# Patient Record
Sex: Male | Born: 1975 | Race: Black or African American | Hispanic: No | Marital: Married | Smoking: Current every day smoker
Health system: Southern US, Community
[De-identification: ages and names within clinical notes are randomized; demographics above are authoritative.]

## PROBLEM LIST (undated history)

## (undated) ENCOUNTER — Emergency Department (HOSPITAL_COMMUNITY): Payer: Medicaid Other

## (undated) ENCOUNTER — Emergency Department (HOSPITAL_COMMUNITY): Admission: EM | Payer: Medicaid Other

## (undated) DIAGNOSIS — F32A Depression, unspecified: Secondary | ICD-10-CM

## (undated) DIAGNOSIS — L97509 Non-pressure chronic ulcer of other part of unspecified foot with unspecified severity: Secondary | ICD-10-CM

## (undated) HISTORY — DX: Non-pressure chronic ulcer of other part of unspecified foot with unspecified severity: L97.509

## (undated) HISTORY — PX: FOOT SURGERY: SHX648

## (undated) HISTORY — DX: Depression, unspecified: F32.A

---

## 2020-07-02 DIAGNOSIS — Z419 Encounter for procedure for purposes other than remedying health state, unspecified: Secondary | ICD-10-CM | POA: Diagnosis not present

## 2020-08-02 DIAGNOSIS — Z419 Encounter for procedure for purposes other than remedying health state, unspecified: Secondary | ICD-10-CM | POA: Diagnosis not present

## 2020-08-28 DIAGNOSIS — Z111 Encounter for screening for respiratory tuberculosis: Secondary | ICD-10-CM | POA: Diagnosis not present

## 2020-08-28 DIAGNOSIS — Z049 Encounter for examination and observation for unspecified reason: Secondary | ICD-10-CM | POA: Diagnosis not present

## 2020-08-28 DIAGNOSIS — Z23 Encounter for immunization: Secondary | ICD-10-CM | POA: Diagnosis not present

## 2020-08-28 DIAGNOSIS — Z114 Encounter for screening for human immunodeficiency virus [HIV]: Secondary | ICD-10-CM | POA: Diagnosis not present

## 2020-09-02 DIAGNOSIS — Z419 Encounter for procedure for purposes other than remedying health state, unspecified: Secondary | ICD-10-CM | POA: Diagnosis not present

## 2020-09-10 DIAGNOSIS — Z130 Encounter for screening for diseases of the blood and blood-forming organs and certain disorders involving the immune mechanism: Secondary | ICD-10-CM | POA: Diagnosis not present

## 2020-09-10 DIAGNOSIS — Z131 Encounter for screening for diabetes mellitus: Secondary | ICD-10-CM | POA: Diagnosis not present

## 2020-09-10 DIAGNOSIS — Z0189 Encounter for other specified special examinations: Secondary | ICD-10-CM | POA: Diagnosis not present

## 2020-09-10 DIAGNOSIS — Z1331 Encounter for screening for depression: Secondary | ICD-10-CM | POA: Diagnosis not present

## 2020-09-10 DIAGNOSIS — L97521 Non-pressure chronic ulcer of other part of left foot limited to breakdown of skin: Secondary | ICD-10-CM | POA: Diagnosis not present

## 2020-09-10 DIAGNOSIS — Z1329 Encounter for screening for other suspected endocrine disorder: Secondary | ICD-10-CM | POA: Diagnosis not present

## 2020-09-10 DIAGNOSIS — Z13220 Encounter for screening for lipoid disorders: Secondary | ICD-10-CM | POA: Diagnosis not present

## 2020-09-10 DIAGNOSIS — Z655 Exposure to disaster, war and other hostilities: Secondary | ICD-10-CM | POA: Diagnosis not present

## 2020-09-10 DIAGNOSIS — Z111 Encounter for screening for respiratory tuberculosis: Secondary | ICD-10-CM | POA: Diagnosis not present

## 2020-09-10 DIAGNOSIS — K2950 Unspecified chronic gastritis without bleeding: Secondary | ICD-10-CM | POA: Diagnosis not present

## 2020-09-30 DIAGNOSIS — Z419 Encounter for procedure for purposes other than remedying health state, unspecified: Secondary | ICD-10-CM | POA: Diagnosis not present

## 2020-10-14 DIAGNOSIS — Z111 Encounter for screening for respiratory tuberculosis: Secondary | ICD-10-CM | POA: Diagnosis not present

## 2020-10-14 DIAGNOSIS — L97521 Non-pressure chronic ulcer of other part of left foot limited to breakdown of skin: Secondary | ICD-10-CM | POA: Diagnosis not present

## 2020-10-14 DIAGNOSIS — R079 Chest pain, unspecified: Secondary | ICD-10-CM | POA: Diagnosis not present

## 2020-10-14 DIAGNOSIS — Z655 Exposure to disaster, war and other hostilities: Secondary | ICD-10-CM | POA: Diagnosis not present

## 2020-10-14 DIAGNOSIS — Z0001 Encounter for general adult medical examination with abnormal findings: Secondary | ICD-10-CM | POA: Diagnosis not present

## 2020-10-31 DIAGNOSIS — Z419 Encounter for procedure for purposes other than remedying health state, unspecified: Secondary | ICD-10-CM | POA: Diagnosis not present

## 2020-11-03 ENCOUNTER — Other Ambulatory Visit: Payer: Self-pay | Admitting: Obstetrics and Gynecology

## 2020-11-03 ENCOUNTER — Ambulatory Visit
Admission: RE | Admit: 2020-11-03 | Discharge: 2020-11-03 | Disposition: A | Discharge status: Home / Self Care | Payer: Self-pay | Source: Ambulatory Visit | Attending: Obstetrics and Gynecology | Admitting: Obstetrics and Gynecology

## 2020-11-03 ENCOUNTER — Other Ambulatory Visit: Payer: Self-pay

## 2020-11-03 DIAGNOSIS — R7612 Nonspecific reaction to cell mediated immunity measurement of gamma interferon antigen response without active tuberculosis: Secondary | ICD-10-CM

## 2020-11-24 ENCOUNTER — Ambulatory Visit
Admission: RE | Admit: 2020-11-24 | Discharge: 2020-11-24 | Disposition: A | Discharge status: Home / Self Care | Payer: No Typology Code available for payment source | Source: Ambulatory Visit | Attending: Obstetrics and Gynecology | Admitting: Obstetrics and Gynecology

## 2020-11-24 ENCOUNTER — Other Ambulatory Visit: Payer: Self-pay | Admitting: Obstetrics and Gynecology

## 2020-11-24 DIAGNOSIS — R7611 Nonspecific reaction to tuberculin skin test without active tuberculosis: Secondary | ICD-10-CM

## 2020-11-30 DIAGNOSIS — Z419 Encounter for procedure for purposes other than remedying health state, unspecified: Secondary | ICD-10-CM | POA: Diagnosis not present

## 2020-12-02 ENCOUNTER — Other Ambulatory Visit: Payer: Self-pay

## 2020-12-02 ENCOUNTER — Encounter (HOSPITAL_COMMUNITY): Payer: Self-pay | Admitting: Emergency Medicine

## 2020-12-02 ENCOUNTER — Ambulatory Visit (HOSPITAL_COMMUNITY)
Admission: EM | Admit: 2020-12-02 | Discharge: 2020-12-02 | Disposition: A | Discharge status: Home / Self Care | Payer: Medicaid Other | Attending: Medical Oncology | Admitting: Medical Oncology

## 2020-12-02 DIAGNOSIS — R1010 Upper abdominal pain, unspecified: Secondary | ICD-10-CM

## 2020-12-02 DIAGNOSIS — R519 Headache, unspecified: Secondary | ICD-10-CM

## 2020-12-02 DIAGNOSIS — R112 Nausea with vomiting, unspecified: Secondary | ICD-10-CM | POA: Diagnosis not present

## 2020-12-02 LAB — POCT URINALYSIS DIPSTICK, ED / UC
Bilirubin Urine: NEGATIVE
Glucose, UA: NEGATIVE mg/dL
Ketones, ur: NEGATIVE mg/dL
Nitrite: NEGATIVE
Protein, ur: 30 mg/dL — AB
Specific Gravity, Urine: 1.015 (ref 1.005–1.030)
Urobilinogen, UA: 0.2 mg/dL (ref 0.0–1.0)
pH: 7 (ref 5.0–8.0)

## 2020-12-02 MED ORDER — ONDANSETRON 4 MG PO TBDP: 4.0000 mg | ORAL_TABLET | Freq: Three times a day (TID) | ORAL | 0 refills | Status: DC | PRN

## 2020-12-02 NOTE — Discharge Instructions (Addendum)
Phillipe : [id number]

## 2020-12-02 NOTE — ED Provider Notes (Signed)
[facility]    CSN: [id number] Arrival date & time: 12/02/20  1214      History   Chief Complaint Chief Complaint  Patient presents with  . Abdominal Pain    HPI [name] is a 45 y.o. male.   HPI Interpretor Phillipe : [id number] assisted today in our visit. Poor historian despite translator use. Oriented x 4  Abdominal Pain: Pt states that for the past 5 days they have had vomiting, headache and epigastric abdominal pain.  He reports that the abdominal pain and vomiting occurs when he tries to eat food.  Any food or liquids that he tries to intake he vomits.  He is unable to give me an exact number of how many times he has vomited since the start of symptoms.  No bloody emesis. He states that he has not had anything to eat so he has not had any bowel movements during this time.  He has had a headache but denies any visual changes or neck stiffness and he has not had any fevers.  He has not tried anything for symptoms other than avoiding juices which make his vomiting worse.   History reviewed. No pertinent past medical history.  There are no problems to display for this patient.   Past Surgical History:  Procedure Laterality Date  . FOOT SURGERY         Home Medications    Prior to Admission medications   Not on File    Family History History reviewed. No pertinent family history.  Social History Social History   Tobacco Use  . Smoking status: Current Every Day Smoker    Types: Cigarettes  . Smokeless tobacco: Never Used  Vaping Use  . Vaping Use: Never used  Substance Use Topics  . Alcohol use: Never  . Drug use: Never     Allergies   Patient has no known allergies.   Review of Systems Review of Systems  As stated above in HPI Physical Exam Triage Vital Signs ED Triage Vitals  Enc Vitals Group     BP 12/02/20 1321 111/67     Pulse Rate 12/02/20 1321 80     Resp 12/02/20 1321 18     Temp 12/02/20 1321 98 F (36.7 C)      Temp Source 12/02/20 1321 Oral     SpO2 12/02/20 1321 100 %     Weight --      Height --      Head Circumference --      Peak Flow --      Pain Score 12/02/20 1326 10     Pain Loc --      Pain Edu? --      Excl. in GC? --    No data found.  Updated Vital Signs BP 111/67 (BP Location: Right Arm)   Pulse 80   Temp 98 F (36.7 C) (Oral)   Resp 18   SpO2 100%   Physical Exam Vitals and nursing note reviewed.  Constitutional:      General: He is not in acute distress.    Appearance: He is well-developed. He is not ill-appearing, toxic-appearing or diaphoretic.  HENT:     Head: Normocephalic and atraumatic.  Eyes:     Extraocular Movements: Extraocular movements intact.     Pupils: Pupils are equal, round, and reactive to light.  Cardiovascular:     Rate and Rhythm: Normal rate and regular rhythm.     Heart sounds: Normal heart  sounds.  Pulmonary:     Effort: Pulmonary effort is normal.     Breath sounds: Normal breath sounds.  Abdominal:     General: Abdomen is flat. Bowel sounds are normal. There is no distension or abdominal bruit.     Palpations: Abdomen is soft. There is no shifting dullness, hepatomegaly, splenomegaly, mass or pulsatile mass.     Tenderness: There is no abdominal tenderness.     Hernia: No hernia is present.  Skin:    Coloration: Skin is not jaundiced.  Neurological:     Mental Status: He is alert and oriented to person, place, and time.     Motor: No weakness.  Psychiatric:        Mood and Affect: Mood normal.        Behavior: Behavior normal.      UC Treatments / Results  Labs (all labs ordered are listed, but only abnormal results are displayed) Labs Reviewed  POCT URINALYSIS DIPSTICK, ED / UC - Abnormal; Notable for the following components:      Result Value   Hgb urine dipstick MODERATE (*)    Protein, ur 30 (*)    Leukocytes,Ua TRACE (*)    All other components within normal limits    EKG   Radiology No results  found.  Procedures Procedures (including critical care time)  Medications Ordered in UC Medications - No data to display  Initial Impression / Assessment and Plan / UC Course  I have reviewed the triage vital signs and the nursing notes.  Pertinent labs & imaging results that were available during my care of the patient were reviewed by me and considered in my medical decision making (see chart for details).      New.  I discussed my concern with his symptoms.  We have elected to trial Zofran and hydration with water to see if this would help with symptoms.  We discussed that should this fail to improve his symptoms fully he will need to go to the emergency room. At the end of our visit he asks about where he can get evaluated for foot pain that has been chronic for years. I have recommended a podiatrist.  Final Clinical Impressions(s) / UC Diagnoses   Final diagnoses:  None   Discharge Instructions   None    ED Prescriptions    None     PDMP not reviewed this encounter.   Rushie Chestnut, [place] 12/02/20 1455

## 2020-12-02 NOTE — ED Triage Notes (Signed)
complains of vomiting , headache and abdominal pain.  Symptoms started friday

## 2020-12-23 DIAGNOSIS — M79672 Pain in left foot: Secondary | ICD-10-CM | POA: Diagnosis not present

## 2020-12-23 DIAGNOSIS — Z789 Other specified health status: Secondary | ICD-10-CM | POA: Diagnosis not present

## 2020-12-23 DIAGNOSIS — L97521 Non-pressure chronic ulcer of other part of left foot limited to breakdown of skin: Secondary | ICD-10-CM | POA: Diagnosis not present

## 2020-12-31 DIAGNOSIS — Z419 Encounter for procedure for purposes other than remedying health state, unspecified: Secondary | ICD-10-CM | POA: Diagnosis not present

## 2021-01-29 ENCOUNTER — Ambulatory Visit (INDEPENDENT_AMBULATORY_CARE_PROVIDER_SITE_OTHER): Payer: Medicaid Other

## 2021-01-29 ENCOUNTER — Ambulatory Visit (HOSPITAL_COMMUNITY)
Admission: EM | Admit: 2021-01-29 | Discharge: 2021-01-29 | Disposition: A | Discharge status: Home / Self Care | Payer: Medicaid Other | Attending: Urgent Care | Admitting: Urgent Care

## 2021-01-29 ENCOUNTER — Other Ambulatory Visit: Payer: Self-pay

## 2021-01-29 ENCOUNTER — Encounter (HOSPITAL_COMMUNITY): Payer: Self-pay

## 2021-01-29 DIAGNOSIS — M25572 Pain in left ankle and joints of left foot: Secondary | ICD-10-CM

## 2021-01-29 DIAGNOSIS — S91002A Unspecified open wound, left ankle, initial encounter: Secondary | ICD-10-CM | POA: Diagnosis not present

## 2021-01-29 DIAGNOSIS — M7989 Other specified soft tissue disorders: Secondary | ICD-10-CM | POA: Diagnosis not present

## 2021-01-29 DIAGNOSIS — M79672 Pain in left foot: Secondary | ICD-10-CM

## 2021-01-29 MED ORDER — NAPROXEN 375 MG PO TABS: 375.0000 mg | ORAL_TABLET | Freq: Two times a day (BID) | ORAL | 0 refills | Status: DC

## 2021-01-29 MED ORDER — BACITRACIN ZINC 500 UNIT/GM EX OINT: 1.0000 "application " | TOPICAL_OINTMENT | Freq: Two times a day (BID) | CUTANEOUS | 0 refills | Status: DC
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
## 2021-01-29 NOTE — ED Triage Notes (Signed)
Pt presents with recurring left foot & ankle swelling, pain, and wound draining X 4 months.

## 2021-01-29 NOTE — Discharge Instructions (Addendum)
Please change your dressing 2-3 times daily. Use non-stick gauze (non-adherent gauze). Alternate between wet (applying the Bacitracin ointment) and dry dressings (no ointments). Each time you change your dressing, make sure you clean gently around the perimeter of the wound and the wound itself with gentle soap and warm water. Dial antibacterial soap is okay to use. Pat your wound dry and let it air out if possible for 1-2 hours before reapplying another dressing. Once your wound scabs over completely, you do not need to keep applying dressings.

## 2021-01-30 DIAGNOSIS — Z419 Encounter for procedure for purposes other than remedying health state, unspecified: Secondary | ICD-10-CM | POA: Diagnosis not present

## 2021-02-05 ENCOUNTER — Ambulatory Visit (HOSPITAL_COMMUNITY)
Admission: RE | Admit: 2021-02-05 | Discharge: 2021-02-05 | Disposition: A | Discharge status: Home / Self Care | Payer: Medicaid Other | Source: Ambulatory Visit | Attending: Internal Medicine | Admitting: Internal Medicine

## 2021-02-05 ENCOUNTER — Encounter (HOSPITAL_BASED_OUTPATIENT_CLINIC_OR_DEPARTMENT_OTHER): Payer: Medicaid Other | Attending: Internal Medicine | Admitting: Internal Medicine

## 2021-02-05 ENCOUNTER — Other Ambulatory Visit (HOSPITAL_COMMUNITY)
Admission: RE | Admit: 2021-02-05 | Discharge: 2021-02-05 | Disposition: A | Discharge status: Home / Self Care | Payer: Medicaid Other | Attending: Internal Medicine | Admitting: Internal Medicine

## 2021-02-05 ENCOUNTER — Other Ambulatory Visit: Payer: Self-pay

## 2021-02-05 ENCOUNTER — Other Ambulatory Visit (HOSPITAL_COMMUNITY): Payer: Self-pay | Admitting: Internal Medicine

## 2021-02-05 DIAGNOSIS — L03116 Cellulitis of left lower limb: Secondary | ICD-10-CM | POA: Insufficient documentation

## 2021-02-05 DIAGNOSIS — L97328 Non-pressure chronic ulcer of left ankle with other specified severity: Secondary | ICD-10-CM | POA: Insufficient documentation

## 2021-02-05 DIAGNOSIS — I87332 Chronic venous hypertension (idiopathic) with ulcer and inflammation of left lower extremity: Secondary | ICD-10-CM | POA: Insufficient documentation

## 2021-02-05 DIAGNOSIS — R600 Localized edema: Secondary | ICD-10-CM | POA: Diagnosis not present

## 2021-02-05 DIAGNOSIS — Z872 Personal history of diseases of the skin and subcutaneous tissue: Secondary | ICD-10-CM | POA: Diagnosis not present

## 2021-02-05 DIAGNOSIS — L97322 Non-pressure chronic ulcer of left ankle with fat layer exposed: Secondary | ICD-10-CM | POA: Diagnosis not present

## 2021-02-05 DIAGNOSIS — M19072 Primary osteoarthritis, left ankle and foot: Secondary | ICD-10-CM | POA: Diagnosis not present

## 2021-02-05 NOTE — Progress Notes (Signed)
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 interpreter. I reviewed his x-ray there is no hardware. 3. I also wondered about a [place] ulcer although this seems to have worsened markedly since he was here. 4. I have cultured the wound surface and put him empirically on antibiotics. I have ordered arterial and venous Dopplers. The patient asked me about cancer although that was not my first thought here. I think this is much more likely to be an infection 5. We had a lot of trouble communicating here even through the interpreter. The patient seems to have his own fixed ideas about what should and should not be done to this wound. We use silver alginate's a tube and ABDs and  put him in 3 layer compression we will have him back for a nurse visit early next week I spent 50 minutes in review of this patient's past medical history, face-to-face evaluation and preparation of this record Electronic Signature(s) Signed: 02/05/2021 8:35:52 PM By: Baltazar Najjar MD Entered By: Baltazar Najjar on 02/05/2021 12:45:21 -------------------------------------------------------------------------------- HxROS Details Patient Name: Date of Service: [name] [name] 02/05/2021 9:00 A M Medical Record Number: [id number] Patient Account Number: [id number] Date of Birth/Sex: Treating RN: 11-01-1975 (45 y.o. Male) [name] Primary Care Provider: PCP, NO Other Clinician: Referring Provider: Treating Provider/Extender: [name] in Treatment: 0 Information Obtained From Patient Eyes Complaints and Symptoms: Negative for: Dry Eyes; Vision Changes; Glasses / Contacts Ear/Nose/Mouth/Throat Complaints and Symptoms: Negative for: Chronic sinus problems or rhinitis Respiratory Complaints and Symptoms: Negative for: Chronic or frequent coughs; Shortness of Breath Cardiovascular Complaints and Symptoms: Negative for: Chest pain Endocrine Complaints and Symptoms: Negative for: Heat/cold intolerance Genitourinary Complaints and Symptoms: Negative for: Frequent urination Integumentary (Skin) Complaints and Symptoms: Positive for: Wounds Musculoskeletal Complaints and Symptoms: Negative for: Muscle Pain; Muscle Weakness Neurologic Complaints and Symptoms: Negative for: Numbness/parasthesias Psychiatric Complaints and Symptoms: Negative for: Claustrophobia; Suicidal Hematologic/Lymphatic Gastrointestinal Medical History: Past Medical History Notes: Chronic Gastritis Immunological Oncologic Immunizations Pneumococcal Vaccine: Received Pneumococcal Vaccination: No Implantable Devices No devices added Family and Social History Unknown  History: Yes; Current every day smoker; Marital Status - Married; Alcohol Use: Rarely; Drug Use: No History; Caffeine Use: Moderate; Financial Concerns: No; Food, Clothing or Shelter Needs: No; Support System Lacking: No; Transportation Concerns: No Electronic Signature(s) Signed: 02/05/2021 5:53:02 PM By: [name] Signed: 02/05/2021 8:35:52 PM By: Baltazar Najjar MD Signed: 02/05/2021 8:35:52 PM By: Baltazar Najjar MD Entered By: [name] on 02/05/2021 10:12:05 -------------------------------------------------------------------------------- SuperBill Details Patient Name: Date of Service: [name] [name] 02/05/2021 Medical Record Number: [id number] Patient Account Number: [id number] Date of Birth/Sex: Treating RN: 1976-01-27 (45 y.o. Male) Shawn Stall Primary Care Provider: PCP, NO Other Clinician: Referring Provider: Treating Provider/Extender: [name] in Treatment: 0 Diagnosis Coding ICD-10 Codes Code Description [phone] Non-pressure chronic ulcer of left ankle with other specified severity L03.116 Cellulitis of left lower limb I87.332 Chronic venous hypertension (idiopathic) with ulcer and inflammation of left lower extremity Facility Procedures CPT4 Code: 32992426 Description: 99213 - WOUND CARE VISIT-LEV 3 EST PT Modifier: Quantity: 1 CPT4 Code: 83419622 Description: 11042 - DEB SUBQ TISSUE 20 SQ CM/< ICD-10 Diagnosis Description L97.328 Non-pressure chronic ulcer of left ankle with  other specified severity Modifier: Quantity: 1 CPT4 Code: [id number] Description: 11045 - DEB SUBQ TISS EA ADDL 20CM ICD-10 Diagnosis Description L97.328 Non-pressure chronic ulcer of left ankle with other specified severity Modifier: Quantity: 3 Physician Procedures : CPT4 Code Description Modifier [id number] 99204 - WC PHYS LEVEL 4 - NEW PT 25 ICD-10 Diagnosis Description L97.328 Non-pressure chronic ulcer of left ankle with other specified severity L03.116  Cellulitis of left lower limb I87.332 Chronic venous  hypertension (idiopathic) with ulcer and inflammation of left lower extremity Quantity: 1 : [id number] 11042 - WC PHYS SUBQ TISS 20 SQ CM ICD-10 Diagnosis Description L97.328 Non-pressure chronic ulcer of left ankle with other specified severity Quantity: 1 : [id number] 11045 - WC PHYS SUBQ TISS EA ADDL 20 CM ICD-10 Diagnosis Description L97.328 Non-pressure chronic ulcer of left ankle with other specified severity Quantity: 3 Electronic Signature(s) Signed: 02/05/2021 6:24:11 PM By: Shawn Stalleaton, Bobbi Signed: 02/05/2021 8:35:52 PM By: Baltazar Najjarobson, Yaw Escoto MD Entered By: Shawn Stalleaton, Bobbi on 02/05/2021 15:46:38

## 2021-02-05 NOTE — Progress Notes (Signed)
[name], [name] ([id number]) Visit Report for 02/05/2021 Abuse/Suicide Risk Screen Details Patient Name: Date of Service: [name] [name] [place] NIE 02/05/2021 9:00 A M Medical Record Number: [id number] Patient Account Number: [id number] Date of Birth/Sex: Treating RN: 1976/07/15 (45 y.o. Male) [name] Primary Care [name]: PCP, NO Other Clinician: Referring [name]: Treating [name]: [name] in Treatment: 0 Abuse/Suicide Risk Screen Items Answer ABUSE RISK SCREEN: Has anyone close to you tried to hurt or harm you recentlyo No Do you feel uncomfortable with anyone in your familyo No Has anyone forced you do things that you didnt want to doo No Electronic Signature(s) Signed: 02/05/2021 5:53:02 PM By: [name] Entered By: [name] on 02/05/2021 10:02:27 -------------------------------------------------------------------------------- Activities of Daily Living Details Patient Name: Date of Service: [name] [name] [name] 02/05/2021 9:00 A M Medical Record Number: [id number] Patient Account Number: [id number] Date of Birth/Sex: Treating RN: [birth year] (45 y.o. Male) [name] Primary Care [name]: PCP, NO Other Clinician: Referring [name]: Treating [name]/Extender: [name] in Treatment: 0 Activities of Daily Living Items Answer Activities of Daily Living (Please select one for each item) Drive Automobile Not Able T Medications ake Completely Able Use T elephone Completely Able Care for Appearance Completely Able Use T oilet Completely Able Bath / Shower Completely Able Dress Self Completely Able Feed Self Completely Able Walk Completely Able Get In / Out Bed Completely Able Housework Completely Able Prepare Meals Completely Able Handle Money Completely Able Shop for Self Completely Able Electronic Signature(s) Signed: 02/05/2021 5:53:02 PM By: [name] Entered By: [name] on  02/05/2021 10:13:30 -------------------------------------------------------------------------------- Education Screening Details Patient Name: Date of Service: [name] [name] [name] 02/05/2021 9:00 A M Medical Record Number: [id number] Patient Account Number: [id number] Date of Birth/Sex: Treating RN: [birth year] (45 y.o. Male) [name] Primary Care [name]: PCP, NO Other Clinician: Referring [name]: Treating [name]/Extender: [name] in Treatment: 0 Primary Learner Assessed: Patient Learning Preferences/Education Level/Primary Language Learning Preference: Explanation Preferred Language: Other: [id number] Cognitive Barrier Language Barrier: Yes Translator Needed: Yes Engineer, maintenance Deficit: No Emotional Barrier: No Cultural/Religious Beliefs Affecting Medical Care: No Physical Barrier Impaired Vision: No Impaired Hearing: No Decreased Hand dexterity: No Knowledge/Comprehension Knowledge Level: Medium Comprehension Level: Medium Ability to understand written instructions: Low Ability to understand verbal instructions: Medium Motivation Anxiety Level: Calm Cooperation: Cooperative Education Importance: Acknowledges Need Interest in Health Problems: Asks Questions Perception: Coherent Willingness to Engage in Self-Management Medium Activities: Readiness to Engage in Self-Management Medium Activities: Electronic Signature(s) Signed: 02/05/2021 5:53:02 PM By: [name] Entered By: [name] on 02/05/2021 10:14:46 -------------------------------------------------------------------------------- Fall Risk Assessment Details Patient Name: Date of Service: [name] [name], [name] 02/05/2021 9:00 A M Medical Record Number: [id number] Patient Account Number: [id number] Date of Birth/Sex: Treating RN: [birth year] (45 y.o. Male) [name] Primary Care [name]: PCP, NO Other Clinician: Referring  [name]: Treating [name]/Extender: [name] in Treatment: 0 Fall Risk Assessment Items Have you had 2 or more falls in the last 12 monthso 0 No Have you had any fall that resulted in injury in the last 12 monthso 0 No FALLS RISK SCREEN History of falling - immediate or within 3 months 0 No Secondary diagnosis (Do you have 2 or more medical diagnoseso) 0 No Ambulatory aid None/bed rest/wheelchair/nurse 0 Yes Crutches/cane/walker 0 No Furniture 0 No Intravenous therapy Access/Saline/Heparin Lock 0 No Gait/Transferring Normal/ bed rest/ wheelchair 0 Yes Weak (short steps with or without shuffle,  stooped but able to lift head while walking, may seek 0 No support from furniture) Impaired (short steps with shuffle, may have difficulty arising from chair, head down, impaired 0 No balance) Mental Status Oriented to own ability 0 Yes Electronic Signature(s) Signed: 02/05/2021 5:53:02 PM By: [name] Entered By: [name] on 02/05/2021 10:16:02 -------------------------------------------------------------------------------- Foot Assessment Details Patient Name: Date of Service: [name] [name] [name] 02/05/2021 9:00 A M Medical Record Number: [id number] Patient Account Number: [id number] Date of Birth/Sex: Treating RN: 07/20/1976 (45 y.o. Male) [name] Primary Care [name]: PCP, NO Other Clinician: Referring [name]: Treating [name]/Extender: [name] in Treatment: 0 Foot Assessment Items Site Locations + = Sensation present, - = Sensation absent, C = Callus, U = Ulcer R = Redness, W = Warmth, M = Maceration, PU = Pre-ulcerative lesion F = Fissure, S = Swelling, D = Dryness Assessment Right: Left: Other Deformity: No No Prior Foot Ulcer: No No Prior Amputation: No No Charcot Joint: No No Ambulatory Status: Ambulatory Without Help Gait: Steady Electronic Signature(s) Signed: 02/05/2021 5:53:02 PM By:  [name] Entered By: [name] on 02/05/2021 10:26:19 -------------------------------------------------------------------------------- Nutrition Risk Screening Details Patient Name: Date of Service: [name] [name] [name] 02/05/2021 9:00 A M Medical Record Number: [id number] Patient Account Number: [id number] Date of Birth/Sex: Treating RN: 1975/09/03 (45 y.o. Male) [name] Primary Care Lexee Brashears: PCP, NO Other Clinician: Referring Vere Diantonio: Treating Nelwyn Hebdon/Extender: [name] in Treatment: 0 Height (in): Weight (lbs): Body Mass Index (BMI): Nutrition Risk Screening Items Score Screening NUTRITION RISK SCREEN: I have an illness or condition that made me change the kind and/or amount of food I eat 0 No I eat fewer than two meals per day 0 No I eat few fruits and vegetables, or milk products 0 No I have three or more drinks of beer, liquor or wine almost every day 0 No I have tooth or mouth problems that make it hard for me to eat 0 No I don't always have enough money to buy the food I need 0 No I eat alone most of the time 0 No I take three or more different prescribed or over-the-counter drugs a day 0 No Without wanting to, I have lost or gained 10 pounds in the last six months 0 No I am not always physically able to shop, cook and/or feed myself 0 No Nutrition Protocols Good Risk Protocol 0 No interventions needed Moderate Risk Protocol High Risk Proctocol Risk Level: Good Risk Score: 0 Electronic Signature(s) Signed: 02/05/2021 5:53:02 PM By: [name] Entered By: [name] on 02/05/2021 10:16:42

## 2021-02-05 NOTE — Progress Notes (Addendum)
MARTIN, BELLING ([id number]) Visit Report for 02/05/2021 Allergy List Details Patient Name: Date of Service: [name] [place] 02/05/2021 9:00 A M Medical Record Number: [id number] Patient Account Number: [id number] Date of Birth/Sex: Treating RN: 1976/02/08 (45 y.o. Male) Lorrin Jackson Primary Care Monik Lins: PCP, NO Other Clinician: Referring Mallarie Voorhies: Treating Rether Rison/Extender: Milinda Pointer in Treatment: 0 Allergies Active Allergies No Known Allergies Allergy Notes Electronic Signature(s) Signed: 02/05/2021 5:53:02 PM By: Lorrin Jackson Entered By: Lorrin Jackson on 02/05/2021 10:06:05 -------------------------------------------------------------------------------- Arrival Information Details Patient Name: Date of Service: [name] NA [place] 02/05/2021 9:00 A M Medical Record Number: [id number] Patient Account Number: [id number] Date of Birth/Sex: Treating RN: July 13, 1976 (45 y.o. Male) Lorrin Jackson Primary Care Karryn Kosinski: PCP, NO Other Clinician: Referring Derelle Cockrell: Treating Felisia Balcom/Extender: Milinda Pointer in Treatment: 0 Visit Information Patient Arrived: Ambulatory Arrival Time: 09:46 Accompanied By: Case manager Transfer Assistance: None Patient Identification Verified: Yes Secondary Verification Process Completed: Yes Patient Requires Transmission-Based Precautions: No Patient Has Alerts: No Electronic Signature(s) Signed: 02/05/2021 5:53:02 PM By: Lorrin Jackson Entered By: Lorrin Jackson on 02/05/2021 09:57:17 -------------------------------------------------------------------------------- Clinic Level of Care Assessment Details Patient Name: Date of Service: [name] NA [place] 02/05/2021 9:00 A M Medical Record Number: [id number] Patient Account Number: [id number] Date of Birth/Sex: Treating RN: [birth year] (45 y.o. Male) Deon Pilling Primary Care Londynn Sonoda: PCP, NO Other Clinician: Referring Alayssa Flinchum: Treating  Anthonella Klausner/Extender: Milinda Pointer in Treatment: 0 Clinic Level of Care Assessment Items TOOL 1 Quantity Score X- 1 0 Use when EandM and Procedure is performed on INITIAL visit ASSESSMENTS - Nursing Assessment / Reassessment X- 1 20 General Physical Exam (combine w/ comprehensive assessment (listed just below) when performed on new pt. evals) X- 1 25 Comprehensive Assessment (HX, ROS, Risk Assessments, Wounds Hx, etc.) ASSESSMENTS - Wound and Skin Assessment / Reassessment X- 1 10 Dermatologic / Skin Assessment (not related to wound area) ASSESSMENTS - Ostomy and/or Continence Assessment and Care []  - 0 Incontinence Assessment and Management []  - 0 Ostomy Care Assessment and Management (repouching, etc.) PROCESS - Coordination of Care []  - 0 Simple Patient / Family Education for ongoing care X- 1 20 Complex (extensive) Patient / Family Education for ongoing care X- 1 10 Staff obtains Programmer, systems, Records, T Results / Process Orders est []  - 0 Staff telephones HHA, Nursing Homes / Clarify orders / etc []  - 0 Routine Transfer to another Facility (non-emergent condition) []  - 0 Routine Hospital Admission (non-emergent condition) X- 1 15 New Admissions / Biomedical engineer / Ordering NPWT Apligraf, etc. , []  - 0 Emergency Hospital Admission (emergent condition) PROCESS - Special Needs []  - 0 Pediatric / Minor Patient Management []  - 0 Isolation Patient Management X- 1 15 Hearing / Language / Visual special needs []  - 0 Assessment of Community assistance (transportation, D/C planning, etc.) []  - 0 Additional assistance / Altered mentation []  - 0 Support Surface(s) Assessment (bed, cushion, seat, etc.) INTERVENTIONS - Miscellaneous []  - 0 External ear exam []  - 0 Patient Transfer (multiple staff / Civil Service fast streamer / Similar devices) []  - 0 Simple Staple / Suture removal (25 or less) []  - 0 Complex Staple / Suture removal (26 or more) []  -  0 Hypo/Hyperglycemic Management (do not check if billed separately) []  - 0 Ankle / Brachial Index (ABI) - do not check if billed separately Has the patient been seen at the hospital within the last three years: Yes Total Score: 115 Level Of Care: New/Established -  Level 3 Electronic Signature(s) Signed: 02/05/2021 6:24:11 PM By: Deon Pilling Entered By: Deon Pilling on 02/05/2021 11:31:31 -------------------------------------------------------------------------------- Compression Therapy Details Patient Name: Date of Service: [name] NA [place] 02/05/2021 9:00 A M Medical Record Number: [id number] Patient Account Number: [id number] Date of Birth/Sex: Treating RN: 03-22-1976 (45 y.o. Male) Deon Pilling Primary Care Kalia Vahey: PCP, NO Other Clinician: Referring Jewel Venditto: Treating Tarren Velardi/Extender: Milinda Pointer in Treatment: 0 Compression Therapy Performed for Wound Assessment: Wound #1 Left Ankle Performed By: Clinician Rhae Hammock, RN Compression Type: Three Layer Post Procedure Diagnosis Same as Pre-procedure Electronic Signature(s) Signed: 02/05/2021 6:24:11 PM By: Deon Pilling Entered By: Deon Pilling on 02/05/2021 11:30:13 -------------------------------------------------------------------------------- Lower Extremity Assessment Details Patient Name: Date of Service: [name] NA [place] 02/05/2021 9:00 A M Medical Record Number: [id number] Patient Account Number: [id number] Date of Birth/Sex: Treating RN: 05-Mar-1976 (45 y.o. Male) Lorrin Jackson Primary Care Leshaun Biebel: PCP, NO Other Clinician: Referring Marquie Aderhold: Treating Lyrick Worland/Extender: Milinda Pointer in Treatment: 0 Edema Assessment Assessed: Shirlyn Goltz: No] [name]: Yes] Edema: [Left: N] [Right: o] Calf Left: Right: Point of Measurement: 28 cm From Medial Instep 32 cm Ankle Left: Right: Point of Measurement: 8 cm From Medial Instep 24 cm Vascular  Assessment Pulses: Dorsalis Pedis Palpable: [Right:No Yes] Notes Unable to tolerate d/t pain Electronic Signature(s) Signed: 02/05/2021 5:53:02 PM By: Lorrin Jackson Entered By: Lorrin Jackson on 02/05/2021 10:31:16 -------------------------------------------------------------------------------- Multi Wound Chart Details Patient Name: Date of Service: [name] [name], [name] [place] 02/05/2021 9:00 A M Medical Record Number: [id number] Patient Account Number: [id number] Date of Birth/Sex: Treating RN: Dec 18, 1975 (46 y.o. Male) Deon Pilling Primary Care Braiden Presutti: PCP, NO Other Clinician: Referring Pailynn Vahey: Treating Krosby Ritchie/Extender: Milinda Pointer in Treatment: 0 Vital Signs Height(in): Pulse(bpm): 3 Weight(lbs): Blood Pressure(mmHg): 118/72 Body Mass Index(BMI): Temperature(F): 98 Respiratory Rate(breaths/min): 16 Photos: [1:No Photos Left Ankle] [N/A:N/A N/A] Wound Location: [1:Trauma] [N/A:N/A] Wounding Event: [1:Trauma, Other] [N/A:N/A] Primary Etiology: [1:10/14/2020] [N/A:N/A] Date Acquired: [1:0] [N/A:N/A] Weeks of Treatment: [1:Open] [N/A:N/A] Wound Status: [1:7x11.2x0.4] [N/A:N/A] Measurements L x W x D (cm) [1:61.575] [N/A:N/A] A (cm) : rea [1:24.63] [N/A:N/A] Volume (cm) : [1:Full Thickness Without Exposed] [N/A:N/A] Classification: [1:Support Structures Medium] [N/A:N/A] Exudate A mount: [1:Serosanguineous] [N/A:N/A] Exudate Type: [1:red, brown] [N/A:N/A] Exudate Color: [1:Well defined, not attached] [N/A:N/A] Wound Margin: [1:Medium (34-66%)] [N/A:N/A] Granulation A mount: [1:Red] [N/A:N/A] Granulation Quality: [1:Medium (34-66%)] [N/A:N/A] Necrotic A mount: [1:Fat Layer (Subcutaneous Tissue): Yes N/A] Exposed Structures: [1:Fascia: No Tendon: No Muscle: No Joint: No Bone: No None] [N/A:N/A] Epithelialization: [1:Debridement - Excisional] [N/A:N/A] Debridement: Pre-procedure Verification/Time Out 11:15 [N/A:N/A] Taken: [1:Lidocaine 4%  Topical Solution] [N/A:N/A] Pain Control: [1:Subcutaneous, Slough] [N/A:N/A] Tissue Debrided: [1:Skin/Subcutaneous Tissue] [N/A:N/A] Level: [1:78.4] [N/A:N/A] Debridement A (sq cm): [1:rea Curette] [N/A:N/A] Instrument: [1:Minimum] [N/A:N/A] Bleeding: [1:Pressure] [N/A:N/A] Hemostasis A chieved: [1:0] [N/A:N/A] Procedural Pain: [1:2] [N/A:N/A] Post Procedural Pain: [1:Procedure was tolerated well] [N/A:N/A] Debridement Treatment Response: [1:7x11.2x0.4] [N/A:N/A] Post Debridement Measurements L x W x D (cm) [1:24.63] [N/A:N/A] Post Debridement Volume: (cm) [1:Compression Therapy] [N/A:N/A] Procedures Performed: [1:Debridement] Treatment Notes Electronic Signature(s) Signed: 02/05/2021 6:24:11 PM By: Deon Pilling Signed: 02/05/2021 8:35:52 PM By: Linton Ham MD Entered By: Linton Ham on 02/05/2021 12:25:02 -------------------------------------------------------------------------------- Multi-Disciplinary Care Plan Details Patient Name: Date of Service: [name] [name] 02/05/2021 9:00 A M Medical Record Number: [id number] Patient Account Number: [id number] Date of Birth/Sex: Treating RN: Jan 24, 1976 (45 y.o. Male) Deon Pilling Primary Care Ming Mcmannis: PCP, NO Other Clinician: Referring Lynk Marti: Treating Rilan Eiland/Extender: Milinda Pointer in  Treatment: 0 Active Inactive Orientation to the Wound Care Program Nursing Diagnoses: Knowledge deficit related to the wound healing center program Goals: Patient/caregiver will verbalize understanding of the [place] Date Initiated: 02/05/2021 Target Resolution Date: 03/06/2021 Goal Status: Active Interventions: Provide education on orientation to the wound center Notes: Pain, Acute or Chronic Nursing Diagnoses: Pain, acute or chronic: actual or potential Potential alteration in comfort, pain Goals: Patient will verbalize adequate pain control and receive pain control interventions during  procedures as needed Date Initiated: 02/05/2021 Target Resolution Date: 03/05/2021 Goal Status: Active Patient/caregiver will verbalize comfort level met Date Initiated: 02/05/2021 Target Resolution Date: 03/04/2021 Goal Status: Active Interventions: Encourage patient to take pain medications as prescribed Provide education on pain management Reposition patient for comfort Treatment Activities: Administer pain control measures as ordered : 02/05/2021 Notes: Wound/Skin Impairment Nursing Diagnoses: Knowledge deficit related to ulceration/compromised skin integrity Goals: Patient/caregiver will verbalize understanding of skin care regimen Date Initiated: 02/05/2021 Target Resolution Date: 03/05/2021 Goal Status: Active Interventions: Assess patient/caregiver ability to obtain necessary supplies Assess patient/caregiver ability to perform ulcer/skin care regimen upon admission and as needed Provide education on ulcer and skin care Treatment Activities: Skin care regimen initiated : 02/05/2021 Topical wound management initiated : 02/05/2021 Notes: Electronic Signature(s) Signed: 02/05/2021 6:24:11 PM By: Deon Pilling Entered By: Deon Pilling on 02/05/2021 11:11:49 -------------------------------------------------------------------------------- Pain Assessment Details Patient Name: Date of Service: [name] NA [place] 02/05/2021 9:00 A M Medical Record Number: [id number] Patient Account Number: [id number] Date of Birth/Sex: Treating RN: 02/25/1976 (45 y.o. Male) Lorrin Jackson Primary Care Javed Cotto: PCP, NO Other Clinician: Referring Teyla Skidgel: Treating Spence Soberano/Extender: Milinda Pointer in Treatment: 0 Active Problems Location of Pain Severity and Description of Pain Patient Has Paino Yes Site Locations Pain Location: Pain in Ulcers With Dressing Change: Yes Duration of the Pain. Constant / Intermittento Intermittent Rate the pain. Current Pain Level: 10 Character  of Pain Describe the Pain: Tender, Throbbing Pain Management and Medication Current Pain Management: Medication: Yes Cold Application: No Rest: Yes Massage: No Activity: No T.E.N.S.: No Heat Application: No Leg drop or elevation: No Is the Current Pain Management Adequate: Inadequate How does your wound impact your activities of daily livingo Sleep: Yes Bathing: No Appetite: No Relationship With Others: No Bladder Continence: No Emotions: No Bowel Continence: No Work: No Toileting: No Drive: No Dressing: No Hobbies: No Electronic Signature(s) Signed: 02/05/2021 5:53:02 PM By: Lorrin Jackson Entered By: Lorrin Jackson on 02/05/2021 10:38:04 -------------------------------------------------------------------------------- Patient/Caregiver Education Details Patient Name: Date of Service: [name] NA [place] 7/7/2022andnbsp9:00 [place] Record Number: [id number] Patient Account Number: [id number] Date of Birth/Gender: Treating RN: [birth year] (45 y.o. Male) Deon Pilling Primary Care Physician: PCP, NO Other Clinician: Referring Physician: Treating Physician/Extender: Milinda Pointer in Treatment: 0 Education Assessment Education Provided To: Patient Education Topics Provided Welcome T [facility]: o Handouts: Welcome T [facility] o Methods: Demonstration, Explain/Verbal Responses: Reinforcements needed Electronic Signature(s) Signed: 02/05/2021 6:24:11 PM By: Deon Pilling Entered By: Deon Pilling on 02/05/2021 11:12:21 -------------------------------------------------------------------------------- Wound Assessment Details Patient Name: Date of Service: [name] NA [place] 02/05/2021 9:00 A M Medical Record Number: [id number] Patient Account Number: [id number] Date of Birth/Sex: Treating RN: 18-Aug-1975 (45 y.o. Male) Lorrin Jackson Primary Care Graviel Payeur: PCP, NO Other Clinician: Referring Nai Dasch: Treating  Delfin Squillace/Extender: Milinda Pointer in Treatment: 0 Wound Status Wound Number: 1 Primary Etiology: Trauma, Other Wound Location: Left Ankle Wound Status: Open Wounding  Event: Trauma Date Acquired: 10/14/2020 Weeks Of Treatment: 0 Clustered Wound: No Photos Wound Measurements Length: (cm) 7 Width: (cm) 11.2 Depth: (cm) 0.4 Area: (cm) 61.575 Volume: (cm) 24.63 % Reduction in Area: 0% % Reduction in Volume: 0% Epithelialization: None Tunneling: No Undermining: No Wound Description Classification: Full Thickness Without Exposed Support Structures Wound Margin: Well defined, not attached Exudate Amount: Medium Exudate Type: Serosanguineous Exudate Color: red, brown Foul Odor After Cleansing: No Slough/Fibrino Yes Wound Bed Granulation Amount: Medium (34-66%) Exposed Structure Granulation Quality: Red Fascia Exposed: No Necrotic Amount: Medium (34-66%) Fat Layer (Subcutaneous Tissue) Exposed: Yes Necrotic Quality: Adherent Slough Tendon Exposed: No Muscle Exposed: No Joint Exposed: No Bone Exposed: No Electronic Signature(s) Signed: 02/06/2021 5:16:50 PM By: Sandre Kitty Signed: 02/06/2021 5:49:30 PM By: Lorrin Jackson Previous Signature: 02/05/2021 5:53:02 PM Version By: Lorrin Jackson Entered By: Sandre Kitty on 02/06/2021 14:56:55 -------------------------------------------------------------------------------- [place] Details Patient Name: Date of Service: [name] [name], [name] 02/05/2021 9:00 A M Medical Record Number: [id number] Patient Account Number: [id number] Date of Birth/Sex: Treating RN: 1976/07/24 (45 y.o. Male) Lorrin Jackson Primary Care Wyolene Weimann: PCP, NO Other Clinician: Referring Keta [place]: Treating Nayda Riesen/Extender: Milinda Pointer in Treatment: 0 Vital Signs Time Taken: 09:57 Temperature (F): 98 Pulse (bpm): 57 Respiratory Rate (breaths/min): 16 Blood Pressure (mmHg): 118/72 Reference Range: 80 - 120  mg / dl Electronic Signature(s) Signed: 02/05/2021 5:53:02 PM By: Lorrin Jackson Entered By: Lorrin Jackson on 02/05/2021 10:05:59

## 2021-02-08 LAB — AEROBIC CULTURE W GRAM STAIN (SUPERFICIAL SPECIMEN)

## 2021-02-11 ENCOUNTER — Other Ambulatory Visit: Payer: Self-pay

## 2021-02-11 ENCOUNTER — Encounter (HOSPITAL_BASED_OUTPATIENT_CLINIC_OR_DEPARTMENT_OTHER): Payer: Medicaid Other | Admitting: Internal Medicine

## 2021-02-11 DIAGNOSIS — I87332 Chronic venous hypertension (idiopathic) with ulcer and inflammation of left lower extremity: Secondary | ICD-10-CM | POA: Diagnosis not present

## 2021-02-11 DIAGNOSIS — L97328 Non-pressure chronic ulcer of left ankle with other specified severity: Secondary | ICD-10-CM | POA: Diagnosis not present

## 2021-02-11 DIAGNOSIS — L97322 Non-pressure chronic ulcer of left ankle with fat layer exposed: Secondary | ICD-10-CM | POA: Diagnosis not present

## 2021-02-11 DIAGNOSIS — L03116 Cellulitis of left lower limb: Secondary | ICD-10-CM | POA: Diagnosis not present
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
## 2021-02-13 ENCOUNTER — Other Ambulatory Visit: Payer: Self-pay

## 2021-02-13 ENCOUNTER — Encounter (HOSPITAL_BASED_OUTPATIENT_CLINIC_OR_DEPARTMENT_OTHER): Payer: Medicaid Other | Admitting: Internal Medicine

## 2021-02-13 DIAGNOSIS — L97328 Non-pressure chronic ulcer of left ankle with other specified severity: Secondary | ICD-10-CM | POA: Diagnosis not present

## 2021-02-13 DIAGNOSIS — L03116 Cellulitis of left lower limb: Secondary | ICD-10-CM | POA: Diagnosis not present

## 2021-02-13 DIAGNOSIS — I87332 Chronic venous hypertension (idiopathic) with ulcer and inflammation of left lower extremity: Secondary | ICD-10-CM | POA: Diagnosis not present

## 2021-02-16 NOTE — Progress Notes (Signed)
IRVIN, BASTIN ([id number]) Visit Report for 02/11/2021 Arrival Information Details Patient Name: Date of Service: [name] [place] 02/11/2021 8:30 A M Medical Record Number: [id number] Patient Account Number: [id number] Date of Birth/Sex: Treating RN: 1975/08/20 (45 y.o. Burnadette Pop, [name] Primary Care Kasiyah Platter: PCP, NO Other Clinician: Referring Naomie Crow: Treating Jameal Razzano/Extender: Milinda Pointer in Treatment: 0 Visit Information History Since Last Visit Added or deleted any medications: No Patient Arrived: Ambulatory Any new allergies or adverse reactions: No Arrival Time: 08:24 Had a fall or experienced change in No Accompanied By: case manager activities of daily living that may affect Transfer Assistance: None risk of falls: Patient Identification Verified: Yes Signs or symptoms of abuse/neglect since last visito No Secondary Verification Process Completed: Yes Hospitalized since last visit: No Patient Requires Transmission-Based Precautions: No Implantable device outside of the clinic excluding No Patient Has Alerts: No cellular tissue based products placed in the center since last visit: Has Dressing in Place as Prescribed: Yes Pain Present Now: No Electronic Signature(s) Signed: 02/11/2021 6:12:45 PM By: Rhae Hammock RN Entered By: Rhae Hammock on 02/11/2021 08:24:41 -------------------------------------------------------------------------------- Compression Therapy Details Patient Name: Date of Service: [name] [name] 02/11/2021 8:30 A M Medical Record Number: [id number] Patient Account Number: [id number] Date of Birth/Sex: Treating RN: Jun 02, 1976 (45 y.o. [name] Primary Care Marielis Samara: PCP, NO Other Clinician: Referring Steffani Dionisio: Treating Ellard Nan/Extender: Milinda Pointer in Treatment: 0 Compression Therapy Performed for Wound Assessment: Wound #1 Left Ankle Performed By: Clinician Levan Hurst, RN Compression Type: Three Layer Post Procedure Diagnosis Same as Pre-procedure Electronic Signature(s) Signed: 02/12/2021 6:01:10 PM By: Levan Hurst RN, BSN Entered By: Levan Hurst on 02/11/2021 08:52:26 -------------------------------------------------------------------------------- Lower Extremity Assessment Details Patient Name: Date of Service: [name] NA [place] 02/11/2021 8:30 A M Medical Record Number: [id number] Patient Account Number: [id number] Date of Birth/Sex: Treating RN: [birth year] (45 y.o. [name] Primary Care Alexiz Cothran: PCP, NO Other Clinician: Referring Lesbia Ottaway: Treating Irisha Grandmaison/Extender: Milinda Pointer in Treatment: 0 Edema Assessment Assessed: Shirlyn Goltz: No] [name]: No] Edema: [Left: N] [Right: o] Calf Left: Right: Point of Measurement: 28 cm From Medial Instep 33.5 cm Ankle Left: Right: Point of Measurement: 8 cm From Medial Instep 23.5 cm Vascular Assessment Pulses: Dorsalis Pedis Palpable: [Right:Yes] Electronic Signature(s) Signed: 02/11/2021 6:12:45 PM By: Rhae Hammock RN Entered By: Rhae Hammock on 02/11/2021 08:30:03 -------------------------------------------------------------------------------- Multi Wound Chart Details Patient Name: Date of Service: [name] NA [place] 02/11/2021 8:30 A M Medical Record Number: [id number] Patient Account Number: [id number] Date of Birth/Sex: Treating RN: 1975/08/03 (45 y.o. [name] Primary Care Reg Bircher: PCP, NO Other Clinician: Referring Shanita Kanan: Treating Keanu Frickey/Extender: Milinda Pointer in Treatment: 0 Vital Signs Height(in): Pulse(bpm): 18 Weight(lbs): Blood Pressure(mmHg): 115/78 Body Mass Index(BMI): Temperature(F): 98.1 Respiratory Rate(breaths/min): 17 Photos: [1:No Photos Left Ankle] [N/A:N/A N/A] Wound Location: [1:Trauma] [N/A:N/A] Wounding Event: [1:Trauma, Other] [N/A:N/A] Primary Etiology:  [1:10/14/2020] [N/A:N/A] Date Acquired: [1:0] [N/A:N/A] Weeks of Treatment: [1:Open] [N/A:N/A] Wound Status: [1:7x13x0.4] [N/A:N/A] Measurements L x W x D (cm) [1:71.471] [N/A:N/A] A (cm) : rea [1:28.588] [N/A:N/A] Volume (cm) : [1:-16.10%] [N/A:N/A] % Reduction in A rea: [1:-16.10%] [N/A:N/A] % Reduction in Volume: [1:Full Thickness Without Exposed] [N/A:N/A] Classification: [1:Support Structures Medium] [N/A:N/A] Exudate Amount: [1:Serosanguineous] [N/A:N/A] Exudate Type: [1:red, brown] [N/A:N/A] Exudate Color: [1:Yes] [N/A:N/A] Foul Odor A Cleansing: [1:fter No] [N/A:N/A] Odor Anticipated Due to Product Use: [1:Well defined, not attached] [N/A:N/A] Wound Margin: [1:Small (1-33%)] [N/A:N/A] Granulation A mount: [1:Red,  Pink] [N/A:N/A] Granulation Quality: [1:Large (67-100%)] [N/A:N/A] Necrotic Amount: [1:Eschar, Adherent Slough] [N/A:N/A] Necrotic Tissue: [1:Fat Layer (Subcutaneous Tissue): Yes N/A] Exposed Structures: [1:Fascia: No Tendon: No Muscle: No Joint: No Bone: No None] [N/A:N/A] Epithelialization: [1:Compression Therapy] [N/A:N/A] Treatment Notes Electronic Signature(s) Signed: 02/11/2021 5:03:28 PM By: Linton Ham MD Signed: 02/12/2021 6:01:10 PM By: Levan Hurst RN, BSN Entered By: Linton Ham on 02/11/2021 09:00:25 -------------------------------------------------------------------------------- Multi-Disciplinary Care Plan Details Patient Name: Date of Service: [name] [name] 02/11/2021 8:30 A M Medical Record Number: [id number] Patient Account Number: [id number] Date of Birth/Sex: Treating RN: 1976/03/19 (45 y.o. [name] Primary Care Addylynn Balin: PCP, NO Other Clinician: Referring Ailany Koren: Treating Kristiana Jacko/Extender: Milinda Pointer in Treatment: 0 Active Inactive Orientation to the Wound Care Program Nursing Diagnoses: Knowledge deficit related to the wound healing center program Goals: Patient/caregiver will  verbalize understanding of the [place] Date Initiated: 02/05/2021 Target Resolution Date: 03/06/2021 Goal Status: Active Interventions: Provide education on orientation to the wound center Notes: Pain, Acute or Chronic Nursing Diagnoses: Pain, acute or chronic: actual or potential Potential alteration in comfort, pain Goals: Patient will verbalize adequate pain control and receive pain control interventions during procedures as needed Date Initiated: 02/05/2021 Target Resolution Date: 03/05/2021 Goal Status: Active Patient/caregiver will verbalize comfort level met Date Initiated: 02/05/2021 Target Resolution Date: 03/04/2021 Goal Status: Active Interventions: Encourage patient to take pain medications as prescribed Provide education on pain management Reposition patient for comfort Treatment Activities: Administer pain control measures as ordered : 02/05/2021 Notes: Wound/Skin Impairment Nursing Diagnoses: Knowledge deficit related to ulceration/compromised skin integrity Goals: Patient/caregiver will verbalize understanding of skin care regimen Date Initiated: 02/05/2021 Target Resolution Date: 03/05/2021 Goal Status: Active Interventions: Assess patient/caregiver ability to obtain necessary supplies Assess patient/caregiver ability to perform ulcer/skin care regimen upon admission and as needed Provide education on ulcer and skin care Treatment Activities: Skin care regimen initiated : 02/05/2021 Topical wound management initiated : 02/05/2021 Notes: Electronic Signature(s) Signed: 02/12/2021 6:01:10 PM By: Levan Hurst RN, BSN Entered By: Levan Hurst on 02/11/2021 08:39:40 -------------------------------------------------------------------------------- Pain Assessment Details Patient Name: Date of Service: [name] NA [place] 02/11/2021 8:30 A M Medical Record Number: [id number] Patient Account Number: [id number] Date of Birth/Sex: Treating RN: March 18, 1976  (45 y.o. [name] Primary Care Leylah Tarnow: PCP, NO Other Clinician: Referring Garnet Overfield: Treating Sukhman Martine/Extender: Milinda Pointer in Treatment: 0 Active Problems Location of Pain Severity and Description of Pain Patient Has Paino No Site Locations Pain Management and Medication Current Pain Management: Electronic Signature(s) Signed: 02/11/2021 6:12:45 PM By: Rhae Hammock RN Entered By: Rhae Hammock on 02/11/2021 08:25:56 -------------------------------------------------------------------------------- Patient/Caregiver Education Details Patient Name: Date of Service: [name] [name] 7/13/2022andnbsp8:30 A M Medical Record Number: [id number] Patient Account Number: [id number] Date of Birth/Gender: Treating RN: 1975-12-01 (45 y.o. [name] Primary Care Physician: PCP, NO Other Clinician: Referring Physician: Treating Physician/Extender: Milinda Pointer in Treatment: 0 Education Assessment Education Provided To: Patient Education Topics Provided Wound/Skin Impairment: Methods: Explain/Verbal Responses: State content correctly Electronic Signature(s) Signed: 02/12/2021 6:01:10 PM By: Levan Hurst RN, BSN Entered By: Levan Hurst on 02/11/2021 08:41:32 -------------------------------------------------------------------------------- Wound Assessment Details Patient Name: Date of Service: [name] NA [place] 02/11/2021 8:30 A M Medical Record Number: [id number] Patient Account Number: [id number] Date of Birth/Sex: Treating RN: Sep 16, 1975 (45 y.o. [name] Primary Care Yarethzi Branan: PCP, NO Other Clinician: Referring Abella Shugart: Treating Jarelle Ates/Extender: Milinda Pointer in Treatment: 0 Wound Status  Wound Number: 1 Primary Etiology: Trauma, Other Wound Location: Left Ankle Wound Status: Open Wounding Event: Trauma Date Acquired: 10/14/2020 Weeks Of Treatment:  0 Clustered Wound: No Photos Wound Measurements Length: (cm) 7 Width: (cm) 13 Depth: (cm) 0.4 Area: (cm) 71.471 Volume: (cm) 28.588 % Reduction in Area: -16.1% % Reduction in Volume: -16.1% Epithelialization: None Tunneling: No Undermining: No Wound Description Classification: Full Thickness Without Exposed Support Structures Wound Margin: Well defined, not attached Exudate Amount: Medium Exudate Type: Serosanguineous Exudate Color: red, brown Foul Odor After Cleansing: Yes Due to Product Use: No Slough/Fibrino Yes Wound Bed Granulation Amount: Small (1-33%) Exposed Structure Granulation Quality: Red, Pink Fascia Exposed: No Necrotic Amount: Large (67-100%) Fat Layer (Subcutaneous Tissue) Exposed: Yes Necrotic Quality: Eschar, Adherent Slough Tendon Exposed: No Muscle Exposed: No Joint Exposed: No Bone Exposed: No Electronic Signature(s) Signed: 02/12/2021 6:01:49 PM By: Rhae Hammock RN Signed: 02/16/2021 3:49:49 PM By: Sandre Kitty Previous Signature: 02/11/2021 6:12:45 PM Version By: Rhae Hammock RN Entered By: Sandre Kitty on 02/12/2021 08:24:04 -------------------------------------------------------------------------------- Vitals Details Patient Name: Date of Service: [name], [name] 02/11/2021 8:30 A M Medical Record Number: [id number] Patient Account Number: [id number] Date of Birth/Sex: Treating RN: [birth year] (46 y.o. [name] Primary Care Ravi Tuccillo: PCP, NO Other Clinician: Referring Arriel Victor: Treating Aveah Castell/Extender: Milinda Pointer in Treatment: 0 Vital Signs Time Taken: 08:24 Temperature (F): 98.1 Pulse (bpm): 76 Respiratory Rate (breaths/min): 17 Blood Pressure (mmHg): 115/78 Reference Range: 80 - 120 mg / dl Electronic Signature(s) Signed: 02/11/2021 6:12:45 PM By: Rhae Hammock RN Entered By: Rhae Hammock on 02/11/2021 08:25:15

## 2021-02-16 NOTE — Progress Notes (Signed)
ALTA, SHOBER ([id number]) Visit Report for 02/13/2021 SuperBill Details Patient Name: Date of Service: [name] 02/13/2021 Medical Record Number: [id number] Patient Account Number: [id number] Date of Birth/Sex: Treating RN: April 17, 1976 (45 y.o. [name] Primary Care Provider: PCP, NO Other Clinician: Referring Provider: Treating Provider/Extender: Chesley Mires in Treatment: 1 Diagnosis Coding ICD-10 Codes Code Description [phone] Non-pressure chronic ulcer of left ankle with other specified severity L03.116 Cellulitis of left lower limb I87.332 Chronic venous hypertension (idiopathic) with ulcer and inflammation of left lower extremity Facility Procedures CPT4 Code Description Modifier Quantity [id number] (Facility Use Only) [phone] - APPLY MULTLAY COMPRS LWR LT LEG 1 Electronic Signature(s) Signed: 02/16/2021 5:04:52 PM By: Zandra Abts RN, BSN Signed: 02/16/2021 5:31:06 PM By: Geralyn Corwin DO Entered By: Zandra Abts on 02/16/2021 13:43:40

## 2021-02-16 NOTE — Progress Notes (Signed)
[name] ([id number]) Visit Report for 02/13/2021 Arrival Information Details Patient Name: Date of Service: [name] [place] NIE 02/13/2021 10:30 [name] M Medical Record Number: [id number] Patient Account Number: [id number] Date of Birth/Sex: Treating RN: [birth year] (45 y.o. [name] Primary Care [name]: PCP, NO Other [name]: Referring [name]: Treating [name]/Extender: [name] in Treatment: 1 Visit Information History Since Last Visit Added or deleted any medications: No Patient Arrived: Ambulatory Any new allergies or adverse reactions: No Arrival Time: 10:50 Had [name] fall or experienced change in No Accompanied By: interpretor activities of daily living that may affect Transfer Assistance: None risk of falls: Patient Identification Verified: Yes Signs or symptoms of abuse/neglect since last visito No Secondary Verification Process Completed: Yes Hospitalized since last visit: No Patient Requires Transmission-Based Precautions: No Implantable device outside of the clinic excluding No Patient Has Alerts: No cellular tissue based products placed in the center since last visit: Has Dressing in Place as Prescribed: Yes Has Compression in Place as Prescribed: Yes Pain Present Now: No Electronic Signature(s) Signed: 02/16/2021 5:04:52 PM By: [name] Entered By: [name] on 02/16/2021 13:41:46 -------------------------------------------------------------------------------- Compression Therapy Details Patient Name: Date of Service: [name] 02/13/2021 10:30 [name] M Medical Record Number: [id number] Patient Account Number: [id number] Date of Birth/Sex: Treating RN: 1976/04/19 (45 y.o. [name] Primary Care [name]: PCP, NO Other [name]: Referring [name]: Treating [name]: [name] in Treatment: 1 Compression Therapy Performed for Wound Assessment: Wound  #1 Left Ankle Performed By: [name] [name] Abts, RN Compression Type: Three Emergency planning/management officer) Signed: 02/16/2021 5:04:52 PM By: [name] Entered By: [name] on 02/16/2021 13:43:01 -------------------------------------------------------------------------------- Encounter Discharge Information Details Patient Name: Date of Service: [name] 02/13/2021 10:30 [name] M Medical Record Number: [id number] Patient Account Number: [id number] Date of Birth/Sex: Treating RN: 06-Nov-1975 (45 y.o. [name] Primary Care [name]: PCP, NO Other [name]: Referring [name]: Treating [name]: [name] in Treatment: 1 Encounter Discharge Information Items Discharge Condition: Stable Ambulatory Status: Ambulatory Discharge Destination: Home Transportation: Private Auto Accompanied By: interpreter Schedule Follow-up Appointment: Yes Clinical Summary of Care: Patient Declined Electronic Signature(s) Signed: 02/16/2021 5:04:52 PM By: [name] Entered By: [name] on 02/16/2021 13:43:34 -------------------------------------------------------------------------------- Wound Assessment Details Patient Name: Date of Service: [name] 02/13/2021 10:30 [name] M Medical Record Number: [id number] Patient Account Number: [id number] Date of Birth/Sex: Treating RN: 06/01/1976 (45 y.o. [name] Primary Care [name]: PCP, NO Other [name]: Referring [name]: Treating [name]: [name] in Treatment: 1 Wound Status Wound Number: 1 Primary Etiology: Trauma, Other Wound Location: Left Ankle Wound Status: Open Wounding Event: Trauma Date Acquired: 10/14/2020 Weeks Of Treatment: 1 Clustered Wound: No Wound Measurements Length: (cm) 7 Width: (cm) 13 Depth: (cm) 0.4 Area: (cm) 71.471 Volume: (cm) 28.588 % Reduction in Area: -16.1% %  Reduction in Volume: -16.1% Epithelialization: None Tunneling: No Undermining: No Wound Description Classification: Full Thickness Without Exposed Support Structures Wound Margin: Well defined, not attached Exudate Amount: Medium Exudate Type: Serosanguineous Exudate Color: red, brown Foul Odor After Cleansing: Yes Due to Product Use: No Slough/Fibrino Yes Wound Bed Granulation Amount: Small (1-33%) Exposed Structure Granulation Quality: Red, Pink Fascia Exposed: No Necrotic Amount: Large (67-100%) Fat Layer (Subcutaneous Tissue) Exposed: Yes Necrotic Quality: Eschar, Adherent Slough Tendon Exposed: No Muscle Exposed: No Joint Exposed: No Bone Exposed: No Treatment Notes Wound #1 (Ankle)  Wound Laterality: Left Cleanser Soap and Water Discharge Instruction: May shower and wash wound with dial antibacterial soap and water prior to dressing change. Wound Cleanser Discharge Instruction: Cleanse the wound with wound cleanser prior to applying [name] clean dressing using gauze sponges, not tissue or cotton balls. Peri-Wound Care Triamcinolone 15 (g) Discharge Instruction: Use triamcinolone 15 (g) as directed Zinc Oxide Ointment 30g tube Discharge Instruction: Apply Zinc Oxide to periwound with each dressing change Sween Lotion (Moisturizing lotion) Discharge Instruction: Apply moisturizing lotion as directed Topical Primary Dressing KerraCel Ag Gelling Fiber Dressing, 4x5 in (silver alginate) Discharge Instruction: Apply silver alginate to wound bed as instructed Secondary Dressing ABD Pad, 8x10 Discharge Instruction: Apply over primary dressing as directed. Zetuvit Plus 4x8 in Discharge Instruction: Apply over primary dressing as directed. CarboFLEX Odor Control Dressing, 4x4 in Discharge Instruction: Apply over primary dressing as directed. Secured With Compression Wrap ThreePress (3 layer compression wrap) Discharge Instruction: Apply three layer compression as  directed. Compression Stockings Add-Ons Electronic Signature(s) Signed: 02/16/2021 5:04:52 PM By: [name] Entered By: [name] on 02/16/2021 13:42:48 -------------------------------------------------------------------------------- Vitals Details Patient Name: Date of Service: [name], [name] [name] 02/13/2021 10:30 [name] M Medical Record Number: [id number] Patient Account Number: [id number] Date of Birth/Sex: Treating RN: 1975/08/31 (45 y.o. [name] Primary Care Jorgina Binning: PCP, NO Other [name]: Referring Cherry Tur[place]: Treating Jourden Delmont/Extender: [name] in Treatment: 1 Vital Signs Time Taken: 10:50 Temperature (F): 98.1 Pulse (bpm): 59 Respiratory Rate (breaths/min): 16 Blood Pressure (mmHg): 114/67 Reference Range: 80 - 120 mg / dl Electronic Signature(s) Signed: 02/16/2021 5:04:52 PM By: [name] Entered By: [name] on 02/16/2021 13:42:10

## 2021-02-17 ENCOUNTER — Other Ambulatory Visit: Payer: Self-pay

## 2021-02-17 ENCOUNTER — Encounter: Payer: Self-pay | Admitting: Internal Medicine

## 2021-02-17 ENCOUNTER — Encounter (HOSPITAL_BASED_OUTPATIENT_CLINIC_OR_DEPARTMENT_OTHER): Payer: Medicaid Other | Admitting: Internal Medicine

## 2021-02-17 ENCOUNTER — Ambulatory Visit (INDEPENDENT_AMBULATORY_CARE_PROVIDER_SITE_OTHER): Payer: Medicaid Other | Admitting: Internal Medicine

## 2021-02-17 DIAGNOSIS — L98492 Non-pressure chronic ulcer of skin of other sites with fat layer exposed: Secondary | ICD-10-CM

## 2021-02-17 DIAGNOSIS — L98499 Non-pressure chronic ulcer of skin of other sites with unspecified severity: Secondary | ICD-10-CM | POA: Insufficient documentation

## 2021-02-17 NOTE — Progress Notes (Signed)
[facility]      Reason for Consult:skin ulceration of left ankle    Referring Physician: Dr. Leanord Hawking    Patient ID: [name], male    DOB: August 10, 1975, 45 y.o.   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   He is a refugee from the [place] and has been in the [place] since October 2021.  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  This has steadily increased and he has been sent to wound care, Dr. Leanord Hawking.  His ulcer has not improved with some necrotics tissue on the surface.  Xray of his foot was negative for any deep infection and he had a wound swab positive for Strep agalactiae. He was treated with doxycycline initially and changed to cephalexin based on the culture. He has not history of an open ulcer like this in the past.  He does have a remote history of trauma to the leg requiring surgery.  It has been noted to be malodorous.  No biopsy has been taken yet.  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   PMH: none  Prior to Admission medications   Medication Sig Start Date End Date Taking? Authorizing Provider  bacitracin ointment Apply 1 application topically 2 (two) times daily. 01/29/21  Yes Wallis Bamberg, PA-C  cephALEXin (KEFLEX) 500 MG capsule Take 500 mg by mouth every 6 (six) hours. 02/11/21  Yes [provider]  naproxen (NAPROSYN) 375 MG tablet Take 1 tablet (375 mg total) by mouth 2 (two) times daily with a meal. Patient not taking: Reported on 02/17/2021 01/29/21   Wallis Bamberg, PA-C  ondansetron (ZOFRAN ODT) 4 MG disintegrating tablet Take 1 tablet (4 mg total) by mouth every 8 (eight) hours as needed for nausea or vomiting. Patient not taking: Reported on 02/17/2021 12/02/20   Rushie Chestnut, PA-C    No Known Allergies  Social History   Tobacco Use   Smoking status: Every Day    Types: Cigarettes    Smokeless tobacco: Never  Vaping Use   Vaping Use: Never used  Substance Use Topics   Alcohol use: Never   Drug use: Never   FMH: patient is unaware of any family history  Review of Systems  Constitutional: negative for fevers, chills, and weight loss Gastrointestinal: negative for nausea and diarrhea Musculoskeletal: negative for myalgias and arthralgias All other systems reviewed and are negative    Constitutional: in no apparent distress  Vitals:   02/17/21 1000  Temp: 97.8 F (36.6 C)   EYES: anicteric Cardiovascular: Cor RRR Respiratory: clear Musculoskeletal: no pedal edema noted Skin: left ankle with large open ulcer, fat layer exposed, no undermining around the ulcer, + foul odor, no purulence.    Labs: No results found for: WBC, HGB, HCT, MCV, PLT No results found for: CREATININE, BUN, NA, K, CL, CO2 No results found for: ALT, AST, GGT, ALKPHOS, BILITOT, INR   Assessment: non-healing ulcer of left leg.  Differential is broad and includes infection such as Mycobacterial or fungal infection related to living in [place] (Mycobacterial disease) or working in a Surveyor, quantity (Histoplasma, Cryptococcus), eumycetoma, actinomycetoma, less likely typical bacterial though seems to have some element of superinfection.  Other possibilities include pyoderma gangrenosum, some variation of pemphigus, chronic venous insufficiency.  There is no undermining so M buruli  not likely, though certainly can be a consideration. I recommend a biopsy.  Plan: 1)  Biopsy per wound care - histology, stain for fungal and AFB.  Separate sample in saline for AFB culture, fungal culture, bacterial culture A sample can be sent to the [facility] molecular microbiology via pathology Jerrell Belfast) - for molecular testing for Mycobacteria, yeast and mold sequencing.  I can contact pathology to arrange once the sample is sent to them.

## 2021-02-18 ENCOUNTER — Encounter (HOSPITAL_BASED_OUTPATIENT_CLINIC_OR_DEPARTMENT_OTHER): Payer: Medicaid Other | Admitting: Internal Medicine

## 2021-02-18 DIAGNOSIS — L97322 Non-pressure chronic ulcer of left ankle with fat layer exposed: Secondary | ICD-10-CM | POA: Diagnosis not present

## 2021-02-18 DIAGNOSIS — L03116 Cellulitis of left lower limb: Secondary | ICD-10-CM | POA: Diagnosis not present

## 2021-02-18 DIAGNOSIS — I87332 Chronic venous hypertension (idiopathic) with ulcer and inflammation of left lower extremity: Secondary | ICD-10-CM | POA: Diagnosis not present

## 2021-02-18 DIAGNOSIS — L97328 Non-pressure chronic ulcer of left ankle with other specified severity: Secondary | ICD-10-CM | POA: Diagnosis not present

## 2021-02-20 ENCOUNTER — Encounter (HOSPITAL_BASED_OUTPATIENT_CLINIC_OR_DEPARTMENT_OTHER): Payer: Medicaid Other | Admitting: Internal Medicine
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
## 2021-02-23 NOTE — Progress Notes (Signed)
[name], [name] ([id number]) Visit Report for 02/18/2021 Arrival Information Details Patient Name: Date of Service: [name] [place] 02/18/2021 9:30 A M Medical Record Number: [id number] Patient Account Number: [id number] Date of Birth/Sex: Treating RN: [birth year] (46 y.o. [name] Primary Care [name]: PCP, NO Other Clinician: Referring [name]: Treating [name]/Extender: [name] in Treatment: 1 Visit Information History Since Last Visit Added or deleted any medications: No Patient Arrived: Ambulatory Any new allergies or adverse reactions: No Arrival Time: 10:11 Had a fall or experienced change in No Accompanied By: case manger activities of daily living that may affect Transfer Assistance: None risk of falls: Patient Identification Verified: Yes Signs or symptoms of abuse/neglect since last visito No Secondary Verification Process Completed: Yes Hospitalized since last visit: No Patient Requires Transmission-Based Precautions: No Implantable device outside of the clinic excluding No Patient Has Alerts: No cellular tissue based products placed in the center since last visit: Has Dressing in Place as Prescribed: Yes Pain Present Now: Yes Electronic Signature(s) Signed: 02/20/2021 10:49:45 AM By: [name] Entered By: [name] on 02/18/2021 10:12:14 -------------------------------------------------------------------------------- Compression Therapy Details Patient Name: Date of Service: [name] NA [place] 02/18/2021 9:30 A M Medical Record Number: [id number] Patient Account Number: [id number] Date of Birth/Sex: Treating RN: 08/24/1975 (45 y.o. [name] Primary Care Hibba Schram: PCP, NO Other Clinician: Referring [name]: Treating [name]/Extender: [name] in Treatment: 1 Compression Therapy Performed for Wound Assessment: Wound #1 Left Ankle Performed By: Clinician [name] Hurst, RN Compression Type: Three Layer Post Procedure Diagnosis Same as Pre-procedure Electronic Signature(s) Signed: 02/23/2021 4:44:07 PM By: [name] Hurst RN, BSN Entered By: [name] on 02/18/2021 10:27:33 -------------------------------------------------------------------------------- Encounter Discharge Information Details Patient Name: Date of Service: [name] NA [place] 02/18/2021 9:30 A M Medical Record Number: [id number] Patient Account Number: [id number] Date of Birth/Sex: Treating RN: 11/15/1975 (45 y.o. [name] Primary Care Peyten Weare: PCP, NO Other Clinician: Referring [name]: Treating [name]/Extender: [name] in Treatment: 1 Encounter Discharge Information Items Post Procedure Vitals Discharge Condition: Stable Temperature (F): 98.7 Ambulatory Status: Ambulatory Pulse (bpm): 74 Discharge Destination: Home Respiratory Rate (breaths/min): 17 Transportation: Private Auto Blood Pressure (mmHg): 133/78 Accompanied By: case manager andandandand interpreter Schedule Follow-up Appointment: Yes Clinical Summary of Care: Patient Declined Electronic Signature(s) Signed: 02/19/2021 7:48:44 PM By: [name] Hammock RN Entered By: [name] on 02/18/2021 12:05:04 -------------------------------------------------------------------------------- Lower Extremity Assessment Details Patient Name: Date of Service: [name] NA [place] 02/18/2021 9:30 A M Medical Record Number: [id number] Patient Account Number: [id number] Date of Birth/Sex: Treating RN: Apr 16, 1976 (45 y.o. [name] Primary Care [name]: PCP, NO Other Clinician: Referring [name]: Treating [name]/Extender: [name] in Treatment: 1 Edema Assessment Assessed: [Left: No] [name]: No] Edema: [Left: N] [Right: o] Calf Left: Right: Point of Measurement: 28 cm From Medial Instep 32 cm Ankle Left: Right: Point of  Measurement: 8 cm From Medial Instep 22 cm Vascular Assessment Pulses: Dorsalis Pedis Palpable: [Right:Yes] Electronic Signature(s) Signed: 02/23/2021 4:44:07 PM By: [name] Hurst RN, BSN Entered By: [name] on 02/18/2021 10:13:38 -------------------------------------------------------------------------------- Multi Wound Chart Details Patient Name: Date of Service: [name] NA [place] 02/18/2021 9:30 A M Medical Record Number: [id number] Patient Account Number: [id number] Date of Birth/Sex: Treating RN: 1975/10/27 (46 y.o. [name] Primary Care [name]: PCP, NO Other Clinician: Referring [name]: Treating [name]/Extender: [name] in Treatment: 1 Vital Signs Height(in): Pulse(bpm): 7 Weight(lbs): Blood Pressure(mmHg):  125/78 Body Mass Index(BMI): Temperature(F): 98.0 Respiratory Rate(breaths/min): 16 Photos: [1:No Photos Left Ankle] [N/A:N/A N/A] Wound Location: [1:Trauma] [N/A:N/A] Wounding Event: [1:Trauma, Other] [N/A:N/A] Primary Etiology: [1:10/14/2020] [N/A:N/A] Date Acquired: [1:1] [N/A:N/A] Weeks of Treatment: [1:Open] [N/A:N/A] Wound Status: [1:5.7x11x0.2] [N/A:N/A] Measurements L x W x D (cm) [1:49.244] [N/A:N/A] A (cm) : rea [1:9.849] [N/A:N/A] Volume (cm) : [1:20.00%] [N/A:N/A] % Reduction in A rea: [1:60.00%] [N/A:N/A] % Reduction in Volume: [1:Full Thickness Without Exposed] [N/A:N/A] Classification: [1:Support Structures Medium] [N/A:N/A] Exudate A mount: [1:Serosanguineous] [N/A:N/A] Exudate Type: [1:red, brown] [N/A:N/A] Exudate Color: [1:Well defined, not attached] [N/A:N/A] Wound Margin: [1:Medium (34-66%)] [N/A:N/A] Granulation A mount: [1:Red, Pink] [N/A:N/A] Granulation Quality: [1:Medium (34-66%)] [N/A:N/A] Necrotic A mount: [1:Fat Layer (Subcutaneous Tissue): Yes N/A] Exposed Structures: [1:Fascia: No Tendon: No Muscle: No Joint: No Bone: No Small (1-33%)] [N/A:N/A] Epithelialization:  [1:Debridement - Excisional] [N/A:N/A] Debridement: Pre-procedure Verification/Time Out 10:25 [N/A:N/A] Taken: [1:Subcutaneous, Slough] [N/A:N/A] Tissue Debrided: [1:Skin/Subcutaneous Tissue] [N/A:N/A] Level: [1:62.7] [N/A:N/A] Debridement A (sq cm): [1:rea Curette] [N/A:N/A] Instrument: [1:Moderate] [N/A:N/A] Bleeding: [1:Pressure] [N/A:N/A] Hemostasis A chieved: [1:5] [N/A:N/A] Procedural Pain: [1:3] [N/A:N/A] Post Procedural Pain: [1:Procedure was tolerated well] [N/A:N/A] Debridement Treatment Response: [1:5.7x11x0.2] [N/A:N/A] Post Debridement Measurements L x W x D (cm) [1:9.849] [N/A:N/A] Post Debridement Volume: (cm) [1:Compression Therapy] [N/A:N/A] Procedures Performed: [1:Debridement] Treatment Notes Electronic Signature(s) Signed: 02/18/2021 4:58:30 PM By: Linton Ham MD Signed: 02/23/2021 4:44:07 PM By: [name] Hurst RN, BSN Entered By: Linton Ham on 02/18/2021 10:49:01 -------------------------------------------------------------------------------- Multi-Disciplinary Care Plan Details Patient Name: Date of Service: [name] 02/18/2021 9:30 A M Medical Record Number: [id number] Patient Account Number: [id number] Date of Birth/Sex: Treating RN: 05-13-1976 (45 y.o. [name] Primary Care Syliva Mee: PCP, NO Other Clinician: Referring Ernie Sagrero: Treating Catalino Plascencia/Extender: [name] in Treatment: 1 Active Inactive Orientation to the Wound Care Program Nursing Diagnoses: Knowledge deficit related to the wound healing center program Goals: Patient/caregiver will verbalize understanding of the [place] Date Initiated: 02/05/2021 Target Resolution Date: 03/06/2021 Goal Status: Active Interventions: Provide education on orientation to the wound center Notes: Pain, Acute or Chronic Nursing Diagnoses: Pain, acute or chronic: actual or potential Potential alteration in comfort, pain Goals: Patient  will verbalize adequate pain control and receive pain control interventions during procedures as needed Date Initiated: 02/05/2021 Target Resolution Date: 03/05/2021 Goal Status: Active Patient/caregiver will verbalize comfort level met Date Initiated: 02/05/2021 Target Resolution Date: 03/04/2021 Goal Status: Active Interventions: Encourage patient to take pain medications as prescribed Provide education on pain management Reposition patient for comfort Treatment Activities: Administer pain control measures as ordered : 02/05/2021 Notes: Wound/Skin Impairment Nursing Diagnoses: Knowledge deficit related to ulceration/compromised skin integrity Goals: Patient/caregiver will verbalize understanding of skin care regimen Date Initiated: 02/05/2021 Target Resolution Date: 03/05/2021 Goal Status: Active Interventions: Assess patient/caregiver ability to obtain necessary supplies Assess patient/caregiver ability to perform ulcer/skin care regimen upon admission and as needed Provide education on ulcer and skin care Treatment Activities: Skin care regimen initiated : 02/05/2021 Topical wound management initiated : 02/05/2021 Notes: Electronic Signature(s) Signed: 02/23/2021 4:44:07 PM By: [name] Hurst RN, BSN Entered By: [name] on 02/18/2021 10:06:59 -------------------------------------------------------------------------------- Pain Assessment Details Patient Name: Date of Service: [name] NA [place] 02/18/2021 9:30 A M Medical Record Number: [id number] Patient Account Number: [id number] Date of Birth/Sex: Treating RN: 1976-01-11 (45 y.o. [name] Primary Care Jasai Sorg: PCP, NO Other Clinician: Referring Pau Banh: Treating Bevan Vu/Extender: [name] in Treatment: 1 Active Problems Location of Pain Severity and Description  of Pain Patient Has Paino Yes Site Locations Rate the pain. Current Pain Level: 5 Pain Management and Medication Current  Pain Management: Electronic Signature(s) Signed: 02/20/2021 10:49:45 AM By: [name] Signed: 02/23/2021 4:44:07 PM By: [name] Hurst RN, BSN Entered By: [name] on 02/18/2021 10:12:35 -------------------------------------------------------------------------------- Patient/Caregiver Education Details Patient Name: Date of Service: [name] 7/20/2022andnbsp9:30 A M Medical Record Number: [id number] Patient Account Number: [id number] Date of Birth/Gender: Treating RN: 02-Sep-1975 (45 y.o. [name] Primary Care Physician: PCP, NO Other Clinician: Referring Physician: Treating Physician/Extender: [name] in Treatment: 1 Education Assessment Education Provided To: Patient Education Topics Provided Wound/Skin Impairment: Methods: Explain/Verbal Responses: State content correctly Electronic Signature(s) Signed: 02/23/2021 4:44:07 PM By: [name] Hurst RN, BSN Entered By: [name] on 02/18/2021 10:07:09 -------------------------------------------------------------------------------- Wound Assessment Details Patient Name: Date of Service: [name] NA [place] 02/18/2021 9:30 A M Medical Record Number: [id number] Patient Account Number: [id number] Date of Birth/Sex: Treating RN: 01-14-1976 (45 y.o. [name] Primary Care Raneem Mendolia: PCP, NO Other Clinician: Referring Lonni Dirden: Treating Arkel Cartwright/Extender: [name] in Treatment: 1 Wound Status Wound Number: 1 Primary Etiology: Trauma, Other Wound Location: Left Ankle Wound Status: Open Wounding Event: Trauma Date Acquired: 10/14/2020 Weeks Of Treatment: 1 Clustered Wound: No Photos Photo Uploaded By: Donavan Burnet on 02/19/2021 13:58:18 Wound Measurements Length: (cm) 5.7 Width: (cm) 11 Depth: (cm) 0.2 Area: (cm) 49.244 Volume: (cm) 9.849 % Reduction in Area: 20% % Reduction in Volume: 60% Epithelialization: Small  (1-33%) Tunneling: No Undermining: No Wound Description Classification: Full Thickness Without Exposed Support Structures Wound Margin: Well defined, not attached Exudate Amount: Medium Exudate Type: Serosanguineous Exudate Color: red, brown Foul Odor After Cleansing: No Slough/Fibrino Yes Wound Bed Granulation Amount: Medium (34-66%) Exposed Structure Granulation Quality: Red, Pink Fascia Exposed: No Necrotic Amount: Medium (34-66%) Fat Layer (Subcutaneous Tissue) Exposed: Yes Necrotic Quality: Adherent Slough Tendon Exposed: No Muscle Exposed: No Joint Exposed: No Bone Exposed: No Treatment Notes Wound #1 (Ankle) Wound Laterality: Left Cleanser Soap and Water Discharge Instruction: May shower and wash wound with dial antibacterial soap and water prior to dressing change. Wound Cleanser Discharge Instruction: Cleanse the wound with wound cleanser prior to applying a clean dressing using gauze sponges, not tissue or cotton balls. Peri-Wound Care Triamcinolone 15 (g) Discharge Instruction: Use triamcinolone 15 (g) as directed Zinc Oxide Ointment 30g tube Discharge Instruction: Apply Zinc Oxide to periwound with each dressing change Sween Lotion (Moisturizing lotion) Discharge Instruction: Apply moisturizing lotion as directed Topical Primary Dressing KerraCel Ag Gelling Fiber Dressing, 4x5 in (silver alginate) Discharge Instruction: Apply silver alginate to wound bed as instructed Secondary Dressing ABD Pad, 8x10 Discharge Instruction: Apply over primary dressing as directed. Zetuvit Plus 4x8 in Discharge Instruction: Apply over primary dressing as directed. CarboFLEX Odor Control Dressing, 4x4 in Discharge Instruction: Apply over primary dressing as directed. Secured With Compression Wrap ThreePress (3 layer compression wrap) Discharge Instruction: Apply three layer compression as directed. Compression Stockings Add-Ons Electronic Signature(s) Signed: 02/23/2021  4:44:07 PM By: [name] Hurst RN, BSN Entered By: [name] on 02/18/2021 10:14:09 -------------------------------------------------------------------------------- [place] Details Patient Name: Date of Service: [name] NA [place] 02/18/2021 9:30 A M Medical Record Number: [id number] Patient Account Number: [id number] Date of Birth/Sex: Treating RN: 1975/08/08 (45 y.o. [name] Primary Care Alizah Sills: PCP, NO Other Clinician: Referring [name]: Treating Maudry Zeidan/Extender: [name] in Treatment: 1 Vital Signs Time Taken: 10:12 Temperature (F): 98.0 Pulse (bpm): 62 Respiratory  Rate (breaths/min): 16 Blood Pressure (mmHg): 125/78 Reference Range: 80 - 120 mg / dl Electronic Signature(s) Signed: 02/20/2021 10:49:45 AM By: [name] Entered By: [name] on 02/18/2021 10:12:28

## 2021-02-25 ENCOUNTER — Other Ambulatory Visit: Payer: Self-pay

## 2021-02-25 ENCOUNTER — Encounter (HOSPITAL_BASED_OUTPATIENT_CLINIC_OR_DEPARTMENT_OTHER): Payer: Medicaid Other | Admitting: Internal Medicine

## 2021-02-25 ENCOUNTER — Other Ambulatory Visit (HOSPITAL_COMMUNITY)
Admission: RE | Admit: 2021-02-25 | Discharge: 2021-02-25 | Disposition: A | Discharge status: Home / Self Care | Payer: Medicaid Other | Attending: Internal Medicine | Admitting: Internal Medicine

## 2021-02-25 ENCOUNTER — Other Ambulatory Visit (HOSPITAL_BASED_OUTPATIENT_CLINIC_OR_DEPARTMENT_OTHER): Payer: Self-pay | Admitting: Internal Medicine

## 2021-02-25 DIAGNOSIS — L97328 Non-pressure chronic ulcer of left ankle with other specified severity: Secondary | ICD-10-CM | POA: Insufficient documentation

## 2021-02-25 DIAGNOSIS — L97322 Non-pressure chronic ulcer of left ankle with fat layer exposed: Secondary | ICD-10-CM | POA: Diagnosis not present

## 2021-02-25 DIAGNOSIS — L03116 Cellulitis of left lower limb: Secondary | ICD-10-CM | POA: Diagnosis not present

## 2021-02-25 DIAGNOSIS — I87332 Chronic venous hypertension (idiopathic) with ulcer and inflammation of left lower extremity: Secondary | ICD-10-CM | POA: Diagnosis not present

## 2021-02-25 NOTE — Progress Notes (Signed)
EMRYS, MCKAMIE ([id number]) Visit Report for 02/25/2021 Arrival Information Details Patient Name: Date of Service: [name] [place] NIE 02/25/2021 8:15 [name] M Medical Record Number: [id number] Patient Account Number: [id number] Date of Birth/Sex: Treating RN: [birth year] (45 y.o. Marcheta Grammes Primary Care Darian Cansler: PCP, NO Other Clinician: Referring Bula Cavalieri: Treating Darneisha Windhorst/Extender: Milinda Pointer in Treatment: 2 Visit Information History Since Last Visit Added or deleted any medications: No Patient Arrived: Ambulatory Any new allergies or adverse reactions: No Arrival Time: 07:58 Had [name] fall or experienced change in No Accompanied By: case manager activities of daily living that may affect Transfer Assistance: None risk of falls: Patient Identification Verified: Yes Signs or symptoms of abuse/neglect since No Secondary Verification Process Completed: Yes last visito Patient Requires Transmission-Based Precautions: No Hospitalized since last visit: No Patient Has Alerts: No Implantable device outside of the clinic No excluding cellular tissue based products placed in the center since last visit: Has Dressing in Place as Prescribed: Yes Has Footwear/Offloading in Place as Yes Prescribed: Left: Surgical Shoe with Pressure Relief Insole Pain Present Now: Yes Electronic Signature(s) Signed: 02/25/2021 5:58:16 PM By: Lorrin Jackson Entered By: Lorrin Jackson on 02/25/2021 08:44:05 -------------------------------------------------------------------------------- Encounter Discharge Information Details Patient Name: Date of Service: [name] 02/25/2021 8:15 [name] M Medical Record Number: [id number] Patient Account Number: [id number] Date of Birth/Sex: Treating RN: 09-Aug-1975 (45 y.o. Marcheta Grammes Primary Care Sutter Ahlgren: PCP, NO Other Clinician: Referring Sianna Garofano: Treating Maurie Olesen/Extender: Milinda Pointer in Treatment:  2 Encounter Discharge Information Items Post Procedure Vitals Discharge Condition: Stable Temperature (F): 97.6 Ambulatory Status: Ambulatory Pulse (bpm): 67 Discharge Destination: Home Respiratory Rate (breaths/min): 16 Transportation: Private Auto Blood Pressure (mmHg): 118/72 Accompanied By: Case manager, Interpreter Schedule Follow-up Appointment: Yes Clinical Summary of Care: Provided on 02/25/2021 Form Type Recipient Paper Patient Patient Electronic Signature(s) Signed: 02/25/2021 5:58:16 PM By: Lorrin Jackson Signed: 02/25/2021 5:58:16 PM By: Lorrin Jackson Entered By: Lorrin Jackson on 02/25/2021 09:18:52 -------------------------------------------------------------------------------- Lower Extremity Assessment Details Patient Name: Date of Service: [name] 02/25/2021 8:15 [name] M Medical Record Number: [id number] Patient Account Number: [id number] Date of Birth/Sex: Treating RN: 1975/08/30 (45 y.o. Marcheta Grammes Primary Care Stanton Kissoon: PCP, NO Other Clinician: Referring Keland Peyton: Treating Yassmine Tamm/Extender: Milinda Pointer in Treatment: 2 Edema Assessment Assessed: Shirlyn Goltz: Yes] [name]: No] Edema: [Left: N] [Right: o] Calf Left: Right: Point of Measurement: 28 cm From Medial Instep 30.5 cm Ankle Left: Right: Point of Measurement: 8 cm From Medial Instep 21.5 cm Vascular Assessment Pulses: Dorsalis Pedis Palpable: [Left:Yes] Electronic Signature(s) Signed: 02/25/2021 5:58:16 PM By: Lorrin Jackson Entered By: Lorrin Jackson on 02/25/2021 08:18:10 -------------------------------------------------------------------------------- Multi Wound Chart Details Patient Name: Date of Service: [name] NA [name] 02/25/2021 8:15 [name] M Medical Record Number: [id number] Patient Account Number: [id number] Date of Birth/Sex: Treating RN: [birth year] (45 y.o. [name] Primary Care Donisha Hoch: PCP, NO Other Clinician: Referring  Hermen Mario: Treating Amity Roes/Extender: Milinda Pointer in Treatment: 2 Vital Signs Height(in): Pulse(bpm): 66 Weight(lbs): Blood Pressure(mmHg): 118/72 Body Mass Index(BMI): Temperature(F): 97.6 Respiratory Rate(breaths/min): 16 Photos: [1:Left Ankle] [[name]:[name] [name]] Wound Location: [1:Trauma] [[name]:[name]] Wounding Event: [1:Trauma, Other] [[name]:[name]] Primary Etiology: [1:10/14/2020] [[name]:[name]] Date Acquired: [1:2] [[name]:[name]] Weeks of Treatment: [1:Open] [[name]:[name]] Wound Status: [1:5.5x12x0.2] [[name]:[name]] Measurements L x W x D (cm) [1:51.836] [[name]:[name]] [name] (cm) : rea [1:10.367] [[name]:[name]] Volume (cm) : [1:15.80%] [[name]:[name]] % Reduction in [name] rea: [1:57.90%] [[name]:[name]] % Reduction in Volume: [1:Full Thickness Without Exposed] [[name]:[name]]  Classification: [1:Support Structures Medium] [[name]:[name]] Exudate [name] [place]: [1:Serosanguineous] [[name]:[name]] Exudate Type: [1:red, brown] [[name]:[name]] Exudate Color: [1:Well defined, not attached] [[name]:[name]] Wound Margin: [1:Medium (34-66%)] [[name]:[name]] Granulation Amount: [1:Red, Pink] [[name]:[name]] Granulation Quality: [1:Medium (34-66%)] [[name]:[name]] Necrotic Amount: [1:Eschar, Adherent Slough] [[name]:[name]] Necrotic Tissue: [1:Fat Layer (Subcutaneous Tissue): Yes [name]] Exposed Structures: [1:Fascia: No Tendon: No Muscle: No Joint: No Bone: No Small (1-33%)] [[name]:[name]] Epithelialization: [1:Biopsy] [[name]:[name]] Treatment Notes Electronic Signature(s) Signed: 02/25/2021 4:50:28 PM By: Linton Ham MD Signed: 02/25/2021 5:49:27 PM By: Levan Hurst RN, BSN Entered By: Linton Ham on 02/25/2021 09:01:20 -------------------------------------------------------------------------------- Multi-Disciplinary Care Plan Details Patient Name: Date of Service: [name] 02/25/2021 8:15 [name] M Medical Record Number: [id number] Patient Account Number: [id number] Date of Birth/Sex: Treating RN: 1976-07-11 (45 y.o. Marcheta Grammes Primary Care Darick Fetters: PCP,  NO Other Clinician: Referring Ricahrd Schwager: Treating Payden Docter/Extender: Milinda Pointer in Treatment: 2 Active Inactive Orientation to the Wound Care Program Nursing Diagnoses: Knowledge deficit related to the wound healing center program Goals: Patient/caregiver will verbalize understanding of the [place] Date Initiated: 02/05/2021 Target Resolution Date: 03/06/2021 Goal Status: Active Interventions: Provide education on orientation to the wound center Notes: Pain, Acute or Chronic Nursing Diagnoses: Pain, acute or chronic: actual or potential Potential alteration in comfort, pain Goals: Patient will verbalize adequate pain control and receive pain control interventions during procedures as needed Date Initiated: 02/05/2021 Target Resolution Date: 03/05/2021 Goal Status: Active Patient/caregiver will verbalize comfort level met Date Initiated: 02/05/2021 Target Resolution Date: 03/04/2021 Goal Status: Active Interventions: Encourage patient to take pain medications as prescribed Provide education on pain management Reposition patient for comfort Treatment Activities: Administer pain control measures as ordered : 02/05/2021 Notes: Wound/Skin Impairment Nursing Diagnoses: Knowledge deficit related to ulceration/compromised skin integrity Goals: Patient/caregiver will verbalize understanding of skin care regimen Date Initiated: 02/05/2021 Target Resolution Date: 03/05/2021 Goal Status: Active Interventions: Assess patient/caregiver ability to obtain necessary supplies Assess patient/caregiver ability to perform ulcer/skin care regimen upon admission and as needed Provide education on ulcer and skin care Treatment Activities: Skin care regimen initiated : 02/05/2021 Topical wound management initiated : 02/05/2021 Notes: Electronic Signature(s) Signed: 02/25/2021 5:58:16 PM By: Lorrin Jackson Entered By: Lorrin Jackson on 02/25/2021  08:20:48 -------------------------------------------------------------------------------- Pain Assessment Details Patient Name: Date of Service: [name] 02/25/2021 8:15 [name] M Medical Record Number: [id number] Patient Account Number: [id number] Date of Birth/Sex: Treating RN: [birth year] (45 y.o. Marcheta Grammes Primary Care Mordecai Tindol: PCP, NO Other Clinician: Referring Johnella Crumm: Treating Meelah Tallo/Extender: Milinda Pointer in Treatment: 2 Active Problems Location of Pain Severity and Description of Pain Patient Has Paino Yes Site Locations Pain Location: Pain Location: Pain in Ulcers With Dressing Change: Yes Duration of the Pain. Constant / Intermittento Intermittent Rate the pain. Current Pain Level: 7 Character of Pain Describe the Pain: Burning, Throbbing Pain Management and Medication Current Pain Management: Medication: Yes Cold Application: No Rest: Yes Massage: No Activity: No T.E.N.S.: No Heat Application: No Leg drop or elevation: No Is the Current Pain Management Adequate: Inadequate How does your wound impact your activities of daily livingo Sleep: No Bathing: No Appetite: No Relationship With Others: No Bladder Continence: No Emotions: No Bowel Continence: No Work: No Toileting: No Drive: No Dressing: No Hobbies: No Electronic Signature(s) Signed: 02/25/2021 5:58:16 PM By: Lorrin Jackson Entered By: Lorrin Jackson on 02/25/2021 08:14:51 -------------------------------------------------------------------------------- Patient/Caregiver Education Details Patient Name: Date of Service: [name] 7/27/2022andnbsp8:15 [name] M Medical Record Number: [id number]  Patient Account Number: [id number] Date of Birth/Gender: Treating RN: 23-Jul-1976 (45 y.o. Marcheta Grammes Primary Care Physician: PCP, NO Other Clinician: Referring Physician: Treating Physician/Extender: Milinda Pointer in Treatment:  2 Education Assessment Education Provided To: Patient Education Topics Provided Venous: Methods: Explain/Verbal, Printed Responses: State content correctly Wound/Skin Impairment: Methods: Explain/Verbal, Printed Responses: State content correctly Electronic Signature(s) Signed: 02/25/2021 5:58:16 PM By: Lorrin Jackson Entered By: Lorrin Jackson on 02/25/2021 08:21:08 -------------------------------------------------------------------------------- Wound Assessment Details Patient Name: Date of Service: [name] NA [place] 02/25/2021 8:15 [name] M Medical Record Number: [id number] Patient Account Number: [id number] Date of Birth/Sex: Treating RN: Nov 14, 1975 (45 y.o. Marcheta Grammes Primary Care Keah Lamba: PCP, NO Other Clinician: Referring Mylisa Brunson: Treating Deicy Rusk/Extender: Milinda Pointer in Treatment: 2 Wound Status Wound Number: 1 Primary Etiology: Trauma, Other Wound Location: Left Ankle Wound Status: Open Wounding Event: Trauma Date Acquired: 10/14/2020 Weeks Of Treatment: 2 Clustered Wound: No Photos Wound Measurements Length: (cm) 5.5 Width: (cm) 12 Depth: (cm) 0.2 Area: (cm) 51.836 Volume: (cm) 10.367 % Reduction in Area: 15.8% % Reduction in Volume: 57.9% Epithelialization: Small (1-33%) Tunneling: No Undermining: No Wound Description Classification: Full Thickness Without Exposed Support Structures Wound Margin: Well defined, not attached Exudate Amount: Medium Exudate Type: Serosanguineous Exudate Color: red, brown Foul Odor After Cleansing: No Slough/Fibrino Yes Wound Bed Granulation Amount: Medium (34-66%) Exposed Structure Granulation Quality: Red, Pink Fascia Exposed: No Necrotic Amount: Medium (34-66%) Fat Layer (Subcutaneous Tissue) Exposed: Yes Necrotic Quality: Eschar, Adherent Slough Tendon Exposed: No Muscle Exposed: No Joint Exposed: No Bone Exposed: No Treatment Notes Wound #1 (Ankle) Wound Laterality:  Left Cleanser Soap and Water Discharge Instruction: May shower and wash wound with dial antibacterial soap and water prior to dressing change. Wound Cleanser Discharge Instruction: Cleanse the wound with wound cleanser prior to applying [name] clean dressing using gauze sponges, not tissue or cotton balls. Peri-Wound Care Triamcinolone 15 (g) Discharge Instruction: Use triamcinolone 15 (g) as directed Zinc Oxide Ointment 30g tube Discharge Instruction: Apply Zinc Oxide to periwound with each dressing change Sween Lotion (Moisturizing lotion) Discharge Instruction: Apply moisturizing lotion as directed Topical Primary Dressing KerraCel Ag Gelling Fiber Dressing, 4x5 in (silver alginate) Discharge Instruction: Apply silver alginate to wound bed as instructed Secondary Dressing ABD Pad, 8x10 Discharge Instruction: Apply over primary dressing as directed. Zetuvit Plus 4x8 in Discharge Instruction: Apply over primary dressing as directed. CarboFLEX Odor Control Dressing, 4x4 in Discharge Instruction: Apply over primary dressing as directed. Secured With Compression Wrap ThreePress (3 layer compression wrap) Discharge Instruction: Apply three layer compression as directed. Compression Stockings Add-Ons Electronic Signature(s) Signed: 02/25/2021 5:58:16 PM By: Lorrin Jackson Entered By: Lorrin Jackson on 02/25/2021 08:16:24 -------------------------------------------------------------------------------- Vitals Details Patient Name: Date of Service: [name], [name] [name] 02/25/2021 8:15 [name] M Medical Record Number: [id number] Patient Account Number: [id number] Date of Birth/Sex: Treating RN: 10/17/1975 (45 y.o. Marcheta Grammes Primary Care [name]: PCP, NO Other Clinician: Referring Sir Mallis: Treating Elohim Brune/Extender: Milinda Pointer in Treatment: 2 Vital Signs Time Taken: 08:04 Temperature (F): 97.6 Pulse (bpm): 67 Respiratory Rate (breaths/min): 16 Blood  Pressure (mmHg): 118/72 Reference Range: 80 - 120 mg / dl Electronic Signature(s) Signed: 02/25/2021 5:58:16 PM By: Lorrin Jackson Entered By: Lorrin Jackson on 02/25/2021 08:04:41
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
## 2021-02-27 ENCOUNTER — Encounter (HOSPITAL_BASED_OUTPATIENT_CLINIC_OR_DEPARTMENT_OTHER): Payer: Medicaid Other | Admitting: Internal Medicine

## 2021-02-27 ENCOUNTER — Encounter: Payer: Self-pay | Admitting: Internal Medicine

## 2021-02-27 ENCOUNTER — Ambulatory Visit (INDEPENDENT_AMBULATORY_CARE_PROVIDER_SITE_OTHER): Payer: Medicaid Other | Admitting: Internal Medicine

## 2021-02-27 ENCOUNTER — Other Ambulatory Visit: Payer: Self-pay

## 2021-02-27 VITALS — BP 122/76 | HR 58 | Temp 98.2°F | Wt 146.0 lb

## 2021-02-27 DIAGNOSIS — L03116 Cellulitis of left lower limb: Secondary | ICD-10-CM | POA: Diagnosis not present

## 2021-02-27 DIAGNOSIS — I87332 Chronic venous hypertension (idiopathic) with ulcer and inflammation of left lower extremity: Secondary | ICD-10-CM | POA: Diagnosis not present

## 2021-02-27 DIAGNOSIS — L98492 Non-pressure chronic ulcer of skin of other sites with fat layer exposed: Secondary | ICD-10-CM | POA: Diagnosis not present

## 2021-02-27 DIAGNOSIS — L97328 Non-pressure chronic ulcer of left ankle with other specified severity: Secondary | ICD-10-CM | POA: Diagnosis not present

## 2021-02-27 MED ORDER — CLARITHROMYCIN 500 MG PO TABS: 500.0000 mg | ORAL_TABLET | Freq: Two times a day (BID) | ORAL | 0 refills | Status: DC

## 2021-02-27 MED ORDER — SULFAMETHOXAZOLE-TRIMETHOPRIM 800-160 MG PO TABS: 1.0000 | ORAL_TABLET | Freq: Two times a day (BID) | ORAL | 0 refills | Status: DC

## 2021-02-27 NOTE — Progress Notes (Signed)
[name], DELO ([id number]) Visit Report for 02/27/2021 Arrival Information Details Patient Name: Date of Service: [name] [place] NIE 02/27/2021 11:00 A M Medical Record Number: [id number] Patient Account Number: [id number] Date of Birth/Sex: Treating RN: [birth year] (45 y.o. Harlon Flor, Millard.[place] Primary Care Sherman Donaldson: PCP, NO Other Clinician: Referring Katalea Ucci: Treating Venera Privott/Extender: Chesley Mires in Treatment: 3 Visit Information History Since Last Visit Added or deleted any medications: No Patient Arrived: Ambulatory Any new allergies or adverse reactions: No Arrival Time: 11:33 Had a fall or experienced change in No Accompanied By: self activities of daily living that may affect Transfer Assistance: None risk of falls: Patient Identification Verified: Yes Signs or symptoms of abuse/neglect since last visito No Secondary Verification Process Completed: Yes Hospitalized since last visit: No Patient Requires Transmission-Based Precautions: No Implantable device outside of the clinic excluding No Patient Has Alerts: No cellular tissue based products placed in the center since last visit: Has Dressing in Place as Prescribed: Yes Has Compression in Place as Prescribed: Yes Pain Present Now: No Electronic Signature(s) Signed: 02/27/2021 1:14:47 PM By: Shawn Stall Entered By: Shawn Stall on 02/27/2021 13:00:25 -------------------------------------------------------------------------------- Compression Therapy Details Patient Name: Date of Service: [name] 02/27/2021 11:00 A M Medical Record Number: [id number] Patient Account Number: [id number] Date of Birth/Sex: Treating RN: [birth year] (45 y.o. [name] Primary Care Rhoda Waldvogel: PCP, NO Other Clinician: Referring Sydnee Lamour: Treating Amory Zbikowski/Extender: Chesley Mires in Treatment: 3 Compression Therapy Performed for Wound Assessment: Wound #1 Left  Ankle Performed By: Clinician Shawn Stall, RN Compression Type: Three Layer Electronic Signature(s) Signed: 02/27/2021 1:14:47 PM By: Shawn Stall Entered By: Shawn Stall on 02/27/2021 13:01:40 -------------------------------------------------------------------------------- Encounter Discharge Information Details Patient Name: Date of Service: [name] 02/27/2021 11:00 A M Medical Record Number: [id number] Patient Account Number: [id number] Date of Birth/Sex: Treating RN: 05-20-1976 (45 y.o. [name] Primary Care Sohrab [name]: PCP, NO Other Clinician: Referring Decarlos Empey: Treating Sadiel Mota/Extender: Chesley Mires in Treatment: 3 Encounter Discharge Information Items Discharge Condition: Stable Ambulatory Status: Ambulatory Discharge Destination: Home Transportation: Private Auto Accompanied By: interpreter Schedule Follow-up Appointment: Yes Clinical Summary of Care: Electronic Signature(s) Signed: 02/27/2021 1:14:47 PM By: Shawn Stall Entered By: Shawn Stall on 02/27/2021 13:02:10 -------------------------------------------------------------------------------- Patient/Caregiver Education Details Patient Name: Date of Service: [name] 7/29/2022andnbsp11:00 A M Medical Record Number: [id number] Patient Account Number: [id number] Date of Birth/Gender: Treating RN: 05/09/1976 (45 y.o. [name] Primary Care Physician: PCP, NO Other Clinician: Referring Physician: Treating Physician/Extender: Chesley Mires in Treatment: 3 Education Assessment Education Provided To: Patient Education Topics Provided Wound/Skin Impairment: Handouts: Skin Care Do's and Dont's Methods: Explain/Verbal Responses: Reinforcements needed Electronic Signature(s) Signed: 02/27/2021 1:14:47 PM By: Shawn Stall Entered By: Shawn Stall on 02/27/2021  13:01:57 -------------------------------------------------------------------------------- Wound Assessment Details Patient Name: Date of Service: [name] 02/27/2021 11:00 A M Medical Record Number: [id number] Patient Account Number: [id number] Date of Birth/Sex: Treating RN: 03-May-1976 (45 y.o. [name] Primary Care Tavonna Worthington: PCP, NO Other Clinician: Referring Brettney Ficken: Treating Aziza Stuckert/Extender: Chesley Mires in Treatment: 3 Wound Status Wound Number: 1 Primary Etiology: Trauma, Other Wound Location: Left Ankle Wound Status: Open Wounding Event: Trauma Date Acquired: 10/14/2020 Weeks Of Treatment: 3 Clustered Wound: No Wound Measurements Length: (cm) 5.5 Width: (cm) 12 Depth: (cm) 0.2 Area: (cm) 51.836 Volume: (cm) 10.367 % Reduction in Area: 15.8% % Reduction in Volume: 57.9% Epithelialization: None Tunneling: No Undermining:  No Wound Description Classification: Full Thickness Without Exposed Support Structures Wound Margin: Well defined, not attached Exudate Amount: Large Exudate Type: Serosanguineous Exudate Color: red, brown Foul Odor After Cleansing: Yes Due to Product Use: No Slough/Fibrino Yes Wound Bed Granulation Amount: Medium (34-66%) Exposed Structure Granulation Quality: Red, Pink Fascia Exposed: No Necrotic Amount: Medium (34-66%) Fat Layer (Subcutaneous Tissue) Exposed: Yes Necrotic Quality: Adherent Slough Tendon Exposed: No Muscle Exposed: No Joint Exposed: No Bone Exposed: No Treatment Notes Wound #1 (Ankle) Wound Laterality: Left Cleanser Soap and Water Discharge Instruction: May shower and wash wound with dial antibacterial soap and water prior to dressing change. Wound Cleanser Discharge Instruction: Cleanse the wound with wound cleanser prior to applying a clean dressing using gauze sponges, not tissue or cotton balls. Peri-Wound Care Triamcinolone 15 (g) Discharge Instruction: Use  triamcinolone 15 (g) as directed Zinc Oxide Ointment 30g tube Discharge Instruction: Apply Zinc Oxide to periwound with each dressing change Sween Lotion (Moisturizing lotion) Discharge Instruction: Apply moisturizing lotion as directed Topical Primary Dressing KerraCel Ag Gelling Fiber Dressing, 4x5 in (silver alginate) Discharge Instruction: Apply silver alginate to wound bed as instructed Secondary Dressing ABD Pad, 8x10 Discharge Instruction: Apply over primary dressing as directed. Zetuvit Plus 4x8 in Discharge Instruction: Apply over primary dressing as directed. CarboFLEX Odor Control Dressing, 4x4 in Discharge Instruction: Apply over primary dressing as directed. Secured With Compression Wrap ThreePress (3 layer compression wrap) Discharge Instruction: Apply three layer compression as directed. Compression Stockings Add-Ons Electronic Signature(s) Signed: 02/27/2021 1:14:47 PM By: Shawn Stall Entered By: Shawn Stall on 02/27/2021 13:01:22 -------------------------------------------------------------------------------- Vitals Details Patient Name: Date of Service: [name], [name] 02/27/2021 11:00 A M Medical Record Number: [id number] Patient Account Number: [id number] Date of Birth/Sex: Treating RN: [birth year] (45 y.o. [name] Primary Care Michiel Sivley: PCP, NO Other Clinician: Referring Tinzley Dalia: Treating Otila Starn/Extender: Chesley Mires in Treatment: 3 Vital Signs Time Taken: 11:33 Temperature (F): 97.5 Pulse (bpm): 59 Respiratory Rate (breaths/min): 20 Blood Pressure (mmHg): 108/63 Reference Range: 80 - 120 mg / dl Electronic Signature(s) Signed: 02/27/2021 1:14:47 PM By: Shawn Stall Entered By: Shawn Stall on 02/27/2021 13:00:55

## 2021-02-27 NOTE — Progress Notes (Signed)
   Subjective:    Patient ID: [name], male    DOB: 11-07-1975, 45 y.o.   MRN: [id number]  HPI Here for a follow up visit for a non-healing ulcer.   He has a history of remote trauma of his left ankle and in March developed an ulcer of the area that has become necrotic with no singificant healing.  He has been followed by Dr. Leanord Hawking for wound care and debridement and plan for vascular studies.  I saw him last week and recommended path and cultures and he had one biopsy 7/27 but significant bleeding and no other samples sent.  Still with malodor and drainage.   Time spent = 30 minutes with face to face counseling, discussion of the plan, review of cultures, wound care notes and literature review/reference review of differential.      Review of Systems  Constitutional:  Negative for chills, fatigue and fever.  Gastrointestinal:  Negative for diarrhea and nausea.  Skin:  Negative for rash.      Objective:   Physical Exam Eyes:     General: No scleral icterus. Pulmonary:     Effort: Pulmonary effort is normal.  Skin:    Findings: No rash.  Neurological:     Mental Status: He is alert.  Psychiatric:        Mood and Affect: Mood normal.   SH: + tobacco       Assessment & Plan:

## 2021-02-27 NOTE — Assessment & Plan Note (Addendum)
Still unclear etiology and no culture results.  I agree that a vascular etiology is certainly possible.  However, with ongoing discharge, smell,  I will have him start empiric therapy for possible Mycobacterial infection with Bactrim and clarithromycin.  Antifungal also a consideration with fluconazole 400 mg daily if still no improvement and no etiology identified.   Consider biopsies as previously discussed.  Follow up in 2 weeks

## 2021-02-27 NOTE — Progress Notes (Signed)
YANDIEL, BERGUM ([id number]) Visit Report for 02/27/2021 SuperBill Details Patient Name: Date of Service: [name] 02/27/2021 Medical Record Number: [id number] Patient Account Number: [id number] Date of Birth/Sex: Treating RN: 10/27/1975 (45 y.o. [name] Primary Care Provider: PCP, NO Other Clinician: Referring Provider: Treating Provider/Extender: Chesley Mires in Treatment: 3 Diagnosis Coding ICD-10 Codes Code Description [phone] Non-pressure chronic ulcer of left ankle with other specified severity L03.116 Cellulitis of left lower limb I87.332 Chronic venous hypertension (idiopathic) with ulcer and inflammation of left lower extremity Facility Procedures CPT4 Code Description Modifier Quantity [id number] (Facility Use Only) [phone] - APPLY MULTLAY COMPRS LWR LT LEG 1 Electronic Signature(s) Signed: 02/27/2021 1:14:47 PM By: Shawn Stall Signed: 02/27/2021 1:42:00 PM By: Geralyn Corwin DO Entered By: Shawn Stall on 02/27/2021 13:02:27

## 2021-02-28 LAB — AEROBIC CULTURE W GRAM STAIN (SUPERFICIAL SPECIMEN): Gram Stain: NONE SEEN

## 2021-03-02 ENCOUNTER — Other Ambulatory Visit (HOSPITAL_COMMUNITY): Payer: Self-pay | Admitting: Internal Medicine

## 2021-03-02 DIAGNOSIS — I87333 Chronic venous hypertension (idiopathic) with ulcer and inflammation of bilateral lower extremity: Secondary | ICD-10-CM

## 2021-03-02 DIAGNOSIS — M79605 Pain in left leg: Secondary | ICD-10-CM

## 2021-03-02 DIAGNOSIS — Z419 Encounter for procedure for purposes other than remedying health state, unspecified: Secondary | ICD-10-CM | POA: Diagnosis not present

## 2021-03-02 DIAGNOSIS — M79604 Pain in right leg: Secondary | ICD-10-CM

## 2021-03-05 ENCOUNTER — Encounter (HOSPITAL_BASED_OUTPATIENT_CLINIC_OR_DEPARTMENT_OTHER): Payer: Medicaid Other | Admitting: Internal Medicine

## 2021-03-05 ENCOUNTER — Ambulatory Visit (HOSPITAL_COMMUNITY)
Admission: RE | Admit: 2021-03-05 | Discharge: 2021-03-05 | Disposition: A | Discharge status: Home / Self Care | Payer: Medicaid Other | Source: Ambulatory Visit | Attending: Infectious Diseases | Admitting: Infectious Diseases

## 2021-03-05 ENCOUNTER — Other Ambulatory Visit: Payer: Self-pay

## 2021-03-05 ENCOUNTER — Ambulatory Visit (INDEPENDENT_AMBULATORY_CARE_PROVIDER_SITE_OTHER)
Admission: RE | Admit: 2021-03-05 | Discharge: 2021-03-05 | Disposition: A | Discharge status: Home / Self Care | Payer: Medicaid Other | Source: Ambulatory Visit | Attending: Infectious Diseases | Admitting: Infectious Diseases

## 2021-03-05 DIAGNOSIS — M79604 Pain in right leg: Secondary | ICD-10-CM | POA: Insufficient documentation

## 2021-03-05 DIAGNOSIS — M79605 Pain in left leg: Secondary | ICD-10-CM

## 2021-03-05 DIAGNOSIS — I87333 Chronic venous hypertension (idiopathic) with ulcer and inflammation of bilateral lower extremity: Secondary | ICD-10-CM

## 2021-03-06 ENCOUNTER — Encounter (HOSPITAL_BASED_OUTPATIENT_CLINIC_OR_DEPARTMENT_OTHER): Payer: Medicaid Other | Attending: Internal Medicine | Admitting: Internal Medicine

## 2021-03-06 DIAGNOSIS — M19072 Primary osteoarthritis, left ankle and foot: Secondary | ICD-10-CM | POA: Insufficient documentation

## 2021-03-06 DIAGNOSIS — F17200 Nicotine dependence, unspecified, uncomplicated: Secondary | ICD-10-CM | POA: Insufficient documentation

## 2021-03-06 DIAGNOSIS — M8580 Other specified disorders of bone density and structure, unspecified site: Secondary | ICD-10-CM | POA: Diagnosis not present

## 2021-03-06 DIAGNOSIS — I87332 Chronic venous hypertension (idiopathic) with ulcer and inflammation of left lower extremity: Secondary | ICD-10-CM | POA: Insufficient documentation

## 2021-03-06 DIAGNOSIS — L97529 Non-pressure chronic ulcer of other part of left foot with unspecified severity: Secondary | ICD-10-CM | POA: Diagnosis present

## 2021-03-06 DIAGNOSIS — L03116 Cellulitis of left lower limb: Secondary | ICD-10-CM | POA: Diagnosis not present

## 2021-03-06 DIAGNOSIS — L97328 Non-pressure chronic ulcer of left ankle with other specified severity: Secondary | ICD-10-CM | POA: Insufficient documentation
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
## 2021-03-10 ENCOUNTER — Other Ambulatory Visit: Payer: Self-pay

## 2021-03-10 ENCOUNTER — Encounter (HOSPITAL_BASED_OUTPATIENT_CLINIC_OR_DEPARTMENT_OTHER): Payer: Medicaid Other | Admitting: Physician Assistant

## 2021-03-10 DIAGNOSIS — L03116 Cellulitis of left lower limb: Secondary | ICD-10-CM | POA: Diagnosis not present

## 2021-03-10 DIAGNOSIS — L97328 Non-pressure chronic ulcer of left ankle with other specified severity: Secondary | ICD-10-CM | POA: Diagnosis not present

## 2021-03-10 DIAGNOSIS — M8580 Other specified disorders of bone density and structure, unspecified site: Secondary | ICD-10-CM | POA: Diagnosis not present

## 2021-03-10 DIAGNOSIS — L97322 Non-pressure chronic ulcer of left ankle with fat layer exposed: Secondary | ICD-10-CM | POA: Diagnosis not present

## 2021-03-10 DIAGNOSIS — I87332 Chronic venous hypertension (idiopathic) with ulcer and inflammation of left lower extremity: Secondary | ICD-10-CM | POA: Diagnosis not present

## 2021-03-10 DIAGNOSIS — F17200 Nicotine dependence, unspecified, uncomplicated: Secondary | ICD-10-CM | POA: Diagnosis not present

## 2021-03-10 DIAGNOSIS — M19072 Primary osteoarthritis, left ankle and foot: Secondary | ICD-10-CM | POA: Diagnosis not present

## 2021-03-10 NOTE — Progress Notes (Addendum)
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 03/11/2021 08:12:06 -------------------------------------------------------------------------------- SuperBill Details Patient Name: Date of Service: [name] 03/10/2021 Medical Record Number: [id number] Patient Account Number: [id number] Date of Birth/Sex: Treating RN: 1975/08/28 (45 y.o. [name] Primary Care Provider: PCP, NO Other Clinician: Referring Provider: Treating Provider/Extender: [name] in Treatment: 4 Diagnosis Coding ICD-10 Codes Code Description [phone] Non-pressure chronic ulcer of left ankle with other specified severity L03.116 Cellulitis of left lower limb I87.332 Chronic venous hypertension (idiopathic) with ulcer and inflammation of left lower extremity Facility Procedures CPT4 Code: [id number] Description: 11042 - DEB SUBQ TISSUE 20 SQ CM/< ICD-10 Diagnosis Description L97.328 Non-pressure chronic ulcer of left ankle with  other specified severity Modifier: Quantity: 1 CPT4 Code: [id number] Description: 11045 - DEB SUBQ TISS EA ADDL 20CM ICD-10 Diagnosis Description L97.328 Non-pressure chronic ulcer of left ankle with other specified severity Modifier: Quantity: 2 Physician Procedures : CPT4 Code Description Modifier [id number] 11042 - WC PHYS SUBQ TISS 20 SQ CM ICD-10 Diagnosis Description L97.328 Non-pressure chronic ulcer of left ankle with other specified severity Quantity: 1 : [id number] 11045 - WC PHYS SUBQ TISS EA ADDL 20 CM ICD-10 Diagnosis Description L97.328 Non-pressure chronic ulcer of left ankle with other specified severity Quantity: 2 Electronic Signature(s) Signed: 03/11/2021 8:11:24 AM By: [name] Kelp PA-C Previous Signature: 03/10/2021 5:35:30 PM Version By: [name] Mu RN Entered By: [name] on 03/11/2021 08:11:24

## 2021-03-17 ENCOUNTER — Ambulatory Visit (INDEPENDENT_AMBULATORY_CARE_PROVIDER_SITE_OTHER): Payer: Medicaid Other | Admitting: Internal Medicine

## 2021-03-17 ENCOUNTER — Telehealth: Payer: Self-pay

## 2021-03-17 ENCOUNTER — Other Ambulatory Visit: Payer: Self-pay

## 2021-03-17 ENCOUNTER — Encounter: Payer: Self-pay | Admitting: Internal Medicine

## 2021-03-17 ENCOUNTER — Encounter (HOSPITAL_BASED_OUTPATIENT_CLINIC_OR_DEPARTMENT_OTHER): Payer: Medicaid Other | Admitting: Internal Medicine

## 2021-03-17 VITALS — BP 126/74 | HR 76 | Temp 98.0°F | Wt 149.0 lb

## 2021-03-17 DIAGNOSIS — Z5181 Encounter for therapeutic drug level monitoring: Secondary | ICD-10-CM

## 2021-03-17 DIAGNOSIS — L98492 Non-pressure chronic ulcer of skin of other sites with fat layer exposed: Secondary | ICD-10-CM

## 2021-03-17 MED ORDER — CLARITHROMYCIN 500 MG PO TABS: 500.0000 mg | ORAL_TABLET | Freq: Two times a day (BID) | ORAL | 1 refills | Status: DC

## 2021-03-17 MED ORDER — SULFAMETHOXAZOLE-TRIMETHOPRIM 800-160 MG PO TABS: 1.0000 | ORAL_TABLET | Freq: Two times a day (BID) | ORAL | 1 refills | Status: DC

## 2021-03-17 NOTE — Progress Notes (Signed)
   Subjective:    Patient ID: [name], male    DOB: 14-Mar-1976, 45 y.o.   MRN: [id number]  HPI Here for follow up of his lower extremity ulcer He has had some mild improvement of his ulcer in size per the wound care notes.  Pathology noted from 7/27 that has been done and c/w granulomatous changes and nothing specific.  He missed his most recent wound care appointment.  He has been taking the clarithromycin and Bactrim and now out.  No associated rash or diarrhea.  Here with a Nurse, learning disability.  No fever.    Review of Systems  Constitutional:  Negative for chills, fever and unexpected weight change.  Gastrointestinal:  Negative for diarrhea and nausea.  Skin:  Negative for rash.      Objective:   Physical Exam Eyes:     General: No scleral icterus. Musculoskeletal:     Comments: Left leg ulcer looks about the same, less purulence, still malodorous  Neurological:     General: No focal deficit present.     Mental Status: He is alert.    SH: + tobacco      Assessment & Plan:

## 2021-03-17 NOTE — Telephone Encounter (Signed)
Patient requested CMA reach out to wound care to reschedule missed appointment for today. Per wound care patient was late for visit today and not able to be seen. Case Manager apparently wrote down incorrect time. Resulting in missed appt. Wound care provided number for case manager to ensure she had correct appt info. According to RN this has happened 3-4 times already. Patient has an appt already scheduled for 8/23 at 9:15.  CMA reached out to case manager to ensure appointment time was written down correctly so patient does not miss another visit. Spoke with case manger to relay appointment dates/time. Confirmed she had them correct.  Case manager number: [phone]. Juanita Laster, RMA

## 2021-03-17 NOTE — Progress Notes (Signed)
MARSHEL, GOLUBSKI ([id number]) Visit Report for 03/06/2021 Arrival Information Details Patient Name: Date of Service: [name] [place] NIE 03/06/2021 8:00 [name] M Medical Record Number: [id number] Patient Account Number: [id number] Date of Birth/Sex: Treating RN: [birth year] (45 y.o. [name] Primary Care [name]: PCP, NO Other Clinician: Referring Kashon Kraynak: Treating Juanna Pudlo/Extender: Chesley Mires in Treatment: 4 Visit Information History Since Last Visit Added or deleted any medications: No Patient Arrived: Ambulatory Any new allergies or adverse reactions: No Arrival Time: 07:54 Had [name] fall or experienced change in No Accompanied By: case worker activities of daily living that may affect Transfer Assistance: None risk of falls: Patient Identification Verified: Yes Signs or symptoms of abuse/neglect since last visito No Secondary Verification Process Completed: Yes Hospitalized since last visit: No Patient Requires Transmission-Based Precautions: No Implantable device outside of the clinic excluding No Patient Has Alerts: No cellular tissue based products placed in the center since last visit: Has Dressing in Place as Prescribed: Yes Has Compression in Place as Prescribed: Yes Pain Present Now: Yes Electronic Signature(s) Signed: 03/06/2021 12:35:09 PM By: Zandra Abts RN, BSN Entered By: Zandra Abts on 03/06/2021 07:56:24 -------------------------------------------------------------------------------- Compression Therapy Details Patient Name: Date of Service: [name] 03/06/2021 8:00 [name] M Medical Record Number: [id number] Patient Account Number: [id number] Date of Birth/Sex: Treating RN: [birth year] (45 y.o. [name] Primary Care [name]: PCP, NO Other Clinician: Referring Ott Zimmerle: Treating Jahniah Pallas/Extender: Chesley Mires in Treatment: 4 Compression Therapy Performed for Wound Assessment: Wound #1  Left Ankle Performed By: Clinician Zandra Abts, RN Compression Type: Three Layer Post Procedure Diagnosis Same as Pre-procedure Electronic Signature(s) Signed: 03/06/2021 12:35:09 PM By: Zandra Abts RN, BSN Entered By: Zandra Abts on 03/06/2021 08:45:02 -------------------------------------------------------------------------------- Encounter Discharge Information Details Patient Name: Date of Service: [name], [name] [name] 03/06/2021 8:00 [name] M Medical Record Number: [id number] Patient Account Number: [id number] Date of Birth/Sex: Treating RN: 09/27/1975 (46 y.o. [name] Primary Care [name]: PCP, NO Other Clinician: Referring Braidan Ricciardi: Treating [name]/Extender: Chesley Mires in Treatment: 4 Encounter Discharge Information Items Discharge Condition: Stable Ambulatory Status: Ambulatory Discharge Destination: Home Transportation: Private Auto Accompanied By: case worker Schedule Follow-up Appointment: Yes Clinical Summary of Care: Patient Declined Electronic Signature(s) Signed: 03/06/2021 12:35:09 PM By: Zandra Abts RN, BSN Entered By: Zandra Abts on 03/06/2021 12:16:48 -------------------------------------------------------------------------------- Lower Extremity Assessment Details Patient Name: Date of Service: [name] 03/06/2021 8:00 [name] M Medical Record Number: [id number] Patient Account Number: [id number] Date of Birth/Sex: Treating RN: 1976-07-07 (45 y.o. [name] Primary Care [name]: PCP, NO Other Clinician: Referring Hayle Parisi: Treating [name]/Extender: Chesley Mires in Treatment: 4 Edema Assessment Assessed: [Left: No] [Right: No] Edema: [Left: N] [Right: o] Calf Left: Right: Point of Measurement: 28 cm From Medial Instep 31 cm Ankle Left: Right: Point of Measurement: 8 cm From Medial Instep 22.5 cm Vascular Assessment Pulses: Dorsalis Pedis Palpable:  [Left:Yes] Electronic Signature(s) Signed: 03/06/2021 12:35:09 PM By: Zandra Abts RN, BSN Entered By: Zandra Abts on 03/06/2021 07:59:13 -------------------------------------------------------------------------------- Multi Wound Chart Details Patient Name: Date of Service: [name], [name] [name] 03/06/2021 8:00 [name] M Medical Record Number: [id number] Patient Account Number: [id number] Date of Birth/Sex: Treating RN: [birth year] (45 y.o. [name] Primary Care [name]: PCP, NO Other Clinician: Referring [name]: Treating [name]/Extender: Chesley Mires in Treatment: 4 Vital Signs Height(in): Pulse(bpm): 61 Weight(lbs): Blood Pressure(mmHg): 101/67 Body Mass Index(BMI): Temperature(F): 98.1 Respiratory Rate(breaths/min): 16  Photos: [[name]:[name]] Left Ankle [name] [name] Wound Location: Trauma [name] [name] Wounding Event: Trauma, Other [name] [name] Primary Etiology: 10/14/2020 [name] [name] Date Acquired: 4 [name] [name] Weeks of Treatment: Open [name] [name] Wound Status: 5.3x11x0.7 [name] [name] Measurements L x W x D (cm) 45.789 [name] [name] [name] (cm) : rea 32.052 [name] [name] Volume (cm) : 25.60% [name] [name] % Reduction in [name] rea: -30.10% [name] [name] % Reduction in Volume: Full Thickness Without Exposed [name] [name] Classification: Support Structures Large [name] [name] Exudate Amount: Purulent [name] [name] Exudate Type: yellow, brown, green [name] [name] Exudate Color: Yes [name] [name] Foul Odor [name] Cleansing: fter No [name] [name] Odor Anticipated Due to Product Use: Well defined, not attached [name] [name] Wound Margin: Medium (34-66%) [name] [name] Granulation [name] mount: Red, Pink [name] [name] Granulation Quality: Medium (34-66%) [name] [name] Necrotic Amount: Fat Layer (Subcutaneous Tissue): Yes [name] [name] Exposed Structures: Fascia: No Tendon: No Muscle: No Joint: No Bone: No None [name] [name] Epithelialization: Compression Therapy [name] [name] Procedures Performed: Treatment Notes Electronic Signature(s) Signed: 03/06/2021  9:35:38 AM By: Geralyn Corwin DO Signed: 03/06/2021 1:36:18 PM By: [name] Entered By: Geralyn Corwin on 03/06/2021 09:29:00 -------------------------------------------------------------------------------- Multi-Disciplinary Care Plan Details Patient Name: Date of Service: [name], [name] [name] 03/06/2021 8:00 [name] M Medical Record Number: [id number] Patient Account Number: [id number] Date of Birth/Sex: Treating RN: 1975-11-19 (45 y.o. [name] Primary Care Jonise Weightman: PCP, NO Other Clinician: Referring Jhoan Schmieder: Treating Lacrystal Barbe/Extender: Chesley Mires in Treatment: 4 Active Inactive Wound/Skin Impairment Nursing Diagnoses: Knowledge deficit related to ulceration/compromised skin integrity Goals: Patient/caregiver will verbalize understanding of skin care regimen Date Initiated: 02/05/2021 Target Resolution Date: 04/03/2021 Goal Status: Active Interventions: Assess patient/caregiver ability to obtain necessary supplies Assess patient/caregiver ability to perform ulcer/skin care regimen upon admission and as needed Provide education on ulcer and skin care Treatment Activities: Skin care regimen initiated : 02/05/2021 Topical wound management initiated : 02/05/2021 Notes: Electronic Signature(s) Signed: 03/06/2021 12:35:09 PM By: Zandra Abts RN, BSN Entered By: Zandra Abts on 03/06/2021 08:05:11 -------------------------------------------------------------------------------- Pain Assessment Details Patient Name: Date of Service: [name] 03/06/2021 8:00 [name] M Medical Record Number: [id number] Patient Account Number: [id number] Date of Birth/Sex: Treating RN: 03-Mar-1976 (45 y.o. [name] Primary Care Laelynn Blizzard: PCP, NO Other Clinician: Referring Roverto Bodmer: Treating Jaimi Belle/Extender: Chesley Mires in Treatment: 4 Active Problems Location of Pain Severity and Description of Pain Patient Has Paino  Yes Site Locations Pain Location: Pain Location: Pain in Ulcers Rate the pain. Current Pain Level: 6 Character of Pain Describe the Pain: Difficult to Pinpoint Pain Management and Medication Current Pain Management: Medication: Yes Cold Application: No Rest: No Massage: No Activity: No T.E.N.S.: No Heat Application: No Leg drop or elevation: No Is the Current Pain Management Adequate: Adequate How does your wound impact your activities of daily livingo Sleep: No Bathing: No Appetite: No Relationship With Others: No Bladder Continence: No Emotions: No Bowel Continence: No Work: No Toileting: No Drive: No Dressing: No Hobbies: No Electronic Signature(s) Signed: 03/06/2021 12:35:09 PM By: Zandra Abts RN, BSN Entered By: Zandra Abts on 03/06/2021 07:57:25 -------------------------------------------------------------------------------- Patient/Caregiver Education Details Patient Name: Date of Service: [name] 8/5/2022andnbsp8:00 [name] M Medical Record Number: [id number] Patient Account Number: [id number] Date of Birth/Gender: Treating RN: 1976-06-23 (45 y.o. [name] Primary Care Physician: PCP, NO Other Clinician: Referring Physician: Treating Physician/Extender: Chesley Mires in Treatment: 4 Education Assessment Education Provided To: Patient Education Topics Provided  Wound/Skin Impairment: Methods: Explain/Verbal Responses: State content correctly Electronic Signature(s) Signed: 03/06/2021 12:35:09 PM By: Zandra Abts RN, BSN Entered By: Zandra Abts on 03/06/2021 08:05:24 -------------------------------------------------------------------------------- Wound Assessment Details Patient Name: Date of Service: [name], [name] [name] 03/06/2021 8:00 [name] M Medical Record Number: [id number] Patient Account Number: [id number] Date of Birth/Sex: Treating RN: [birth year] (45 y.o. [name] Primary Care Genita Nilsson:  PCP, NO Other Clinician: Referring Yassmin Binegar: Treating Ki Corbo/Extender: Chesley Mires in Treatment: 4 Wound Status Wound Number: 1 Primary Etiology: Trauma, Other Wound Location: Left Ankle Wound Status: Open Wounding Event: Trauma Date Acquired: 10/14/2020 Weeks Of Treatment: 4 Clustered Wound: No Photos Wound Measurements Length: (cm) 5.3 Width: (cm) 11 Depth: (cm) 0.7 Area: (cm) 45.789 Volume: (cm) 32.052 % Reduction in Area: 25.6% % Reduction in Volume: -30.1% Epithelialization: None Tunneling: No Undermining: No Wound Description Classification: Full Thickness Without Exposed Support Structures Wound Margin: Well defined, not attached Exudate Amount: Large Exudate Type: Purulent Exudate Color: yellow, brown, green Foul Odor After Cleansing: Yes Due to Product Use: No Slough/Fibrino Yes Wound Bed Granulation Amount: Medium (34-66%) Exposed Structure Granulation Quality: Red, Pink Fascia Exposed: No Necrotic Amount: Medium (34-66%) Fat Layer (Subcutaneous Tissue) Exposed: Yes Necrotic Quality: Adherent Slough Tendon Exposed: No Muscle Exposed: No Joint Exposed: No Bone Exposed: No Electronic Signature(s) Signed: 03/06/2021 12:35:09 PM By: Zandra Abts RN, BSN Signed: 03/17/2021 11:33:27 AM By: Karl Ito Entered By: Karl Ito on 03/06/2021 08:06:25 -------------------------------------------------------------------------------- Vitals Details Patient Name: Date of Service: [name], [name] [name] 03/06/2021 8:00 [name] M Medical Record Number: [id number] Patient Account Number: [id number] Date of Birth/Sex: Treating RN: 31-Jul-1976 (45 y.o. [name] Primary Care Dalante Minus: PCP, NO Other Clinician: Referring Theo Krumholz: Treating Demorris Choyce/Extender: Chesley Mires in Treatment: 4 Vital Signs Time Taken: 07:56 Temperature (F): 98.1 Pulse (bpm): 61 Respiratory Rate (breaths/min): 16 Blood Pressure  (mmHg): 101/67 Reference Range: 80 - 120 mg / dl Electronic Signature(s) Signed: 03/06/2021 12:35:09 PM By: Zandra Abts RN, BSN Entered By: Zandra Abts on 03/06/2021 07:57:07

## 2021-03-17 NOTE — Progress Notes (Signed)
[name], [name] ([id number]) Visit Report for 03/10/2021 Arrival Information Details Patient Name: Date of Service: [name] [place] NIE 03/10/2021 9:00 A M Medical Record Number: [id number] Patient Account Number: [id number] Date of Birth/Sex: Treating RN: 1975/09/11 (45 y.o. [name] Primary Care Sukhman Kocher: PCP, NO Other Clinician: Referring Jeanell Mangan: Treating Carles Florea/Extender: Eulah Pont in Treatment: 4 Visit Information History Since Last Visit Added or deleted any medications: No Patient Arrived: Ambulatory Any new allergies or adverse reactions: No Arrival Time: 08:06 Had a fall or experienced change in No Transfer Assistance: None activities of daily living that may affect Patient Identification Verified: Yes risk of falls: Secondary Verification Process Completed: Yes Signs or symptoms of abuse/neglect since last visito No Patient Requires Transmission-Based Precautions: No Hospitalized since last visit: No Patient Has Alerts: No Implantable device outside of the clinic excluding No cellular tissue based products placed in the center since last visit: Has Dressing in Place as Prescribed: Yes Has Compression in Place as Prescribed: Yes Pain Present Now: Yes Electronic Signature(s) Signed: 03/10/2021 5:39:13 PM By: [name] Entered By: [name] on 03/10/2021 08:06:30 -------------------------------------------------------------------------------- Compression Therapy Details Patient Name: Date of Service: [name] 03/10/2021 9:00 A M Medical Record Number: [id number] Patient Account Number: [id number] Date of Birth/Sex: Treating RN: 12/21/1975 (45 y.o. [name] Primary Care Degan Hanser: PCP, NO Other Clinician: Referring Briya Lookabaugh: Treating Donnis Pecha/Extender: Eulah Pont in Treatment: 4 Compression Therapy Performed for Wound Assessment: Wound #1 Left Ankle Performed By: Clinician  Fonnie Mu, RN Compression Type: Three Layer Post Procedure Diagnosis Same as Pre-procedure Electronic Signature(s) Signed: 03/10/2021 5:35:30 PM By: Fonnie Mu RN Entered By: Fonnie Mu on 03/10/2021 09:38:23 -------------------------------------------------------------------------------- Encounter Discharge Information Details Patient Name: Date of Service: [name] 03/10/2021 9:00 A M Medical Record Number: [id number] Patient Account Number: [id number] Date of Birth/Sex: Treating RN: [birth year] (45 y.o. [name] Primary Care Jazmaine Fuelling: PCP, NO Other Clinician: Referring Tashay Bozich: Treating Peri Kreft/Extender: Eulah Pont in Treatment: 4 Encounter Discharge Information Items Post Procedure Vitals Discharge Condition: Stable Temperature (F): 98.8 Ambulatory Status: Ambulatory Pulse (bpm): 76 Discharge Destination: Home Respiratory Rate (breaths/min): 16 Transportation: Private Auto Blood Pressure (mmHg): 112/70 Schedule Follow-up Appointment: Yes Clinical Summary of Care: Provided on 03/10/2021 Form Type Recipient Paper Patient Patient Electronic Signature(s) Signed: 03/10/2021 9:40:46 AM By: [name] Entered By: [name] on 03/10/2021 09:40:46 -------------------------------------------------------------------------------- Lower Extremity Assessment Details Patient Name: Date of Service: [name] 03/10/2021 9:00 A M Medical Record Number: [id number] Patient Account Number: [id number] Date of Birth/Sex: Treating RN: 16-Nov-1975 (45 y.o. [name] Primary Care Kalla Watson: PCP, NO Other Clinician: Referring Delbert Vu: Treating Taleyah Hillman/Extender: Eulah Pont in Treatment: 4 Edema Assessment Assessed: [Left: Yes] [Right: No] Edema: [Left: N] [Right: o] Calf Left: Right: Point of Measurement: 28 cm From Medial Instep 30.5 cm Ankle Left: Right: Point of  Measurement: 8 cm From Medial Instep 21.8 cm Vascular Assessment Pulses: Dorsalis Pedis Palpable: [Left:Yes] Electronic Signature(s) Signed: 03/10/2021 5:39:13 PM By: [name] Entered By: [name] on 03/10/2021 08:08:56 -------------------------------------------------------------------------------- Multi-Disciplinary Care Plan Details Patient Name: Date of Service: [name] 03/10/2021 9:00 A M Medical Record Number: [id number] Patient Account Number: [id number] Date of Birth/Sex: Treating RN: [birth year] (45 y.o. [name] Primary Care Yasira Engelson: PCP, NO Other Clinician: Referring Athanasios Heldman: Treating Kel Senn/Extender: Eulah Pont in Treatment: 4 Active Inactive Wound/Skin  Impairment Nursing Diagnoses: Knowledge deficit related to ulceration/compromised skin integrity Goals: Patient/caregiver will verbalize understanding of skin care regimen Date Initiated: 02/05/2021 Target Resolution Date: 04/03/2021 Goal Status: Active Interventions: Assess patient/caregiver ability to obtain necessary supplies Assess patient/caregiver ability to perform ulcer/skin care regimen upon admission and as needed Provide education on ulcer and skin care Treatment Activities: Skin care regimen initiated : 02/05/2021 Topical wound management initiated : 02/05/2021 Notes: Electronic Signature(s) Signed: 03/10/2021 5:35:30 PM By: Fonnie Mu RN Entered By: Fonnie Mu on 03/10/2021 09:33:18 -------------------------------------------------------------------------------- Pain Assessment Details Patient Name: Date of Service: [name] 03/10/2021 9:00 A M Medical Record Number: [id number] Patient Account Number: [id number] Date of Birth/Sex: Treating RN: [birth year] (45 y.o. [name] Primary Care Farhan Jean: PCP, NO Other Clinician: Referring Deonne Rooks: Treating Syenna Nazir/Extender: Eulah Pont in  Treatment: 4 Active Problems Location of Pain Severity and Description of Pain Patient Has Paino Yes Site Locations Pain Location: Pain Location: Pain in Ulcers With Dressing Change: Yes Duration of the Pain. Constant / Intermittento Intermittent Rate the pain. Current Pain Level: 8 Character of Pain Describe the Pain: Tender, Throbbing Pain Management and Medication Current Pain Management: Medication: No Cold Application: No Rest: Yes Massage: No Activity: No T.E.N.S.: No Heat Application: No Leg drop or elevation: No Is the Current Pain Management Adequate: Inadequate How does your wound impact your activities of daily livingo Sleep: No Bathing: No Appetite: No Relationship With Others: No Bladder Continence: No Emotions: No Bowel Continence: No Work: No Toileting: No Drive: No Dressing: No Hobbies: No Electronic Signature(s) Signed: 03/10/2021 5:39:13 PM By: [name] Entered By: [name] on 03/10/2021 08:07:25 -------------------------------------------------------------------------------- Patient/Caregiver Education Details Patient Name: Date of Service: [name] 8/9/2022andnbsp9:00 A M Medical Record Number: [id number] Patient Account Number: [id number] Date of Birth/Gender: Treating RN: 11-Jan-1976 (45 y.o. [name] Primary Care Physician: PCP, NO Other Clinician: Referring Physician: Treating Physician/Extender: Eulah Pont in Treatment: 4 Education Assessment Education Provided To: Patient Education Topics Provided Wound/Skin Impairment: Methods: Explain/Verbal Responses: State content correctly Electronic Signature(s) Signed: 03/10/2021 5:35:30 PM By: Fonnie Mu RN Entered By: Fonnie Mu on 03/10/2021 09:33:33 -------------------------------------------------------------------------------- Wound Assessment Details Patient Name: Date of Service: [name]  03/10/2021 9:00 A M Medical Record Number: [id number] Patient Account Number: [id number] Date of Birth/Sex: Treating RN: 19-Nov-1975 (45 y.o. [name] Primary Care Dailynn Nancarrow: PCP, NO Other Clinician: Referring Fajr Fife: Treating Karma Hiney/Extender: Eulah Pont in Treatment: 4 Wound Status Wound Number: 1 Primary Etiology: Trauma, Other Wound Location: Left Ankle Wound Status: Open Wounding Event: Trauma Date Acquired: 10/14/2020 Weeks Of Treatment: 4 Clustered Wound: No Photos Wound Measurements Length: (cm) 5 Width: (cm) 10.5 Depth: (cm) 0.5 Area: (cm) 41.233 Volume: (cm) 20.617 % Reduction in Area: 33% % Reduction in Volume: 16.3% Epithelialization: Small (1-33%) Tunneling: No Undermining: No Wound Description Classification: Full Thickness Without Exposed Support Structures Wound Margin: Distinct, outline attached Exudate Amount: Medium Exudate Type: Serosanguineous Exudate Color: red, brown Foul Odor After Cleansing: No Slough/Fibrino Yes Wound Bed Granulation Amount: Large (67-100%) Exposed Structure Granulation Quality: Red, Pink Fascia Exposed: No Necrotic Amount: Small (1-33%) Fat Layer (Subcutaneous Tissue) Exposed: Yes Necrotic Quality: Adherent Slough Tendon Exposed: No Muscle Exposed: No Joint Exposed: No Bone Exposed: No Treatment Notes Wound #1 (Ankle) Wound Laterality: Left Cleanser Soap and Water Discharge Instruction: May shower and wash wound with dial antibacterial soap and water prior to dressing change. Wound  Cleanser Discharge Instruction: Cleanse the wound with wound cleanser prior to applying a clean dressing using gauze sponges, not tissue or cotton balls. Peri-Wound Care Triamcinolone 15 (g) Discharge Instruction: Use triamcinolone 15 (g) as directed Zinc Oxide Ointment 30g tube Discharge Instruction: Apply Zinc Oxide to periwound with each dressing change Sween Lotion (Moisturizing lotion) Discharge  Instruction: Apply moisturizing lotion as directed Topical Primary Dressing KerraCel Ag Gelling Fiber Dressing, 4x5 in (silver alginate) Discharge Instruction: Apply silver alginate to wound bed as instructed Secondary Dressing ABD Pad, 8x10 Discharge Instruction: Apply over primary dressing as directed. Zetuvit Plus 4x8 in Discharge Instruction: Apply over primary dressing as directed. CarboFLEX Odor Control Dressing, 4x4 in Discharge Instruction: Apply over primary dressing as directed. Secured With Compression Wrap ThreePress (3 layer compression wrap) Discharge Instruction: Apply three layer compression as directed. Compression Stockings Add-Ons Electronic Signature(s) Signed: 03/10/2021 5:39:13 PM By: [name] Signed: 03/17/2021 11:33:27 AM By: Karl Ito Entered By: Karl Ito on 03/10/2021 08:10:33 -------------------------------------------------------------------------------- Vitals Details Patient Name: Date of Service: [name], [name] 03/10/2021 9:00 A M Medical Record Number: [id number] Patient Account Number: [id number] Date of Birth/Sex: Treating RN: Jul 24, 1976 (45 y.o. [name] Primary Care Shiro Ellerman: PCP, NO Other Clinician: Referring Tiphany Fayson: Treating Arcangel Minion/Extender: Eulah Pont in Treatment: 4 Vital Signs Time Taken: 08:06 Temperature (F): 98.8 Pulse (bpm): 76 Respiratory Rate (breaths/min): 16 Blood Pressure (mmHg): 112/70 Reference Range: 80 - 120 mg / dl Electronic Signature(s) Signed: 03/10/2021 5:39:13 PM By: [name] Entered By: [name] on 03/10/2021 08:06:51

## 2021-03-18 ENCOUNTER — Encounter: Payer: Self-pay | Admitting: Internal Medicine

## 2021-03-18 DIAGNOSIS — Z5181 Encounter for therapeutic drug level monitoring: Secondary | ICD-10-CM | POA: Insufficient documentation

## 2021-03-18 LAB — CBC WITH DIFFERENTIAL/PLATELET
Absolute Monocytes: 334 cells/uL (ref 200–950)
Basophils Absolute: 21 cells/uL (ref 0–200)
Basophils Relative: 0.4 %
Eosinophils Absolute: 191 cells/uL (ref 15–500)
Eosinophils Relative: 3.6 %
HCT: 39.7 % (ref 38.5–50.0)
Hemoglobin: 13.5 g/dL (ref 13.2–17.1)
Lymphs Abs: 1791 cells/uL (ref 850–3900)
MCH: 33.3 pg — ABNORMAL HIGH (ref 27.0–33.0)
MCHC: 34 g/dL (ref 32.0–36.0)
MCV: 97.8 fL (ref 80.0–100.0)
MPV: 9.7 fL (ref 7.5–12.5)
Monocytes Relative: 6.3 %
Neutro Abs: 2963 cells/uL (ref 1500–7800)
Neutrophils Relative %: 55.9 %
Platelets: 225 10*3/uL (ref 140–400)
RBC: 4.06 10*6/uL — ABNORMAL LOW (ref 4.20–5.80)
RDW: 12.3 % (ref 11.0–15.0)
Total Lymphocyte: 33.8 %
WBC: 5.3 10*3/uL (ref 3.8–10.8)

## 2021-03-18 LAB — COMPLETE METABOLIC PANEL WITH GFR
AG Ratio: 1.5 (calc) (ref 1.0–2.5)
ALT: 10 U/L (ref 9–46)
AST: 12 U/L (ref 10–40)
Albumin: 3.8 g/dL (ref 3.6–5.1)
Alkaline phosphatase (APISO): 76 U/L (ref 36–130)
BUN: 14 mg/dL (ref 7–25)
CO2: 20 mmol/L (ref 20–32)
Calcium: 9.1 mg/dL (ref 8.6–10.3)
Chloride: 113 mmol/L — ABNORMAL HIGH (ref 98–110)
Creat: 1.02 mg/dL (ref 0.60–1.29)
Globulin: 2.5 g/dL (calc) (ref 1.9–3.7)
Glucose, Bld: 99 mg/dL (ref 65–99)
Potassium: 4 mmol/L (ref 3.5–5.3)
Sodium: 140 mmol/L (ref 135–146)
Total Bilirubin: 0.3 mg/dL (ref 0.2–1.2)
Total Protein: 6.3 g/dL (ref 6.1–8.1)
eGFR: 91 mL/min/{1.73_m2} (ref >=60)

## 2021-03-18 NOTE — Assessment & Plan Note (Signed)
In review of the wound care notes, there was a slight improvement in size of the wound last week and it seems drier on exam today.  He may be making some progress so I will have him continue with Bactrim and clarithromycin for now and continue to monitor.  The pathology reviewed and non-specific  Follow up in 3-4 weeks

## 2021-03-18 NOTE — Assessment & Plan Note (Signed)
Will check creatinine, K

## 2021-03-24 ENCOUNTER — Other Ambulatory Visit: Payer: Self-pay

## 2021-03-24 ENCOUNTER — Encounter (HOSPITAL_BASED_OUTPATIENT_CLINIC_OR_DEPARTMENT_OTHER): Payer: Medicaid Other | Admitting: Internal Medicine

## 2021-03-24 DIAGNOSIS — I87332 Chronic venous hypertension (idiopathic) with ulcer and inflammation of left lower extremity: Secondary | ICD-10-CM | POA: Diagnosis not present

## 2021-03-24 DIAGNOSIS — M19072 Primary osteoarthritis, left ankle and foot: Secondary | ICD-10-CM | POA: Diagnosis not present

## 2021-03-24 DIAGNOSIS — L03116 Cellulitis of left lower limb: Secondary | ICD-10-CM | POA: Diagnosis not present

## 2021-03-24 DIAGNOSIS — M8580 Other specified disorders of bone density and structure, unspecified site: Secondary | ICD-10-CM | POA: Diagnosis not present

## 2021-03-24 DIAGNOSIS — F17200 Nicotine dependence, unspecified, uncomplicated: Secondary | ICD-10-CM | POA: Diagnosis not present

## 2021-03-24 DIAGNOSIS — L97322 Non-pressure chronic ulcer of left ankle with fat layer exposed: Secondary | ICD-10-CM | POA: Diagnosis not present

## 2021-03-24 DIAGNOSIS — L97328 Non-pressure chronic ulcer of left ankle with other specified severity: Secondary | ICD-10-CM | POA: Diagnosis not present

## 2021-03-25 NOTE — Progress Notes (Signed)
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 on 03/24/2021 12:07:27 -------------------------------------------------------------------------------- SuperBill Details Patient Name: Date of Service: [name] 03/24/2021 Medical Record Number: [id number] Patient Account Number: [id number] Date of Birth/Sex: Treating RN: 1976-06-07 (45 y.o. Lucious [name] Primary Care Provider: PCP, NO Other Clinician: Referring Provider: Treating Provider/Extender: Simon Rhein in Treatment: 6 Diagnosis Coding ICD-10 Codes Code Description [phone] Non-pressure chronic ulcer of left ankle with other specified severity L03.116 Cellulitis of left lower limb I87.332 Chronic venous hypertension (idiopathic) with ulcer and inflammation of left lower extremity Facility Procedures CPT4 Code: [id number] Description: 11042 - DEB SUBQ TISSUE 20 SQ CM/<  ICD-10 Diagnosis Description L97.328 Non-pressure chronic ulcer of left ankle with other specified severity Modifier: Quantity: 1 CPT4 Code: 84696295 Description: 11045 - DEB SUBQ TISS EA ADDL 20CM ICD-10 Diagnosis Description L97.328 Non-pressure chronic ulcer of left ankle with other specified severity Modifier: Quantity: 2 Physician Procedures : CPT4 Code Description Modifier [id number] 11042 - WC PHYS SUBQ TISS 20 SQ CM ICD-10 Diagnosis Description L97.328 Non-pressure chronic ulcer of left ankle with other specified severity Quantity: 1 : [id number] 11045 - WC PHYS SUBQ TISS EA ADDL 20 CM ICD-10 Diagnosis Description L97.328 Non-pressure chronic ulcer of left ankle with other specified severity Quantity: 2 Electronic Signature(s) Signed: 03/24/2021 5:31:05 PM By: Fonnie Mu RN Signed: 03/25/2021 8:02:04 AM By: Baltazar Najjar MD Entered By: Fonnie Mu on 03/24/2021 10:39:42

## 2021-03-25 NOTE — Progress Notes (Signed)
STCLAIR, SZYMBORSKI ([id number]) Visit Report for 03/24/2021 Arrival Information Details Patient Name: Date of Service: [name] [place] NIE 03/24/2021 9:15 A M Medical Record Number: [id number] Patient Account Number: [id number] Date of Birth/Sex: Treating RN: [birth year] (45 y.o. [name] Primary Care [name]: PCP, NO Other Clinician: Referring [name]: Treating [name]/Extender: [name] in Treatment: 6 Visit Information History Since Last Visit Added or deleted any medications: No Patient Arrived: Crutches Any new allergies or adverse reactions: No Arrival Time: 09:54 Had a fall or experienced change in No Accompanied By: interpreter activities of daily living that may affect Transfer Assistance: None risk of falls: Patient Identification Verified: Yes Signs or symptoms of abuse/neglect since No Secondary Verification Process Completed: Yes last visito Patient Requires Transmission-Based Precautions: No Hospitalized since last visit: No Patient Has Alerts: No Implantable device outside of the clinic No excluding cellular tissue based products placed in the center since last visit: Has Dressing in Place as Prescribed: Yes Has Footwear/Offloading in Place as Yes Prescribed: Left: Surgical Shoe with Pressure Relief Insole Pain Present Now: No Electronic Signature(s) Signed: 03/24/2021 5:51:35 PM By: [name] Entered By: [name] on 03/24/2021 09:55:17 -------------------------------------------------------------------------------- Compression Therapy Details Patient Name: Date of Service: [name] 03/24/2021 9:15 A M Medical Record Number: [id number] Patient Account Number: [id number] Date of Birth/Sex: Treating RN: 1975/09/12 (45 y.o. [name] Primary Care [name]: PCP, NO Other Clinician: Referring Carola Viramontes: Treating Marven Veley/Extender: [name] in Treatment:  6 Compression Therapy Performed for Wound Assessment: Wound #1 Left Ankle Performed By: Clinician Fonnie Mu, RN Compression Type: Three Layer Post Procedure Diagnosis Same as Pre-procedure Electronic Signature(s) Signed: 03/24/2021 5:31:05 PM By: Fonnie Mu RN Entered By: Fonnie Mu on 03/24/2021 10:37:20 -------------------------------------------------------------------------------- Encounter Discharge Information Details Patient Name: Date of Service: [name] 03/24/2021 9:15 A M Medical Record Number: [id number] Patient Account Number: [id number] Date of Birth/Sex: Treating RN: August 07, 1975 (45 y.o. [name] Primary Care [name]: PCP, NO Other Clinician: Referring Deshonna Trnka: Treating Lavelle Berland/Extender: [name] in Treatment: 6 Encounter Discharge Information Items Post Procedure Vitals Discharge Condition: Stable Temperature (F): 97.8 Ambulatory Status: Crutches Pulse (bpm): 56 Discharge Destination: Home Respiratory Rate (breaths/min): 16 Transportation: Private Auto Blood Pressure (mmHg): 96/62 Accompanied By: Interpreter Schedule Follow-up Appointment: Yes Clinical Summary of Care: Provided on 03/24/2021 Form Type Recipient Paper Patient Patient Electronic Signature(s) Signed: 03/24/2021 11:12:57 AM By: [name] Entered By: [name] on 03/24/2021 11:12:56 -------------------------------------------------------------------------------- Lower Extremity Assessment Details Patient Name: Date of Service: [name] 03/24/2021 9:15 A M Medical Record Number: [id number] Patient Account Number: [id number] Date of Birth/Sex: Treating RN: 18-Aug-1975 (45 y.o. [name] Primary Care [name]: PCP, NO Other Clinician: Referring [name]: Treating [name]/Extender: [name] in Treatment: 6 Edema Assessment Assessed: [name]: Yes] [name]: No] Edema: [Left:  N] [Right: o] Calf Left: Right: Point of Measurement: 28 cm From Medial Instep 30 cm Ankle Left: Right: Point of Measurement: 8 cm From Medial Instep 22 cm Vascular Assessment Pulses: Dorsalis Pedis Palpable: [Left:Yes] Electronic Signature(s) Signed: 03/24/2021 5:51:35 PM By: [name] Entered By: [name] on 03/24/2021 10:06:40 -------------------------------------------------------------------------------- Multi Wound Chart Details Patient Name: Date of Service: [name] 03/24/2021 9:15 A M Medical Record Number: [id number] Patient Account Number: [id number] Date of Birth/Sex: Treating RN: 06/05/1976 (45 y.o. [name] Primary Care Najma Bozarth: PCP, NO Other Clinician: Referring [name]: Treating [name]/Extender: [name],  [name] in Treatment: 6 Vital Signs Height(in): Pulse(bpm): 56 Weight(lbs): Blood Pressure(mmHg): 96/62 Body Mass Index(BMI): Temperature(F): 97.8 Respiratory Rate(breaths/min): 16 Photos: [N/A:N/A] Left Ankle N/A N/A Wound Location: Trauma N/A N/A Wounding Event: Trauma, Other N/A N/A Primary Etiology: 10/14/2020 N/A N/A Date Acquired: 6 N/A N/A Weeks of Treatment: Open N/A N/A Wound Status: 5.3x11.2x0.4 N/A N/A Measurements L x W x D (cm) 46.621 N/A N/A A (cm) : rea 18.648 N/A N/A Volume (cm) : 24.30% N/A N/A % Reduction in A rea: 24.30% N/A N/A % Reduction in Volume: Full Thickness Without Exposed N/A N/A Classification: Support Structures Medium N/A N/A Exudate A mount: Serosanguineous N/A N/A Exudate Type: red, brown N/A N/A Exudate Color: Distinct, outline attached N/A N/A Wound Margin: Large (67-100%) N/A N/A Granulation A mount: Red, Pink N/A N/A Granulation Quality: Small (1-33%) N/A N/A Necrotic A mount: Fat Layer (Subcutaneous Tissue): Yes N/A N/A Exposed Structures: Fascia: No Tendon: No Muscle: No Joint: No Bone: No Small (1-33%) N/A  N/A Epithelialization: Debridement - Excisional N/A N/A Debridement: Pre-procedure Verification/Time Out 10:30 N/A N/A Taken: Lidocaine N/A N/A Pain Control: Subcutaneous, Slough N/A N/A Tissue Debrided: Skin/Subcutaneous Tissue N/A N/A Level: 59.36 N/A N/A Debridement A (sq cm): rea Curette N/A N/A Instrument: Minimum N/A N/A Bleeding: Pressure N/A N/A Hemostasis A chieved: 0 N/A N/A Procedural Pain: 0 N/A N/A Post Procedural Pain: Procedure was tolerated well N/A N/A Debridement Treatment Response: 5.3x11.2x0.4 N/A N/A Post Debridement Measurements L x W x D (cm) 18.648 N/A N/A Post Debridement Volume: (cm) Maceration Noted N/A N/A Assessment Notes: Compression Therapy N/A N/A Procedures Performed: Debridement Treatment Notes Wound #1 (Ankle) Wound Laterality: Left Cleanser Soap and Water Discharge Instruction: May shower and wash wound with dial antibacterial soap and water prior to dressing change. Wound Cleanser Discharge Instruction: Cleanse the wound with wound cleanser prior to applying a clean dressing using gauze sponges, not tissue or cotton balls. Peri-Wound Care Triamcinolone 15 (g) Discharge Instruction: Use triamcinolone 15 (g) as directed Sween Lotion (Moisturizing lotion) Discharge Instruction: Apply moisturizing lotion as directed Topical Primary Dressing Hydrofera Blue Classic Foam, 2x2 in Discharge Instruction: Moisten with saline prior to applying to wound bed Secondary Dressing Woven Gauze Sponge, Non-Sterile 4x4 in Discharge Instruction: Apply over primary dressing as directed. Zetuvit Plus 4x8 in Discharge Instruction: Apply over primary dressing as directed. CarboFLEX Odor Control Dressing, 4x4 in Discharge Instruction: Apply over primary dressing as directed. Secured With Compression Wrap ThreePress (3 layer compression wrap) Discharge Instruction: Apply three layer compression as directed. Compression  Stockings Add-Ons Electronic Signature(s) Signed: 03/24/2021 5:31:05 PM By: Fonnie Mu RN Signed: 03/25/2021 8:02:04 AM By: Baltazar Najjar MD Entered By: Baltazar Najjar on 03/24/2021 11:59:06 -------------------------------------------------------------------------------- Multi-Disciplinary Care Plan Details Patient Name: Date of Service: [name] 03/24/2021 9:15 A M Medical Record Number: [id number] Patient Account Number: [id number] Date of Birth/Sex: Treating RN: 1975-12-24 (45 y.o. [name] Primary Care Everlina Gotts: PCP, NO Other Clinician: Referring Elveria Lauderbaugh: Treating Addley Ballinger/Extender: [name] in Treatment: 6 Active Inactive Wound/Skin Impairment Nursing Diagnoses: Knowledge deficit related to ulceration/compromised skin integrity Goals: Patient/caregiver will verbalize understanding of skin care regimen Date Initiated: 02/05/2021 Target Resolution Date: 03/31/2021 Goal Status: Active Interventions: Assess patient/caregiver ability to obtain necessary supplies Assess patient/caregiver ability to perform ulcer/skin care regimen upon admission and as needed Provide education on ulcer and skin care Treatment Activities: Skin care regimen initiated : 02/05/2021 Topical wound management initiated : 02/05/2021 Notes: Electronic Signature(s) Signed: 03/24/2021 5:31:05 PM By:  Fonnie Mu RN Entered By: Fonnie Mu on 03/24/2021 10:39:13 -------------------------------------------------------------------------------- Pain Assessment Details Patient Name: Date of Service: [name] [place] NIE 03/24/2021 9:15 A M Medical Record Number: [id number] Patient Account Number: [id number] Date of Birth/Sex: Treating RN: [birth year] (45 y.o. [name] Primary Care Kenda Kloehn: PCP, NO Other Clinician: Referring Mendi Constable: Treating Taryn Shellhammer/Extender: [name] in Treatment: 6 Active  Problems Location of Pain Severity and Description of Pain Patient Has Paino No Site Locations Pain Management and Medication Current Pain Management: Electronic Signature(s) Signed: 03/24/2021 5:51:35 PM By: [name] Entered By: [name] on 03/24/2021 09:57:07 -------------------------------------------------------------------------------- Patient/Caregiver Education Details Patient Name: Date of Service: [name] 8/23/2022andnbsp9:15 A M Medical Record Number: [id number] Patient Account Number: [id number] Date of Birth/Gender: Treating RN: 1976-07-30 (45 y.o. [name] Primary Care Physician: PCP, NO Other Clinician: Referring Physician: Treating Physician/Extender: [name] in Treatment: 6 Education Assessment Education Provided To: Patient Education Topics Provided Wound/Skin Impairment: Methods: Explain/Verbal Responses: State content correctly Electronic Signature(s) Signed: 03/24/2021 5:31:05 PM By: Fonnie Mu RN Entered By: Fonnie Mu on 03/24/2021 10:39:25 -------------------------------------------------------------------------------- Wound Assessment Details Patient Name: Date of Service: [name] 03/24/2021 9:15 A M Medical Record Number: [id number] Patient Account Number: [id number] Date of Birth/Sex: Treating RN: Aug 05, 1975 (45 y.o. [name] Primary Care Namir Neto: PCP, NO Other Clinician: Referring Helio Lack: Treating Krystopher Kuenzel/Extender: [name] in Treatment: 6 Wound Status Wound Number: 1 Primary Etiology: Trauma, Other Wound Location: Left Ankle Wound Status: Open Wounding Event: Trauma Date Acquired: 10/14/2020 Weeks Of Treatment: 6 Clustered Wound: No Photos Wound Measurements Length: (cm) 5.3 Width: (cm) 11.2 Depth: (cm) 0.4 Area: (cm) 46.621 Volume: (cm) 18.648 % Reduction in Area: 24.3% % Reduction in Volume:  24.3% Epithelialization: Small (1-33%) Tunneling: No Undermining: No Wound Description Classification: Full Thickness Without Exposed Support Structures Wound Margin: Distinct, outline attached Exudate Amount: Medium Exudate Type: Serosanguineous Exudate Color: red, brown Foul Odor After Cleansing: No Slough/Fibrino Yes Wound Bed Granulation Amount: Large (67-100%) Exposed Structure Granulation Quality: Red, Pink Fascia Exposed: No Necrotic Amount: Small (1-33%) Fat Layer (Subcutaneous Tissue) Exposed: Yes Necrotic Quality: Adherent Slough Tendon Exposed: No Muscle Exposed: No Joint Exposed: No Bone Exposed: No Assessment Notes Maceration Noted Treatment Notes Wound #1 (Ankle) Wound Laterality: Left Cleanser Soap and Water Discharge Instruction: May shower and wash wound with dial antibacterial soap and water prior to dressing change. Wound Cleanser Discharge Instruction: Cleanse the wound with wound cleanser prior to applying a clean dressing using gauze sponges, not tissue or cotton balls. Peri-Wound Care Triamcinolone 15 (g) Discharge Instruction: Use triamcinolone 15 (g) as directed Sween Lotion (Moisturizing lotion) Discharge Instruction: Apply moisturizing lotion as directed Topical Primary Dressing Hydrofera Blue Classic Foam, 2x2 in Discharge Instruction: Moisten with saline prior to applying to wound bed Secondary Dressing Woven Gauze Sponge, Non-Sterile 4x4 in Discharge Instruction: Apply over primary dressing as directed. Zetuvit Plus 4x8 in Discharge Instruction: Apply over primary dressing as directed. CarboFLEX Odor Control Dressing, 4x4 in Discharge Instruction: Apply over primary dressing as directed. Secured With Compression Wrap ThreePress (3 layer compression wrap) Discharge Instruction: Apply three layer compression as directed. Compression Stockings Add-Ons Electronic Signature(s) Signed: 03/24/2021 5:51:35 PM By: [name] Entered By:  [name] on 03/24/2021 10:05:27 -------------------------------------------------------------------------------- Vitals Details Patient Name: Date of Service: [name] 03/24/2021 9:15 A M Medical Record Number: [id number] Patient Account Number: [id number] Date of Birth/Sex: Treating RN: Nov 06, 1975 (45  y.o. Judie PetitM) Antonieta IbaBarnhart, Jodi Primary Care Verniece Encarnacion: Other Clinician: PCP, NO Referring Orson Rho: Treating Canaan Prue/Extender: [name] Rheinobson, Michael Mani, Mario Weeks in Treatment: 6 Vital Signs Time Taken: 09:55 Temperature (F): 97.8 Pulse (bpm): 56 Respiratory Rate (breaths/min): 16 Blood Pressure (mmHg): 96/62 Reference Range: 80 - 120 mg / dl Electronic Signature(s) Signed: 03/24/2021 5:51:35 PM By: Antonieta IbaBarnhart, Jodi Entered By: Antonieta IbaBarnhart, Jodi on 03/24/2021 09:57:01

## 2021-03-31 ENCOUNTER — Encounter (HOSPITAL_BASED_OUTPATIENT_CLINIC_OR_DEPARTMENT_OTHER): Payer: Medicaid Other | Admitting: Internal Medicine

## 2021-03-31 ENCOUNTER — Other Ambulatory Visit: Payer: Self-pay

## 2021-03-31 DIAGNOSIS — L03116 Cellulitis of left lower limb: Secondary | ICD-10-CM | POA: Diagnosis not present

## 2021-03-31 DIAGNOSIS — L97328 Non-pressure chronic ulcer of left ankle with other specified severity: Secondary | ICD-10-CM | POA: Diagnosis not present

## 2021-03-31 DIAGNOSIS — M19072 Primary osteoarthritis, left ankle and foot: Secondary | ICD-10-CM | POA: Diagnosis not present

## 2021-03-31 DIAGNOSIS — L97322 Non-pressure chronic ulcer of left ankle with fat layer exposed: Secondary | ICD-10-CM | POA: Diagnosis not present

## 2021-03-31 DIAGNOSIS — I87332 Chronic venous hypertension (idiopathic) with ulcer and inflammation of left lower extremity: Secondary | ICD-10-CM | POA: Diagnosis not present

## 2021-03-31 DIAGNOSIS — M8580 Other specified disorders of bone density and structure, unspecified site: Secondary | ICD-10-CM | POA: Diagnosis not present

## 2021-03-31 DIAGNOSIS — F17200 Nicotine dependence, unspecified, uncomplicated: Secondary | ICD-10-CM | POA: Diagnosis not present
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
## 2021-04-01 NOTE — Progress Notes (Signed)
JSOEPH, PODESTA ([id number]) Visit Report for 03/31/2021 Arrival Information Details Patient Name: Date of Service: [name] [name] [place] NIE 03/31/2021 10:30 A M Medical Record Number: [id number] Patient Account Number: [id number] Date of Birth/Sex: Treating RN: Jul 07, 1976 (45 y.o. [name] Primary Care [name]: PCP, NO Other Clinician: Referring [name]: Treating [name]/Extender: [name] in Treatment: 7 Visit Information History Since Last Visit Added or deleted any medications: No Patient Arrived: Ambulatory Any new allergies or adverse reactions: No Arrival Time: 10:34 Had a fall or experienced change in No Accompanied By: translator activities of daily living that may affect Transfer Assistance: None risk of falls: Patient Identification Verified: Yes Signs or symptoms of abuse/neglect since last visito No Secondary Verification Process Completed: Yes Hospitalized since last visit: No Patient Requires Transmission-Based Precautions: No Implantable device outside of the clinic excluding No Patient Has Alerts: No cellular tissue based products placed in the center since last visit: Has Dressing in Place as Prescribed: Yes Pain Present Now: Yes Electronic Signature(s) Signed: 03/31/2021 3:39:02 PM By: [name] Entered By: [name] on 03/31/2021 10:35:08 -------------------------------------------------------------------------------- Compression Therapy Details Patient Name: Date of Service: [name] [name] [name] 03/31/2021 10:30 A M Medical Record Number: [id number] Patient Account Number: [id number] Date of Birth/Sex: Treating RN: Dec 04, 1975 (45 y.o. [name] Primary Care [name]: PCP, NO Other Clinician: Referring [name]: Treating [name]/Extender: [name] in Treatment: 7 Compression Therapy Performed for Wound Assessment: Wound #1 Left Ankle Performed By: Clinician [name] Iba, RN Compression Type: Three Layer Post Procedure Diagnosis Same as Pre-procedure Electronic Signature(s) Signed: 03/31/2021 5:23:58 PM By: [name] Entered By: [name] on 03/31/2021 11:10:28 -------------------------------------------------------------------------------- Encounter Discharge Information Details Patient Name: Date of Service: [name] [name] [name] 03/31/2021 10:30 A M Medical Record Number: [id number] Patient Account Number: [id number] Date of Birth/Sex: Treating RN: [birth year] (45 y.o. [name] Primary Care [name]: PCP, NO Other Clinician: Referring [name]: Treating [name]/Extender: [name] in Treatment: 7 Encounter Discharge Information Items Discharge Condition: Stable Ambulatory Status: Crutches Discharge Destination: Home Transportation: Private Auto Accompanied By: Interpreter Schedule Follow-up Appointment: Yes Clinical Summary of Care: Provided on 03/31/2021 Form Type Recipient Paper Patient Patient Electronic Signature(s) Signed: 03/31/2021 5:23:58 PM By: [name] Entered By: [name] on 03/31/2021 11:23:51 -------------------------------------------------------------------------------- Lower Extremity Assessment Details Patient Name: Date of Service: [name] [name] [name] 03/31/2021 10:30 A M Medical Record Number: [id number] Patient Account Number: [id number] Date of Birth/Sex: Treating RN: April 01, 1976 (45 y.o. [name] Primary Care [name]: PCP, NO Other Clinician: Referring [name]: Treating [name]/Extender: [name] in Treatment: 7 Edema Assessment Assessed: [name]: Yes] [name]: No] Edema: [Left: N] [Right: o] Calf Left: Right: Point of Measurement: 28 cm From Medial Instep 30.6 cm Ankle Left: Right: Point of Measurement: 8 cm From Medial Instep 21 cm Vascular Assessment Pulses: Dorsalis Pedis Palpable: [Left:Yes] Electronic  Signature(s) Signed: 03/31/2021 5:23:58 PM By: [name] Entered By: [name] on 03/31/2021 10:43:48 -------------------------------------------------------------------------------- Multi Wound Chart Details Patient Name: Date of Service: [name] [name] [name] 03/31/2021 10:30 A M Medical Record Number: [id number] Patient Account Number: [id number] Date of Birth/Sex: Treating RN: 08/27/1975 (45 y.o. [name] Primary Care [name]: PCP, NO Other Clinician: Referring [name]: Treating [name]/Extender: [name] in Treatment: 7 Vital Signs Height(in): Pulse(bpm): 60 Weight(lbs): Blood Pressure(mmHg): 108/67 Body Mass Index(BMI): Temperature(F): 97.4 Respiratory Rate(breaths/min): 16 Photos: [N/A:N/A] Left Ankle N/A N/A Wound Location: Trauma N/A  N/A Wounding Event: Trauma, Other N/A N/A Primary Etiology: 10/14/2020 N/A N/A Date Acquired: 7 N/A N/A Weeks of Treatment: Open N/A N/A Wound Status: 3.6x9.5x0.2 N/A N/A Measurements L x W x D (cm) 26.861 N/A N/A A (cm) : rea 5.372 N/A N/A Volume (cm) : 56.40% N/A N/A % Reduction in A rea: 78.20% N/A N/A % Reduction in Volume: Full Thickness Without Exposed N/A N/A Classification: Support Structures Medium N/A N/A Exudate Amount: Serosanguineous N/A N/A Exudate Type: red, brown N/A N/A Exudate Color: Distinct, outline attached N/A N/A Wound Margin: Large (67-100%) N/A N/A Granulation Amount: Red, Pink N/A N/A Granulation Quality: Small (1-33%) N/A N/A Necrotic Amount: Fat Layer (Subcutaneous Tissue): Yes N/A N/A Exposed Structures: Fascia: No Tendon: No Muscle: No Joint: No Bone: No Medium (34-66%) N/A N/A Epithelialization: Compression Therapy N/A N/A Procedures Performed: Treatment Notes Electronic Signature(s) Signed: 03/31/2021 5:24:19 PM By: Fonnie Mu RN Signed: 04/01/2021 7:53:49 AM By: Baltazar Najjar MD Entered By: Baltazar Najjar on  03/31/2021 11:18:56 -------------------------------------------------------------------------------- Multi-Disciplinary Care Plan Details Patient Name: Date of Service: [name] [name] [name] 03/31/2021 10:30 A M Medical Record Number: [id number] Patient Account Number: [id number] Date of Birth/Sex: Treating RN: 12/24/1975 (45 y.o. [name] Primary Care Dreya Buhrman: PCP, NO Other Clinician: Referring Nataleigh Griffin: Treating Itzamar Traynor/Extender: [name] in Treatment: 7 Active Inactive Wound/Skin Impairment Nursing Diagnoses: Knowledge deficit related to ulceration/compromised skin integrity Goals: Patient/caregiver will verbalize understanding of skin care regimen Date Initiated: 02/05/2021 Target Resolution Date: 04/28/2021 Goal Status: Active Interventions: Assess patient/caregiver ability to obtain necessary supplies Assess patient/caregiver ability to perform ulcer/skin care regimen upon admission and as needed Provide education on ulcer and skin care Treatment Activities: Skin care regimen initiated : 02/05/2021 Topical wound management initiated : 02/05/2021 Notes: 03/31/21: Wound care regimen ongoing, target date extended. Electronic Signature(s) Signed: 03/31/2021 5:23:58 PM By: [name] Entered By: [name] on 03/31/2021 10:45:21 -------------------------------------------------------------------------------- Pain Assessment Details Patient Name: Date of Service: [name] [name] [name] 03/31/2021 10:30 A M Medical Record Number: [id number] Patient Account Number: [id number] Date of Birth/Sex: Treating RN: 1976/06/30 (45 y.o. [name] Primary Care Jezelle Gullick: PCP, NO Other Clinician: Referring Raquon Milledge: Treating Cleston Lautner/Extender: [name] in Treatment: 7 Active Problems Location of Pain Severity and Description of Pain Patient Has Paino Yes Site Locations Rate the pain. Current Pain Level: 4 Pain  Management and Medication Current Pain Management: Electronic Signature(s) Signed: 03/31/2021 3:39:02 PM By: [name] Signed: 03/31/2021 5:24:19 PM By: Fonnie Mu RN Entered By: [name] on 03/31/2021 10:35:17 -------------------------------------------------------------------------------- Patient/Caregiver Education Details Patient Name: Date of Service: [name] [name] [name] 8/30/2022andnbsp10:30 A M Medical Record Number: [id number] Patient Account Number: [id number] Date of Birth/Gender: Treating RN: 1975-09-04 (45 y.o. [name] Primary Care Physician: PCP, NO Other Clinician: Referring Physician: Treating Physician/Extender: [name] in Treatment: 7 Education Assessment Education Provided To: Patient Education Topics Provided Venous: Methods: Explain/Verbal, Printed Responses: State content correctly Wound/Skin Impairment: Methods: Explain/Verbal, Printed Responses: State content correctly Electronic Signature(s) Signed: 03/31/2021 5:23:58 PM By: [name] Entered By: [name] on 03/31/2021 10:45:41 -------------------------------------------------------------------------------- Wound Assessment Details Patient Name: Date of Service: [name] [name] [name] 03/31/2021 10:30 A M Medical Record Number: [id number] Patient Account Number: [id number] Date of Birth/Sex: Treating RN: Jan 08, 1976 (45 y.o. [name] Primary Care Glennys Schorsch: PCP, NO Other Clinician: Referring Mariaeduarda Defranco: Treating Miliana Gangwer/Extender: [name] in Treatment: 7 Wound Status Wound Number: 1 Primary Etiology: Trauma, Other  Wound Location: Left Ankle Wound Status: Open Wounding Event: Trauma Date Acquired: 10/14/2020 Weeks Of Treatment: 7 Clustered Wound: No Photos Wound Measurements Length: (cm) 3.6 Width: (cm) 9.5 Depth: (cm) 0.2 Area: (cm) 26.861 Volume: (cm) 5.372 % Reduction in Area:  56.4% % Reduction in Volume: 78.2% Epithelialization: Medium (34-66%) Tunneling: No Undermining: No Wound Description Classification: Full Thickness Without Exposed Support Structures Wound Margin: Distinct, outline attached Exudate Amount: Medium Exudate Type: Serosanguineous Exudate Color: red, brown Foul Odor After Cleansing: No Slough/Fibrino Yes Wound Bed Granulation Amount: Large (67-100%) Exposed Structure Granulation Quality: Red, Pink Fascia Exposed: No Necrotic Amount: Small (1-33%) Fat Layer (Subcutaneous Tissue) Exposed: Yes Tendon Exposed: No Muscle Exposed: No Joint Exposed: No Bone Exposed: No Treatment Notes Wound #1 (Ankle) Wound Laterality: Left Cleanser Soap and Water Discharge Instruction: May shower and wash wound with dial antibacterial soap and water prior to dressing change. Wound Cleanser Discharge Instruction: Cleanse the wound with wound cleanser prior to applying a clean dressing using gauze sponges, not tissue or cotton balls. Peri-Wound Care Triamcinolone 15 (g) Discharge Instruction: Use triamcinolone 15 (g) as directed Sween Lotion (Moisturizing lotion) Discharge Instruction: Apply moisturizing lotion as directed Topical Primary Dressing Hydrofera Blue Classic Foam, 2x2 in Discharge Instruction: Moisten with saline prior to applying to wound bed Secondary Dressing Woven Gauze Sponge, Non-Sterile 4x4 in Discharge Instruction: Apply over primary dressing as directed. Zetuvit Plus 4x8 in Discharge Instruction: Apply over primary dressing as directed. CarboFLEX Odor Control Dressing, 4x4 in Discharge Instruction: Apply over primary dressing as directed. Secured With Compression Wrap ThreePress (3 layer compression wrap) Discharge Instruction: Apply three layer compression as directed. Compression Stockings Add-Ons Electronic Signature(s) Signed: 03/31/2021 5:23:58 PM By: [name] Entered By: [name] on 03/31/2021  11:08:34 -------------------------------------------------------------------------------- Vitals Details Patient Name: Date of Service: [name] [name] [name] 03/31/2021 10:30 A M Medical Record Number: [id number] Patient Account Number: [id number] Date of Birth/Sex: Treating RN: 14-Oct-1975 (45 y.o. [name] Primary Care Braylyn Eye: PCP, NO Other Clinician: Referring Aritzel Krusemark: Treating Judyann Casasola/Extender: [name] in Treatment: 7 Vital Signs Time Taken: 10:36 Temperature (F): 97.4 Pulse (bpm): 60 Respiratory Rate (breaths/min): 16 Blood Pressure (mmHg): 108/67 Reference Range: 80 - 120 mg / dl Electronic Signature(s) Signed: 03/31/2021 3:39:02 PM By: [name] Entered By: [name] on 03/31/2021 10:37:05

## 2021-04-02 DIAGNOSIS — Z419 Encounter for procedure for purposes other than remedying health state, unspecified: Secondary | ICD-10-CM | POA: Diagnosis not present

## 2021-04-07 ENCOUNTER — Ambulatory Visit (INDEPENDENT_AMBULATORY_CARE_PROVIDER_SITE_OTHER): Payer: Medicaid Other | Admitting: Family Medicine

## 2021-04-07 ENCOUNTER — Other Ambulatory Visit: Payer: Self-pay

## 2021-04-07 ENCOUNTER — Encounter (HOSPITAL_BASED_OUTPATIENT_CLINIC_OR_DEPARTMENT_OTHER): Payer: Medicaid Other | Attending: Internal Medicine | Admitting: Internal Medicine

## 2021-04-07 VITALS — BP 97/60 | HR 56 | Ht 69.0 in | Wt 152.8 lb

## 2021-04-07 DIAGNOSIS — R319 Hematuria, unspecified: Secondary | ICD-10-CM | POA: Diagnosis not present

## 2021-04-07 DIAGNOSIS — L97328 Non-pressure chronic ulcer of left ankle with other specified severity: Secondary | ICD-10-CM | POA: Diagnosis not present

## 2021-04-07 DIAGNOSIS — Z720 Tobacco use: Secondary | ICD-10-CM | POA: Insufficient documentation

## 2021-04-07 DIAGNOSIS — Z550 Illiteracy and low-level literacy: Secondary | ICD-10-CM

## 2021-04-07 DIAGNOSIS — I87332 Chronic venous hypertension (idiopathic) with ulcer and inflammation of left lower extremity: Secondary | ICD-10-CM | POA: Insufficient documentation

## 2021-04-07 DIAGNOSIS — Z0289 Encounter for other administrative examinations: Secondary | ICD-10-CM | POA: Diagnosis not present

## 2021-04-07 LAB — POCT URINALYSIS DIP (MANUAL ENTRY)
Bilirubin, UA: NEGATIVE
Glucose, UA: NEGATIVE mg/dL
Ketones, POC UA: NEGATIVE mg/dL
Nitrite, UA: NEGATIVE
Protein Ur, POC: NEGATIVE mg/dL
Spec Grav, UA: 1.025 (ref 1.010–1.025)
Urobilinogen, UA: 0.2 E.U./dL
pH, UA: 7.5 (ref 5.0–8.0)

## 2021-04-07 LAB — POCT UA - MICROSCOPIC ONLY: RBC, Urine, Miroscopic: >20 (ref 0–2)
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
## 2021-04-07 NOTE — Assessment & Plan Note (Signed)
This is now resolved.  Congratulated on quitting.  Encouraged him to continue complete cessation for his overall health and to continue wound healing.
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
## 2021-04-07 NOTE — Assessment & Plan Note (Signed)
Noted on microscopy today.  Will send for culture.  Will obtain Schistosoma IgG if culture negative.

## 2021-04-07 NOTE — Assessment & Plan Note (Signed)
Routine labs today. Will call with results. Reviewed CXR from health department.

## 2021-04-07 NOTE — Patient Instructions (Addendum)
It was wonderful to see you today.  Please bring ALL of your medications with you to every visit.   Today we talked about: --Going to a dentist--please have your caseworker call   [phone]  Getting blood work which we will review at your visit on September 26      Thank you for choosing [facility].   Please call [phone] with any questions about today's appointment.  Please be sure to schedule follow up at the front  desk before you leave today.   Terisa Starr, MD  Family Medicine

## 2021-04-09 ENCOUNTER — Encounter: Payer: Self-pay | Admitting: Student

## 2021-04-09 DIAGNOSIS — Z227 Latent tuberculosis: Secondary | ICD-10-CM

## 2021-04-09 DIAGNOSIS — K2950 Unspecified chronic gastritis without bleeding: Secondary | ICD-10-CM | POA: Insufficient documentation

## 2021-04-09 HISTORY — DX: Latent tuberculosis: Z22.7

## 2021-04-09 LAB — COMPREHENSIVE METABOLIC PANEL
ALT: 14 IU/L (ref 0–44)
AST: 17 IU/L (ref 0–40)
Albumin/Globulin Ratio: 1.9 (ref 1.2–2.2)
Albumin: 4.3 g/dL (ref 4.0–5.0)
Alkaline Phosphatase: 90 IU/L (ref 44–121)
BUN/Creatinine Ratio: 7 — ABNORMAL LOW (ref 9–20)
BUN: 8 mg/dL (ref 6–24)
Bilirubin Total: 0.3 mg/dL (ref 0.0–1.2)
CO2: 23 mmol/L (ref 20–29)
Calcium: 9.3 mg/dL (ref 8.7–10.2)
Chloride: 105 mmol/L (ref 96–106)
Creatinine, Ser: 1.1 mg/dL (ref 0.76–1.27)
Globulin, Total: 2.3 g/dL (ref 1.5–4.5)
Glucose: 78 mg/dL (ref 65–99)
Potassium: 4.3 mmol/L (ref 3.5–5.2)
Sodium: 141 mmol/L (ref 134–144)
Total Protein: 6.6 g/dL (ref 6.0–8.5)
eGFR: 83 mL/min/{1.73_m2} (ref >59)

## 2021-04-09 LAB — LIPID PANEL
Chol/HDL Ratio: 3.4 ratio (ref 0.0–5.0)
Cholesterol, Total: 118 mg/dL (ref 100–199)
HDL: 35 mg/dL — ABNORMAL LOW (ref >39)
LDL Chol Calc (NIH): 59 mg/dL (ref 0–99)
Triglycerides: 133 mg/dL (ref 0–149)
VLDL Cholesterol Cal: 24 mg/dL (ref 5–40)

## 2021-04-09 LAB — HEPATITIS B SURFACE ANTIBODY, QUANTITATIVE: Hepatitis B Surf Ab Quant: >1000 m[IU]/mL (ref >9.9)

## 2021-04-09 LAB — URINE CULTURE

## 2021-04-09 LAB — HEPATITIS B CORE ANTIBODY, TOTAL: Hep B Core Total Ab: POSITIVE — AB

## 2021-04-09 LAB — CBC WITH DIFFERENTIAL/PLATELET
Basophils Absolute: 0 10*3/uL (ref 0.0–0.2)
Basos: 1 %
EOS (ABSOLUTE): 0.2 10*3/uL (ref 0.0–0.4)
Eos: 4 %
Hematocrit: 39.8 % (ref 37.5–51.0)
Hemoglobin: 13.4 g/dL (ref 13.0–17.7)
Immature Grans (Abs): 0 10*3/uL (ref 0.0–0.1)
Immature Granulocytes: 0 %
Lymphocytes Absolute: 2 10*3/uL (ref 0.7–3.1)
Lymphs: 49 %
MCH: 33.3 pg — ABNORMAL HIGH (ref 26.6–33.0)
MCHC: 33.7 g/dL (ref 31.5–35.7)
MCV: 99 fL — ABNORMAL HIGH (ref 79–97)
Monocytes Absolute: 0.2 10*3/uL (ref 0.1–0.9)
Monocytes: 6 %
Neutrophils Absolute: 1.6 10*3/uL (ref 1.4–7.0)
Neutrophils: 40 %
Platelets: 205 10*3/uL (ref 150–450)
RBC: 4.03 x10E6/uL — ABNORMAL LOW (ref 4.14–5.80)
RDW: 12.4 % (ref 11.6–15.4)
WBC: 4 10*3/uL (ref 3.4–10.8)

## 2021-04-09 LAB — HCV AB W REFLEX TO QUANT PCR: HCV Ab: <0.1 s/co ratio (ref 0.0–0.9)

## 2021-04-09 LAB — HIV ANTIBODY (ROUTINE TESTING W REFLEX): HIV Screen 4th Generation wRfx: NONREACTIVE

## 2021-04-09 LAB — HCV INTERPRETATION

## 2021-04-09 LAB — VARICELLA ZOSTER ANTIBODY, IGG: Varicella zoster IgG: 883 index (ref >165)

## 2021-04-09 LAB — HEPATITIS B SURFACE ANTIGEN: Hepatitis B Surface Ag: NEGATIVE

## 2021-04-09 LAB — RPR: RPR Ser Ql: NONREACTIVE

## 2021-04-09 LAB — STRONGYLOIDES, AB, IGG: Strongyloides, Ab, IgG: NEGATIVE

## 2021-04-09 LAB — TSH: TSH: 0.793 u[IU]/mL (ref 0.450–4.500)

## 2021-04-09 NOTE — Progress Notes (Signed)
LABRANDON, KNOCH ([id number]) Visit Report for 04/07/2021 Arrival Information Details Patient Name: Date of Service: [name] [place] NIE 04/07/2021 10:45 A M Medical Record Number: [id number] Patient Account Number: [id number] Date of Birth/Sex: Treating RN: 24-Jul-1976 (45 y.o. [name] Primary Care Savhanna Sliva: PCP, NO Other Clinician: Referring Caylan Chenard: Treating Breckan Cafiero/Extender: Simon Rhein in Treatment: 8 Visit Information History Since Last Visit Added or deleted any medications: No Patient Arrived: Ambulatory Any new allergies or adverse reactions: No Arrival Time: 11:02 Had a fall or experienced change in No Accompanied By: interpretor activities of daily living that may affect Transfer Assistance: None risk of falls: Patient Identification Verified: Yes Signs or symptoms of abuse/neglect since last visito No Secondary Verification Process Completed: Yes Hospitalized since last visit: No Patient Requires Transmission-Based Precautions: No Implantable device outside of the clinic excluding No Patient Has Alerts: No cellular tissue based products placed in the center since last visit: Has Dressing in Place as Prescribed: Yes Has Compression in Place as Prescribed: Yes Pain Present Now: No Electronic Signature(s) Signed: 04/07/2021 5:42:35 PM By: Zandra Abts RN, BSN Entered By: Zandra Abts on 04/07/2021 11:15:11 -------------------------------------------------------------------------------- Compression Therapy Details Patient Name: Date of Service: [name] 04/07/2021 10:45 A M Medical Record Number: [id number] Patient Account Number: [id number] Date of Birth/Sex: Treating RN: [birth year] (45 y.o. [name] Primary Care Hopelynn Gartland: PCP, NO Other Clinician: Referring Joanna Hall: Treating Moroni Nester/Extender: Simon Rhein in Treatment: 8 Compression Therapy Performed for Wound Assessment: Wound #1 Left  Ankle Performed By: Clinician Zandra Abts, RN Compression Type: Three Layer Post Procedure Diagnosis Same as Pre-procedure Electronic Signature(s) Signed: 04/07/2021 5:42:35 PM By: Zandra Abts RN, BSN Entered By: Zandra Abts on 04/07/2021 11:19:52 -------------------------------------------------------------------------------- Encounter Discharge Information Details Patient Name: Date of Service: [name] 04/07/2021 10:45 A M Medical Record Number: [id number] Patient Account Number: [id number] Date of Birth/Sex: Treating RN: 12/10/1975 (45 y.o. [name] Primary Care Rynell Ciotti: PCP, NO Other Clinician: Referring Selmer Adduci: Treating Lanasia Porras/Extender: Simon Rhein in Treatment: 8 Encounter Discharge Information Items Discharge Condition: Stable Ambulatory Status: Crutches Discharge Destination: Home Transportation: Private Auto Accompanied By: interpretor Schedule Follow-up Appointment: Yes Clinical Summary of Care: Patient Declined Electronic Signature(s) Signed: 04/07/2021 5:42:35 PM By: Zandra Abts RN, BSN Entered By: Zandra Abts on 04/07/2021 11:57:49 -------------------------------------------------------------------------------- Lower Extremity Assessment Details Patient Name: Date of Service: [name] 04/07/2021 10:45 A M Medical Record Number: [id number] Patient Account Number: [id number] Date of Birth/Sex: Treating RN: March 16, 1976 (45 y.o. [name] Primary Care Berlyn Malina: PCP, NO Other Clinician: Referring Juanette Urizar: Treating Jahan Friedlander/Extender: Simon Rhein in Treatment: 8 Edema Assessment Assessed: [name]: No] [name]: No] Edema: [Left: N] [Right: o] Calf Left: Right: Point of Measurement: 28 cm From Medial Instep 31 cm Ankle Left: Right: Point of Measurement: 8 cm From Medial Instep 22 cm Vascular Assessment Pulses: Dorsalis Pedis Palpable: [Left:Yes] Electronic  Signature(s) Signed: 04/07/2021 5:42:35 PM By: Zandra Abts RN, BSN Entered By: Zandra Abts on 04/07/2021 11:15:38 -------------------------------------------------------------------------------- Multi Wound Chart Details Patient Name: Date of Service: [name] 04/07/2021 10:45 A M Medical Record Number: [id number] Patient Account Number: [id number] Date of Birth/Sex: Treating RN: 01-13-1976 (45 y.o. [name] Primary Care Devanie Galanti: PCP, NO Other Clinician: Referring Alethia Melendrez: Treating Kimia Finan/Extender: Simon Rhein in Treatment: 8 Vital Signs Height(in): Pulse(bpm): 66 Weight(lbs): Blood Pressure(mmHg): 95/63 Body Mass Index(BMI): Temperature(F): 98.2 Respiratory Rate(breaths/min): 18 Photos: [N/A:N/A]  Left Ankle N/A N/A Wound Location: Trauma N/A N/A Wounding Event: Trauma, Other N/A N/A Primary Etiology: 10/14/2020 N/A N/A Date Acquired: 8 N/A N/A Weeks of Treatment: Open N/A N/A Wound Status: 3.5x8.5x0.2 N/A N/A Measurements L x W x D (cm) 23.366 N/A N/A A (cm) : rea 4.673 N/A N/A Volume (cm) : 62.10% N/A N/A % Reduction in A rea: 81.00% N/A N/A % Reduction in Volume: Full Thickness Without Exposed N/A N/A Classification: Support Structures Medium N/A N/A Exudate Amount: Serosanguineous N/A N/A Exudate Type: red, brown N/A N/A Exudate Color: Distinct, outline attached N/A N/A Wound Margin: Medium (34-66%) N/A N/A Granulation Amount: Red, Pink N/A N/A Granulation Quality: Medium (34-66%) N/A N/A Necrotic Amount: Fat Layer (Subcutaneous Tissue): Yes N/A N/A Exposed Structures: Fascia: No Tendon: No Muscle: No Joint: No Bone: No Medium (34-66%) N/A N/A Epithelialization: Compression Therapy N/A N/A Procedures Performed: Treatment Notes Electronic Signature(s) Signed: 04/07/2021 5:13:37 PM By: Baltazar Najjar MD Signed: 04/07/2021 5:22:11 PM By: Fonnie Mu RN Entered By: Baltazar Najjar on 04/07/2021 11:28:49 -------------------------------------------------------------------------------- Multi-Disciplinary Care Plan Details Patient Name: Date of Service: [name] 04/07/2021 10:45 A M Medical Record Number: [id number] Patient Account Number: [id number] Date of Birth/Sex: Treating RN: Dec 04, 1975 (45 y.o. [name] Primary Care Shawan Tosh: PCP, NO Other Clinician: Referring Spruha Weight: Treating Ziaire Hagos/Extender: Simon Rhein in Treatment: 8 Active Inactive Wound/Skin Impairment Nursing Diagnoses: Knowledge deficit related to ulceration/compromised skin integrity Goals: Patient/caregiver will verbalize understanding of skin care regimen Date Initiated: 02/05/2021 Target Resolution Date: 04/28/2021 Goal Status: Active Interventions: Assess patient/caregiver ability to obtain necessary supplies Assess patient/caregiver ability to perform ulcer/skin care regimen upon admission and as needed Provide education on ulcer and skin care Treatment Activities: Skin care regimen initiated : 02/05/2021 Topical wound management initiated : 02/05/2021 Notes: 03/31/21: Wound care regimen ongoing, target date extended. Electronic Signature(s) Signed: 04/07/2021 5:42:35 PM By: Zandra Abts RN, BSN Entered By: Zandra Abts on 04/07/2021 11:20:55 -------------------------------------------------------------------------------- Pain Assessment Details Patient Name: Date of Service: [name] 04/07/2021 10:45 A M Medical Record Number: [id number] Patient Account Number: [id number] Date of Birth/Sex: Treating RN: 08/14/1975 (45 y.o. [name] Primary Care Khai Torbert: PCP, NO Other Clinician: Referring Bauer Ausborn: Treating Vani Gunner/Extender: Simon Rhein in Treatment: 8 Active Problems Location of Pain Severity and Description of Pain Patient Has Paino No Site Locations Pain Management and  Medication Current Pain Management: Electronic Signature(s) Signed: 04/07/2021 5:42:35 PM By: Zandra Abts RN, BSN Entered By: Zandra Abts on 04/07/2021 11:15:29 -------------------------------------------------------------------------------- Patient/Caregiver Education Details Patient Name: Date of Service: [name] 9/6/2022andnbsp10:45 A M Medical Record Number: [id number] Patient Account Number: [id number] Date of Birth/Gender: Treating RN: [birth year] (45 y.o. [name] Primary Care Physician: PCP, NO Other Clinician: Referring Physician: Treating Physician/Extender: Simon Rhein in Treatment: 8 Education Assessment Education Provided To: Patient Education Topics Provided Wound/Skin Impairment: Methods: Explain/Verbal Responses: State content correctly Electronic Signature(s) Signed: 04/07/2021 5:42:35 PM By: Zandra Abts RN, BSN Entered By: Zandra Abts on 04/07/2021 11:21:29 -------------------------------------------------------------------------------- Wound Assessment Details Patient Name: Date of Service: [name] 04/07/2021 10:45 A M Medical Record Number: [id number] Patient Account Number: [id number] Date of Birth/Sex: Treating RN: [birth year] (45 y.o. [name] Primary Care Wilkie Zenon: PCP, NO Other Clinician: Referring Lacorey Brusca: Treating Lucette Kratz/Extender: Simon Rhein in Treatment: 8 Wound Status Wound Number: 1 Primary Etiology: Trauma, Other Wound Location: Left Ankle Wound Status: Open Wounding Event: Trauma  Date Acquired: 10/14/2020 Weeks Of Treatment: 8 Clustered Wound: No Photos Wound Measurements Length: (cm) 3.5 Width: (cm) 8.5 Depth: (cm) 0.2 Area: (cm) 23.366 Volume: (cm) 4.673 % Reduction in Area: 62.1% % Reduction in Volume: 81% Epithelialization: Medium (34-66%) Tunneling: No Undermining: No Wound Description Classification: Full Thickness  Without Exposed Support Structures Wound Margin: Distinct, outline attached Exudate Amount: Medium Exudate Type: Serosanguineous Exudate Color: red, brown Foul Odor After Cleansing: No Slough/Fibrino Yes Wound Bed Granulation Amount: Medium (34-66%) Exposed Structure Granulation Quality: Red, Pink Fascia Exposed: No Necrotic Amount: Medium (34-66%) Fat Layer (Subcutaneous Tissue) Exposed: Yes Necrotic Quality: Adherent Slough Tendon Exposed: No Muscle Exposed: No Joint Exposed: No Bone Exposed: No Treatment Notes Wound #1 (Ankle) Wound Laterality: Left Cleanser Soap and Water Discharge Instruction: May shower and wash wound with dial antibacterial soap and water prior to dressing change. Wound Cleanser Discharge Instruction: Cleanse the wound with wound cleanser prior to applying a clean dressing using gauze sponges, not tissue or cotton balls. Peri-Wound Care Triamcinolone 15 (g) Discharge Instruction: Use triamcinolone 15 (g) as directed Sween Lotion (Moisturizing lotion) Discharge Instruction: Apply moisturizing lotion as directed Topical Primary Dressing Hydrofera Blue Classic Foam, 2x2 in Discharge Instruction: Moisten with saline prior to applying to wound bed Secondary Dressing Zetuvit Plus 4x8 in Discharge Instruction: Apply over primary dressing as directed. CarboFLEX Odor Control Dressing, 4x4 in Discharge Instruction: Apply over primary dressing as directed. Secured With Compression Wrap ThreePress (3 layer compression wrap) Discharge Instruction: Apply three layer compression as directed. Compression Stockings Add-Ons Electronic Signature(s) Signed: 04/07/2021 5:22:11 PM By: Fonnie Mu RN Signed: 04/09/2021 11:10:38 AM By: Karl Ito Entered By: Karl Ito on 04/07/2021 11:14:18 -------------------------------------------------------------------------------- Vitals Details Patient Name: Date of Service: [name], [name] 04/07/2021  10:45 A M Medical Record Number: [id number] Patient Account Number: [id number] Date of Birth/Sex: Treating RN: 1976/05/07 (45 y.o. [name] Primary Care Jshawn Hurta: PCP, NO Other Clinician: Referring Nilton Lave: Treating Bevin Das/Extender: Simon Rhein in Treatment: 8 Vital Signs Time Taken: 11:02 Temperature (F): 98.2 Pulse (bpm): 66 Respiratory Rate (breaths/min): 18 Blood Pressure (mmHg): 95/63 Reference Range: 80 - 120 mg / dl Electronic Signature(s) Signed: 04/07/2021 5:42:35 PM By: Zandra Abts RN, BSN Entered By: Zandra Abts on 04/07/2021 11:15:24

## 2021-04-14 ENCOUNTER — Other Ambulatory Visit: Payer: Self-pay

## 2021-04-14 ENCOUNTER — Telehealth: Payer: Self-pay | Admitting: Family Medicine

## 2021-04-14 ENCOUNTER — Encounter (HOSPITAL_BASED_OUTPATIENT_CLINIC_OR_DEPARTMENT_OTHER): Payer: Medicaid Other | Admitting: Internal Medicine

## 2021-04-14 DIAGNOSIS — I87332 Chronic venous hypertension (idiopathic) with ulcer and inflammation of left lower extremity: Secondary | ICD-10-CM | POA: Diagnosis not present

## 2021-04-14 DIAGNOSIS — L97328 Non-pressure chronic ulcer of left ankle with other specified severity: Secondary | ICD-10-CM | POA: Diagnosis not present

## 2021-04-14 DIAGNOSIS — R319 Hematuria, unspecified: Secondary | ICD-10-CM

## 2021-04-14 DIAGNOSIS — L97322 Non-pressure chronic ulcer of left ankle with fat layer exposed: Secondary | ICD-10-CM | POA: Diagnosis not present

## 2021-04-14 NOTE — Telephone Encounter (Signed)
Called patient with [place] interpreter with his results. All questions answered. Reviewed time and date of upcoming visit.   Hematuria- will need obtain urine GC/CT at follow up. Consider If this is negative, obtain Schistosoma IgG. Given persistent hematuria, if GC/CT negative, refer to Urology.   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 Let me know if questions.  Terisa Starr, MD  Family Medicine Teaching Service

## 2021-04-14 NOTE — Assessment & Plan Note (Signed)
Needs GC/CT Consider schistosoma  Refer to Urology

## 2021-04-15 NOTE — Progress Notes (Signed)
BARLOW, HARRISON ([id number]) Visit Report for 04/14/2021 Arrival Information Details Patient Name: Date of Service: [name] 04/14/2021 1:30 PM Medical Record Number: [id number] Patient Account Number: [id number] Date of Birth/Sex: Treating RN: Jul 18, 1976 (45 y.o. [name], [name] Primary Care Devra Stare: Lovenia Shuck, A NTO N Other Clinician: Referring Darin Redmann: Treating Amilyah Nack/Extender: Simon Rhein in Treatment: 9 Visit Information History Since Last Visit Added or deleted any medications: No Patient Arrived: Crutches Any new allergies or adverse reactions: No Arrival Time: 13:30 Had a fall or experienced change in No Accompanied By: interpreter activities of daily living that may affect Transfer Assistance: None risk of falls: Patient Identification Verified: Yes Signs or symptoms of abuse/neglect since last visito No Secondary Verification Process Completed: Yes Hospitalized since last visit: No Patient Requires Transmission-Based Precautions: No Implantable device outside of the clinic excluding No Patient Has Alerts: No cellular tissue based products placed in the center since last visit: Has Dressing in Place as Prescribed: Yes Has Compression in Place as Prescribed: Yes Pain Present Now: No Electronic Signature(s) Signed: 04/14/2021 5:15:03 PM By: Fonnie Mu RN Entered By: Fonnie Mu on 04/14/2021 13:30:53 -------------------------------------------------------------------------------- Compression Therapy Details Patient Name: Date of Service: [name] 04/14/2021 1:30 PM Medical Record Number: [id number] Patient Account Number: [id number] Date of Birth/Sex: Treating RN: April 21, 1976 (45 y.o. [name] Primary Care Melford Tullier: Lovenia Shuck, Glenis Smoker Other Clinician: Referring Elija Mccamish: Treating Kaiyan Luczak/Extender: Simon Rhein in Treatment: 9 Compression Therapy Performed for Wound  Assessment: Wound #1 Left Ankle Performed By: Clinician Fonnie Mu, RN Compression Type: Three Layer Post Procedure Diagnosis Same as Pre-procedure Electronic Signature(s) Signed: 04/14/2021 5:15:03 PM By: Fonnie Mu RN Entered By: Fonnie Mu on 04/14/2021 14:27:31 -------------------------------------------------------------------------------- Encounter Discharge Information Details Patient Name: Date of Service: [name], [name] 04/14/2021 1:30 PM Medical Record Number: [id number] Patient Account Number: [id number] Date of Birth/Sex: Treating RN: 1976-05-10 (45 y.o. [name], [name] Primary Care Maki Hege: Lovenia Shuck, A NTO N Other Clinician: Referring Tanicia Wolaver: Treating Cadel Stairs/Extender: Simon Rhein in Treatment: 9 Encounter Discharge Information Items Post Procedure Vitals Discharge Condition: Stable Temperature (F): 97.4 Ambulatory Status: Ambulatory Pulse (bpm): 74 Discharge Destination: Home Respiratory Rate (breaths/min): 17 Transportation: Private Auto Blood Pressure (mmHg): 109/74 Accompanied By: interpreter Schedule Follow-up Appointment: Yes Clinical Summary of Care: Patient Declined Electronic Signature(s) Signed: 04/14/2021 5:15:03 PM By: Fonnie Mu RN Entered By: Fonnie Mu on 04/14/2021 14:28:17 -------------------------------------------------------------------------------- Lower Extremity Assessment Details Patient Name: Date of Service: [name] 04/14/2021 1:30 PM Medical Record Number: [id number] Patient Account Number: [id number] Date of Birth/Sex: Treating RN: Jun 22, 1976 (45 y.o. [name], [name] Primary Care Henritta Mutz: Lovenia Shuck, Glenis Smoker Other Clinician: Referring Gildo Crisco: Treating Yarima Penman/Extender: Simon Rhein in Treatment: 9 Edema Assessment Assessed: [name]: Yes] [Right: No] Edema: [Left: N] [Right: o] Calf Left: Right: Point of Measurement:  28 cm From Medial Instep 31 cm Ankle Left: Right: Point of Measurement: 8 cm From Medial Instep 22 cm Vascular Assessment Pulses: Dorsalis Pedis Palpable: [Left:Yes] Posterior Tibial Palpable: [Left:Yes] Electronic Signature(s) Signed: 04/14/2021 5:15:03 PM By: Fonnie Mu RN Entered By: Fonnie Mu on 04/14/2021 13:35:08 -------------------------------------------------------------------------------- Multi Wound Chart Details Patient Name: Date of Service: [name], [name] 04/14/2021 1:30 PM Medical Record Number: [id number] Patient Account Number: [id number] Date of Birth/Sex: Treating RN: 1976-01-11 (45 y.o. [name], [name] Primary Care Shellsea Borunda: Lovenia Shuck, A NTO N Other Clinician: Referring Tiffany Talarico: Treating Darragh Nay/Extender:  Simon Rhein in Treatment: 9 Vital Signs Height(in): Pulse(bpm): 74 Weight(lbs): Blood Pressure(mmHg): 109/64 Body Mass Index(BMI): Temperature(F): 97.8 Respiratory Rate(breaths/min): 17 Photos: [N/A:N/A] Left Ankle N/A N/A Wound Location: Trauma N/A N/A Wounding Event: Trauma, Other N/A N/A Primary Etiology: 10/14/2020 N/A N/A Date Acquired: 9 N/A N/A Weeks of Treatment: Open N/A N/A Wound Status: 3.8x8.3x0.2 N/A N/A Measurements L x W x D (cm) 24.771 N/A N/A A (cm) : rea 4.954 N/A N/A Volume (cm) : 59.80% N/A N/A % Reduction in A rea: 79.90% N/A N/A % Reduction in Volume: Full Thickness Without Exposed N/A N/A Classification: Support Structures Medium N/A N/A Exudate A mount: Serosanguineous N/A N/A Exudate Type: red, brown N/A N/A Exudate Color: Distinct, outline attached N/A N/A Wound Margin: Medium (34-66%) N/A N/A Granulation A mount: Red, Pink N/A N/A Granulation Quality: Medium (34-66%) N/A N/A Necrotic A mount: Fat Layer (Subcutaneous Tissue): Yes N/A N/A Exposed Structures: Fascia: No Tendon: No Muscle: No Joint: No Bone: No Medium (34-66%) N/A  N/A Epithelialization: Debridement - Excisional N/A N/A Debridement: Pre-procedure Verification/Time Out 14:02 N/A N/A Taken: Lidocaine N/A N/A Pain Control: Subcutaneous, Slough N/A N/A Tissue Debrided: Skin/Subcutaneous Tissue N/A N/A Level: 31.54 N/A N/A Debridement A (sq cm): rea Curette N/A N/A Instrument: Minimum N/A N/A Bleeding: Pressure N/A N/A Hemostasis A chieved: 0 N/A N/A Procedural Pain: 0 N/A N/A Post Procedural Pain: Procedure was tolerated well N/A N/A Debridement Treatment Response: 3.8x8.3x0.2 N/A N/A Post Debridement Measurements L x W x D (cm) 4.954 N/A N/A Post Debridement Volume: (cm) Debridement N/A N/A Procedures Performed: Treatment Notes Electronic Signature(s) Signed: 04/14/2021 5:15:03 PM By: Fonnie Mu RN Signed: 04/15/2021 3:40:47 PM By: Baltazar Najjar MD Entered By: Baltazar Najjar on 04/14/2021 14:14:10 -------------------------------------------------------------------------------- Multi-Disciplinary Care Plan Details Patient Name: Date of Service: [name], [name] 04/14/2021 1:30 PM Medical Record Number: [id number] Patient Account Number: [id number] Date of Birth/Sex: Treating RN: 14-Jun-1976 (45 y.o. [name], [name] Primary Care Wanita Derenzo: Lovenia Shuck, A NTO N Other Clinician: Referring Katiya Fike: Treating Giana Castner/Extender: Simon Rhein in Treatment: 9 Active Inactive Wound/Skin Impairment Nursing Diagnoses: Knowledge deficit related to ulceration/compromised skin integrity Goals: Patient/caregiver will verbalize understanding of skin care regimen Date Initiated: 02/05/2021 Target Resolution Date: 04/23/2021 Goal Status: Active Interventions: Assess patient/caregiver ability to obtain necessary supplies Assess patient/caregiver ability to perform ulcer/skin care regimen upon admission and as needed Provide education on ulcer and skin care Treatment Activities: Skin care regimen  initiated : 02/05/2021 Topical wound management initiated : 02/05/2021 Notes: 03/31/21: Wound care regimen ongoing, target date extended. Electronic Signature(s) Signed: 04/14/2021 5:15:03 PM By: Fonnie Mu RN Entered By: Fonnie Mu on 04/14/2021 14:14:37 -------------------------------------------------------------------------------- Pain Assessment Details Patient Name: Date of Service: [name] 04/14/2021 1:30 PM Medical Record Number: [id number] Patient Account Number: [id number] Date of Birth/Sex: Treating RN: 08-22-1975 (45 y.o. [name], [name] Primary Care Samatha Anspach: Lovenia Shuck, Glenis Smoker Other Clinician: Referring Annalicia Renfrew: Treating Marra Fraga/Extender: Simon Rhein in Treatment: 9 Active Problems Location of Pain Severity and Description of Pain Patient Has Paino No Site Locations Pain Management and Medication Current Pain Management: Electronic Signature(s) Signed: 04/14/2021 5:15:03 PM By: Fonnie Mu RN Entered By: Fonnie Mu on 04/14/2021 13:34:59 -------------------------------------------------------------------------------- Patient/Caregiver Education Details Patient Name: Date of Service: [name] 9/13/2022andnbsp1:30 PM Medical Record Number: [id number] Patient Account Number: [id number] Date of Birth/Gender: Treating RN: [birth year] (45 y.o. [name] Primary Care Physician: Lovenia Shuck, A NTO N Other  Clinician: Referring Physician: Treating Physician/Extender: Simon Rhein in Treatment: 9 Education Assessment Education Provided To: Patient Education Topics Provided Basic Hygiene: Methods: Explain/Verbal Responses: State content correctly Welcome T [facility]: o Methods: Explain/Verbal Responses: State content correctly Wound/Skin Impairment: Methods: Explain/Verbal Responses: State content correctly Electronic Signature(s) Signed:  04/14/2021 5:15:03 PM By: Fonnie Mu RN Entered By: Fonnie Mu on 04/14/2021 14:27:02 -------------------------------------------------------------------------------- Wound Assessment Details Patient Name: Date of Service: [name] 04/14/2021 1:30 PM Medical Record Number: [id number] Patient Account Number: [id number] Date of Birth/Sex: Treating RN: 1976-02-28 (45 y.o. [name], [name] Primary Care Clarrissa Shimkus: Lovenia Shuck, A NTO N Other Clinician: Referring Quinlynn Cuthbert: Treating Lennix Kneisel/Extender: Simon Rhein in Treatment: 9 Wound Status Wound Number: 1 Primary Etiology: Trauma, Other Wound Location: Left Ankle Wound Status: Open Wounding Event: Trauma Date Acquired: 10/14/2020 Weeks Of Treatment: 9 Clustered Wound: No Photos Wound Measurements Length: (cm) 3.8 Width: (cm) 8.3 Depth: (cm) 0.2 Area: (cm) 24.771 Volume: (cm) 4.954 % Reduction in Area: 59.8% % Reduction in Volume: 79.9% Epithelialization: Medium (34-66%) Tunneling: No Undermining: No Wound Description Classification: Full Thickness Without Exposed Support Structu Wound Margin: Distinct, outline attached Exudate Amount: Medium Exudate Type: Serosanguineous Exudate Color: red, brown res Foul Odor After Cleansing: No Slough/Fibrino Yes Wound Bed Granulation Amount: Medium (34-66%) Exposed Structure Granulation Quality: Red, Pink Fascia Exposed: No Necrotic Amount: Medium (34-66%) Fat Layer (Subcutaneous Tissue) Exposed: Yes Necrotic Quality: Adherent Slough Tendon Exposed: No Muscle Exposed: No Joint Exposed: No Bone Exposed: No Treatment Notes Wound #1 (Ankle) Wound Laterality: Left Cleanser Soap and Water Discharge Instruction: May shower and wash wound with dial antibacterial soap and water prior to dressing change. Wound Cleanser Discharge Instruction: Cleanse the wound with wound cleanser prior to applying a clean dressing using gauze sponges, not  tissue or cotton balls. Peri-Wound Care Triamcinolone 15 (g) Discharge Instruction: Use triamcinolone 15 (g) as directed Sween Lotion (Moisturizing lotion) Discharge Instruction: Apply moisturizing lotion as directed Topical Primary Dressing IODOFLEX 0.9% Cadexomer Iodine Pad 4x6 cm Discharge Instruction: Apply to wound bed as instructed Secondary Dressing Zetuvit Plus 4x8 in Discharge Instruction: Apply over primary dressing as directed. CarboFLEX Odor Control Dressing, 4x4 in Discharge Instruction: Apply over primary dressing as directed. Secured With Compression Wrap ThreePress (3 layer compression wrap) Discharge Instruction: Apply three layer compression as directed. Compression Stockings Add-Ons Electronic Signature(s) Signed: 04/14/2021 4:24:32 PM By: Shearon Stalls Signed: 04/14/2021 5:15:03 PM By: Fonnie Mu RN Previous Signature: 04/14/2021 4:23:39 PM Version By: Shearon Stalls Entered By: Shearon Stalls on 04/14/2021 16:24:31 -------------------------------------------------------------------------------- Vitals Details Patient Name: Date of Service: [name], [name] 04/14/2021 1:30 PM Medical Record Number: [id number] Patient Account Number: [id number] Date of Birth/Sex: Treating RN: [birth year] (45 y.o. [name] Primary Care Thoren Hosang: Lovenia Shuck, A NTO N Other Clinician: Referring Diksha Tagliaferro: Treating Hiran Leard/Extender: Simon Rhein in Treatment: 9 Vital Signs Time Taken: 13:34 Temperature (F): 97.8 Pulse (bpm): 74 Respiratory Rate (breaths/min): 17 Blood Pressure (mmHg): 109/64 Reference Range: 80 - 120 mg / dl Electronic Signature(s) Signed: 04/14/2021 5:15:03 PM By: Fonnie Mu RN Entered By: Fonnie Mu on 04/14/2021 13:34:50

## 2021-04-15 NOTE — Progress Notes (Signed)
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 MolecuLight DX: 1st Scanned Wound The following wound was scanned with MolecuLight DX): left ankle Wound previously healing as expected but has recently stalled (flattening of Fluorescence bacterial imaging was medically necessary today due to the area/volume), Initial Evaluation of the wound with MolecuLightDX to (Indication): determine baseline bacterial bioburden level MolecuLight Results Red Colors The indicated colors were noted in the following area(s). In the periphery of the wound As a result of todays scan, the following treatment plans were put in place. debridement in documented area MolecuLight Procedure The MolecularLight DX device was cleaned with a disinfectant wipe prior to use., The correct patient profile was confirmed and correct wound was verified., Range finder sensor used to ensure appropriate distance selected The following was completed: between imaging unit and wound bed, Room lights were turned off and the ambient light sensor was checked., Blue circle appeared around the lightbulb., The  fluorescence icon was selected. Screen was tapped to enhance focus and the image was captured. Potential ICD-10 Codes ICD-10 49.9 Bacterial infection unspecified (Red Color) Additional Scanned Wounds Did you scan any additional Woundso No Electronic Signature(s) Signed: 04/15/2021 3:40:47 PM By: Baltazar Najjar MD Entered By: Baltazar Najjar on 04/14/2021 14:23:03 -------------------------------------------------------------------------------- SuperBill Details Patient Name: Date of Service: [name] 04/14/2021 Medical Record Number: [id number] Patient Account Number: [id number] Date of Birth/Sex: Treating RN: 07-29-1976 (45 y.o. [name], [name] Primary Care Provider: Lovenia Shuck, A NTO N Other Clinician: Referring Provider: Treating Provider/Extender: Simon Rhein in Treatment: 9 Diagnosis Coding ICD-10 Codes Code Description [phone] Non-pressure chronic ulcer of left ankle with other specified severity I87.332 Chronic venous hypertension (idiopathic) with ulcer and inflammation of left lower extremity Facility Procedures CPT4 Code: [id number] Description: 11042 - DEB SUBQ TISSUE 20 SQ CM/< ICD-10 Diagnosis Description L97.328 Non-pressure chronic ulcer of left ankle with other specified severity Modifier: Quantity: 1 CPT4 Code: [id number] Description: 11045 - DEB SUBQ TISS EA ADDL 20CM ICD-10 Diagnosis Description L97.328 Non-pressure chronic ulcer of left ankle with other specified severity Modifier: Quantity: 1 Physician Procedures : CPT4 Code Description Modifier [id number] 11042 - WC PHYS SUBQ TISS 20 SQ CM ICD-10 Diagnosis Description L97.328 Non-pressure chronic ulcer of left ankle with other specified severity Quantity: 1 : [id number] 11045 - WC PHYS SUBQ TISS EA ADDL 20 CM ICD-10 Diagnosis Description L97.328 Non-pressure chronic ulcer of left ankle with other specified severity Quantity: 1 Electronic Signature(s) Signed: 04/14/2021 5:15:03  PM By: Fonnie Mu RN Signed:  04/15/2021 3:40:47 PM By: Baltazar Najjar MD Entered By: Fonnie Mu on 04/14/2021 14:27:17

## 2021-04-20 ENCOUNTER — Other Ambulatory Visit: Payer: Self-pay

## 2021-04-20 ENCOUNTER — Ambulatory Visit (INDEPENDENT_AMBULATORY_CARE_PROVIDER_SITE_OTHER): Payer: Medicaid Other | Admitting: Internal Medicine

## 2021-04-20 ENCOUNTER — Encounter: Payer: Self-pay | Admitting: Internal Medicine

## 2021-04-20 VITALS — BP 109/71 | HR 69 | Temp 98.2°F | Wt 151.0 lb

## 2021-04-20 DIAGNOSIS — L98492 Non-pressure chronic ulcer of skin of other sites with fat layer exposed: Secondary | ICD-10-CM | POA: Diagnosis not present

## 2021-04-20 DIAGNOSIS — Z5181 Encounter for therapeutic drug level monitoring: Secondary | ICD-10-CM

## 2021-04-20 NOTE — Progress Notes (Signed)
   Subjective:    Patient ID: [name], male    DOB: 04-Aug-1975, 45 y.o.   MRN: [id number]  HPI Here for follow up of a possible lower extremity infection of unknown etiology He continues to follow with Dr. Leanord Hawking of wound care and notes reviewed, now with improvement in the size of the area.  He reports continued drainage but not c/w pus.  He has continued on clarithromycin and Bactrim.  No issues and recent creat wnl.     Review of Systems  Constitutional:  Negative for chills and fever.  Gastrointestinal:  Negative for diarrhea.  Skin:  Positive for wound. Negative for rash.      Objective:   Physical Exam Eyes:     General: No scleral icterus. Musculoskeletal:     Comments: Wrapped leg  Neurological:     Mental Status: He is alert.  Psychiatric:        Mood and Affect: Mood normal.          Assessment & Plan:

## 2021-04-21 ENCOUNTER — Encounter (HOSPITAL_BASED_OUTPATIENT_CLINIC_OR_DEPARTMENT_OTHER): Payer: Medicaid Other | Admitting: Internal Medicine

## 2021-04-21 ENCOUNTER — Other Ambulatory Visit: Payer: Self-pay

## 2021-04-21 DIAGNOSIS — I87332 Chronic venous hypertension (idiopathic) with ulcer and inflammation of left lower extremity: Secondary | ICD-10-CM | POA: Diagnosis not present

## 2021-04-21 DIAGNOSIS — L97328 Non-pressure chronic ulcer of left ankle with other specified severity: Secondary | ICD-10-CM | POA: Diagnosis not present

## 2021-04-21 DIAGNOSIS — L97322 Non-pressure chronic ulcer of left ankle with fat layer exposed: Secondary | ICD-10-CM | POA: Diagnosis not present

## 2021-04-21 DIAGNOSIS — A310 Pulmonary mycobacterial infection: Secondary | ICD-10-CM

## 2021-04-21 LAB — BASIC METABOLIC PANEL
BUN: 12 mg/dL (ref 7–25)
CO2: 20 mmol/L (ref 20–32)
Calcium: 8.8 mg/dL (ref 8.6–10.3)
Chloride: 114 mmol/L — ABNORMAL HIGH (ref 98–110)
Creat: 1.21 mg/dL (ref 0.60–1.29)
Glucose, Bld: 86 mg/dL (ref 65–99)
Potassium: 4.1 mmol/L (ref 3.5–5.3)
Sodium: 140 mmol/L (ref 135–146)

## 2021-04-21 MED ORDER — CLARITHROMYCIN 500 MG PO TABS: 500.0000 mg | ORAL_TABLET | Freq: Two times a day (BID) | ORAL | 1 refills | Status: DC

## 2021-04-21 MED ORDER — SULFAMETHOXAZOLE-TRIMETHOPRIM 800-160 MG PO TABS: 1.0000 | ORAL_TABLET | Freq: Two times a day (BID) | ORAL | 1 refills | Status: DC

## 2021-04-21 NOTE — Progress Notes (Signed)
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 Signature(s) Signed: 04/21/2021 4:30:53 PM By: Baltazar Najjar MD Entered By: Baltazar Najjar on 04/21/2021 09:30:18

## 2021-04-21 NOTE — Progress Notes (Signed)
[name], [name] ([id number]) Visit Report for 04/21/2021 Arrival Information Details Patient Name: Date of Service: [name] [place] NIE 04/21/2021 8:15 A M Medical Record Number: [id number] Patient Account Number: [id number] Date of Birth/Sex: Treating RN: [birth year] (45 y.o. [name], [name] Primary Care [name]: [name], A NTO N Other Clinician: Referring [name]: Treating [name]/Extender: [name], A NTO N Weeks in Treatment: 10 Visit Information History Since Last Visit Added or deleted any medications: No Patient Arrived: Crutches Any new allergies or adverse reactions: No Arrival Time: 08:22 Had a fall or experienced change in No Accompanied By: case manager activities of daily living that may affect Transfer Assistance: None risk of falls: Patient Identification Verified: Yes Signs or symptoms of abuse/neglect since last visito No Secondary Verification Process Completed: Yes Hospitalized since last visit: No Patient Requires Transmission-Based Precautions: No Implantable device outside of the clinic excluding No Patient Has Alerts: No cellular tissue based products placed in the center since last visit: Has Dressing in Place as Prescribed: Yes Pain Present Now: No Electronic Signature(s) Signed: 04/21/2021 4:02:45 PM By: [name] Entered By: [name] on 04/21/2021 08:23:00 -------------------------------------------------------------------------------- Compression Therapy Details Patient Name: Date of Service: [name] 04/21/2021 8:15 A M Medical Record Number: [id number] Patient Account Number: [id number] Date of Birth/Sex: Treating RN: 11-24-1975 (45 y.o. [name] Primary Care [name]: [name], A NTO N Other Clinician: Referring [name]: Treating [name]/Extender: [name], A NTO N Weeks in Treatment: 10 Compression Therapy Performed for Wound Assessment: Wound #1 Left  Ankle Performed By: Clinician [name] Iba, RN Compression Type: Three Layer Post Procedure Diagnosis Same as Pre-procedure Electronic Signature(s) Signed: 04/21/2021 5:09:48 PM By: [name] Entered By: [name] on 04/21/2021 08:54:00 -------------------------------------------------------------------------------- Encounter Discharge Information Details Patient Name: Date of Service: [name] 04/21/2021 8:15 A M Medical Record Number: [id number] Patient Account Number: [id number] Date of Birth/Sex: Treating RN: 1975-10-30 (45 y.o. [name] Primary Care Abygayle Deltoro: [name], A NTO N Other Clinician: Referring [name]: Treating [name]/Extender: [name], A NTO N Weeks in Treatment: 10 Encounter Discharge Information Items Discharge Condition: Stable Ambulatory Status: Crutches Discharge Destination: Home Transportation: Private Auto Schedule Follow-up Appointment: Yes Clinical Summary of Care: Provided on 04/21/2021 Form Type Recipient Paper Patient Patient Electronic Signature(s) Signed: 04/21/2021 5:09:48 PM By: [name] Entered By: [name] on 04/21/2021 09:18:58 -------------------------------------------------------------------------------- Lower Extremity Assessment Details Patient Name: Date of Service: [name] 04/21/2021 8:15 A M Medical Record Number: [id number] Patient Account Number: [id number] Date of Birth/Sex: Treating RN: 1975-10-06 (45 y.o. [name] Primary Care Kima Malenfant: [name], A NTO N Other Clinician: Referring [name]: Treating [name]/Extender: [name], A NTO N Weeks in Treatment: 10 Edema Assessment Assessed: [Left: Yes] [Right: No] Edema: [Left: N] [Right: o] Calf Left: Right: Point of Measurement: 28 cm From Medial Instep 30.5 cm Ankle Left: Right: Point of Measurement: 8 cm From Medial Instep 22.5 cm Vascular  Assessment Pulses: Dorsalis Pedis Palpable: [Left:Yes] Electronic Signature(s) Signed: 04/21/2021 5:09:48 PM By: [name] Entered By: [name] on 04/21/2021 08:39:02 -------------------------------------------------------------------------------- Multi Wound Chart Details Patient Name: Date of Service: [name] 04/21/2021 8:15 A M Medical Record Number: [id number] Patient Account Number: [id number] Date of Birth/Sex: Treating RN: 29-Nov-1975 (45 y.o. [name], [name] Primary Care Rhiley Tarver: [name], [name] Other Clinician: Referring [name]: Treating [name]/Extender: [name], A NTO N Weeks in Treatment:  10 Vital Signs Height(in): Pulse(bpm): 64 Weight(lbs): Blood Pressure(mmHg): 114/65 Body Mass Index(BMI): Temperature(F): 97.5 Respiratory Rate(breaths/min): 17 Photos: [N/A:N/A] Left Ankle N/A N/A Wound Location: Trauma N/A N/A Wounding Event: Trauma, Other N/A N/A Primary Etiology: 10/14/2020 N/A N/A Date Acquired: 10 N/A N/A Weeks of Treatment: Open N/A N/A Wound Status: 4.3x9x0.1 N/A N/A Measurements L x W x D (cm) 30.395 N/A N/A A (cm) : rea 3.039 N/A N/A Volume (cm) : 50.60% N/A N/A % Reduction in A rea: 87.70% N/A N/A % Reduction in Volume: Full Thickness Without Exposed N/A N/A Classification: Support Structures Medium N/A N/A Exudate Amount: Serosanguineous N/A N/A Exudate Type: red, brown N/A N/A Exudate Color: Distinct, outline attached N/A N/A Wound Margin: Large (67-100%) N/A N/A Granulation Amount: Red, Pink N/A N/A Granulation Quality: Small (1-33%) N/A N/A Necrotic Amount: Fat Layer (Subcutaneous Tissue): Yes N/A N/A Exposed Structures: Fascia: No Tendon: No Muscle: No Joint: No Bone: No Medium (34-66%) N/A N/A Epithelialization: Compression Therapy N/A N/A Procedures Performed: Treatment Notes Wound #1 (Ankle) Wound Laterality: Left Cleanser Soap and Water Discharge  Instruction: May shower and wash wound with dial antibacterial soap and water prior to dressing change. Wound Cleanser Discharge Instruction: Cleanse the wound with wound cleanser prior to applying a clean dressing using gauze sponges, not tissue or cotton balls. Peri-Wound Care Triamcinolone 15 (g) Discharge Instruction: T wound and leg o Zinc Oxide Ointment 30g tube Discharge Instruction: Apply Zinc Oxide to periwound with each dressing change Sween Lotion (Moisturizing lotion) Discharge Instruction: Apply moisturizing lotion as directed Topical Primary Dressing KerraCel Ag Gelling Fiber Dressing, 4x5 in (silver alginate) Discharge Instruction: Apply silver alginate to wound bed as instructed Secondary Dressing Zetuvit Plus 4x8 in Discharge Instruction: Apply over primary dressing as directed. CarboFLEX Odor Control Dressing, 4x4 in Discharge Instruction: Apply over primary dressing as directed. Secured With Compression Wrap ThreePress (3 layer compression wrap) Discharge Instruction: Apply three layer compression as directed. Compression Stockings Add-Ons Electronic Signature(s) Signed: 04/21/2021 4:30:53 PM By: Baltazar Najjar MD Signed: 04/21/2021 6:09:57 PM By: Fonnie Mu RN Entered By: Baltazar Najjar on 04/21/2021 09:24:48 -------------------------------------------------------------------------------- Multi-Disciplinary Care Plan Details Patient Name: Date of Service: [name] 04/21/2021 8:15 A M Medical Record Number: [id number] Patient Account Number: [id number] Date of Birth/Sex: Treating RN: March 16, 1976 (45 y.o. [name] Primary Care Janaia Kozel: [name], A NTO N Other Clinician: Referring Cherie Lasalle: Treating Ronel Rodeheaver/Extender: [name], A NTO N Weeks in Treatment: 10 Active Inactive Wound/Skin Impairment Nursing Diagnoses: Knowledge deficit related to ulceration/compromised skin integrity Goals: Patient/caregiver will  verbalize understanding of skin care regimen Date Initiated: 02/05/2021 Target Resolution Date: 05/19/2021 Goal Status: Active Interventions: Assess patient/caregiver ability to obtain necessary supplies Assess patient/caregiver ability to perform ulcer/skin care regimen upon admission and as needed Provide education on ulcer and skin care Treatment Activities: Skin care regimen initiated : 02/05/2021 Topical wound management initiated : 02/05/2021 Notes: 03/31/21: Wound care regimen ongoing, target date extended. 04/21/21: Wound care ongoing, through interpreter patient states he is doing fine with his dressing changes. Electronic Signature(s) Signed: 04/21/2021 5:09:48 PM By: [name] Entered By: [name] on 04/21/2021 08:36:39 -------------------------------------------------------------------------------- Pain Assessment Details Patient Name: Date of Service: [name] 04/21/2021 8:15 A M Medical Record Number: [id number] Patient Account Number: [id number] Date of Birth/Sex: Treating RN: 06/20/1976 (45 y.o. [name], [name] Primary Care Aslyn Cottman: [name], A NTO N Other Clinician: Referring Larsen Dungan: Treating Alyssamae Klinck/Extender: [name], A NTO N Weeks in Treatment: 10  Active Problems Location of Pain Severity and Description of Pain Patient Has Paino No Site Locations Pain Management and Medication Current Pain Management: Electronic Signature(s) Signed: 04/21/2021 4:02:45 PM By: [name] Signed: 04/21/2021 6:09:57 PM By: Fonnie Mu RN Entered By: [name] on 04/21/2021 08:23:08 -------------------------------------------------------------------------------- Patient/Caregiver Education Details Patient Name: Date of Service: [name] 9/20/2022andnbsp8:15 A M Medical Record Number: [id number] Patient Account Number: [id number] Date of Birth/Gender: Treating RN: 1976-07-28 (45 y.o. [name] Primary Care Physician: [name], A NTO N Other Clinician: Referring Physician: Treating Physician/Extender: [name], A NTO Otilio Saber in Treatment: 10 Education Assessment Education Provided To: Patient Education Topics Provided Offloading: Methods: Explain/Verbal, Printed Responses: State content correctly Venous: Methods: Demonstration, Explain/Verbal, Printed Responses: State content correctly Wound/Skin Impairment: Methods: Demonstration, Explain/Verbal, Printed Responses: State content correctly Electronic Signature(s) Signed: 04/21/2021 5:09:48 PM By: [name] Entered By: [name] on 04/21/2021 08:37:22 -------------------------------------------------------------------------------- Wound Assessment Details Patient Name: Date of Service: [name] 04/21/2021 8:15 A M Medical Record Number: [id number] Patient Account Number: [id number] Date of Birth/Sex: Treating RN: 12-03-1975 (45 y.o. [name], [name] Primary Care Jafet Wissing: [name], A NTO N Other Clinician: Referring Kellan Raffield: Treating Sourish Allender/Extender: [name], A NTO N Weeks in Treatment: 10 Wound Status Wound Number: 1 Primary Etiology: Trauma, Other Wound Location: Left Ankle Wound Status: Open Wounding Event: Trauma Date Acquired: 10/14/2020 Weeks Of Treatment: 10 Clustered Wound: No Photos Wound Measurements Length: (cm) 4.3 Width: (cm) 9 Depth: (cm) 0.1 Area: (cm) 30.395 Volume: (cm) 3.039 % Reduction in Area: 50.6% % Reduction in Volume: 87.7% Epithelialization: Medium (34-66%) Tunneling: No Undermining: No Wound Description Classification: Full Thickness Without Exposed Support Structures Wound Margin: Distinct, outline attached Exudate Amount: Medium Exudate Type: Serosanguineous Exudate Color: red, brown Foul Odor After Cleansing: No Slough/Fibrino Yes Wound Bed Granulation Amount: Large (67-100%) Exposed  Structure Granulation Quality: Red, Pink Fascia Exposed: No Necrotic Amount: Small (1-33%) Fat Layer (Subcutaneous Tissue) Exposed: Yes Necrotic Quality: Adherent Slough Tendon Exposed: No Muscle Exposed: No Joint Exposed: No Bone Exposed: No Treatment Notes Wound #1 (Ankle) Wound Laterality: Left Cleanser Soap and Water Discharge Instruction: May shower and wash wound with dial antibacterial soap and water prior to dressing change. Wound Cleanser Discharge Instruction: Cleanse the wound with wound cleanser prior to applying a clean dressing using gauze sponges, not tissue or cotton balls. Peri-Wound Care Triamcinolone 15 (g) Discharge Instruction: T wound and leg o Zinc Oxide Ointment 30g tube Discharge Instruction: Apply Zinc Oxide to periwound with each dressing change Sween Lotion (Moisturizing lotion) Discharge Instruction: Apply moisturizing lotion as directed Topical Primary Dressing KerraCel Ag Gelling Fiber Dressing, 4x5 in (silver alginate) Discharge Instruction: Apply silver alginate to wound bed as instructed Secondary Dressing Zetuvit Plus 4x8 in Discharge Instruction: Apply over primary dressing as directed. CarboFLEX Odor Control Dressing, 4x4 in Discharge Instruction: Apply over primary dressing as directed. Secured With Compression Wrap ThreePress (3 layer compression wrap) Discharge Instruction: Apply three layer compression as directed. Compression Stockings Add-Ons Electronic Signature(s) Signed: 04/21/2021 5:09:48 PM By: [name] Signed: 04/21/2021 6:09:57 PM By: Fonnie Mu RN Entered By: [name] on 04/21/2021 08:37:37 -------------------------------------------------------------------------------- Vitals Details Patient Name: Date of Service: [name], [name] 04/21/2021 8:15 A M Medical Record Number: [id number] Patient Account Number: [id number] Date of Birth/Sex: Treating RN: [birth year] (45 y.o. [name],  [name] Primary Care Elkin Belfield: [name], [name] Other Clinician: Referring Taylon Coole: Treating Ameliana Brashear/Extender: Verlan Friends  HBURA, A NTO N Weeks in Treatment: 10 Vital Signs Time Taken: 08:24 Temperature (F): 97.5 Pulse (bpm): 64 Respiratory Rate (breaths/min): 17 Blood Pressure (mmHg): 114/65 Reference Range: 80 - 120 mg / dl Electronic Signature(s) Signed: 04/21/2021 4:02:45 PM By: [name] Entered By: [name] on 04/21/2021 08:25:34

## 2021-04-21 NOTE — Assessment & Plan Note (Signed)
Will recheck creat, otherwise tolerating antibiotics well.

## 2021-04-21 NOTE — Assessment & Plan Note (Signed)
This seems to be improving by report and to see wound care 9/20 so leg not unwrapped today.  I am unclear though if it is improving with the antibiotics or infection not involved.  Regardless, I will have him continue with the 2 drug regimen for possible Mycobacterial infection for another month and reassess.  Will continue to follow wound care notes.

## 2021-04-24 ENCOUNTER — Encounter (HOSPITAL_BASED_OUTPATIENT_CLINIC_OR_DEPARTMENT_OTHER): Payer: Medicaid Other | Admitting: Internal Medicine

## 2021-04-24 DIAGNOSIS — L97328 Non-pressure chronic ulcer of left ankle with other specified severity: Secondary | ICD-10-CM | POA: Diagnosis not present

## 2021-04-24 DIAGNOSIS — I87332 Chronic venous hypertension (idiopathic) with ulcer and inflammation of left lower extremity: Secondary | ICD-10-CM | POA: Diagnosis not present

## 2021-04-26 NOTE — Assessment & Plan Note (Deleted)
Seems to be stable by report and followed by wound care. Currently using bactrim and clarithromycin along with wrapping for wound healing.

## 2021-04-26 NOTE — Progress Notes (Deleted)
  SUBJECTIVE:   CHIEF COMPLAINT / HPI:   Microscopic hematuria: previously noted to have >20 RBCs on urine microscopy.  Wound care:    PERTINENT  PMH / PSH: ***  OBJECTIVE:   There were no vitals taken for this visit. ***  General: NAD, pleasant, able to participate in exam Cardiac: RRR, no murmurs. Respiratory: CTAB, normal effort, No wheezes, rales or rhonchi Abdomen: Bowel sounds present, nontender, nondistended, no hepatosplenomegaly. Extremities: no edema or cyanosis. Skin: warm and dry, no rashes noted Neuro: alert, no obvious focal deficits Psych: Normal affect and mood  ASSESSMENT/PLAN:   No problem-specific Assessment & Plan notes found for this encounter.     [name], DO [facility]    {    This will disappear when note is signed, click to select method of visit    :1}

## 2021-04-26 NOTE — Assessment & Plan Note (Deleted)
Urine GC/CT today. Will consider Schistosoma if negative and referral to urology. Past smoker, may consider bladder cancer.

## 2021-04-27 ENCOUNTER — Ambulatory Visit: Payer: Medicaid Other | Admitting: Student

## 2021-04-27 DIAGNOSIS — L98499 Non-pressure chronic ulcer of skin of other sites with unspecified severity: Secondary | ICD-10-CM

## 2021-04-27 DIAGNOSIS — R3121 Asymptomatic microscopic hematuria: Secondary | ICD-10-CM

## 2021-04-28 ENCOUNTER — Encounter (HOSPITAL_BASED_OUTPATIENT_CLINIC_OR_DEPARTMENT_OTHER): Payer: Medicaid Other | Admitting: Internal Medicine

## 2021-04-28 ENCOUNTER — Other Ambulatory Visit: Payer: Self-pay

## 2021-04-28 DIAGNOSIS — I87332 Chronic venous hypertension (idiopathic) with ulcer and inflammation of left lower extremity: Secondary | ICD-10-CM | POA: Diagnosis not present

## 2021-04-28 DIAGNOSIS — L97328 Non-pressure chronic ulcer of left ankle with other specified severity: Secondary | ICD-10-CM | POA: Diagnosis not present

## 2021-04-28 DIAGNOSIS — L97322 Non-pressure chronic ulcer of left ankle with fat layer exposed: Secondary | ICD-10-CM | POA: Diagnosis not present

## 2021-04-30 NOTE — Progress Notes (Signed)
ALVESTER, EADS ([id number]) Visit Report for 04/24/2021 SuperBill Details Patient Name: Date of Service: [name] 04/24/2021 Medical Record Number: [id number] Patient Account Number: [id number] Date of Birth/Sex: Treating RN: 02-Sep-1975 (45 y.o. 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

## 2021-04-30 NOTE — Progress Notes (Signed)
[name], [name] ([id number]) Visit Report for 04/24/2021 Arrival Information Details Patient Name: Date of Service: [name] [place] NIE 04/24/2021 8:15 A M Medical Record Number: [id number] Patient Account Number: [id number] Date of Birth/Sex: Treating RN: 1976-05-02 (45 y.o. [name] Primary Care Christia Domke: Lovenia Shuck, A NTO N Other Clinician: Referring Saloma Cadena: Treating Nazar Kuan/Extender: Geralyn Corwin DA HBURA, A NTO N Weeks in Treatment: 11 Visit Information History Since Last Visit Added or deleted any medications: No Patient Arrived: Ambulatory Any new allergies or adverse reactions: No Arrival Time: 08:00 Had a fall or experienced change in No Accompanied By: alone activities of daily living that may affect Transfer Assistance: None risk of falls: Patient Identification Verified: Yes Signs or symptoms of abuse/neglect since last visito No Secondary Verification Process Completed: Yes Hospitalized since last visit: No Patient Requires Transmission-Based Precautions: No Implantable device outside of the clinic excluding No Patient Has Alerts: No cellular tissue based products placed in the center since last visit: Has Dressing in Place as Prescribed: Yes Has Compression in Place as Prescribed: Yes Pain Present Now: No Electronic Signature(s) Signed: 04/30/2021 4:49:13 PM By: Zandra Abts RN, BSN Entered By: Zandra Abts on 04/28/2021 15:06:13 -------------------------------------------------------------------------------- Compression Therapy Details Patient Name: Date of Service: [name] 04/24/2021 8:15 A M Medical Record Number: [id number] Patient Account Number: [id number] Date of Birth/Sex: Treating RN: 1975-08-22 (45 y.o. [name] Primary Care Dorin Stooksbury: Lovenia Shuck, A NTO N Other Clinician: Referring Amaiah Cristiano: Treating Chance Munter/Extender: Geralyn Corwin DA HBURA, A NTO N Weeks in Treatment: 11 Compression Therapy Performed for  Wound Assessment: Wound #1 Left Ankle Performed By: Clinician Zandra Abts, RN Compression Type: Three Emergency planning/management officer) Signed: 04/30/2021 4:49:13 PM By: Zandra Abts RN, BSN Entered By: Zandra Abts on 04/28/2021 15:07:28 -------------------------------------------------------------------------------- Encounter Discharge Information Details Patient Name: Date of Service: [name] 04/24/2021 8:15 A M Medical Record Number: [id number] Patient Account Number: [id number] Date of Birth/Sex: Treating RN: 1975/11/06 (45 y.o. [name] Primary Care Kenan Moodie: Lovenia Shuck, A NTO N Other Clinician: Referring Cedrik Heindl: Treating Korbyn Chopin/Extender: Geralyn Corwin DA HBURA, A NTO N Weeks in Treatment: 11 Encounter Discharge Information Items Discharge Condition: Stable Ambulatory Status: Ambulatory Discharge Destination: Home Transportation: Private Auto Accompanied By: alone Schedule Follow-up Appointment: Yes Clinical Summary of Care: Patient Declined Electronic Signature(s) Signed: 04/30/2021 4:49:13 PM By: Zandra Abts RN, BSN Entered By: Zandra Abts on 04/28/2021 15:07:57 -------------------------------------------------------------------------------- Wound Assessment Details Patient Name: Date of Service: [name] 04/24/2021 8:15 A M Medical Record Number: [id number] Patient Account Number: [id number] Date of Birth/Sex: Treating RN: [birth year] (45 y.o. [name] Primary Care Colbey Wirtanen: Lovenia Shuck, A NTO N Other Clinician: Referring Mazin Emma: Treating Aslan Montagna/Extender: Geralyn Corwin DA HBURA, A NTO N Weeks in Treatment: 11 Wound Status Wound Number: 1 Primary Etiology: Trauma, Other Wound Location: Left Ankle Wound Status: Open Wounding Event: Trauma Date Acquired: 10/14/2020 Weeks Of Treatment: 11 Clustered Wound: No Wound Measurements Length: (cm) 4.3 Width: (cm) 9 Depth: (cm) 0.1 Area: (cm) 30.395 Volume:  (cm) 3.039 % Reduction in Area: 50.6% % Reduction in Volume: 87.7% Epithelialization: Medium (34-66%) Wound Description Classification: Full Thickness Without Exposed Support Structu Wound Margin: Distinct, outline attached Exudate Amount: Medium Exudate Type: Serosanguineous Exudate Color: red, brown res Foul Odor After Cleansing: No Slough/Fibrino Yes Wound Bed Granulation Amount: Large (67-100%) Exposed Structure Granulation Quality: Red, Pink Fascia Exposed: No Necrotic Amount: Small (1-33%) Fat Layer (Subcutaneous Tissue) Exposed: Yes Necrotic Quality: Adherent Slough Tendon Exposed:  No Muscle Exposed: No Joint Exposed: No Bone Exposed: No Treatment Notes Wound #1 (Ankle) Wound Laterality: Left Cleanser Soap and Water Discharge Instruction: May shower and wash wound with dial antibacterial soap and water prior to dressing change. Wound Cleanser Discharge Instruction: Cleanse the wound with wound cleanser prior to applying a clean dressing using gauze sponges, not tissue or cotton balls. Peri-Wound Care Triamcinolone 15 (g) Discharge Instruction: T wound and leg o Zinc Oxide Ointment 30g tube Discharge Instruction: Apply Zinc Oxide to periwound with each dressing change Sween Lotion (Moisturizing lotion) Discharge Instruction: Apply moisturizing lotion as directed Topical Primary Dressing KerraCel Ag Gelling Fiber Dressing, 4x5 in (silver alginate) Discharge Instruction: Apply silver alginate to wound bed as instructed Secondary Dressing Zetuvit Plus 4x8 in Discharge Instruction: Apply over primary dressing as directed. CarboFLEX Odor Control Dressing, 4x4 in Discharge Instruction: Apply over primary dressing as directed. Secured With Compression Wrap ThreePress (3 layer compression wrap) Discharge Instruction: Apply three layer compression as directed. Compression Stockings Add-Ons Electronic Signature(s) Signed: 04/30/2021 4:49:13 PM By: Zandra Abts RN,  BSN Entered By: Zandra Abts on 04/28/2021 15:07:11

## 2021-04-30 NOTE — Progress Notes (Signed)
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 Coding ICD-10 Codes Code Description L97.328 Non-pressure chronic ulcer of left ankle with other specified severity I87.332 Chronic venous hypertension (idiopathic) with ulcer and inflammation of left lower extremity Facility Procedures Physician Procedures : CPT4 Code Description Modifier [id number] - WC PHYS LEVEL 3 - EST PT ICD-10 Diagnosis Description L97.328 Non-pressure chronic ulcer of left ankle with other specified severity I87.332 Chronic venous hypertension (idiopathic) with ulcer and  inflammation of left lower extremity Quantity: 1 Electronic Signature(s) Signed: 04/28/2021 4:21:38 PM By: Baltazar Najjar MD Entered By: Baltazar Najjar on 04/28/2021 11:57:17

## 2021-04-30 NOTE — Progress Notes (Signed)
MANSON, LUCKADOO ([id number]) Visit Report for 04/28/2021 Arrival Information Details Patient Name: Date of Service: [name] 04/28/2021 10:45 A M Medical Record Number: [id number] Patient Account Number: [id number] Date of Birth/Sex: Treating RN: September 29, 1975 (45 y.o. [name] Primary Care Oluwaseyi Tull: Lovenia Shuck, A NTO N Other Clinician: Referring Jodel Mayhall: Treating Aranza Geddes/Extender: Harvie Bridge, A NTO N Weeks in Treatment: 11 Visit Information History Since Last Visit Added or deleted any medications: No Patient Arrived: Crutches Any new allergies or adverse reactions: No Arrival Time: 10:52 Had a fall or experienced change in No Accompanied By: neighbor activities of daily living that may affect Transfer Assistance: None risk of falls: Patient Identification Verified: Yes Signs or symptoms of abuse/neglect since last visito No Secondary Verification Process Completed: Yes Hospitalized since last visit: No Patient Requires Transmission-Based Precautions: No Implantable device outside of the clinic excluding No Patient Has Alerts: No cellular tissue based products placed in the center since last visit: Has Dressing in Place as Prescribed: Yes Has Compression in Place as Prescribed: Yes Pain Present Now: No Electronic Signature(s) Signed: 04/30/2021 4:49:13 PM By: Zandra Abts RN, BSN Entered By: Zandra Abts on 04/28/2021 11:01:49 -------------------------------------------------------------------------------- Compression Therapy Details Patient Name: Date of Service: [name] 04/28/2021 10:45 A M Medical Record Number: [id number] Patient Account Number: [id number] Date of Birth/Sex: Treating RN: 1976/01/01 (45 y.o. [name] Primary Care Loreda Silverio: Lovenia Shuck, Glenis Smoker Other Clinician: Referring Jaynee Winters: Treating Nguyet Mercer/Extender: Harvie Bridge, A NTO N Weeks in Treatment: 11 Compression Therapy Performed  for Wound Assessment: Wound #1 Left Ankle Performed By: Clinician Zandra Abts, RN Compression Type: Three Layer Post Procedure Diagnosis Same as Pre-procedure Electronic Signature(s) Signed: 04/30/2021 4:49:13 PM By: Zandra Abts RN, BSN Entered By: Zandra Abts on 04/28/2021 11:09:11 -------------------------------------------------------------------------------- Encounter Discharge Information Details Patient Name: Date of Service: [name] 04/28/2021 10:45 A M Medical Record Number: [id number] Patient Account Number: [id number] Date of Birth/Sex: Treating RN: 05/09/1976 (45 y.o. [name] Primary Care Zavannah Deblois: Lovenia Shuck, Glenis Smoker Other Clinician: Referring Michiel Sivley: Treating Jailynn Lavalais/Extender: Harvie Bridge, A NTO N Weeks in Treatment: 11 Encounter Discharge Information Items Discharge Condition: Stable Ambulatory Status: Crutches Discharge Destination: Home Transportation: Private Auto Accompanied By: Enos Fling Schedule Follow-up Appointment: Yes Clinical Summary of Care: Patient Declined Electronic Signature(s) Signed: 04/30/2021 4:49:13 PM By: Zandra Abts RN, BSN Entered By: Zandra Abts on 04/28/2021 11:36:35 -------------------------------------------------------------------------------- Lower Extremity Assessment Details Patient Name: Date of Service: [name] 04/28/2021 10:45 A M Medical Record Number: [id number] Patient Account Number: [id number] Date of Birth/Sex: Treating RN: 05-22-1976 (45 y.o. [name] Primary Care Mehr Depaoli: Lovenia Shuck, Glenis Smoker Other Clinician: Referring Nimah Uphoff: Treating Marielle Mantione/Extender: Harvie Bridge, A NTO N Weeks in Treatment: 11 Edema Assessment Assessed: [Left: No] [Right: No] Edema: [Left: N] [Right: o] Calf Left: Right: Point of Measurement: 28 cm From Medial Instep 30.5 cm Ankle Left: Right: Point of Measurement: 8 cm From Medial Instep 22 cm Vascular  Assessment Pulses: Dorsalis Pedis Palpable: [Left:Yes] Electronic Signature(s) Signed: 04/30/2021 4:49:13 PM By: Zandra Abts RN, BSN Entered By: Zandra Abts on 04/28/2021 11:02:36 -------------------------------------------------------------------------------- Multi Wound Chart Details Patient Name: Date of Service: [name] 04/28/2021 10:45 A M Medical Record Number: [id number] Patient Account Number: [id number] Date of Birth/Sex: Treating RN: 18-Apr-1976 (45 y.o. [name], [name] Primary Care Cheyla Duchemin: Lovenia Shuck, A NTO N Other Clinician: Referring Randel Hargens: Treating Solly Derasmo/Extender:  Baltazar Najjar DA HBURA, A NTO N Weeks in Treatment: 11 Vital Signs Height(in): Pulse(bpm): 74 Weight(lbs): Blood Pressure(mmHg): 104/67 Body Mass Index(BMI): Temperature(F): 97.7 Respiratory Rate(breaths/min): 16 Photos: [N/A:N/A] Left Ankle N/A N/A Wound Location: Trauma N/A N/A Wounding Event: Trauma, Other N/A N/A Primary Etiology: 10/14/2020 N/A N/A Date Acquired: 11 N/A N/A Weeks of Treatment: Open N/A N/A Wound Status: 3.5x8x0.1 N/A N/A Measurements L x W x D (cm) 21.991 N/A N/A A (cm) : rea 2.199 N/A N/A Volume (cm) : 64.30% N/A N/A % Reduction in A rea: 91.10% N/A N/A % Reduction in Volume: Full Thickness Without Exposed N/A N/A Classification: Support Structures Medium N/A N/A Exudate Amount: Serosanguineous N/A N/A Exudate Type: red, brown N/A N/A Exudate Color: Distinct, outline attached N/A N/A Wound Margin: Medium (34-66%) N/A N/A Granulation Amount: Red, Pink N/A N/A Granulation Quality: Medium (34-66%) N/A N/A Necrotic Amount: Fat Layer (Subcutaneous Tissue): Yes N/A N/A Exposed Structures: Fascia: No Tendon: No Muscle: No Joint: No Bone: No Medium (34-66%) N/A N/A Epithelialization: Compression Therapy N/A N/A Procedures Performed: Treatment Notes Wound #1 (Ankle) Wound Laterality: Left Cleanser Soap and  Water Discharge Instruction: May shower and wash wound with dial antibacterial soap and water prior to dressing change. Wound Cleanser Discharge Instruction: Cleanse the wound with wound cleanser prior to applying a clean dressing using gauze sponges, not tissue or cotton balls. Peri-Wound Care Triamcinolone 15 (g) Discharge Instruction: T wound and leg o Zinc Oxide Ointment 30g tube Discharge Instruction: Apply Zinc Oxide to periwound with each dressing change Sween Lotion (Moisturizing lotion) Discharge Instruction: Apply moisturizing lotion as directed Topical Primary Dressing KerraCel Ag Gelling Fiber Dressing, 4x5 in (silver alginate) Discharge Instruction: Apply silver alginate to wound bed as instructed Secondary Dressing Zetuvit Plus 4x8 in Discharge Instruction: Apply over primary dressing as directed. CarboFLEX Odor Control Dressing, 4x4 in Discharge Instruction: Apply over primary dressing as directed. Secured With Compression Wrap ThreePress (3 layer compression wrap) Discharge Instruction: Apply three layer compression as directed. Compression Stockings Add-Ons Electronic Signature(s) Signed: 04/28/2021 4:21:38 PM By: Baltazar Najjar MD Signed: 04/29/2021 5:21:18 PM By: Fonnie Mu RN Entered By: Baltazar Najjar on 04/28/2021 11:52:51 -------------------------------------------------------------------------------- Multi-Disciplinary Care Plan Details Patient Name: Date of Service: [name] 04/28/2021 10:45 A M Medical Record Number: [id number] Patient Account Number: [id number] Date of Birth/Sex: Treating RN: 21-Jul-1976 (45 y.o. [name] Primary Care Chales Pelissier: Lovenia Shuck, Glenis Smoker Other Clinician: Referring Jaliah Foody: Treating Yu Peggs/Extender: Harvie Bridge, A NTO N Weeks in Treatment: 11 Active Inactive Wound/Skin Impairment Nursing Diagnoses: Knowledge deficit related to ulceration/compromised skin  integrity Goals: Patient/caregiver will verbalize understanding of skin care regimen Date Initiated: 02/05/2021 Target Resolution Date: 05/19/2021 Goal Status: Active Interventions: Assess patient/caregiver ability to obtain necessary supplies Assess patient/caregiver ability to perform ulcer/skin care regimen upon admission and as needed Provide education on ulcer and skin care Treatment Activities: Skin care regimen initiated : 02/05/2021 Topical wound management initiated : 02/05/2021 Notes: 03/31/21: Wound care regimen ongoing, target date extended. 04/21/21: Wound care ongoing, through interpreter patient states he is doing fine with his dressing changes. Electronic Signature(s) Signed: 04/30/2021 4:49:13 PM By: Zandra Abts RN, BSN Entered By: Zandra Abts on 04/28/2021 11:08:22 -------------------------------------------------------------------------------- Pain Assessment Details Patient Name: Date of Service: [name] 04/28/2021 10:45 A M Medical Record Number: [id number] Patient Account Number: [id number] Date of Birth/Sex: Treating RN: 02-27-1976 (45 y.o. [name] Primary Care Davontay Watlington: Lovenia Shuck, Glenis Smoker Other Clinician: Referring Sanders Manninen: Treating  Tameisha Covell/Extender: Harvie Bridge, A NTO N Weeks in Treatment: 11 Active Problems Location of Pain Severity and Description of Pain Patient Has Paino No Site Locations Pain Management and Medication Current Pain Management: Electronic Signature(s) Signed: 04/30/2021 4:49:13 PM By: Zandra Abts RN, BSN Entered By: Zandra Abts on 04/28/2021 11:02:20 -------------------------------------------------------------------------------- Patient/Caregiver Education Details Patient Name: Date of Service: [name] 9/27/2022andnbsp10:45 A M Medical Record Number: [id number] Patient Account Number: [id number] Date of Birth/Gender: Treating RN: 09-05-1975 (45 y.o. [name] Primary Care Physician: Lovenia Shuck, A NTO N Other Clinician: Referring Physician: Treating Physician/Extender: Harvie Bridge, A NTO N Weeks in Treatment: 11 Education Assessment Education Provided To: Patient Education Topics Provided Wound/Skin Impairment: Methods: Explain/Verbal Responses: State content correctly [employer]) Signed: 04/30/2021 4:49:13 PM By: Zandra Abts RN, BSN Entered By: Zandra Abts on 04/28/2021 11:08:55 -------------------------------------------------------------------------------- Wound Assessment Details Patient Name: Date of Service: [name] 04/28/2021 10:45 A M Medical Record Number: [id number] Patient Account Number: [id number] Date of Birth/Sex: Treating RN: 1976-02-17 (45 y.o. [name] Primary Care Rheda Kassab: Lovenia Shuck, A NTO N Other Clinician: Referring Oshea Percival: Treating Phillippe Orlick/Extender: Harvie Bridge, A NTO N Weeks in Treatment: 11 Wound Status Wound Number: 1 Primary Etiology: Trauma, Other Wound Location: Left Ankle Wound Status: Open Wounding Event: Trauma Date Acquired: 10/14/2020 Weeks Of Treatment: 11 Clustered Wound: No Photos Wound Measurements Length: (cm) 3.5 Width: (cm) 8 Depth: (cm) 0.1 Area: (cm) 21.991 Volume: (cm) 2.199 % Reduction in Area: 64.3% % Reduction in Volume: 91.1% Epithelialization: Medium (34-66%) Tunneling: No Undermining: No Wound Description Classification: Full Thickness Without Exposed Support Structures Wound Margin: Distinct, outline attached Exudate Amount: Medium Exudate Type: Serosanguineous Exudate Color: red, brown Foul Odor After Cleansing: No Slough/Fibrino Yes Wound Bed Granulation Amount: Medium (34-66%) Exposed Structure Granulation Quality: Red, Pink Fascia Exposed: No Necrotic Amount: Medium (34-66%) Fat Layer (Subcutaneous Tissue) Exposed: Yes Necrotic Quality: Adherent Slough Tendon Exposed: No Muscle  Exposed: No Joint Exposed: No Bone Exposed: No Electronic Signature(s) Signed: 04/28/2021 4:47:08 PM By: [name] Entered By: [name] on 04/28/2021 11:00:42 -------------------------------------------------------------------------------- Vitals Details Patient Name: Date of Service: [name] 04/28/2021 10:45 A M Medical Record Number: [id number] Patient Account Number: [id number] Date of Birth/Sex: Treating RN: 1976/03/14 (45 y.o. [name] Primary Care Thomas Rhude: Lovenia Shuck, A NTO N Other Clinician: Referring Annastasia Haskins: Treating Maudine Kluesner/Extender: Harvie Bridge, A NTO N Weeks in Treatment: 11 Vital Signs Time Taken: 10:52 Temperature (F): 97.7 Pulse (bpm): 74 Respiratory Rate (breaths/min): 16 Blood Pressure (mmHg): 104/67 Reference Range: 80 - 120 mg / dl Electronic Signature(s) Signed: 04/30/2021 4:49:13 PM By: Zandra Abts RN, BSN Entered By: Zandra Abts on 04/28/2021 11:02:07

## 2021-05-01 ENCOUNTER — Encounter (HOSPITAL_BASED_OUTPATIENT_CLINIC_OR_DEPARTMENT_OTHER): Payer: Medicaid Other | Admitting: Internal Medicine

## 2021-05-01 ENCOUNTER — Other Ambulatory Visit: Payer: Self-pay

## 2021-05-01 DIAGNOSIS — I87332 Chronic venous hypertension (idiopathic) with ulcer and inflammation of left lower extremity: Secondary | ICD-10-CM | POA: Diagnosis not present

## 2021-05-01 DIAGNOSIS — L97328 Non-pressure chronic ulcer of left ankle with other specified severity: Secondary | ICD-10-CM | POA: Diagnosis not present

## 2021-05-01 NOTE — Progress Notes (Signed)
KAELEN, CAUGHLIN ([id number]) Visit Report for 05/01/2021 SuperBill Details Patient Name: Date of Service: [name] 05/01/2021 Medical Record Number: [id number] Patient Account Number: [id number] Date of Birth/Sex: Treating RN: 01/26/1976 (45 y.o. [name] Primary Care Provider: Lovenia Shuck, Glenis Smoker Other Clinician: Referring Provider: Treating Provider/Extender: Harvie Bridge, A NTO N Weeks in Treatment: 12 Diagnosis Coding ICD-10 Codes Code Description [phone] Non-pressure chronic ulcer of left ankle with other specified severity I87.332 Chronic venous hypertension (idiopathic) with ulcer and inflammation of left lower extremity Facility Procedures CPT4 Code Description Modifier Quantity [id number] (Facility Use Only) [id number] - APPLY MULTLAY COMPRS LWR LT LEG 1 Electronic Signature(s) Signed: 05/01/2021 3:32:33 PM By: Shawn Stall RN, BSN Signed: 05/01/2021 3:46:44 PM By: Baltazar Najjar MD Entered By: Shawn Stall on 05/01/2021 11:39:12

## 2021-05-01 NOTE — Progress Notes (Signed)
IZIC, STFORT ([id number]) Visit Report for 05/01/2021 Arrival Information Details Patient Name: Date of Service: [name] [place] NIE 05/01/2021 11:15 A M Medical Record Number: [id number] Patient Account Number: [id number] Date of Birth/Sex: Treating RN: March 29, 1976 (46 y.o. Harlon Flor, Millard.Loa Primary Care Sweetie Giebler: Lovenia Shuck, A NTO N Other Clinician: Referring Lamarr Feenstra: Treating Josely Moffat/Extender: Harvie Bridge, A NTO N Weeks in Treatment: 12 Visit Information History Since Last Visit Added or deleted any medications: No Patient Arrived: Knee Scooter Any new allergies or adverse reactions: No Arrival Time: 11:30 Had a fall or experienced change in No Accompanied By: self activities of daily living that may affect Transfer Assistance: None risk of falls: Patient Identification Verified: Yes Signs or symptoms of abuse/neglect since last visito No Secondary Verification Process Completed: Yes Hospitalized since last visit: No Patient Requires Transmission-Based Precautions: No Implantable device outside of the clinic excluding No Patient Has Alerts: No cellular tissue based products placed in the center since last visit: Has Dressing in Place as Prescribed: Yes Has Compression in Place as Prescribed: Yes Pain Present Now: No Electronic Signature(s) Signed: 05/01/2021 3:32:33 PM By: Shawn Stall RN, BSN Entered By: Shawn Stall on 05/01/2021 11:37:49 -------------------------------------------------------------------------------- Compression Therapy Details Patient Name: Date of Service: [name] 05/01/2021 11:15 A M Medical Record Number: [id number] Patient Account Number: [id number] Date of Birth/Sex: Treating RN: Sep 20, 1975 (45 y.o. [name] Primary Care Magan Winnett: Lovenia Shuck, Glenis Smoker Other Clinician: Referring Amorie Rentz: Treating Enslee Bibbins/Extender: Harvie Bridge, A NTO N Weeks in Treatment: 12 Compression Therapy Performed for  Wound Assessment: Wound #1 Left Ankle Performed By: Clinician Shawn Stall, RN Compression Type: Three Emergency planning/management officer) Signed: 05/01/2021 3:32:33 PM By: Shawn Stall RN, BSN Entered By: Shawn Stall on 05/01/2021 11:38:34 -------------------------------------------------------------------------------- Encounter Discharge Information Details Patient Name: Date of Service: [name] 05/01/2021 11:15 A M Medical Record Number: [id number] Patient Account Number: [id number] Date of Birth/Sex: Treating RN: [birth year] (45 y.o. [name] Primary Care Michole Lecuyer: Lovenia Shuck, Glenis Smoker Other Clinician: Referring Suheyb Raucci: Treating Jannae Fagerstrom/Extender: Harvie Bridge, A NTO N Weeks in Treatment: 12 Encounter Discharge Information Items Discharge Condition: Stable Ambulatory Status: Crutches Discharge Destination: Home Transportation: Private Auto Accompanied By: self Schedule Follow-up Appointment: Yes Clinical Summary of Care: Electronic Signature(s) Signed: 05/01/2021 3:32:33 PM By: Shawn Stall RN, BSN Entered By: Shawn Stall on 05/01/2021 11:39:05 -------------------------------------------------------------------------------- Patient/Caregiver Education Details Patient Name: Date of Service: [name] 9/30/2022andnbsp11:15 A M Medical Record Number: [id number] Patient Account Number: [id number] Date of Birth/Gender: Treating RN: [birth year] (45 y.o. [name] Primary Care Physician: Lovenia Shuck, A NTO N Other Clinician: Referring Physician: Treating Physician/Extender: Harvie Bridge, A NTO N Weeks in Treatment: 12 Education Assessment Education Provided To: Patient Education Topics Provided Wound/Skin Impairment: Handouts: Skin Care Do's and Dont's Methods: Explain/Verbal Responses: Reinforcements needed Electronic Signature(s) Signed: 05/01/2021 3:32:33 PM By: Shawn Stall RN, BSN Entered By: Shawn Stall  on 05/01/2021 11:38:56 -------------------------------------------------------------------------------- Wound Assessment Details Patient Name: Date of Service: [name] 05/01/2021 11:15 A M Medical Record Number: [id number] Patient Account Number: [id number] Date of Birth/Sex: Treating RN: [birth year] (45 y.o. [name] Primary Care Jahniyah Revere: Lovenia Shuck, Glenis Smoker Other Clinician: Referring Rossi Burdo: Treating Noal Abshier/Extender: Harvie Bridge, A NTO N Weeks in Treatment: 12 Wound Status Wound Number: 1 Primary Etiology: Trauma, Other Wound Location: Left Ankle Wound Status: Open Wounding Event: Trauma Date Acquired: 10/14/2020  Weeks Of Treatment: 12 Clustered Wound: No Wound Measurements Length: (cm) 3.5 Width: (cm) 8 Depth: (cm) 0.1 Area: (cm) 21.991 Volume: (cm) 2.199 % Reduction in Area: 64.3% % Reduction in Volume: 91.1% Epithelialization: Medium (34-66%) Tunneling: No Undermining: No Wound Description Classification: Full Thickness Without Exposed Support Structures Wound Margin: Distinct, outline attached Exudate Amount: Medium Exudate Type: Serosanguineous Exudate Color: red, brown Foul Odor After Cleansing: No Slough/Fibrino Yes Wound Bed Granulation Amount: Large (67-100%) Exposed Structure Granulation Quality: Red, Pink Fascia Exposed: No Necrotic Amount: Small (1-33%) Fat Layer (Subcutaneous Tissue) Exposed: Yes Necrotic Quality: Adherent Slough Tendon Exposed: No Muscle Exposed: No Joint Exposed: No Bone Exposed: No Treatment Notes Wound #1 (Ankle) Wound Laterality: Left Cleanser Soap and Water Discharge Instruction: May shower and wash wound with dial antibacterial soap and water prior to dressing change. Wound Cleanser Discharge Instruction: Cleanse the wound with wound cleanser prior to applying a clean dressing using gauze sponges, not tissue or cotton balls. Peri-Wound Care Triamcinolone 15 (g) Discharge  Instruction: T wound and leg o Zinc Oxide Ointment 30g tube Discharge Instruction: Apply Zinc Oxide to periwound with each dressing change Sween Lotion (Moisturizing lotion) Discharge Instruction: Apply moisturizing lotion as directed Topical Primary Dressing KerraCel Ag Gelling Fiber Dressing, 4x5 in (silver alginate) Discharge Instruction: Apply silver alginate to wound bed as instructed Secondary Dressing Zetuvit Plus 4x8 in Discharge Instruction: Apply over primary dressing as directed. CarboFLEX Odor Control Dressing, 4x4 in Discharge Instruction: Apply over primary dressing as directed. Secured With Compression Wrap ThreePress (3 layer compression wrap) Discharge Instruction: Apply three layer compression as directed. Compression Stockings Add-Ons Electronic Signature(s) Signed: 05/01/2021 3:32:33 PM By: Shawn Stall RN, BSN Entered By: Shawn Stall on 05/01/2021 11:38:22 -------------------------------------------------------------------------------- Vitals Details Patient Name: Date of Service: [name] 05/01/2021 11:15 A M Medical Record Number: [id number] Patient Account Number: [id number] Date of Birth/Sex: Treating RN: 04/11/1976 (45 y.o. [name] Primary Care Makalah Asberry: Lovenia Shuck, A NTO N Other Clinician: Referring Oney Tatlock: Treating Alta Goding/Extender: Harvie Bridge, A NTO N Weeks in Treatment: 12 Vital Signs Time Taken: 11:30 Temperature (F): 98.1 Pulse (bpm): 58 Respiratory Rate (breaths/min): 16 Blood Pressure (mmHg): 103/53 Reference Range: 80 - 120 mg / dl Electronic Signature(s) Signed: 05/01/2021 3:32:33 PM By: Shawn Stall RN, BSN Entered By: Shawn Stall on 05/01/2021 11:38:05

## 2021-05-02 DIAGNOSIS — Z419 Encounter for procedure for purposes other than remedying health state, unspecified: Secondary | ICD-10-CM | POA: Diagnosis not present

## 2021-05-05 ENCOUNTER — Other Ambulatory Visit: Payer: Self-pay

## 2021-05-05 ENCOUNTER — Encounter (HOSPITAL_BASED_OUTPATIENT_CLINIC_OR_DEPARTMENT_OTHER): Payer: Medicaid Other | Attending: Internal Medicine | Admitting: Internal Medicine

## 2021-05-05 DIAGNOSIS — F17200 Nicotine dependence, unspecified, uncomplicated: Secondary | ICD-10-CM | POA: Insufficient documentation

## 2021-05-05 DIAGNOSIS — L97322 Non-pressure chronic ulcer of left ankle with fat layer exposed: Secondary | ICD-10-CM | POA: Diagnosis not present

## 2021-05-05 DIAGNOSIS — I87332 Chronic venous hypertension (idiopathic) with ulcer and inflammation of left lower extremity: Secondary | ICD-10-CM | POA: Insufficient documentation

## 2021-05-06 NOTE — Progress Notes (Signed)
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 Care: Zinc Oxide Ointment 30g tube 2 x Per Week/ Discharge Instructions: Apply Zinc Oxide to periwound with each dressing change Peri-Wound Care: Sween Lotion (Moisturizing lotion) 2 x Per Week/ Discharge Instructions: Apply moisturizing lotion as directed Prim Dressing: KerraCel Ag Gelling Fiber Dressing, 4x5 in (silver alginate) 2 x Per Week/ ary Discharge Instructions: Apply silver alginate to wound bed as instructed Secondary Dressing: Zetuvit Plus 4x8 in 2 x Per Week/ Discharge Instructions: Apply over primary dressing as directed. Secondary Dressing: CarboFLEX Odor Control Dressing, 4x4 in 2 x Per Week/ Discharge Instructions: Apply over primary dressing as directed. Com pression Wrap: ThreePress (3 layer compression wrap) 2 x Per Week/ Discharge Instructions: Apply three layer compression as directed. 1. Still using silver alginate on the wound under 3 layer compression backed with Zetuvit 2. He has been coming in for a nurse visit for a dressing change. We will see if we can put that out for a week. This would be important in determining whether we can use TheraSkin and simply bring him out for a nurse  visit on alternating weeks. 3. Vein and vascular consult I am doubtful from my look at the reflux studies and anything can be done however I want to make sure in this man who will be working for long hours on his [employer]) Signed: 05/06/2021 10:14:24 AM By: Baltazar Najjar MD Entered By: Baltazar Najjar on 05/05/2021 09:34:29 -------------------------------------------------------------------------------- SuperBill Details Patient Name: Date of Service: [name] 05/05/2021 Medical Record Number: [id number] Patient Account Number: [id number] Date of Birth/Sex: Treating RN: January 28, 1976 (45 y.o. [name] Primary Care  Provider: Lovenia Shuck, Glenis Smoker Other Clinician: Referring Provider: Treating Provider/Extender: Harvie Bridge, A NTO N Weeks in Treatment: 12 Diagnosis Coding ICD-10 Codes Code Description L97.328 Non-pressure chronic ulcer of left ankle with other specified severity I87.332 Chronic venous hypertension (idiopathic) with ulcer and inflammation of left lower extremity Facility Procedures CPT4 Code: 16109604 Description: (Facility Use Only) [phone] - APPLY MULTLAY COMPRS LWR LT LEG Modifier: Quantity: 1 Physician Procedures : CPT4 Code Description Modifier [id number] - WC PHYS LEVEL 3 - EST PT ICD-10 Diagnosis Description L97.328 Non-pressure chronic ulcer of left ankle with other specified severity I87.332 Chronic venous hypertension (idiopathic) with ulcer and  inflammation of left lower extremity Quantity: 1 Electronic Signature(s) Signed: 05/06/2021 10:14:24 AM By: Baltazar Najjar MD Entered By: Baltazar Najjar on 05/05/2021 09:34:46

## 2021-05-06 NOTE — Progress Notes (Signed)
DERAK, SCHURMAN ([id number]) Visit Report for 05/05/2021 Arrival Information Details Patient Name: Date of Service: [name] [place] NIE 05/05/2021 8:45 A M Medical Record Number: [id number] Patient Account Number: [id number] Date of Birth/Sex: Treating RN: 05-Nov-1975 (45 y.o. [name] Primary Care Wallice Granville: Lovenia Shuck, A NTO N Other Clinician: Referring Jocilynn Grade: Treating Qamar Rosman/Extender: Harvie Bridge, A NTO N Weeks in Treatment: 12 Visit Information History Since Last Visit Added or deleted any medications: No Patient Arrived: Crutches Any new allergies or adverse reactions: No Arrival Time: 09:01 Had a fall or experienced change in No Accompanied By: interpreter activities of daily living that may affect Transfer Assistance: None risk of falls: Patient Identification Verified: Yes Signs or symptoms of abuse/neglect since last visito No Secondary Verification Process Completed: Yes Hospitalized since last visit: No Patient Requires Transmission-Based Precautions: No Implantable device outside of the clinic excluding No Patient Has Alerts: No cellular tissue based products placed in the center since last visit: Has Dressing in Place as Prescribed: Yes Has Compression in Place as Prescribed: Yes Pain Present Now: No Electronic Signature(s) Signed: 05/05/2021 5:33:33 PM By: Zandra Abts RN, BSN Entered By: Zandra Abts on 05/05/2021 09:11:41 -------------------------------------------------------------------------------- Compression Therapy Details Patient Name: Date of Service: [name] 05/05/2021 8:45 A M Medical Record Number: [id number] Patient Account Number: [id number] Date of Birth/Sex: Treating RN: May 30, 1976 (46 y.o. [name] Primary Care Cal Gindlesperger: Lovenia Shuck, Glenis Smoker Other Clinician: Referring Daysen Gundrum: Treating Merve Hotard/Extender: Harvie Bridge, A NTO N Weeks in Treatment: 12 Compression Therapy Performed  for Wound Assessment: Wound #1 Left Ankle Performed By: Clinician Zandra Abts, RN Compression Type: Three Layer Post Procedure Diagnosis Same as Pre-procedure Electronic Signature(s) Signed: 05/05/2021 5:33:33 PM By: Zandra Abts RN, BSN Entered By: Zandra Abts on 05/05/2021 09:24:39 -------------------------------------------------------------------------------- Encounter Discharge Information Details Patient Name: Date of Service: [name] 05/05/2021 8:45 A M Medical Record Number: [id number] Patient Account Number: [id number] Date of Birth/Sex: Treating RN: 1975/08/16 (45 y.o. [name] Primary Care Kinlie Janice: Lovenia Shuck, Glenis Smoker Other Clinician: Referring Carols Clemence: Treating Sheppard Luckenbach/Extender: Harvie Bridge, A NTO N Weeks in Treatment: 12 Encounter Discharge Information Items Discharge Condition: Stable Ambulatory Status: Crutches Discharge Destination: Home Transportation: Private Auto Accompanied By: interpreter Schedule Follow-up Appointment: Yes Clinical Summary of Care: Patient Declined Electronic Signature(s) Signed: 05/05/2021 5:33:33 PM By: Zandra Abts RN, BSN Entered By: Zandra Abts on 05/05/2021 09:34:28 -------------------------------------------------------------------------------- Lower Extremity Assessment Details Patient Name: Date of Service: [name] 05/05/2021 8:45 A M Medical Record Number: [id number] Patient Account Number: [id number] Date of Birth/Sex: Treating RN: 1976/08/02 (45 y.o. [name] Primary Care Daleah Coulson: Lovenia Shuck, Glenis Smoker Other Clinician: Referring Conchetta Lamia: Treating Bryar Dahms/Extender: Harvie Bridge, A NTO N Weeks in Treatment: 12 Edema Assessment Assessed: [Left: No] [Right: No] Edema: [Left: N] [Right: o] Calf Left: Right: Point of Measurement: 28 cm From Medial Instep 30.5 cm Ankle Left: Right: Point of Measurement: 8 cm From Medial Instep 21 cm Vascular  Assessment Pulses: Dorsalis Pedis Palpable: [Left:Yes] Electronic Signature(s) Signed: 05/05/2021 5:33:33 PM By: Zandra Abts RN, BSN Entered By: Zandra Abts on 05/05/2021 09:12:05 -------------------------------------------------------------------------------- Multi Wound Chart Details Patient Name: Date of Service: [name] 05/05/2021 8:45 A M Medical Record Number: [id number] Patient Account Number: [id number] Date of Birth/Sex: Treating RN: 1975/08/08 (45 y.o. [name] Primary Care Decarlos Empey: Lovenia Shuck, Glenis Smoker Other Clinician: Referring Kelsey Durflinger: Treating Malekai Markwood/Extender:  Baltazar Najjar DA HBURA, A NTO N Weeks in Treatment: 12 Vital Signs Height(in): Pulse(bpm): 60 Weight(lbs): Blood Pressure(mmHg): 106/68 Body Mass Index(BMI): Temperature(F): 97.8 Respiratory Rate(breaths/min): 18 Photos: [N/A:N/A] Left Ankle N/A N/A Wound Location: Trauma N/A N/A Wounding Event: Trauma, Other N/A N/A Primary Etiology: 10/14/2020 N/A N/A Date Acquired: 12 N/A N/A Weeks of Treatment: Open N/A N/A Wound Status: 3x7.9x0.2 N/A N/A Measurements L x W x D (cm) 18.614 N/A N/A A (cm) : rea 3.723 N/A N/A Volume (cm) : 69.80% N/A N/A % Reduction in A rea: 84.90% N/A N/A % Reduction in Volume: Full Thickness Without Exposed N/A N/A Classification: Support Structures Medium N/A N/A Exudate Amount: Serosanguineous N/A N/A Exudate Type: red, brown N/A N/A Exudate Color: Distinct, outline attached N/A N/A Wound Margin: Large (67-100%) N/A N/A Granulation Amount: Red, Pink N/A N/A Granulation Quality: Small (1-33%) N/A N/A Necrotic Amount: Fat Layer (Subcutaneous Tissue): Yes N/A N/A Exposed Structures: Fascia: No Tendon: No Muscle: No Joint: No Bone: No Medium (34-66%) N/A N/A Epithelialization: Compression Therapy N/A N/A Procedures Performed: Treatment Notes Electronic Signature(s) Signed: 05/05/2021 5:32:19 PM By: Shawn Stall RN,  BSN Signed: 05/06/2021 10:14:24 AM By: Baltazar Najjar MD Entered By: Baltazar Najjar on 05/05/2021 09:25:13 -------------------------------------------------------------------------------- Multi-Disciplinary Care Plan Details Patient Name: Date of Service: [name] 05/05/2021 8:45 A M Medical Record Number: [id number] Patient Account Number: [id number] Date of Birth/Sex: Treating RN: 1975-11-02 (45 y.o. [name] Primary Care Naomi Fitton: Lovenia Shuck, Glenis Smoker Other Clinician: Referring Casy Brunetto: Treating Malaijah Houchen/Extender: Harvie Bridge, A NTO N Weeks in Treatment: 12 Active Inactive Wound/Skin Impairment Nursing Diagnoses: Knowledge deficit related to ulceration/compromised skin integrity Goals: Patient/caregiver will verbalize understanding of skin care regimen Date Initiated: 02/05/2021 Target Resolution Date: 05/19/2021 Goal Status: Active Interventions: Assess patient/caregiver ability to obtain necessary supplies Assess patient/caregiver ability to perform ulcer/skin care regimen upon admission and as needed Provide education on ulcer and skin care Treatment Activities: Skin care regimen initiated : 02/05/2021 Topical wound management initiated : 02/05/2021 Notes: 03/31/21: Wound care regimen ongoing, target date extended. 04/21/21: Wound care ongoing, through interpreter patient states he is doing fine with his dressing changes. Electronic Signature(s) Signed: 05/05/2021 5:33:33 PM By: Zandra Abts RN, BSN Entered By: Zandra Abts on 05/05/2021 09:33:19 -------------------------------------------------------------------------------- Pain Assessment Details Patient Name: Date of Service: [name] 05/05/2021 8:45 A M Medical Record Number: [id number] Patient Account Number: [id number] Date of Birth/Sex: Treating RN: 04/24/1976 (45 y.o. [name] Primary Care Dulcey Riederer: Lovenia Shuck, Glenis Smoker Other Clinician: Referring  Siniyah Evangelist: Treating Aylla Huffine/Extender: Harvie Bridge, A NTO N Weeks in Treatment: 12 Active Problems Location of Pain Severity and Description of Pain Patient Has Paino No Site Locations Pain Management and Medication Current Pain Management: Electronic Signature(s) Signed: 05/05/2021 5:33:33 PM By: Zandra Abts RN, BSN Entered By: Zandra Abts on 05/05/2021 09:11:59 -------------------------------------------------------------------------------- Patient/Caregiver Education Details Patient Name: Date of Service: [name] 10/4/2022andnbsp8:45 A M Medical Record Number: [id number] Patient Account Number: [id number] Date of Birth/Gender: Treating RN: Dec 19, 1975 (45 y.o. [name] Primary Care Physician: Lovenia Shuck, A NTO N Other Clinician: Referring Physician: Treating Physician/Extender: Harvie Bridge, A NTO N Weeks in Treatment: 12 Education Assessment Education Provided To: Patient Education Topics Provided Wound/Skin Impairment: Methods: Explain/Verbal Responses: State content correctly Electronic Signature(s) Signed: 05/05/2021 5:33:33 PM By: Zandra Abts RN, BSN Entered By: Zandra Abts on 05/05/2021 09:33:30 -------------------------------------------------------------------------------- Wound Assessment Details Patient Name: Date of Service: [name]  [name] [name] 05/05/2021 8:45 A M Medical Record Number: [id number] Patient Account Number: [id number] Date of Birth/Sex: Treating RN: [birth year] (45 y.o. [name] Primary Care Brielyn Bosak: Lovenia Shuck, A NTO N Other Clinician: Referring Rockney Grenz: Treating Olis Viverette/Extender: Harvie Bridge, A NTO N Weeks in Treatment: 12 Wound Status Wound Number: 1 Primary Etiology: Trauma, Other Wound Location: Left Ankle Wound Status: Open Wounding Event: Trauma Date Acquired: 10/14/2020 Weeks Of Treatment: 12 Clustered Wound: No Photos Wound Measurements Length:  (cm) 3 Width: (cm) 7.9 Depth: (cm) 0.2 Area: (cm) 18.614 Volume: (cm) 3.723 % Reduction in Area: 69.8% % Reduction in Volume: 84.9% Epithelialization: Medium (34-66%) Tunneling: No Undermining: No Wound Description Classification: Full Thickness Without Exposed Support Structures Wound Margin: Distinct, outline attached Exudate Amount: Medium Exudate Type: Serosanguineous Exudate Color: red, brown Foul Odor After Cleansing: No Slough/Fibrino Yes Wound Bed Granulation Amount: Large (67-100%) Exposed Structure Granulation Quality: Red, Pink Fascia Exposed: No Necrotic Amount: Small (1-33%) Fat Layer (Subcutaneous Tissue) Exposed: Yes Necrotic Quality: Adherent Slough Tendon Exposed: No Muscle Exposed: No Joint Exposed: No Bone Exposed: No Treatment Notes Wound #1 (Ankle) Wound Laterality: Left Cleanser Soap and Water Discharge Instruction: May shower and wash wound with dial antibacterial soap and water prior to dressing change. Wound Cleanser Discharge Instruction: Cleanse the wound with wound cleanser prior to applying a clean dressing using gauze sponges, not tissue or cotton balls. Peri-Wound Care Triamcinolone 15 (g) Discharge Instruction: T wound and leg o Zinc Oxide Ointment 30g tube Discharge Instruction: Apply Zinc Oxide to periwound with each dressing change Sween Lotion (Moisturizing lotion) Discharge Instruction: Apply moisturizing lotion as directed Topical Primary Dressing KerraCel Ag Gelling Fiber Dressing, 4x5 in (silver alginate) Discharge Instruction: Apply silver alginate to wound bed as instructed Secondary Dressing Zetuvit Plus 4x8 in Discharge Instruction: Apply over primary dressing as directed. CarboFLEX Odor Control Dressing, 4x4 in Discharge Instruction: Apply over primary dressing as directed. Secured With Compression Wrap ThreePress (3 layer compression wrap) Discharge Instruction: Apply three layer compression as  directed. Compression Stockings Add-Ons Electronic Signature(s) Signed: 05/05/2021 5:33:33 PM By: Zandra Abts RN, BSN Entered By: Zandra Abts on 05/05/2021 09:10:09 -------------------------------------------------------------------------------- Vitals Details Patient Name: Date of Service: [name] 05/05/2021 8:45 A M Medical Record Number: [id number] Patient Account Number: [id number] Date of Birth/Sex: Treating RN: February 26, 1976 (45 y.o. [name] Primary Care Brandonn Capelli: Lovenia Shuck, A NTO N Other Clinician: Referring Zalea Pete: Treating Lynnwood Beckford/Extender: Harvie Bridge, A NTO N Weeks in Treatment: 12 Vital Signs Time Taken: 09:01 Temperature (F): 97.8 Pulse (bpm): 60 Respiratory Rate (breaths/min): 18 Blood Pressure (mmHg): 106/68 Reference Range: 80 - 120 mg / dl Electronic Signature(s) Signed: 05/05/2021 5:33:33 PM By: Zandra Abts RN, BSN Entered By: Zandra Abts on 05/05/2021 09:11:54

## 2021-05-12 ENCOUNTER — Encounter (HOSPITAL_BASED_OUTPATIENT_CLINIC_OR_DEPARTMENT_OTHER): Payer: Medicaid Other | Admitting: Internal Medicine

## 2021-05-15 ENCOUNTER — Other Ambulatory Visit: Payer: Self-pay

## 2021-05-15 ENCOUNTER — Encounter (HOSPITAL_BASED_OUTPATIENT_CLINIC_OR_DEPARTMENT_OTHER): Payer: Medicaid Other | Admitting: Internal Medicine

## 2021-05-15 DIAGNOSIS — I87332 Chronic venous hypertension (idiopathic) with ulcer and inflammation of left lower extremity: Secondary | ICD-10-CM | POA: Diagnosis not present

## 2021-05-15 DIAGNOSIS — F17200 Nicotine dependence, unspecified, uncomplicated: Secondary | ICD-10-CM | POA: Diagnosis not present

## 2021-05-15 DIAGNOSIS — L97322 Non-pressure chronic ulcer of left ankle with fat layer exposed: Secondary | ICD-10-CM | POA: Diagnosis not present

## 2021-05-15 NOTE — Progress Notes (Signed)
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 Zinc Oxide Ointment 30g tube 2 x Per Week/ Discharge Instructions: Apply Zinc Oxide to periwound with each dressing change Peri-Wound Care: Sween Lotion (Moisturizing lotion) 2 x Per Week/ Discharge Instructions: Apply moisturizing lotion as directed Prim Dressing: KerraCel Ag Gelling Fiber Dressing, 4x5 in (silver alginate) 2 x Per Week/ ary Discharge Instructions: Apply silver alginate to wound bed as instructed Secondary Dressing: Zetuvit Plus 4x8 in 2 x Per Week/ Discharge Instructions: Apply over primary dressing as directed. Secondary Dressing: CarboFLEX Odor Control Dressing, 4x4 in 2 x Per Week/ Discharge Instructions: Apply over primary dressing as directed. Com pression Wrap: ThreePress (3 layer compression wrap) 2 x Per Week/ Discharge Instructions: Apply three layer compression as directed. #1 I am continuing with silver alginate as the primary dressing. 2. Wound does not look bad in spite of the odor and collecting drainage 3. If this is the same next week that he is and I will have to consider a deep tissue culture once again perhaps [facility]. 4. Given the drainage there is no option but silver alginate at this  point Electronic Signature(s) Signed: 05/15/2021 12:39:46 PM By: Baltazar Najjar MD Entered By: Baltazar Najjar on 05/15/2021 09:42:12 -------------------------------------------------------------------------------- SuperBill Details Patient Name: Date of Service: [name] [name] [name] 05/15/2021 Medical Record Number: [id number] Patient Account Number: [id number] Date of Birth/Sex: Treating RN: July 05, 1976 (45 y.o. [name] Primary Care Provider: [name] Other Clinician: Referring Provider: Treating Provider/Extender: Darleen Crocker in Treatment: 14 Diagnosis Coding ICD-10 Codes Code Description [phone] Non-pressure chronic ulcer of left ankle with other specified severity I87.332 Chronic venous hypertension (idiopathic) with ulcer and inflammation of left lower extremity Facility Procedures CPT4 Code: [id number] ( Description: Facility Use Only) [phone] - APPLY MULTLAY COMPRS LWR LT LEG ICD-10 Diagnosis Description L97.328 Non-pressure chronic ulcer of left ankle with other  specified severity Modifier: Quantity: 1 Physician Procedures : CPT4 Code Description Modifier [id number] - WC PHYS LEVEL 3 - EST PT ICD-10 Diagnosis Description L97.328 Non-pressure chronic ulcer of left ankle with other specified severity I87.332 Chronic venous hypertension (idiopathic) with ulcer and  inflammation of left lower extremity Quantity: 1 Electronic Signature(s) Signed: 05/15/2021 12:39:46 PM By: Baltazar Najjar MD Entered By: Baltazar Najjar on 05/15/2021 09:42:53

## 2021-05-18 ENCOUNTER — Other Ambulatory Visit: Payer: Self-pay

## 2021-05-18 ENCOUNTER — Encounter: Payer: Self-pay | Admitting: Internal Medicine

## 2021-05-18 ENCOUNTER — Ambulatory Visit (INDEPENDENT_AMBULATORY_CARE_PROVIDER_SITE_OTHER): Payer: Medicaid Other | Admitting: Internal Medicine

## 2021-05-18 VITALS — BP 102/69 | HR 66 | Temp 97.6°F | Resp 16 | Wt 153.0 lb

## 2021-05-18 DIAGNOSIS — L98499 Non-pressure chronic ulcer of skin of other sites with unspecified severity: Secondary | ICD-10-CM

## 2021-05-18 DIAGNOSIS — Z5181 Encounter for therapeutic drug level monitoring: Secondary | ICD-10-CM

## 2021-05-18 DIAGNOSIS — Z720 Tobacco use: Secondary | ICD-10-CM | POA: Diagnosis not present

## 2021-05-18 NOTE — Progress Notes (Signed)
   Subjective:    Patient ID: [name], male    DOB: [birth year], 45 y.o.   MRN: [id number]  HPI Here for his left lower extremity ulcer and concern for infection.   This has been a chronic issue and concern for an infectious etiology among other possibilities and has been on treatment with Bactrim and clarithromycin for possible Mycobacterial or other indolent infection.  Culture/biopsy was negative for any organism.  His ulcer is slowly improving and no current signs of infection noted.  No rash or diarrhea.     Review of Systems  Constitutional:  Negative for chills, fatigue and fever.  Gastrointestinal:  Negative for diarrhea.  Skin:  Negative for rash.      Objective:   Physical Exam Eyes:     General: No scleral icterus. Musculoskeletal:     Comments: Leg wrapped  Neurological:     Mental Status: He is alert.  Psychiatric:        Mood and Affect: Mood normal.          Assessment & Plan:

## 2021-05-18 NOTE — Progress Notes (Signed)
RITHIK, ODEA ([id number]) Visit Report for 05/15/2021 Arrival Information Details Patient Name: Date of Service: [name] 05/15/2021 8:30 A M Medical Record Number: [id number] Patient Account Number: [id number] Date of Birth/Sex: Treating RN: 09-15-1975 (45 y.o. [name] Primary Care Amelia Macken: [name] Other Clinician: Referring Kaoir Loree: Treating Toriana Sponsel/Extender: Darleen Crocker in Treatment: 14 Visit Information History Since Last Visit Added or deleted any medications: No Patient Arrived: Crutches Any new allergies or adverse reactions: No Arrival Time: 08:47 Had a fall or experienced change in No Transfer Assistance: None activities of daily living that may affect Patient Identification Verified: Yes risk of falls: Secondary Verification Process Completed: Yes Signs or symptoms of abuse/neglect since No Patient Requires Transmission-Based Precautions: No last visito Patient Has Alerts: No Hospitalized since last visit: No Implantable device outside of the clinic No excluding cellular tissue based products placed in the center since last visit: Has Dressing in Place as Prescribed: Yes Has Compression in Place as Prescribed: Yes Has Footwear/Offloading in Place as Yes Prescribed: Left: Surgical Shoe with Pressure Relief Insole Pain Present Now: Yes Electronic Signature(s) Signed: 05/15/2021 9:07:11 AM By: [name] Entered By: [name] on 05/15/2021 09:07:10 -------------------------------------------------------------------------------- Compression Therapy Details Patient Name: Date of Service: [name] 05/15/2021 8:30 A M Medical Record Number: [id number] Patient Account Number: [id number] Date of Birth/Sex: Treating RN: [birth year] (45 y.o. [name] Primary Care Jared Cahn: [name] Other Clinician: Referring Gottlieb Zuercher: Treating Monda Chastain/Extender: Darleen Crocker in Treatment: 14 Compression Therapy Performed for Wound Assessment: Wound #1 Left Ankle Performed By: Clinician [name], RN Compression Type: Three Layer Post Procedure Diagnosis Same as Pre-procedure Electronic Signature(s) Signed: 05/15/2021 12:50:57 PM By: [name] Entered By: [name] on 05/15/2021 09:24:03 -------------------------------------------------------------------------------- Encounter Discharge Information Details Patient Name: Date of Service: [name] 05/15/2021 8:30 A M Medical Record Number: [id number] Patient Account Number: [id number] Date of Birth/Sex: Treating RN: January 24, 1976 (45 y.o. [name] Primary Care Graylyn Bunney: [name] Other Clinician: Referring Atley Neubert: Treating Taaj Hurlbut/Extender: Darleen Crocker in Treatment: 14 Encounter Discharge Information Items Discharge Condition: Stable Ambulatory Status: Crutches Discharge Destination: Home Transportation: Private Auto Schedule Follow-up Appointment: Yes Clinical Summary of Care: Provided on 05/15/2021 Form Type Recipient Paper Patient Patient Electronic Signature(s) Signed: 05/15/2021 12:50:57 PM By: [name] Entered By: [name] on 05/15/2021 09:41:55 -------------------------------------------------------------------------------- Lower Extremity Assessment Details Patient Name: Date of Service: [name] 05/15/2021 8:30 A M Medical Record Number: [id number] Patient Account Number: [id number] Date of Birth/Sex: Treating RN: [birth year] (45 y.o. [name] Primary Care Chael Urenda: [name] Other Clinician: Referring Hyun Reali: Treating Vernis Eid/Extender: Darleen Crocker in Treatment: 14 Edema Assessment Assessed: [name]: Yes] [name]: No] Edema: [Left: N] [Right: o] Calf Left: Right: Point of Measurement: 28 cm From Medial Instep 30.8 cm Ankle Left:  Right: Point of Measurement: 8 cm From Medial Instep 20.7 cm Vascular Assessment Pulses: Dorsalis Pedis Palpable: [Left:Yes] Electronic Signature(s) Signed: 05/15/2021 12:50:57 PM By: [name] Entered By: [name] on 05/15/2021 09:00:48 -------------------------------------------------------------------------------- Multi Wound Chart Details Patient Name: Date of Service: [name] 05/15/2021 8:30 A M Medical Record Number: [id number] Patient Account Number: [id number] Date of Birth/Sex: Treating RN: [birth year] (45 y.o. [name] Primary Care Braeley Buskey: [name] Other Clinician: Referring Lochlan Grygiel: Treating Lailana Shira/Extender: Darleen Crocker in Treatment: 14 Vital Signs Height(in): Pulse(bpm): 63 Weight(lbs): Blood Pressure(mmHg): 113/66 Body Mass Index(BMI):  Temperature(F): 97.5 Respiratory Rate(breaths/min): 18 Photos: [N/A:N/A] Left Ankle N/A N/A Wound Location: Trauma N/A N/A Wounding Event: Trauma, Other N/A N/A Primary Etiology: 10/14/2020 N/A N/A Date Acquired: 14 N/A N/A Weeks of Treatment: Open N/A N/A Wound Status: 3.2x8x0.2 N/A N/A Measurements L x W x D (cm) 20.106 N/A N/A A (cm) : rea 4.021 N/A N/A Volume (cm) : 67.30% N/A N/A % Reduction in A rea: 83.70% N/A N/A % Reduction in Volume: Full Thickness Without Exposed N/A N/A Classification: Support Structures Medium N/A N/A Exudate Amount: Serosanguineous N/A N/A Exudate Type: red, brown N/A N/A Exudate Color: Yes N/A N/A Foul Odor A Cleansing: fter No N/A N/A Odor Anticipated Due to Product Use: Distinct, outline attached N/A N/A Wound Margin: Large (67-100%) N/A N/A Granulation A mount: Red, Pink N/A N/A Granulation Quality: Small (1-33%) N/A N/A Necrotic Amount: Fat Layer (Subcutaneous Tissue): Yes N/A N/A Exposed Structures: Fascia: No Tendon: No Muscle: No Joint: No Bone: No Medium (34-66%) N/A  N/A Epithelialization: maceration noted N/A N/A Assessment Notes: Compression Therapy N/A N/A Procedures Performed: Treatment Notes Electronic Signature(s) Signed: 05/15/2021 12:39:46 PM By: Baltazar Najjar MD Signed: 05/18/2021 5:48:05 PM By: [name], BSN Entered By: Baltazar Najjar on 05/15/2021 09:37:53 -------------------------------------------------------------------------------- Multi-Disciplinary Care Plan Details Patient Name: Date of Service: [name] 05/15/2021 8:30 A M Medical Record Number: [id number] Patient Account Number: [id number] Date of Birth/Sex: Treating RN: 24-Jan-1976 (45 y.o. [name] Primary Care Isma Tietje: [name] Other Clinician: Referring Edla Para: Treating Akaylah Lalley/Extender: Darleen Crocker in Treatment: 14 Active Inactive Wound/Skin Impairment Nursing Diagnoses: Knowledge deficit related to ulceration/compromised skin integrity Goals: Patient/caregiver will verbalize understanding of skin care regimen Date Initiated: 02/05/2021 Target Resolution Date: 05/19/2021 Goal Status: Active Interventions: Assess patient/caregiver ability to obtain necessary supplies Assess patient/caregiver ability to perform ulcer/skin care regimen upon admission and as needed Provide education on ulcer and skin care Treatment Activities: Skin care regimen initiated : 02/05/2021 Topical wound management initiated : 02/05/2021 Notes: 03/31/21: Wound care regimen ongoing, target date extended. 04/21/21: Wound care ongoing, through interpreter patient states he is doing fine with his dressing changes. Electronic Signature(s) Signed: 05/15/2021 12:50:57 PM By: [name] Entered By: [name] on 05/15/2021 09:03:45 -------------------------------------------------------------------------------- Pain Assessment Details Patient Name: Date of Service: [name] 05/15/2021 8:30 A M Medical Record  Number: [id number] Patient Account Number: [id number] Date of Birth/Sex: Treating RN: 18-Dec-1975 (45 y.o. [name] Primary Care Joshawn Crissman: [name] Other Clinician: Referring Brittanee Ghazarian: Treating Caridad Silveira/Extender: Darleen Crocker in Treatment: 14 Active Problems Location of Pain Severity and Description of Pain Patient Has Paino Patient Unable to Respond Site Locations With Dressing Change: Yes Duration of the Pain. Constant / Intermittento Intermittent Rate the pain. Current Pain Level: 4 Character of Pain Describe the Pain: Aching, Burning Pain Management and Medication Current Pain Management: Medication: No Cold Application: No Rest: Yes Massage: No Activity: No T.E.N.S.: No Heat Application: No Leg drop or elevation: No Is the Current Pain Management Adequate: Adequate How does your wound impact your activities of daily livingo Sleep: No Bathing: No Appetite: No Relationship With Others: No Bladder Continence: No Emotions: No Bowel Continence: No Work: No Toileting: No Drive: No Dressing: No Hobbies: No Electronic Signature(s) Signed: 05/15/2021 12:50:57 PM By: [name] Entered By: [name] on 05/15/2021 08:53:36 -------------------------------------------------------------------------------- Patient/Caregiver Education Details Patient Name: Date of Service: [name] 10/14/2022andnbsp8:30 A M Medical Record Number: [id number] Patient Account Number: [id number]  Date of Birth/Gender: Treating RN: June 25, 1976 (45 y.o. [name] Primary Care Physician: [name] Other Clinician: Referring Physician: Treating Physician/Extender: Darleen Crocker in Treatment: 14 Education Assessment Education Provided To: Patient Education Topics Provided Venous: Methods: Explain/Verbal, Printed Responses: State content correctly Wound/Skin Impairment: Methods: Explain/Verbal,  Printed Responses: State content correctly Electronic Signature(s) Signed: 05/15/2021 12:50:57 PM By: [name] Entered By: [name] on 05/15/2021 09:04:13 -------------------------------------------------------------------------------- Wound Assessment Details Patient Name: Date of Service: [name] 05/15/2021 8:30 A M Medical Record Number: [id number] Patient Account Number: [id number] Date of Birth/Sex: Treating RN: [birth year] (45 y.o. [name] Primary Care Sheldon Amara: [name] Other Clinician: Referring Estalee Mccandlish: Treating Cristi Gwynn/Extender: Darleen Crocker in Treatment: 14 Wound Status Wound Number: 1 Primary Etiology: Trauma, Other Wound Location: Left Ankle Wound Status: Open Wounding Event: Trauma Date Acquired: 10/14/2020 Weeks Of Treatment: 14 Clustered Wound: No Photos Wound Measurements Length: (cm) 3.2 Width: (cm) 8 Depth: (cm) 0.2 Area: (cm) 20.106 Volume: (cm) 4.021 % Reduction in Area: 67.3% % Reduction in Volume: 83.7% Epithelialization: Medium (34-66%) Tunneling: No Undermining: No Wound Description Classification: Full Thickness Without Exposed Support Structures Wound Margin: Distinct, outline attached Exudate Amount: Medium Exudate Type: Serosanguineous Exudate Color: red, brown Foul Odor After Cleansing: Yes Due to Product Use: No Slough/Fibrino Yes Wound Bed Granulation Amount: Large (67-100%) Exposed Structure Granulation Quality: Red, Pink Fascia Exposed: No Necrotic Amount: Small (1-33%) Fat Layer (Subcutaneous Tissue) Exposed: Yes Necrotic Quality: Adherent Slough Tendon Exposed: No Muscle Exposed: No Joint Exposed: No Bone Exposed: No Assessment Notes maceration noted Treatment Notes Wound #1 (Ankle) Wound Laterality: Left Cleanser Soap and Water Discharge Instruction: May shower and wash wound with dial antibacterial soap and water prior to dressing change. Wound  Cleanser Discharge Instruction: Cleanse the wound with wound cleanser prior to applying a clean dressing using gauze sponges, not tissue or cotton balls. Peri-Wound Care Triamcinolone 15 (g) Discharge Instruction: T wound and leg o Zinc Oxide Ointment 30g tube Discharge Instruction: Apply Zinc Oxide to periwound with each dressing change Sween Lotion (Moisturizing lotion) Discharge Instruction: Apply moisturizing lotion as directed Topical Primary Dressing KerraCel Ag Gelling Fiber Dressing, 4x5 in (silver alginate) Discharge Instruction: Apply silver alginate to wound bed as instructed Secondary Dressing Zetuvit Plus 4x8 in Discharge Instruction: Apply over primary dressing as directed. CarboFLEX Odor Control Dressing, 4x4 in Discharge Instruction: Apply over primary dressing as directed. Secured With Compression Wrap ThreePress (3 layer compression wrap) Discharge Instruction: Apply three layer compression as directed. Compression Stockings Add-Ons Electronic Signature(s) Signed: 05/15/2021 12:50:57 PM By: [name] Entered By: [name] on 05/15/2021 09:22:19 -------------------------------------------------------------------------------- Vitals Details Patient Name: Date of Service: [name], [name] 05/15/2021 8:30 A M Medical Record Number: [id number] Patient Account Number: [id number] Date of Birth/Sex: Treating RN: 08-16-1975 (45 y.o. [name] Primary Care Tanessa Tidd: [name] Other Clinician: Referring Aundria Bitterman: Treating Padraig Nhan/Extender: Darleen Crocker in Treatment: 14 Vital Signs Time Taken: 08:52 Temperature (F): 97.5 Pulse (bpm): 63 Respiratory Rate (breaths/min): 18 Blood Pressure (mmHg): 113/66 Reference Range: 80 - 120 mg / dl Electronic Signature(s) Signed: 05/15/2021 12:50:57 PM By: [name] Entered By: [name] on 05/15/2021 08:53:11

## 2021-05-18 NOTE — Assessment & Plan Note (Signed)
Unclear if infectious etiology but has been on treatment for 3 months for a possible infection and overall has improved.  At this time, will stop the antibiotics and he can continue with wound care treatments and follow up as needed.

## 2021-05-18 NOTE — Assessment & Plan Note (Signed)
Will check the BMP/creat today on Bactrim to assure no issues.

## 2021-05-18 NOTE — Assessment & Plan Note (Signed)
Discussed cessation for healing

## 2021-05-19 LAB — BASIC METABOLIC PANEL
BUN: 14 mg/dL (ref 7–25)
CO2: 20 mmol/L (ref 20–32)
Calcium: 9.2 mg/dL (ref 8.6–10.3)
Chloride: 110 mmol/L (ref 98–110)
Creat: 1.24 mg/dL (ref 0.60–1.29)
Glucose, Bld: 87 mg/dL (ref 65–99)
Potassium: 4 mmol/L (ref 3.5–5.3)
Sodium: 138 mmol/L (ref 135–146)

## 2021-05-21 ENCOUNTER — Other Ambulatory Visit: Payer: Self-pay

## 2021-05-21 ENCOUNTER — Encounter (HOSPITAL_BASED_OUTPATIENT_CLINIC_OR_DEPARTMENT_OTHER): Payer: Medicaid Other | Admitting: Internal Medicine

## 2021-05-21 DIAGNOSIS — L97322 Non-pressure chronic ulcer of left ankle with fat layer exposed: Secondary | ICD-10-CM | POA: Diagnosis not present

## 2021-05-21 DIAGNOSIS — F17200 Nicotine dependence, unspecified, uncomplicated: Secondary | ICD-10-CM | POA: Diagnosis not present

## 2021-05-21 DIAGNOSIS — I87332 Chronic venous hypertension (idiopathic) with ulcer and inflammation of left lower extremity: Secondary | ICD-10-CM | POA: Diagnosis not present

## 2021-05-21 NOTE — Progress Notes (Signed)
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 curette to remove fibrinous debris from the surface subcutaneous tissue hemostasis with direct pressure. He tolerates this marginally.No evidence of surrounding infection Integumentary (Hair, Skin) Wound #1 status is Open. Original cause of wound was Trauma. The date acquired was: 10/14/2020. The wound has been in treatment 15 weeks. The wound is located on the Left Ankle. The wound measures 3.2cm length x 7.8cm width x 0.2cm depth; 19.604cm^2 area and 3.921cm^3 volume. There is Fat Layer (Subcutaneous Tissue) exposed. There is no tunneling or undermining noted. There is a medium amount of purulent drainage noted. The wound margin is distinct with the outline attached to the wound base. There is medium (34-66%) red, pink granulation within the wound bed. There is a medium (34-66%) amount of necrotic tissue within the wound bed including Adherent Slough. 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 There was a Excisional Skin/Subcutaneous Tissue Debridement with a total area of 24.96 sq cm performed by [name]., MD. With the following instrument(s): Curette to remove Viable and Non-Viable tissue/material. Material removed includes Subcutaneous Tissue, Slough, Skin: Dermis, Skin: Epidermis, and Fibrin/Exudate after achieving pain control using Other (Benzocaine 20%). A time out was conducted at 10:10, prior to the start of the procedure. A Moderate amount of bleeding was controlled with Pressure. The procedure was tolerated well with a pain level of 0 throughout and a pain level of 3 following the procedure. Post Debridement Measurements: 3.2cm length x 7.8cm width x 0.2cm depth; 3.921cm^3 volume. Character of Wound/Ulcer Post Debridement is improved. Post procedure Diagnosis Wound #1: Same as Pre-Procedure Pre-procedure diagnosis of Wound #1 is a Trauma, Other located on the Left Ankle . There was a Three Layer Compression Therapy Procedure by Elesa Hacker,  Yvonne Kendall, Charity fundraiser. Post procedure Diagnosis Wound #1: Same as Pre-Procedure Plan Follow-up Appointments: Return Appointment in 1 week. - with Dr. Leanord Hawking - Interpreter Required Bathing/ Shower/ Hygiene: May shower with protection but do not get wound dressing(s) wet. Edema Control - Lymphedema / SCD / Other: Elevate legs to the level of the heart or above for 30 minutes daily and/or when sitting, a frequency of: - 3-4 times a day throughout the day. Avoid standing for long periods of time. Exercise regularly Compression stocking or Garment 20-30 mm/Hg pressure to: - Pt. to order from [employer]. We will give leg measurements!! Off-Loading: Open toe surgical shoe to: - left foot WOUND #1: - Ankle Wound Laterality: Left Cleanser: Soap and Water 1 x Per  Week/ Discharge Instructions: May shower and wash wound with dial antibacterial soap and water prior to dressing change. Cleanser: Wound Cleanser 1 x Per Week/ Discharge Instructions: Cleanse the wound with wound cleanser prior to applying a clean dressing using gauze sponges, not tissue or cotton balls. Peri-Wound Care: Triamcinolone 15 (g) 1 x Per Week/ Discharge Instructions: T wound and leg o Peri-Wound Care: Zinc Oxide Ointment 30g tube 1 x Per Week/ Discharge Instructions: Apply Zinc Oxide to periwound with each dressing change Peri-Wound Care: Sween Lotion (Moisturizing lotion) 1 x Per Week/ Discharge Instructions: Apply moisturizing lotion as directed Prim Dressing: KerraCel Ag Gelling Fiber Dressing, 4x5 in (silver alginate) 1 x Per Week/ ary Discharge Instructions: Apply silver alginate to wound bed as instructed Secondary Dressing: Zetuvit Plus 4x8 in 1 x Per Week/ Discharge Instructions: Apply over primary dressing as directed. Secondary Dressing: CarboFLEX Odor Control Dressing, 4x4 in 1 x Per Week/ Discharge Instructions: Apply over primary dressing as directed. Com pression Wrap: ThreePress (3 layer compression wrap) 1 x Per Week/ Discharge Instructions: Apply three layer compression as directed. 1. #1 we are continuing with Aquacel Ag [name] to bed and CarboFlex zinc oxide to the area on the lateral foot which is probably paretic because of drainage 2. No overt infection Electronic Signature(s) Signed: 05/21/2021 5:12:42 PM By: [name] Najjar MD Entered By: [name] on 05/21/2021 10:54:53 -------------------------------------------------------------------------------- SuperBill Details Patient Name: Date of Service: [name] 05/21/2021 Medical Record Number: [id number] Patient Account Number: [id number] Date of Birth/Sex: Treating RN: 06-23-1976 (45 y.o. [name] Primary Care Provider: Shelby [name] Other Clinician: Referring  Provider: Treating Provider/Extender: [name] in Treatment: 15 Diagnosis Coding ICD-10 Codes Code Description [phone] Non-pressure chronic ulcer of left ankle with other specified severity I87.332 Chronic venous hypertension (idiopathic) with ulcer and inflammation of left lower extremity Facility Procedures CPT4 Code: [id number] Description: 11042 - DEB SUBQ TISSUE 20 SQ CM/< ICD-10 Diagnosis Description L97.328 Non-pressure chronic ulcer of left ankle with other specified severity Modifier: Quantity: 1 CPT4 Code: 84696295 Description: 11045 - DEB SUBQ TISS EA ADDL 20CM ICD-10 Diagnosis Description L97.328 Non-pressure chronic ulcer of left ankle with other specified severity Modifier: Quantity: 1 Physician Procedures : CPT4 Code Description Modifier [id number] 11042 - WC PHYS SUBQ TISS 20 SQ CM ICD-10 Diagnosis Description L97.328 Non-pressure chronic ulcer of left ankle with other specified severity Quantity: 1 : [id number] 11045 - WC PHYS SUBQ TISS EA ADDL 20 CM ICD-10 Diagnosis Description L97.328 Non-pressure chronic ulcer of left ankle with other specified severity Quantity: 1 Electronic Signature(s) Signed: 05/21/2021 5:12:42 PM By: [name] Najjar MD Entered By: [name] on 05/21/2021 10:55:04

## 2021-05-21 NOTE — Progress Notes (Signed)
[name], [name] ([id number]) Visit Report for 05/21/2021 Arrival Information Details Patient Name: Date of Service: [name] [place] NIE 05/21/2021 9:15 A M Medical Record Number: [id number] Patient Account Number: [id number] Date of Birth/Sex: Treating RN: July 13, 1976 (45 y.o. [name] Primary Care [name]: [name] Other Clinician: Referring Mikaeel Petrow: Treating Dorissa Stinnette/Extender: Darleen Crocker in Treatment: 15 Visit Information History Since Last Visit Added or deleted any medications: No Patient Arrived: Crutches Any new allergies or adverse reactions: No Arrival Time: 09:44 Had a fall or experienced change in No Accompanied By: translater activities of daily living that may affect Transfer Assistance: None risk of falls: Patient Identification Verified: Yes Signs or symptoms of abuse/neglect since No Secondary Verification Process Completed: Yes last visito Patient Requires Transmission-Based Precautions: No Hospitalized since last visit: No Patient Has Alerts: No Implantable device outside of the clinic No excluding cellular tissue based products placed in the center since last visit: Has Dressing in Place as Prescribed: Yes Has Compression in Place as Prescribed: Yes Has Footwear/Offloading in Place as Yes Prescribed: Left: Surgical Shoe with Pressure Relief Insole Pain Present Now: No Electronic Signature(s) Signed: 05/21/2021 5:40:08 PM By: Shawn Stall RN, BSN Entered By: Shawn Stall on 05/21/2021 09:48:14 -------------------------------------------------------------------------------- Compression Therapy Details Patient Name: Date of Service: [name] 05/21/2021 9:15 A M Medical Record Number: [id number] Patient Account Number: [id number] Date of Birth/Sex: Treating RN: October 20, 1975 (45 y.o. [name] Primary Care [name]: [name] Other Clinician: Referring [name]: Treating  [name]/Extender: Darleen Crocker in Treatment: 15 Compression Therapy Performed for Wound Assessment: Wound #1 Left Ankle Performed By: Clinician Shawn Stall, RN Compression Type: Three Layer Post Procedure Diagnosis Same as Pre-procedure Electronic Signature(s) Signed: 05/21/2021 5:40:08 PM By: Shawn Stall RN, BSN Entered By: Shawn Stall on 05/21/2021 10:21:27 -------------------------------------------------------------------------------- Encounter Discharge Information Details Patient Name: Date of Service: [name] 05/21/2021 9:15 A M Medical Record Number: [id number] Patient Account Number: [id number] Date of Birth/Sex: Treating RN: Jan 14, 1976 (45 y.o. [name] Primary Care Avion Patella: [name] Other Clinician: Referring [name]: Treating [name]/Extender: Darleen Crocker in Treatment: 15 Encounter Discharge Information Items Post Procedure Vitals Discharge Condition: Stable Temperature (F): 97.4 Ambulatory Status: Crutches Pulse (bpm): 54 Discharge Destination: Home Respiratory Rate (breaths/min): 16 Transportation: Private Auto Blood Pressure (mmHg): 111/59 Accompanied By: self Schedule Follow-up Appointment: Yes Clinical Summary of Care: Electronic Signature(s) Signed: 05/21/2021 5:40:08 PM By: Shawn Stall RN, BSN Entered By: Shawn Stall on 05/21/2021 10:23:16 -------------------------------------------------------------------------------- Lower Extremity Assessment Details Patient Name: Date of Service: [name] 05/21/2021 9:15 A M Medical Record Number: [id number] Patient Account Number: [id number] Date of Birth/Sex: Treating RN: 1975/10/26 (45 y.o. [name] Primary Care [name]: [name] Other Clinician: Referring [name]: Treating [name]/Extender: Darleen Crocker in Treatment: 15 Edema Assessment Assessed: [name]: Yes]  [name]: No] Edema: [Left: N] [Right: o] Calf Left: Right: Point of Measurement: 28 cm From Medial Instep 30 cm Ankle Left: Right: Point of Measurement: 8 cm From Medial Instep 22.5 cm Vascular Assessment Pulses: Dorsalis Pedis Palpable: [Left:Yes] Electronic Signature(s) Signed: 05/21/2021 5:40:08 PM By: Shawn Stall RN, BSN Entered By: Shawn Stall on 05/21/2021 09:59:42 -------------------------------------------------------------------------------- Multi Wound Chart Details Patient Name: Date of Service: [name] 05/21/2021 9:15 A M Medical Record Number: [id number] Patient Account Number: [id number] Date of Birth/Sex: Treating RN: [birth year] (45 y.o. [name] Primary Care [name]: [name] Other Clinician: Referring [name]:  Treating [name]/Extender: Darleen Crocker in Treatment: 15 Vital Signs Height(in): Pulse(bpm): 54 Weight(lbs): Blood Pressure(mmHg): 111/59 Body Mass Index(BMI): Temperature(F): 97.4 Respiratory Rate(breaths/min): 16 Photos: [N/A:N/A] Left Ankle N/A N/A Wound Location: Trauma N/A N/A Wounding Event: Trauma, Other N/A N/A Primary Etiology: 10/14/2020 N/A N/A Date Acquired: 15 N/A N/A Weeks of Treatment: Open N/A N/A Wound Status: 3.2x7.8x0.2 N/A N/A Measurements L x W x D (cm) 19.604 N/A N/A A (cm) : rea 3.921 N/A N/A Volume (cm) : 68.20% N/A N/A % Reduction in A rea: 84.10% N/A N/A % Reduction in Volume: Full Thickness Without Exposed N/A N/A Classification: Support Structures Medium N/A N/A Exudate A mount: Purulent N/A N/A Exudate Type: yellow, brown, green N/A N/A Exudate Color: Distinct, outline attached N/A N/A Wound Margin: Medium (34-66%) N/A N/A Granulation A mount: Red, Pink N/A N/A Granulation Quality: Medium (34-66%) N/A N/A Necrotic A mount: Fat Layer (Subcutaneous Tissue): Yes N/A N/A Exposed Structures: Fascia: No Tendon: No Muscle:  No Joint: No Bone: No Medium (34-66%) N/A N/A Epithelialization: Debridement - Excisional N/A N/A Debridement: Pre-procedure Verification/Time Out 10:10 N/A N/A Taken: Other N/A N/A Pain Control: Subcutaneous, Slough N/A N/A Tissue Debrided: Skin/Subcutaneous Tissue N/A N/A Level: 24.96 N/A N/A Debridement A (sq cm): rea Curette N/A N/A Instrument: Moderate N/A N/A Bleeding: Pressure N/A N/A Hemostasis A chieved: 0 N/A N/A Procedural Pain: 3 N/A N/A Post Procedural Pain: Procedure was tolerated well N/A N/A Debridement Treatment Response: 3.2x7.8x0.2 N/A N/A Post Debridement Measurements L x W x D (cm) 3.921 N/A N/A Post Debridement Volume: (cm) Compression Therapy N/A N/A Procedures Performed: Debridement Treatment Notes Wound #1 (Ankle) Wound Laterality: Left Cleanser Soap and Water Discharge Instruction: May shower and wash wound with dial antibacterial soap and water prior to dressing change. Wound Cleanser Discharge Instruction: Cleanse the wound with wound cleanser prior to applying a clean dressing using gauze sponges, not tissue or cotton balls. Peri-Wound Care Triamcinolone 15 (g) Discharge Instruction: T wound and leg o Zinc Oxide Ointment 30g tube Discharge Instruction: Apply Zinc Oxide to periwound with each dressing change Sween Lotion (Moisturizing lotion) Discharge Instruction: Apply moisturizing lotion as directed Topical Primary Dressing KerraCel Ag Gelling Fiber Dressing, 4x5 in (silver alginate) Discharge Instruction: Apply silver alginate to wound bed as instructed Secondary Dressing Zetuvit Plus 4x8 in Discharge Instruction: Apply over primary dressing as directed. CarboFLEX Odor Control Dressing, 4x4 in Discharge Instruction: Apply over primary dressing as directed. Secured With Compression Wrap ThreePress (3 layer compression wrap) Discharge Instruction: Apply three layer compression as directed. Compression  Stockings Add-Ons Electronic Signature(s) Signed: 05/21/2021 5:12:42 PM By: Baltazar Najjar MD Signed: 05/21/2021 5:40:08 PM By: Shawn Stall RN, BSN Entered By: Baltazar Najjar on 05/21/2021 10:51:18 -------------------------------------------------------------------------------- Multi-Disciplinary Care Plan Details Patient Name: Date of Service: [name] 05/21/2021 9:15 A M Medical Record Number: [id number] Patient Account Number: [id number] Date of Birth/Sex: Treating RN: 23-Aug-1975 (45 y.o. [name] Primary Care Sonji Starkes: [name] Other Clinician: Referring Shannon Balthazar: Treating Kerilyn Cortner/Extender: Darleen Crocker in Treatment: 15 Active Inactive Wound/Skin Impairment Nursing Diagnoses: Knowledge deficit related to ulceration/compromised skin integrity Goals: Patient/caregiver will verbalize understanding of skin care regimen Date Initiated: 02/05/2021 Target Resolution Date: 06/25/2021 Goal Status: Active Interventions: Assess patient/caregiver ability to obtain necessary supplies Assess patient/caregiver ability to perform ulcer/skin care regimen upon admission and as needed Provide education on ulcer and skin care Treatment Activities: Skin care regimen initiated : 02/05/2021 Topical wound management initiated : 02/05/2021 Notes: 03/31/21: Wound care  regimen ongoing, target date extended. 04/21/21: Wound care ongoing, through interpreter patient states he is doing fine with his dressing changes. Electronic Signature(s) Signed: 05/21/2021 5:40:08 PM By: Shawn Stall RN, BSN Entered By: Shawn Stall on 05/21/2021 10:19:48 -------------------------------------------------------------------------------- Pain Assessment Details Patient Name: Date of Service: [name] 05/21/2021 9:15 A M Medical Record Number: [id number] Patient Account Number: [id number] Date of Birth/Sex: Treating RN: [birth year] (45 y.o. [name] Primary Care Chanell Nadeau: [name] Other Clinician: Referring Raffaele Derise: Treating Sakina Briones/Extender: Darleen Crocker in Treatment: 15 Active Problems Location of Pain Severity and Description of Pain Patient Has Paino No Site Locations Rate the pain. Current Pain Level: 0 Pain Management and Medication Current Pain Management: Medication: No Cold Application: No Rest: No Massage: No Activity: No T.E.N.S.: No Heat Application: No Leg drop or elevation: No Is the Current Pain Management Adequate: Adequate How does your wound impact your activities of daily livingo Sleep: No Bathing: No Appetite: No Relationship With Others: No Bladder Continence: No Emotions: No Bowel Continence: No Work: No Toileting: No Drive: No Dressing: No Hobbies: No Psychologist, prison and probation services) Signed: 05/21/2021 5:40:08 PM By: Shawn Stall RN, BSN Entered By: Shawn Stall on 05/21/2021 09:48:38 -------------------------------------------------------------------------------- Patient/Caregiver Education Details Patient Name: Date of Service: [name] 10/20/2022andnbsp9:15 A M Medical Record Number: [id number] Patient Account Number: [id number] Date of Birth/Gender: Treating RN: Dec 12, 1975 (45 y.o. [name] Primary Care Physician: [name] Other Clinician: Referring Physician: Treating Physician/Extender: Darleen Crocker in Treatment: 15 Education Assessment Education Provided To: Patient Education Topics Provided Wound/Skin Impairment: Handouts: Skin Care Do's and Dont's Methods: Explain/Verbal Responses: Reinforcements needed Electronic Signature(s) Signed: 05/21/2021 5:40:08 PM By: Shawn Stall RN, BSN Entered By: Shawn Stall on 05/21/2021 10:20:04 -------------------------------------------------------------------------------- Wound Assessment Details Patient Name: Date of Service: [name] NA  NIE 05/21/2021 9:15 A M Medical Record Number: [id number] Patient Account Number: [id number] Date of Birth/Sex: Treating RN: Jun 16, 1976 (45 y.o. Harlon Flor, [place] Primary Care Edel Rivero: [name] Other Clinician: Referring Artice Bergerson: Treating Leldon Steege/Extender: Darleen Crocker in Treatment: 15 Wound Status Wound Number: 1 Primary Etiology: Trauma, Other Wound Location: Left Ankle Wound Status: Open Wounding Event: Trauma Date Acquired: 10/14/2020 Weeks Of Treatment: 15 Clustered Wound: No Photos Wound Measurements Length: (cm) 3.2 Width: (cm) 7.8 Depth: (cm) 0.2 Area: (cm) 19.604 Volume: (cm) 3.921 % Reduction in Area: 68.2% % Reduction in Volume: 84.1% Epithelialization: Medium (34-66%) Tunneling: No Undermining: No Wound Description Classification: Full Thickness Without Exposed Support Structures Wound Margin: Distinct, outline attached Exudate Amount: Medium Exudate Type: Purulent Exudate Color: yellow, brown, green Foul Odor After Cleansing: No Slough/Fibrino Yes Wound Bed Granulation Amount: Medium (34-66%) Exposed Structure Granulation Quality: Red, Pink Fascia Exposed: No Necrotic Amount: Medium (34-66%) Fat Layer (Subcutaneous Tissue) Exposed: Yes Necrotic Quality: Adherent Slough Tendon Exposed: No Muscle Exposed: No Joint Exposed: No Bone Exposed: No Treatment Notes Wound #1 (Ankle) Wound Laterality: Left Cleanser Soap and Water Discharge Instruction: May shower and wash wound with dial antibacterial soap and water prior to dressing change. Wound Cleanser Discharge Instruction: Cleanse the wound with wound cleanser prior to applying a clean dressing using gauze sponges, not tissue or cotton balls. Peri-Wound Care Triamcinolone 15 (g) Discharge Instruction: T wound and leg o Zinc Oxide Ointment 30g tube Discharge Instruction: Apply Zinc Oxide to periwound with each dressing change Sween Lotion (Moisturizing  lotion) Discharge Instruction: Apply moisturizing lotion as directed Topical Primary Dressing KerraCel Ag Gelling Fiber Dressing,  4x5 in (silver alginate) Discharge Instruction: Apply silver alginate to wound bed as instructed Secondary Dressing Zetuvit Plus 4x8 in Discharge Instruction: Apply over primary dressing as directed. CarboFLEX Odor Control Dressing, 4x4 in Discharge Instruction: Apply over primary dressing as directed. Secured With Compression Wrap ThreePress (3 layer compression wrap) Discharge Instruction: Apply three layer compression as directed. Compression Stockings Add-Ons Electronic Signature(s) Signed: 05/21/2021 5:40:08 PM By: Shawn Stall RN, BSN Entered By: Shawn Stall on 05/21/2021 09:58:08 -------------------------------------------------------------------------------- Vitals Details Patient Name: Date of Service: [name] 05/21/2021 9:15 A M Medical Record Number: [id number] Patient Account Number: [id number] Date of Birth/Sex: Treating RN: 07-12-1976 (45 y.o. [name] Primary Care Braidyn Scorsone: [name] Other Clinician: Referring Carriann Hesse: Treating Iaan Oregel/Extender: Darleen Crocker in Treatment: 15 Vital Signs Time Taken: 09:45 Temperature (F): 97.4 Pulse (bpm): 54 Respiratory Rate (breaths/min): 16 Blood Pressure (mmHg): 111/59 Reference Range: 80 - 120 mg / dl Electronic Signature(s) Signed: 05/21/2021 5:40:08 PM By: Shawn Stall RN, BSN Entered By: Shawn Stall on 05/21/2021 09:49:23

## 2021-05-27 ENCOUNTER — Encounter (HOSPITAL_BASED_OUTPATIENT_CLINIC_OR_DEPARTMENT_OTHER): Payer: Medicaid Other | Admitting: Internal Medicine

## 2021-05-28 ENCOUNTER — Encounter (HOSPITAL_BASED_OUTPATIENT_CLINIC_OR_DEPARTMENT_OTHER): Payer: Medicaid Other | Admitting: Internal Medicine

## 2021-05-28 ENCOUNTER — Other Ambulatory Visit: Payer: Self-pay

## 2021-05-28 DIAGNOSIS — I87332 Chronic venous hypertension (idiopathic) with ulcer and inflammation of left lower extremity: Secondary | ICD-10-CM | POA: Diagnosis not present

## 2021-05-28 DIAGNOSIS — F17200 Nicotine dependence, unspecified, uncomplicated: Secondary | ICD-10-CM | POA: Diagnosis not present

## 2021-05-28 DIAGNOSIS — L97822 Non-pressure chronic ulcer of other part of left lower leg with fat layer exposed: Secondary | ICD-10-CM | POA: Diagnosis not present

## 2021-05-28 DIAGNOSIS — L97322 Non-pressure chronic ulcer of left ankle with fat layer exposed: Secondary | ICD-10-CM | POA: Diagnosis not present

## 2021-05-28 NOTE — Progress Notes (Signed)
[name], [name] ([id number]) Visit Report for 05/28/2021 Arrival Information Details Patient Name: Date of Service: [name] [place] NIE 05/28/2021 12:45 PM Medical Record Number: [id number] Patient Account Number: [id number] Date of Birth/Sex: Treating RN: Apr 05, 1976 (45 y.o. [name], Millard.Loa Primary Care [name]: [name] Other Clinician: Referring [name]: Treating [name]/Extender: [name] in Treatment: 16 Visit Information History Since Last Visit Added or deleted any medications: No Patient Arrived: Crutches Any new allergies or adverse reactions: No Arrival Time: 12:58 Had a fall or experienced change in No Accompanied By: self activities of daily living that may affect Transfer Assistance: None risk of falls: Patient Identification Verified: Yes Signs or symptoms of abuse/neglect since last visito No Secondary Verification Process Completed: Yes Hospitalized since last visit: No Patient Requires Transmission-Based Precautions: No Implantable device outside of the clinic excluding No Patient Has Alerts: No cellular tissue based products placed in the center since last visit: Has Dressing in Place as Prescribed: Yes Has Compression in Place as Prescribed: Yes Pain Present Now: No Electronic Signature(s) Signed: 05/28/2021 5:43:20 PM By: [name] Stall RN, BSN Entered By: [name] on 05/28/2021 13:00:42 -------------------------------------------------------------------------------- Compression Therapy Details Patient Name: Date of Service: [name] 05/28/2021 12:45 PM Medical Record Number: [id number] Patient Account Number: [id number] Date of Birth/Sex: Treating RN: [birth year] (45 y.o. [name] Primary Care Walker Paddack: [name] Other Clinician: Referring [name]: Treating  [name]/Extender: [name] in Treatment: 16 Compression Therapy Performed for Wound  Assessment: Wound #1 Left Ankle Performed By: Clinician [name] Stall, RN Compression Type: Three Layer Post Procedure Diagnosis Same as Pre-procedure Electronic Signature(s) Signed: 05/28/2021 5:43:20 PM By: [name] Stall RN, BSN Entered By: [name] on 05/28/2021 16:46:03 -------------------------------------------------------------------------------- Encounter Discharge Information Details Patient Name: Date of Service: [name] 05/28/2021 12:45 PM Medical Record Number: [id number] Patient Account Number: [id number] Date of Birth/Sex: Treating RN: 16-Jun-1976 (45 y.o. [name] Primary Care Herberth Deharo: [name] Other Clinician: Referring Renton Berkley: Treating Eian Vandervelden/Extender: [name] in Treatment: 16 Encounter Discharge Information Items Discharge Condition: Stable Ambulatory Status: Crutches Discharge Destination: Home Transportation: Private Auto Accompanied By: self Schedule Follow-up Appointment: Yes Clinical Summary of Care: Electronic Signature(s) Signed: 05/28/2021 5:43:20 PM By: [name] Stall RN, BSN Entered By: [name] on 05/28/2021 16:53:56 -------------------------------------------------------------------------------- Lower Extremity Assessment Details Patient Name: Date of Service: [name] 05/28/2021 12:45 PM Medical Record Number: [id number] Patient Account Number: [id number] Date of Birth/Sex: Treating RN: 23-Jun-1976 (45 y.o. [name] Primary Care Vesper Trant: [name] Other Clinician: Referring Aryaan Persichetti: Treating Lailyn Appelbaum/Extender: [name] in Treatment: 16 Edema Assessment Assessed: [name]: Yes] [name]: No] Edema: [Left: N] [Right: o] Calf Left: Right: Point of Measurement: 28 cm From Medial Instep 31 cm Ankle Left: Right: Point of Measurement: 8 cm From Medial Instep 22 cm Vascular Assessment Pulses: Dorsalis Pedis Palpable:  [Left:Yes] Electronic Signature(s) Signed: 05/28/2021 5:43:20 PM By: [name] Stall RN, BSN Entered By: [name] on 05/28/2021 13:15:55 -------------------------------------------------------------------------------- Multi-Disciplinary Care Plan Details Patient Name: Date of Service: [name] 05/28/2021 12:45 PM Medical Record Number: [id number] Patient Account Number: [id number] Date of Birth/Sex: Treating RN: 1975-11-27 (45 y.o. [name] Primary Care Demitrius Crass: [name] Other Clinician: Referring Shellia Hartl: Treating Carrisa Keller/Extender: [name] in Treatment: 16 Active Inactive Wound/Skin Impairment Nursing Diagnoses: Knowledge deficit related to ulceration/compromised skin integrity Goals: Patient/caregiver will verbalize understanding of skin care regimen Date Initiated: 02/05/2021 Target  Resolution Date: 06/25/2021 Goal Status: Active Interventions: Assess patient/caregiver ability to obtain necessary supplies Assess patient/caregiver ability to perform ulcer/skin care regimen upon admission and as needed Provide education on ulcer and skin care Treatment Activities: Skin care regimen initiated : 02/05/2021 Topical wound management initiated : 02/05/2021 Notes: 03/31/21: Wound care regimen ongoing, target date extended. 04/21/21: Wound care ongoing, through interpreter patient states he is doing fine with his dressing changes. Electronic Signature(s) Signed: 05/28/2021 5:43:20 PM By: [name] Stall RN, BSN Entered By: [name] on 05/28/2021 13:16:26 -------------------------------------------------------------------------------- Pain Assessment Details Patient Name: Date of Service: [name] 05/28/2021 12:45 PM Medical Record Number: [id number] Patient Account Number: [id number] Date of Birth/Sex: Treating RN: Aug 14, 1975 (45 y.o. [name] Primary Care Donnajean Chesnut: [name] Other  Clinician: Referring Aemilia Dedrick: Treating Courteny Egler/Extender: [name] in Treatment: 16 Active Problems Location of Pain Severity and Description of Pain Patient Has Paino No Site Locations Rate the pain. Rate the pain. Current Pain Level: 0 Pain Management and Medication Current Pain Management: Medication: No Cold Application: No Rest: No Massage: No Activity: No T.E.N.S.: No Heat Application: No Leg drop or elevation: No Is the Current Pain Management Adequate: Adequate How does your wound impact your activities of daily livingo Sleep: No Bathing: No Appetite: No Relationship With Others: No Bladder Continence: No Emotions: No Bowel Continence: No Work: No Toileting: No Drive: No Dressing: No Hobbies: No Notes c/o itching to left lateral foot. Electronic Signature(s) Signed: 05/28/2021 5:43:20 PM By: [name] Stall RN, BSN Entered By: [name] on 05/28/2021 13:15:44 -------------------------------------------------------------------------------- Patient/Caregiver Education Details Patient Name: Date of Service: [name] 10/27/2022andnbsp12:45 PM Medical Record Number: [id number] Patient Account Number: [id number] Date of Birth/Gender: Treating RN: [birth year] (45 y.o. [name] Primary Care Physician: [name] Other Clinician: Referring Physician: Treating Physician/Extender: [name] in Treatment: 16 Education Assessment Education Provided To: Patient Education Topics Provided Wound/Skin Impairment: Handouts: Skin Care Do's and Dont's Methods: Explain/Verbal Responses: Reinforcements needed Electronic Signature(s) Signed: 05/28/2021 5:43:20 PM By: [name] Stall RN, BSN Entered By: [name] on 05/28/2021 13:16:36 -------------------------------------------------------------------------------- Wound Assessment Details Patient Name: Date of Service: [name]  [name] 05/28/2021 12:45 PM Medical Record Number: [id number] Patient Account Number: [id number] Date of Birth/Sex: Treating RN: 06-22-1976 (45 y.o. [name], Millard.Loa Primary Care Pranay Hilbun: [name] Other Clinician: Referring Raymonde Hamblin: Treating Kennedy Brines/Extender: [name] in Treatment: 16 Wound Status Wound Number: 1 Primary Etiology: Trauma, Other Wound Location: Left Ankle Wound Status: Open Wounding Event: Trauma Date Acquired: 10/14/2020 Weeks Of Treatment: 16 Clustered Wound: No Photos Wound Measurements Length: (cm) 3.8 Width: (cm) 8.2 Depth: (cm) 0.4 Area: (cm) 24.473 Volume: (cm) 9.789 % Reduction in Area: 60.3% % Reduction in Volume: 60.3% Epithelialization: Medium (34-66%) Tunneling: No Undermining: No Wound Description Classification: Full Thickness Without Exposed Support Structures Wound Margin: Distinct, outline attached Exudate Amount: Large Exudate Type: Purulent Exudate Color: yellow, brown, green Foul Odor After Cleansing: Yes Due to Product Use: No Slough/Fibrino Yes Wound Bed Granulation Amount: Medium (34-66%) Exposed Structure Granulation Quality: Red, Pink Fascia Exposed: No Necrotic Amount: Medium (34-66%) Fat Layer (Subcutaneous Tissue) Exposed: Yes Necrotic Quality: Adherent Slough Tendon Exposed: No Muscle Exposed: No Joint Exposed: No Bone Exposed: No Treatment Notes Wound #1 (Ankle) Wound Laterality: Left Cleanser Soap and Water Discharge Instruction: May shower and wash wound with dial antibacterial soap and water prior to dressing change. Wound Cleanser Discharge Instruction: Cleanse the wound with  wound cleanser prior to applying a clean dressing using gauze sponges, not tissue or cotton balls. Peri-Wound Care Ketoconazole Cream 2% Discharge Instruction: Apply Ketoconazole to lateral part of foot. Triamcinolone 15 (g) Discharge Instruction: T wound and leg o Zinc Oxide Ointment 30g  tube Discharge Instruction: Apply Zinc Oxide to periwound with each dressing change Sween Lotion (Moisturizing lotion) Discharge Instruction: Apply moisturizing lotion as directed Topical Mupirocin Ointment Discharge Instruction: Apply Mupirocin (Bactroban) under the calcium alginate Ag as instructed Primary Dressing KerraCel Ag Gelling Fiber Dressing, 4x5 in (silver alginate) Discharge Instruction: Apply silver alginate to wound bed as instructed Secondary Dressing ABD Pad, 8x10 Discharge Instruction: Apply over primary dressing as directed. Zetuvit Plus 4x8 in Discharge Instruction: Apply over primary dressing as directed. CarboFLEX Odor Control Dressing, 4x4 in Discharge Instruction: Apply over primary dressing as directed. Secured With Compression Wrap ThreePress (3 layer compression wrap) Discharge Instruction: Apply three layer compression as directed. Compression Stockings Add-Ons Electronic Signature(s) Signed: 05/28/2021 5:43:20 PM By: [name] Stall RN, BSN Entered By: [name] on 05/28/2021 16:44:32 -------------------------------------------------------------------------------- Vitals Details Patient Name: Date of Service: [name] 05/28/2021 12:45 PM Medical Record Number: [id number] Patient Account Number: [id number] Date of Birth/Sex: Treating RN: Dec 18, 1975 (45 y.o. [name] Primary Care Vonetta Foulk: [name] Other Clinician: Referring Bristyn Kulesza: Treating Travor Royce/Extender: [name] in Treatment: 16 Vital Signs Time Taken: 13:00 Temperature (F): 97.5 Pulse (bpm): 60 Respiratory Rate (breaths/min): 16 Blood Pressure (mmHg): 127/76 Reference Range: 80 - 120 mg / dl Electronic Signature(s) Signed: 05/28/2021 5:43:20 PM By: [name] Stall RN, BSN Entered By: [name] on 05/28/2021 13:01:59

## 2021-06-02 ENCOUNTER — Encounter (HOSPITAL_BASED_OUTPATIENT_CLINIC_OR_DEPARTMENT_OTHER): Payer: Medicaid Other | Admitting: Internal Medicine

## 2021-06-02 DIAGNOSIS — Z419 Encounter for procedure for purposes other than remedying health state, unspecified: Secondary | ICD-10-CM | POA: Diagnosis not present
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
## 2021-06-05 DIAGNOSIS — Z23 Encounter for immunization: Secondary | ICD-10-CM | POA: Diagnosis not present

## 2021-06-08 ENCOUNTER — Other Ambulatory Visit: Payer: Self-pay

## 2021-06-08 ENCOUNTER — Encounter: Payer: Self-pay | Admitting: Surgery

## 2021-06-08 ENCOUNTER — Ambulatory Visit (INDEPENDENT_AMBULATORY_CARE_PROVIDER_SITE_OTHER): Payer: Medicaid Other | Admitting: Surgery

## 2021-06-08 VITALS — BP 117/72 | HR 60 | Temp 98.7°F | Resp 20 | Ht 69.0 in | Wt 155.0 lb

## 2021-06-08 DIAGNOSIS — L97529 Non-pressure chronic ulcer of other part of left foot with unspecified severity: Secondary | ICD-10-CM | POA: Diagnosis not present
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
## 2021-06-09 ENCOUNTER — Encounter (HOSPITAL_BASED_OUTPATIENT_CLINIC_OR_DEPARTMENT_OTHER): Payer: Medicaid Other | Attending: Internal Medicine | Admitting: Internal Medicine

## 2021-06-09 DIAGNOSIS — I87332 Chronic venous hypertension (idiopathic) with ulcer and inflammation of left lower extremity: Secondary | ICD-10-CM | POA: Insufficient documentation

## 2021-06-09 DIAGNOSIS — I890 Lymphedema, not elsewhere classified: Secondary | ICD-10-CM | POA: Diagnosis not present

## 2021-06-09 DIAGNOSIS — L97322 Non-pressure chronic ulcer of left ankle with fat layer exposed: Secondary | ICD-10-CM | POA: Diagnosis not present

## 2021-06-09 DIAGNOSIS — E11622 Type 2 diabetes mellitus with other skin ulcer: Secondary | ICD-10-CM | POA: Diagnosis not present

## 2021-06-09 DIAGNOSIS — L97328 Non-pressure chronic ulcer of left ankle with other specified severity: Secondary | ICD-10-CM | POA: Diagnosis not present

## 2021-06-09 NOTE — Progress Notes (Signed)
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 studies I am not sure what the issue is here. He is very itchy under the wound on the left lateral foot The patient comes into clinic concerned that the 1 year of Medicaid that apparently was assigned to him when he entered the [place]. This is now coming to an end. I told him that I thought the best thing to do is the county social services i.e. [facility] social services I am not sure how else to help him with this. We of course will not discharge him which I think was his concern. He does have an appointment with Dr. Myra Gianotti on 11/7 with regards to the reflux studies. 11/8; the patient saw Dr. Myra Gianotti who noted mild at the saphenofemoral junction on the right but he did not feel that the vein was pathologic and he did not feel he would benefit from laser ablation. Suggested continuing to focus on wound care. We are using silver alginate with Bactroban Objective Constitutional Sitting or standing Blood Pressure is within target range for patient.. Pulse regular and within target range for patient.Marland Kitchen Respirations regular, non-labored and within target range.. Temperature is normal and  within the target range for the patient.Marland Kitchen Appears in no distress. Vitals Time Taken: 10:09 AM, Temperature: 97.4 F, Pulse: 52 bpm, Respiratory Rate: 16 breaths/min, Blood Pressure: 117/70 mmHg. General Notes: Wound exam; pedal pulses were palpable. His edema control is good. Some improvement in the dimensions of the wound is noted there is epithelialization in the middle part of the wound. No evidence of acute infection around the wound. Pedal  pulses are palpable Integumentary (Hair, Skin) Wound #1 status is Open. Original cause of wound was Trauma. The date acquired was: 10/14/2020. The wound has been in treatment 17 weeks. The wound is located on the Left Ankle. The wound measures 3.5cm length x 7.5cm width x 0.3cm depth; 20.617cm^2 area and 6.185cm^3 volume. There is Fat Layer (Subcutaneous Tissue) exposed. There is no tunneling or undermining noted. There is a large amount of serosanguineous drainage noted. The wound margin is distinct with the outline attached to the wound base. There is medium (34-66%) red, pink granulation within the wound bed. There is a medium (34-66%) amount of necrotic tissue within the wound bed including Adherent Slough. 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 There was a Three Layer Compression Therapy Procedure by Zandra Abts, RN. Post procedure Diagnosis Wound #1: Same as Pre-Procedure Plan Follow-up Appointments: Return Appointment in 1 week. - with Dr. Leanord Hawking - Interpreter Required Thursday Bathing/ Shower/ Hygiene: May shower with protection but do not get wound dressing(s) wet. Edema Control - Lymphedema / SCD / Other: Elevate legs to the level of the heart or above for 30 minutes daily and/or  when sitting, a frequency of: - 3-4 times a day throughout the day. Avoid standing for long periods of time. Exercise regularly Compression stocking or Garment 20-30 mm/Hg pressure to: - Pt. to order from [employer]. We will give leg measurements!! Off-Loading: Open toe surgical shoe to: - left foot WOUND #1: - Ankle Wound Laterality: Left Cleanser: Soap and Water 1 x Per Week/ Discharge Instructions: May shower and wash wound with dial antibacterial soap and water prior to dressing change. Cleanser: Wound Cleanser 1 x Per Week/ Discharge Instructions: Cleanse the wound with wound cleanser prior to applying a clean dressing using gauze sponges, not tissue or cotton balls. Peri-Wound Care: Ketoconazole Cream 2% 1 x Per Week/ Discharge Instructions: Apply Ketoconazole to lateral part of foot. Peri-Wound Care: Triamcinolone 15 (g) 1 x Per Week/ Discharge Instructions: T wound and leg o Peri-Wound Care: Zinc Oxide Ointment 30g tube 1 x Per Week/ Discharge Instructions: Apply Zinc Oxide to periwound with each dressing change Peri-Wound Care: Sween Lotion (Moisturizing lotion) 1 x Per Week/ Discharge Instructions: Apply moisturizing lotion as directed Topical: Mupirocin Ointment 1 x Per Week/ Discharge Instructions: Apply Mupirocin (Bactroban) under the calcium alginate Ag as instructed Prim Dressing: KerraCel Ag Gelling Fiber Dressing, 4x5 in (silver alginate) 1 x Per Week/ ary Discharge Instructions: Apply silver alginate to wound bed as instructed Secondary Dressing: ABD Pad, 8x10 1 x Per Week/ Discharge Instructions: Apply over primary dressing as directed. Secondary Dressing: Zetuvit Plus 4x8 in 1 x Per Week/ Discharge Instructions: Apply over primary dressing as directed. Secondary Dressing: CarboFLEX Odor Control Dressing, 4x4 in 1 x Per Week/ Discharge Instructions: Apply over primary dressing as directed. Com pression Wrap: ThreePress (3 layer compression wrap) 1 x Per  Week/ Discharge Instructions: Apply three layer compression as directed. 1. I continued with Bactroban and silver alginate to the wound surface. 2. No debridement was necessary 3. The patient saw Dr. Luther Redo who felt arterial and venous systems were operative and no interventions were required 4. The patient was interviewed through a [place] interpreter. I tried to emphasize through her to the patient to make sure that  what ever version of Medicaid he has continues. I was thinking that this would need to go through the [facility] of social services. If there is a better answer to this I do not know what it is. Electronic Signature(s) Signed: 06/09/2021 4:39:28 PM By: Baltazar Najjar MD Entered By: Baltazar Najjar on 06/09/2021 10:51:09 -------------------------------------------------------------------------------- SuperBill Details Patient Name: Date of Service: [name] [name] [name] 06/09/2021 Medical Record Number: [id number] Patient Account Number: [id number] Date of Birth/Sex: Treating RN: 1975/08/20 (45 y.o. [name], [name] Primary Care Provider: [name] Other Clinician: Referring Provider: Treating Provider/Extender: Darleen Crocker in Treatment: 17 Diagnosis Coding ICD-10 Codes Code Description [phone] Non-pressure chronic ulcer of left ankle with other specified severity I87.332 Chronic venous hypertension (idiopathic) with ulcer and inflammation of left lower extremity Facility Procedures CPT4 Code: [id number] Description: (Facility Use Only) [phone] - APPLY MULTLAY COMPRS LWR LT LEG Modifier: Quantity: 1 Physician Procedures : CPT4 Code Description Modifier [id number] - WC PHYS LEVEL 4 - EST PT ICD-10 Diagnosis Description L97.328 Non-pressure chronic ulcer of left ankle with other specified severity I87.332 Chronic venous hypertension (idiopathic) with ulcer and  inflammation of left lower extremity Quantity:  1 Electronic Signature(s) Signed: 06/09/2021 4:39:28 PM By: Baltazar Najjar MD Signed: 06/09/2021 5:21:59 PM By: Zandra Abts RN, BSN Signed: 06/09/2021 5:21:59 PM By: Zandra Abts RN, BSN Entered By: Zandra Abts on 06/09/2021 10:56:33

## 2021-06-10 NOTE — Progress Notes (Signed)
[name], [name] ([id number]) Visit Report for 06/09/2021 Arrival Information Details Patient [name]: Date of Service: [name] [place] NIE 06/09/2021 10:00 A M Medical Record Number: [id number] Patient Account Number: [id number] Date of Birth/Sex: Treating RN: [name] (45 y.o. [name], [name] Primary Care [name]: [name] Other Clinician: Referring [name]: Treating [name]/Extender: [name] in Treatment: 17 Visit Information History Since Last Visit Added or deleted any medications: No Patient Arrived: Crutches Any new allergies or adverse reactions: No Arrival Time: 10:08 Had a fall or experienced change in No Accompanied By: translator activities of daily living that may affect Transfer Assistance: Manual risk of falls: Patient Identification Verified: Yes Signs or symptoms of abuse/neglect since last visito No Secondary Verification Process Completed: Yes Hospitalized since last visit: No Patient Requires Transmission-Based Precautions: No Implantable device outside of the clinic excluding No Patient Has Alerts: No cellular tissue based products placed in the center since last visit: Has Dressing in Place as Prescribed: Yes Pain Present Now: No Electronic Signature(s) Signed: 06/10/2021 1:53:08 PM By: [name] Entered By: [name] on 06/09/2021 10:09:13 -------------------------------------------------------------------------------- Compression Therapy Details Patient [name]: Date of Service: [name] 06/09/2021 10:00 A M Medical Record Number: [id number] Patient Account Number: [id number] Date of Birth/Sex: Treating RN: [birth year] [name] (45 y.o. [name] Primary Care [name]: [name] Other Clinician: Referring Lauriel Helin: Treating [name]/Extender: [name] in Treatment: 17 Compression Therapy Performed for Wound Assessment: Wound #1 Left Ankle Performed By:  Clinician [name] Abts, RN Compression Type: Three Layer Post Procedure Diagnosis Same as Pre-procedure Electronic Signature(s) Signed: 06/09/2021 5:21:59 PM By: [name] Abts RN, BSN Entered By: [name] on 06/09/2021 10:41:02 -------------------------------------------------------------------------------- Encounter Discharge Information Details Patient [name]: Date of Service: [name] 06/09/2021 10:00 A M Medical Record Number: [id number] Patient Account Number: [id number] Date of Birth/Sex: Treating RN: [birth year] [name] (45 y.o. [name] Primary Care [name]: [name] Other Clinician: Referring [name]: Treating [name]/Extender: [name] in Treatment: 17 Encounter Discharge Information Items Discharge Condition: Stable Ambulatory Status: Crutches Discharge Destination: Home Transportation: Private Auto Accompanied By: interpreter Schedule Follow-up Appointment: Yes Clinical Summary of Care: Patient Declined Electronic Signature(s) Signed: 06/09/2021 5:21:59 PM By: [name] Abts RN, BSN Entered By: [name] on 06/09/2021 11:00:19 -------------------------------------------------------------------------------- Lower Extremity Assessment Details Patient [name]: Date of Service: [name] 06/09/2021 10:00 A M Medical Record Number: [id number] Patient Account Number: [id number] Date of Birth/Sex: Treating RN: [name] (45 y.o. [name] Primary Care [name]: [name] Other Clinician: Referring [name]: Treating [name]: [name] in Treatment: 17 Edema Assessment Assessed: [name]: No] [name]: No] Edema: [Left: N] [Right: o] Calf Left: Right: Point of Measurement: 28 cm From Medial Instep 31 cm Ankle Left: Right: Point of Measurement: 8 cm From Medial Instep 22 cm Vascular Assessment Pulses: Dorsalis Pedis Palpable:  [Left:Yes] Electronic Signature(s) Signed: 06/09/2021 5:21:59 PM By: [name] Abts RN, BSN Entered By: [name] on 06/09/2021 10:24:51 -------------------------------------------------------------------------------- Multi Wound Chart Details Patient [name]: Date of Service: [name] 06/09/2021 10:00 A M Medical Record Number: [id number] Patient Account Number: [id number] Date of Birth/Sex: Treating RN: [name] (45 y.o. [name] Primary Care [name]: [name] Other Clinician: Referring [name]: Treating [name]: [name] in Treatment: 17 Vital Signs Height(in): Pulse(bpm): 52 Weight(lbs): Blood Pressure(mmHg): 117/70 Body Mass Index(BMI): Temperature(F): 97.4 Respiratory Rate(breaths/min): 16 Photos: [N/A:N/A] Left Ankle N/A N/A Wound Location: Trauma N/A N/A  Wounding Event: Trauma, Other N/A N/A Primary Etiology: 10/14/2020 N/A N/A Date Acquired: 26 N/A N/A Weeks of Treatment: Open N/A N/A Wound Status: 3.5x7.5x0.3 N/A N/A Measurements L x W x D (cm) 20.617 N/A N/A A (cm) : rea 6.185 N/A N/A Volume (cm) : 66.50% N/A N/A % Reduction in A rea: 74.90% N/A N/A % Reduction in Volume: Full Thickness Without Exposed N/A N/A Classification: Support Structures Large N/A N/A Exudate Amount: Serosanguineous N/A N/A Exudate Type: red, brown N/A N/A Exudate Color: Distinct, outline attached N/A N/A Wound Margin: Medium (34-66%) N/A N/A Granulation Amount: Red, Pink N/A N/A Granulation Quality: Medium (34-66%) N/A N/A Necrotic Amount: Fat Layer (Subcutaneous Tissue): Yes N/A N/A Exposed Structures: Fascia: No Tendon: No Muscle: No Joint: No Bone: No Medium (34-66%) N/A N/A Epithelialization: Compression Therapy N/A N/A Procedures Performed: Treatment Notes Electronic Signature(s) Signed: 06/09/2021 4:39:28 PM By: [name] Najjar MD Signed: 06/09/2021 4:45:49 PM By:  [name] Mu RN Entered By: [name] on 06/09/2021 10:47:15 -------------------------------------------------------------------------------- Multi-Disciplinary Care Plan Details Patient [name]: Date of Service: [name] 06/09/2021 10:00 A M Medical Record Number: [id number] Patient Account Number: [id number] Date of Birth/Sex: Treating RN: [birth year] [name] (45 y.o. [name] Primary Care Waddell Iten: [name] Other Clinician: Referring Irvine Glorioso: Treating Loveah Like/Extender: [name] in Treatment: 17 Active Inactive Wound/Skin Impairment Nursing Diagnoses: Knowledge deficit related to ulceration/compromised skin integrity Goals: Patient/caregiver will verbalize understanding of skin care regimen Date Initiated: 02/05/2021 Target Resolution Date: 06/25/2021 Goal Status: Active Interventions: Assess patient/caregiver ability to obtain necessary supplies Assess patient/caregiver ability to perform ulcer/skin care regimen upon admission and as needed Provide education on ulcer and skin care Treatment Activities: Skin care regimen initiated : 02/05/2021 Topical wound management initiated : 02/05/2021 Notes: 03/31/21: Wound care regimen ongoing, target date extended. 04/21/21: Wound care ongoing, through interpreter patient states he is doing fine with his dressing changes. Electronic Signature(s) Signed: 06/09/2021 5:21:59 PM By: [name] Abts RN, BSN Entered By: [name] on 06/09/2021 10:41:19 -------------------------------------------------------------------------------- Pain Assessment Details Patient [name]: Date of Service: [name] 06/09/2021 10:00 A M Medical Record Number: [id number] Patient Account Number: [id number] Date of Birth/Sex: Treating RN: [birth year] [name] (45 y.o. [name], [name] Primary Care Torrey Horseman: [name] Other Clinician: Referring Babacar Haycraft: Treating Jamera Vanloan/Extender: [name] in Treatment: 17 Active Problems Location of Pain Severity and Description of Pain Patient Has Paino No Site Locations Pain Management and Medication Current Pain Management: Electronic Signature(s) Signed: 06/09/2021 4:45:49 PM By: [name] Mu RN Signed: 06/10/2021 1:53:08 PM By: [name] Entered By: [name] on 06/09/2021 10:09:37 -------------------------------------------------------------------------------- Patient/Caregiver Education Details Patient [name]: Date of Service: [name] 11/8/2022andnbsp10:00 A M Medical Record Number: [id number] Patient Account Number: [id number] Date of Birth/Gender: Treating RN: [birth year] (45 y.o. [name] Primary Care Physician: [name] Other Clinician: Referring Physician: Treating Physician/Extender: [name] in Treatment: 17 Education Assessment Education Provided To: Patient Education Topics Provided Wound/Skin Impairment: Methods: Explain/Verbal Responses: State content correctly [employer]) Signed: 06/09/2021 5:21:59 PM By: [name] Abts RN, BSN Entered By: [name] on 06/09/2021 10:41:41 -------------------------------------------------------------------------------- Wound Assessment Details Patient [name]: Date of Service: [name] 06/09/2021 10:00 A M Medical Record Number: [id number] Patient Account Number: [id number] Date of Birth/Sex: Treating RN: [birth year] (45 y.o. [name] Primary Care Doralee Kocak: [name] Other Clinician: Referring Pegah Segel: Treating Cailie Bosshart/Extender: [name] in Treatment: 17 Wound Status Wound Number: 1 Primary  Etiology: Trauma, Other Wound Location: Left Ankle Wound Status: Open Wounding Event: Trauma Date Acquired: 10/14/2020 Weeks Of Treatment: 17 Clustered Wound: No Photos Wound Measurements Length: (cm)  3.5 Width: (cm) 7.5 Depth: (cm) 0.3 Area: (cm) 20.617 Volume: (cm) 6.185 % Reduction in Area: 66.5% % Reduction in Volume: 74.9% Epithelialization: Medium (34-66%) Tunneling: No Undermining: No Wound Description Classification: Full Thickness Without Exposed Support Structures Wound Margin: Distinct, outline attached Exudate Amount: Large Exudate Type: Serosanguineous Exudate Color: red, brown Foul Odor After Cleansing: No Slough/Fibrino Yes Wound Bed Granulation Amount: Medium (34-66%) Exposed Structure Granulation Quality: Red, Pink Fascia Exposed: No Necrotic Amount: Medium (34-66%) Fat Layer (Subcutaneous Tissue) Exposed: Yes Necrotic Quality: Adherent Slough Tendon Exposed: No Muscle Exposed: No Joint Exposed: No Bone Exposed: No Treatment Notes Wound #1 (Ankle) Wound Laterality: Left Cleanser Soap and Water Discharge Instruction: May shower and wash wound with dial antibacterial soap and water prior to dressing change. Wound Cleanser Discharge Instruction: Cleanse the wound with wound cleanser prior to applying a clean dressing using gauze sponges, not tissue or cotton balls. Peri-Wound Care Ketoconazole Cream 2% Discharge Instruction: Apply Ketoconazole to lateral part of foot. Triamcinolone 15 (g) Discharge Instruction: T wound and leg o Zinc Oxide Ointment 30g tube Discharge Instruction: Apply Zinc Oxide to periwound with each dressing change Sween Lotion (Moisturizing lotion) Discharge Instruction: Apply moisturizing lotion as directed Topical Mupirocin Ointment Discharge Instruction: Apply Mupirocin (Bactroban) under the calcium alginate Ag as instructed Primary Dressing KerraCel Ag Gelling Fiber Dressing, 4x5 in (silver alginate) Discharge Instruction: Apply silver alginate to wound bed as instructed Secondary Dressing ABD Pad, 8x10 Discharge Instruction: Apply over primary dressing as directed. Zetuvit Plus 4x8 in Discharge Instruction: Apply  over primary dressing as directed. CarboFLEX Odor Control Dressing, 4x4 in Discharge Instruction: Apply over primary dressing as directed. Secured With Compression Wrap ThreePress (3 layer compression wrap) Discharge Instruction: Apply three layer compression as directed. Compression Stockings Add-Ons Electronic Signature(s) Signed: 06/09/2021 4:45:49 PM By: [name] Mu RN Signed: 06/09/2021 5:21:59 PM By: [name] Abts RN, BSN Entered By: [name] on 06/09/2021 10:25:26 -------------------------------------------------------------------------------- Vitals Details Patient [name]: Date of Service: [name], [name] 06/09/2021 10:00 A M Medical Record Number: [id number] Patient Account Number: [id number] Date of Birth/Sex: Treating RN: [name] (45 y.o. [name] Primary Care Tadan Shill: [name] Other Clinician: Referring Ruel Dimmick: Treating Kyeisha Janowicz/Extender: [name] in Treatment: 17 Vital Signs Time Taken: 10:09 Temperature (F): 97.4 Pulse (bpm): 52 Respiratory Rate (breaths/min): 16 Blood Pressure (mmHg): 117/70 Reference Range: 80 - 120 mg / dl Electronic Signature(s) Signed: 06/10/2021 1:53:08 PM By: [name] Entered By: [name] on 06/09/2021 10:09:30

## 2021-06-18 ENCOUNTER — Other Ambulatory Visit: Payer: Self-pay

## 2021-06-18 ENCOUNTER — Encounter (HOSPITAL_BASED_OUTPATIENT_CLINIC_OR_DEPARTMENT_OTHER): Payer: Medicaid Other | Admitting: Internal Medicine

## 2021-06-18 DIAGNOSIS — L97322 Non-pressure chronic ulcer of left ankle with fat layer exposed: Secondary | ICD-10-CM | POA: Diagnosis not present

## 2021-06-18 DIAGNOSIS — L97328 Non-pressure chronic ulcer of left ankle with other specified severity: Secondary | ICD-10-CM | POA: Diagnosis not present

## 2021-06-18 DIAGNOSIS — I87332 Chronic venous hypertension (idiopathic) with ulcer and inflammation of left lower extremity: Secondary | ICD-10-CM | POA: Diagnosis not present

## 2021-06-18 NOTE — Progress Notes (Signed)
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 discontinued and by infectious disease 10/27; our intake nurse reported large amount of drainage today more than usual. We have been using silver alginate. He still has not seen vein and vascular about the reflux studies I am not sure what the issue is here. He is very itchy under the wound on the left lateral foot The patient comes into clinic concerned that the 1 year of Medicaid that apparently was assigned to him when he entered the [place]. This is now coming to an end. I told him that I thought the best thing to do is the county social services i.e. [facility] social services I am not sure how else to help him with this. We of course will not discharge him which I think was his concern. He does have an appointment with Dr. Myra Gianotti on 11/7 with regards to the reflux studies. 11/8; the patient saw Dr. Myra Gianotti who noted mild at the saphenofemoral junction on the right but he did not feel that the vein was pathologic and he did not feel he would benefit from laser ablation. Suggested continuing to  focus on wound care. We are using silver alginate with Bactroban 11/17; wound looks about the same. Still a fair amount of drainage here. Although the wound is coming in surface area it still a deep wound full-thickness. I am using silver alginate with Bactroban He really applied for Medicaid. Wondering about a skin graft. I am uncertain about  that right now because of the drainage Objective Constitutional Sitting or standing Blood Pressure is within target range for patient.. Pulse regular and within target range for patient.Marland Kitchen Respirations regular, non-labored and within target range.. Temperature is normal and within the target range for the patient.Marland Kitchen Appears in no distress. Vitals Time Taken: 9:26 AM, Temperature: 97.7 F, Pulse: 65 bpm, Respiratory Rate: 18 breaths/min, Blood Pressure: 113/66 mmHg. General Notes: Wound exam; pedal pulses are palpable. His edema control seems good. He has epithelialization. Including a [place] of new skin however there is still a lot of drainage here. No concrete evidence of infection. Under illumination the wound surface looks healthy Integumentary (Hair, Skin) Wound #1 status is Open. Original cause of wound was Trauma. The date acquired was: 10/14/2020. The wound has been in treatment 19 weeks. The wound is located on the Left Ankle. The wound measures 3.8cm length x 8cm width x 0.2cm depth; 23.876cm^2 area and 4.775cm^3 volume. There is Fat Layer (Subcutaneous Tissue) exposed. There is no tunneling or undermining noted. There is a large amount of serosanguineous drainage noted. The wound margin is distinct with the outline attached to the wound base. There is large (67-100%) red, pink granulation within the wound bed. There is a small (1-33%) amount of necrotic tissue within the wound bed including Adherent Slough. Assessment Active Problems ICD-10 Non-pressure chronic ulcer of left ankle with other specified severity Chronic venous hypertension  (idiopathic) with ulcer and inflammation of left lower extremity Procedures Wound #1 Pre-procedure diagnosis of Wound #1 is a Trauma, Other located on the Left Ankle . There was a Three Layer Compression Therapy Procedure by Zenaida Deed, RN. Post procedure Diagnosis Wound #1: Same as Pre-Procedure Plan Follow-up Appointments: Return Appointment in 2 weeks. - with Dr. Leanord Hawking - Interpreter Required Thursday Nurse Visit: - one week Bathing/ Shower/ Hygiene: May shower with protection but do not get wound dressing(s) wet. Edema Control - Lymphedema / SCD / Other: Elevate legs to the level of the heart or above for 30 minutes daily and/or when sitting, a frequency of: - 3-4 times a day throughout the day. Avoid standing for long periods of time. Exercise regularly Compression stocking or Garment 20-30 mm/Hg pressure to: - Pt. to order from [employer]. We will give leg measurements!! Off-Loading: Open toe surgical shoe to: - left foot WOUND #1: - Ankle Wound Laterality: Left Cleanser: Soap and Water 1 x Per Week/ Discharge Instructions: May shower and wash wound with dial antibacterial soap and water prior to dressing change. Cleanser: Wound Cleanser 1 x Per Week/ Discharge Instructions: Cleanse the wound with wound cleanser prior to applying a clean dressing using gauze sponges, not tissue or cotton balls. Peri-Wound Care: Ketoconazole Cream 2% 1 x Per Week/ Discharge Instructions: Apply Ketoconazole to lateral part of foot. Peri-Wound Care: Triamcinolone 15 (g) 1 x Per Week/ Discharge Instructions: T wound and leg o Peri-Wound Care: Zinc Oxide Ointment 30g tube 1 x Per Week/ Discharge Instructions: Apply Zinc Oxide to periwound with each dressing change Peri-Wound Care: Sween Lotion (Moisturizing lotion) 1 x Per Week/ Discharge Instructions: Apply moisturizing lotion as directed Topical: Mupirocin Ointment 1 x Per Week/ Discharge Instructions: Apply Mupirocin (Bactroban)  under the calcium alginate Ag as instructed Prim Dressing: Hydrofera Blue Classic Foam, 4x4 in 1 x Per Week/ ary Discharge Instructions: Moisten with saline prior to applying to wound bed Secondary Dressing: ABD Pad, 8x10 1 x Per Week/ Discharge Instructions: Apply over primary dressing as directed. Secondary Dressing: Zetuvit Plus 4x8  in 1 x Per Week/ Discharge Instructions: Apply over primary dressing as directed. Secondary Dressing: CarboFLEX Odor Control Dressing, 4x4 in 1 x Per Week/ Discharge Instructions: Apply over primary dressing as directed. Com pression Wrap: ThreePress (3 layer compression wrap) 1 x Per Week/ Discharge Instructions: Apply three layer compression as directed. 1. I change the primary dressing to [facility] continuing with the Bactroban 2. Still under the same compression 3. Not certain I see a reason for the continued massive amounts of drainage she does not have a lot of edema. We will consider reculturing the left anterior lower leg wound. 4. The patient has Medicaid now wondering about a skin site that we have previously talked about. Again the drainage issue provides a problem. Most medications will now except TheraSkin and I would really consider using this but I do not think he be able to keep this in place given the amount of drainage here Electronic Signature(s) Signed: 06/18/2021 5:16:34 PM By: Baltazar Najjar MD Entered By: Baltazar Najjar on 06/18/2021 11:58:08 -------------------------------------------------------------------------------- SuperBill Details Patient Name: Date of Service: [name] 06/18/2021 Medical Record Number: [id number] Patient Account Number: [id number] Date of Birth/Sex: Treating RN: 08-02-1976 (45 y.o. [name] Primary Care Provider: [name] Other Clinician: Referring Provider: Treating Provider/Extender: Darleen Crocker in Treatment: 19 Diagnosis Coding ICD-10  Codes Code Description [phone] Non-pressure chronic ulcer of left ankle with other specified severity I87.332 Chronic venous hypertension (idiopathic) with ulcer and inflammation of left lower extremity Facility Procedures CPT4 Code: 68088110 Description: (Facility Use Only) [id number] - APPLY MULTLAY COMPRS LWR LT LEG Modifier: Quantity: 1 Physician Procedures Electronic Signature(s) Signed: 06/18/2021 5:16:34 PM By: Baltazar Najjar MD Entered By: Baltazar Najjar on 06/18/2021 11:58:26

## 2021-06-18 NOTE — Progress Notes (Signed)
MCCLELLAN, DEMARAIS ([id number]) Visit Report for 06/18/2021 Arrival Information Details Patient Name: Date of Service: [name] 06/18/2021 9:30 A M Medical Record Number: [id number] Patient Account Number: [id number] Date of Birth/Sex: Treating RN: 31-Oct-1975 (45 y.o. [name], [name] Primary Care Fujie Dickison: [name] Other Clinician: Referring Stacie Knutzen: Treating Jahmeer Porche/Extender: Darleen Crocker in Treatment: 19 Visit Information History Since Last Visit Added or deleted any medications: No Patient Arrived: Ambulatory Any new allergies or adverse reactions: No Arrival Time: 09:21 Had a fall or experienced change in No Accompanied By: interpreter activities of daily living that may affect Transfer Assistance: None risk of falls: Patient Identification Verified: Yes Signs or symptoms of abuse/neglect since last visito No Secondary Verification Process Completed: Yes Hospitalized since last visit: No Patient Requires Transmission-Based Precautions: No Implantable device outside of the clinic excluding No Patient Has Alerts: No cellular tissue based products placed in the center since last visit: Has Dressing in Place as Prescribed: Yes Has Compression in Place as Prescribed: Yes Pain Present Now: No Electronic Signature(s) Signed: 06/18/2021 5:41:09 PM By: [name], BSN Entered By: Zenaida Deed on 06/18/2021 09:26:13 -------------------------------------------------------------------------------- Compression Therapy Details Patient Name: Date of Service: [name] 06/18/2021 9:30 A M Medical Record Number: [id number] Patient Account Number: [id number] Date of Birth/Sex: Treating RN: 06-Dec-1975 (45 y.o. [name] Primary Care Phyllis Abelson: [name] Other Clinician: Referring Deshanta Lady: Treating Davarious Tumbleson/Extender: Darleen Crocker in Treatment: 19 Compression Therapy Performed for  Wound Assessment: Wound #1 Left Ankle Performed By: Clinician Zenaida Deed, RN Compression Type: Three Layer Post Procedure Diagnosis Same as Pre-procedure Electronic Signature(s) Signed: 06/18/2021 5:41:09 PM By: [name], BSN Entered By: Zenaida Deed on 06/18/2021 09:57:06 -------------------------------------------------------------------------------- Lower Extremity Assessment Details Patient Name: Date of Service: [name] 06/18/2021 9:30 A M Medical Record Number: [id number] Patient Account Number: [id number] Date of Birth/Sex: Treating RN: September 10, 1975 (45 y.o. [name] Primary Care Jesusita Jocelyn: [name] Other Clinician: Referring Filimon Miranda: Treating Fabiola Mudgett/Extender: Darleen Crocker in Treatment: 19 Edema Assessment Assessed: [name]: No] [name]: No] Edema: [Left: N] [Right: o] Calf Left: Right: Point of Measurement: 28 cm From Medial Instep 31.5 cm Ankle Left: Right: Point of Measurement: 8 cm From Medial Instep 22.2 cm Vascular Assessment Pulses: Dorsalis Pedis Palpable: [Left:Yes] Electronic Signature(s) Signed: 06/18/2021 5:41:09 PM By: [name], BSN Entered By: Zenaida Deed on 06/18/2021 09:34:38 -------------------------------------------------------------------------------- Multi Wound Chart Details Patient Name: Date of Service: [name] 06/18/2021 9:30 A M Medical Record Number: [id number] Patient Account Number: [id number] Date of Birth/Sex: Treating RN: 06/11/1976 (45 y.o. [name] Primary Care Sabena Winner: [name] Other Clinician: Referring Abdulahi Schor: Treating Lummie Montijo/Extender: Darleen Crocker in Treatment: 19 Vital Signs Height(in): Pulse(bpm): 65 Weight(lbs): Blood Pressure(mmHg): 113/66 Body Mass Index(BMI): Temperature(F): 97.7 Respiratory Rate(breaths/min): 18 Photos: [1:Left Ankle] [N/A:N/A N/A] Wound Location:  [1:Trauma] [N/A:N/A] Wounding Event: [1:Trauma, Other] [N/A:N/A] Primary Etiology: [1:10/14/2020] [N/A:N/A] Date Acquired: [1:19] [N/A:N/A] Weeks of Treatment: [1:Open] [N/A:N/A] Wound Status: [1:3.8x8x0.2] [N/A:N/A] Measurements L x W x D (cm) [1:23.876] [N/A:N/A] A (cm) : rea [1:4.775] [N/A:N/A] Volume (cm) : [1:61.20%] [N/A:N/A] % Reduction in A rea: [1:80.60%] [N/A:N/A] % Reduction in Volume: [1:Full Thickness Without Exposed] [N/A:N/A] Classification: [1:Support Structures Large] [N/A:N/A] Exudate Amount: [1:Serosanguineous] [N/A:N/A] Exudate Type: [1:red, brown] [N/A:N/A] Exudate Color: [1:Distinct, outline attached] [N/A:N/A] Wound Margin: [1:Large (67-100%)] [N/A:N/A] Granulation Amount: [1:Red, Pink] [N/A:N/A] Granulation Quality: [1:Small (1-33%)] [N/A:N/A] Necrotic Amount: [1:Fat Layer (  Subcutaneous Tissue): Yes N/A] Exposed Structures: [1:Fascia: No Tendon: No Muscle: No Joint: No Bone: No Small (1-33%)] [N/A:N/A] Epithelialization: [1:Compression Therapy] [N/A:N/A] Treatment Notes Electronic Signature(s) Signed: 06/18/2021 5:16:34 PM By: Baltazar Najjar MD Signed: 06/18/2021 5:18:51 PM By: Shawn Stall RN, BSN Entered By: Baltazar Najjar on 06/18/2021 11:49:58 -------------------------------------------------------------------------------- Multi-Disciplinary Care Plan Details Patient Name: Date of Service: [name] 06/18/2021 9:30 A M Medical Record Number: [id number] Patient Account Number: [id number] Date of Birth/Sex: Treating RN: [birth year] (45 y.o. [name] Primary Care Shala Baumbach: [name] Other Clinician: Referring Olis Viverette: Treating Cherlyn Syring/Extender: Darleen Crocker in Treatment: 19 Multidisciplinary Care Plan reviewed with physician Active Inactive Wound/Skin Impairment Nursing Diagnoses: Knowledge deficit related to ulceration/compromised skin integrity Goals: Patient/caregiver will verbalize  understanding of skin care regimen Date Initiated: 02/05/2021 Target Resolution Date: 06/25/2021 Goal Status: Active Interventions: Assess patient/caregiver ability to obtain necessary supplies Assess patient/caregiver ability to perform ulcer/skin care regimen upon admission and as needed Provide education on ulcer and skin care Treatment Activities: Skin care regimen initiated : 02/05/2021 Topical wound management initiated : 02/05/2021 Notes: 03/31/21: Wound care regimen ongoing, target date extended. 04/21/21: Wound care ongoing, through interpreter patient states he is doing fine with his dressing changes. Electronic Signature(s) Signed: 06/18/2021 5:41:09 PM By: [name], BSN Entered By: Zenaida Deed on 06/18/2021 09:42:02 -------------------------------------------------------------------------------- Pain Assessment Details Patient Name: Date of Service: [name] 06/18/2021 9:30 A M Medical Record Number: [id number] Patient Account Number: [id number] Date of Birth/Sex: Treating RN: Aug 29, 1975 (45 y.o. [name] Primary Care Tommaso Cavitt: [name] Other Clinician: Referring Caylin Raby: Treating Roselinda Bahena/Extender: Darleen Crocker in Treatment: 19 Active Problems Location of Pain Severity and Description of Pain Patient Has Paino No Site Locations Rate the pain. Current Pain Level: 0 Pain Management and Medication Current Pain Management: Electronic Signature(s) Signed: 06/18/2021 5:41:09 PM By: [name], BSN Entered By: Zenaida Deed on 06/18/2021 09:33:49 -------------------------------------------------------------------------------- Patient/Caregiver Education Details Patient Name: Date of Service: [name] 11/17/2022andnbsp9:30 A M Medical Record Number: [id number] Patient Account Number: [id number] Date of Birth/Gender: Treating RN: 02-May-1976 (45 y.o. [name] Primary Care  Physician: [name] Other Clinician: Referring Physician: Treating Physician/Extender: Darleen Crocker in Treatment: 19 Education Assessment Education Provided To: Patient Education Topics Provided Venous: Methods: Explain/Verbal Responses: Reinforcements needed, State content correctly Wound/Skin Impairment: Methods: Explain/Verbal Responses: Reinforcements needed, State content correctly Electronic Signature(s) Signed: 06/18/2021 5:41:09 PM By: [name], BSN Entered By: Zenaida Deed on 06/18/2021 09:42:36 -------------------------------------------------------------------------------- Wound Assessment Details Patient Name: Date of Service: [name] 06/18/2021 9:30 A M Medical Record Number: [id number] Patient Account Number: [id number] Date of Birth/Sex: Treating RN: Oct 04, 1975 (45 y.o. [name] Primary Care Maylee Bare: [name] Other Clinician: Referring Alie Hardgrove: Treating Kodah Maret/Extender: Darleen Crocker in Treatment: 19 Wound Status Wound Number: 1 Primary Etiology: Trauma, Other Wound Location: Left Ankle Wound Status: Open Wounding Event: Trauma Date Acquired: 10/14/2020 Weeks Of Treatment: 19 Clustered Wound: No Photos Wound Measurements Length: (cm) 3.8 Width: (cm) 8 Depth: (cm) 0.2 Area: (cm) 23.876 Volume: (cm) 4.775 % Reduction in Area: 61.2% % Reduction in Volume: 80.6% Epithelialization: Small (1-33%) Tunneling: No Undermining: No Wound Description Classification: Full Thickness Without Exposed Support Structu Wound Margin: Distinct, outline attached Exudate Amount: Large Exudate Type: Serosanguineous Exudate Color: red, brown res Foul Odor After Cleansing: No Slough/Fibrino Yes Wound Bed Granulation Amount: Large (67-100%) Exposed Structure Granulation Quality:  Red, Pink Fascia Exposed: No Necrotic Amount: Small (1-33%) Fat Layer (Subcutaneous  Tissue) Exposed: Yes Necrotic Quality: Adherent Slough Tendon Exposed: No Muscle Exposed: No Joint Exposed: No Bone Exposed: No Electronic Signature(s) Signed: 06/18/2021 5:41:09 PM By: [name], BSN Entered By: Zenaida Deed on 06/18/2021 10:14:32 -------------------------------------------------------------------------------- Vitals Details Patient Name: Date of Service: [name] 06/18/2021 9:30 A M Medical Record Number: [id number] Patient Account Number: [id number] Date of Birth/Sex: Treating RN: 04-29-1976 (45 y.o. [name] Primary Care Chanique Duca: [name] Other Clinician: Referring Kharisma Glasner: Treating Makensie Mulhall/Extender: Darleen Crocker in Treatment: 19 Vital Signs Time Taken: 09:26 Temperature (F): 97.7 Pulse (bpm): 65 Respiratory Rate (breaths/min): 18 Blood Pressure (mmHg): 113/66 Reference Range: 80 - 120 mg / dl Electronic Signature(s) Signed: 06/18/2021 5:41:09 PM By: [name], BSN Entered By: Zenaida Deed on 06/18/2021 09:26:37

## 2021-06-24 ENCOUNTER — Other Ambulatory Visit: Payer: Self-pay

## 2021-06-24 ENCOUNTER — Encounter (HOSPITAL_BASED_OUTPATIENT_CLINIC_OR_DEPARTMENT_OTHER): Payer: Medicaid Other | Admitting: Physician Assistant

## 2021-06-24 DIAGNOSIS — I87332 Chronic venous hypertension (idiopathic) with ulcer and inflammation of left lower extremity: Secondary | ICD-10-CM | POA: Diagnosis not present

## 2021-06-24 DIAGNOSIS — L97328 Non-pressure chronic ulcer of left ankle with other specified severity: Secondary | ICD-10-CM | POA: Diagnosis not present

## 2021-06-24 NOTE — Progress Notes (Signed)
[name], [name] ([id number]) Visit Report for 06/24/2021 SuperBill Details Patient Name: Date of Service: [name] 06/24/2021 Medical Record Number: [id number] Patient Account Number: [id number] Date of Birth/Sex: Treating RN: [birth year] (45 y.o. [name] Primary Care Provider: [name] Other Clinician: Referring Provider: Treating Provider/Extender: Percell Locus in Treatment: 19 Diagnosis Coding ICD-10 Codes Code Description [phone] Non-pressure chronic ulcer of left ankle with other specified severity I87.332 Chronic venous hypertension (idiopathic) with ulcer and inflammation of left lower extremity Facility Procedures CPT4 Code Description Modifier Quantity [id number] (Facility Use Only) [phone] - APPLY MULTLAY COMPRS LWR LT LEG 1 Electronic Signature(s) Signed: 06/24/2021 5:33:12 PM By: Lenda Kelp PA-C Signed: 06/24/2021 5:44:39 PM By: Zandra Abts RN, BSN Entered By: Zandra Abts on 06/24/2021 11:31:02

## 2021-06-24 NOTE — Progress Notes (Signed)
RUDOLPH, DAOUST ([id number]) Visit Report for 06/24/2021 Arrival Information Details Patient Name: Date of Service: [name] 06/24/2021 10:15 A M Medical Record Number: [id number] Patient Account Number: [id number] Date of Birth/Sex: Treating RN: 1976-07-23 (45 y.o. Daphane Shepherd, Emeterio Reeve Primary Care Sayge Salvato: [name] Other Clinician: Referring Nephtali Docken: Treating Maresha Anastos/Extender: Percell Locus in Treatment: 19 Visit Information History Since Last Visit Added or deleted any medications: No Patient Arrived: Ambulatory Any new allergies or adverse reactions: No Arrival Time: 10:38 Had a fall or experienced change in No Accompanied By: interpreter activities of daily living that may affect Transfer Assistance: None risk of falls: Patient Identification Verified: Yes Signs or symptoms of abuse/neglect since last visito No Secondary Verification Process Completed: Yes Hospitalized since last visit: No Patient Requires Transmission-Based Precautions: No Implantable device outside of the clinic excluding No Patient Has Alerts: No cellular tissue based products placed in the center since last visit: Has Dressing in Place as Prescribed: Yes Has Compression in Place as Prescribed: Yes Pain Present Now: No Electronic Signature(s) Signed: 06/24/2021 5:44:39 PM By: Zandra Abts RN, BSN Entered By: Zandra Abts on 06/24/2021 11:29:39 -------------------------------------------------------------------------------- Compression Therapy Details Patient Name: Date of Service: [name] 06/24/2021 10:15 A M Medical Record Number: [id number] Patient Account Number: [id number] Date of Birth/Sex: Treating RN: Feb 28, 1976 (45 y.o. [name] Primary Care Vicke Plotner: [name] Other Clinician: Referring Shandiin Eisenbeis: Treating Kipp Shank/Extender: Percell Locus in Treatment: 19 Compression Therapy Performed for  Wound Assessment: Wound #1 Left Ankle Performed By: Clinician Zandra Abts, RN Compression Type: Three Emergency planning/management officer) Signed: 06/24/2021 5:44:39 PM By: Zandra Abts RN, BSN Entered By: Zandra Abts on 06/24/2021 11:30:25 -------------------------------------------------------------------------------- Encounter Discharge Information Details Patient Name: Date of Service: [name] 06/24/2021 10:15 A M Medical Record Number: [id number] Patient Account Number: [id number] Date of Birth/Sex: Treating RN: Feb 16, 1976 (46 y.o. [name] Primary Care Ellora Varnum: [name] Other Clinician: Referring Shawne Bulow: Treating Aidyn Kellis/Extender: Percell Locus in Treatment: 19 Encounter Discharge Information Items Discharge Condition: Stable Ambulatory Status: Ambulatory Discharge Destination: Home Transportation: Private Auto Accompanied By: interpreter Schedule Follow-up Appointment: Yes Clinical Summary of Care: Patient Declined Electronic Signature(s) Signed: 06/24/2021 5:44:39 PM By: Zandra Abts RN, BSN Entered By: Zandra Abts on 06/24/2021 11:30:55 -------------------------------------------------------------------------------- Wound Assessment Details Patient Name: Date of Service: [name] 06/24/2021 10:15 A M Medical Record Number: [id number] Patient Account Number: [id number] Date of Birth/Sex: Treating RN: [birth year] (45 y.o. [name] Primary Care Bemnet Trovato: [name] Other Clinician: Referring Gargi Berch: Treating Linken Mcglothen/Extender: Percell Locus in Treatment: 19 Wound Status Wound Number: 1 Primary Etiology: Trauma, Other Wound Location: Left Ankle Wound Status: Open Wounding Event: Trauma Date Acquired: 10/14/2020 Weeks Of Treatment: 19 Clustered Wound: No Wound Measurements Length: (cm) 3.8 Width: (cm) 8 Depth: (cm) 0.2 Area: (cm) 23.876 Volume:  (cm) 4.775 % Reduction in Area: 61.2% % Reduction in Volume: 80.6% Epithelialization: Small (1-33%) Tunneling: No Undermining: No Wound Description Classification: Full Thickness Without Exposed Support Structures Wound Margin: Distinct, outline attached Exudate Amount: Large Exudate Type: Serosanguineous Exudate Color: red, brown Foul Odor After Cleansing: No Slough/Fibrino Yes Wound Bed Granulation Amount: Large (67-100%) Exposed Structure Granulation Quality: Red, Pink Fascia Exposed: No Necrotic Amount: Small (1-33%) Fat Layer (Subcutaneous Tissue) Exposed: Yes Necrotic Quality: Adherent Slough Tendon Exposed: No Muscle Exposed: No Joint Exposed: No Bone Exposed: No Treatment Notes Wound #1 (Ankle) Wound Laterality:  Left Cleanser Soap and Water Discharge Instruction: May shower and wash wound with dial antibacterial soap and water prior to dressing change. Wound Cleanser Discharge Instruction: Cleanse the wound with wound cleanser prior to applying a clean dressing using gauze sponges, not tissue or cotton balls. Peri-Wound Care Ketoconazole Cream 2% Discharge Instruction: Apply Ketoconazole to lateral part of foot. Triamcinolone 15 (g) Discharge Instruction: T wound and leg o Zinc Oxide Ointment 30g tube Discharge Instruction: Apply Zinc Oxide to periwound with each dressing change Sween Lotion (Moisturizing lotion) Discharge Instruction: Apply moisturizing lotion as directed Topical Mupirocin Ointment Discharge Instruction: Apply Mupirocin (Bactroban) under the calcium alginate Ag as instructed Primary Dressing Hydrofera Blue Classic Foam, 4x4 in Discharge Instruction: Moisten with saline prior to applying to wound bed Secondary Dressing ABD Pad, 8x10 Discharge Instruction: Apply over primary dressing as directed. Zetuvit Plus 4x8 in Discharge Instruction: Apply over primary dressing as directed. CarboFLEX Odor Control Dressing, 4x4 in Discharge  Instruction: Apply over primary dressing as directed. Secured With Compression Wrap ThreePress (3 layer compression wrap) Discharge Instruction: Apply three layer compression as directed. Compression Stockings Add-Ons Electronic Signature(s) Signed: 06/24/2021 5:44:39 PM By: Zandra Abts RN, BSN Entered By: Zandra Abts on 06/24/2021 11:30:11 -------------------------------------------------------------------------------- Vitals Details Patient Name: Date of Service: [name] 06/24/2021 10:15 A M Medical Record Number: [id number] Patient Account Number: [id number] Date of Birth/Sex: Treating RN: January 04, 1976 (45 y.o. [name] Primary Care Emelina Hinch: [name] Other Clinician: Referring Monic Engelmann: Treating Marliyah Reid/Extender: Percell Locus in Treatment: 19 Vital Signs Time Taken: 10:38 Temperature (F): 98.0 Pulse (bpm): 71 Respiratory Rate (breaths/min): 16 Blood Pressure (mmHg): 120/79 Reference Range: 80 - 120 mg / dl Electronic Signature(s) Signed: 06/24/2021 5:44:39 PM By: Zandra Abts RN, BSN Signed: 06/24/2021 5:44:39 PM By: Zandra Abts RN, BSN Entered By: Zandra Abts on 06/24/2021 11:29:55

## 2021-07-02 ENCOUNTER — Encounter (HOSPITAL_BASED_OUTPATIENT_CLINIC_OR_DEPARTMENT_OTHER): Payer: Medicaid Other | Attending: Internal Medicine | Admitting: Internal Medicine

## 2021-07-02 ENCOUNTER — Other Ambulatory Visit: Payer: Self-pay

## 2021-07-02 DIAGNOSIS — L97322 Non-pressure chronic ulcer of left ankle with fat layer exposed: Secondary | ICD-10-CM | POA: Diagnosis not present

## 2021-07-02 DIAGNOSIS — L97328 Non-pressure chronic ulcer of left ankle with other specified severity: Secondary | ICD-10-CM | POA: Diagnosis not present

## 2021-07-02 DIAGNOSIS — I87332 Chronic venous hypertension (idiopathic) with ulcer and inflammation of left lower extremity: Secondary | ICD-10-CM | POA: Insufficient documentation

## 2021-07-02 DIAGNOSIS — Z419 Encounter for procedure for purposes other than remedying health state, unspecified: Secondary | ICD-10-CM | POA: Diagnosis not present

## 2021-07-02 NOTE — Progress Notes (Signed)
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 if he is still taking antibiotics but these are listed as being discontinued and by infectious disease 10/27; our intake nurse reported large amount of drainage today more than usual. We have been using silver alginate. He still has not seen vein and vascular about the reflux studies I am not sure what the issue is here. He is very itchy under the wound on the left lateral foot The patient comes into clinic concerned that the 1 year of Medicaid that apparently was assigned to him when he entered the [place]. This is now coming to an end. I told him that I thought the best thing to do is the county social services i.e. [facility] social services I am not sure how else to help him with this. We of course will not discharge him which I think was his concern. He does have an appointment with Dr. Myra Gianotti on 11/7 with regards to the reflux studies. 11/8; the patient saw Dr. Myra Gianotti who noted mild at the saphenofemoral junction on the right but he did not feel that the vein was pathologic and he did not feel he would benefit from laser  ablation. Suggested continuing to focus on wound care. We are using silver alginate with Bactroban 11/17; wound looks about the same. Still a fair amount of drainage here. Although the wound is coming in surface area it still a deep wound full-thickness. I am using silver alginate with Bactroban He really applied for Medicaid. Wondering about a skin graft. I am uncertain about that right now  because of the drainage 12/1; wound is measuring slightly smaller in width. Surface of this looks better. Changed him to [facility] still using topical Bactroban Objective Constitutional Sitting or standing Blood Pressure is within target range for patient.. Pulse regular and within target range for patient.Marland Kitchen Respirations regular, non-labored and within target range.. Temperature is normal and within the target range for the patient.Marland Kitchen Appears in no distress. Vitals Time Taken: 12:50 PM, Temperature: 97.6 F, Pulse: 63 bpm, Respiratory Rate: 18 breaths/min, Blood Pressure: 126/77 mmHg. General Notes: Wound exam; pedal pulses are palpable his edema control is adequate. He has epithelialization. Surface of the wound looks improved. There is no evidence of infection Integumentary (Hair, Skin) Wound #1 status is Open. Original cause of wound was Trauma. The date acquired was: 10/14/2020. The wound has been in treatment 21 weeks. The wound is located on the Left Ankle. The wound measures 3.7cm length x 7.2cm width x 0.2cm depth; 20.923cm^2 area and 4.185cm^3 volume. There is Fat Layer (Subcutaneous Tissue) exposed. There is no tunneling or undermining noted. There is a large amount of serosanguineous drainage noted. The wound margin is distinct with the outline attached to the wound base. There is large (67-100%) red, pink granulation within the wound bed. There is a small (1-33%) amount of necrotic tissue within the wound bed including Adherent Slough. Assessment Active Problems ICD-10 Non-pressure chronic  ulcer of left ankle with other specified severity Chronic venous hypertension (idiopathic) with ulcer and inflammation of left lower extremity Procedures Wound #1 Pre-procedure diagnosis of Wound #1 is a Trauma, Other located on the Left Ankle . There was a Three Layer Compression Therapy Procedure by Zenaida Deed, RN. Post procedure Diagnosis Wound #1: Same as Pre-Procedure Plan Follow-up Appointments: Return Appointment in 1 week. - with Dr. Leanord Hawking - Interpreter Required Thursday Bathing/ Shower/ Hygiene: May shower with protection but do not get wound dressing(s) wet. Edema Control - Lymphedema / SCD / Other: Elevate legs to the level of the heart or above for 30 minutes daily and/or when sitting, a frequency of: - 3-4 times a day throughout the day. Avoid standing for long periods of time. Exercise regularly Compression stocking or Garment 20-30 mm/Hg pressure to: - Pt. to order from [employer]. We will give leg measurements!! Off-Loading: Open toe surgical shoe to: - left foot WOUND #1: - Ankle Wound Laterality: Left Cleanser: Soap and Water 1 x Per Week/ Discharge Instructions: May shower and wash wound with dial antibacterial soap and water prior to dressing change. Cleanser: Wound Cleanser 1 x Per Week/ Discharge Instructions: Cleanse the wound with wound cleanser prior to applying a clean dressing using gauze sponges, not tissue or cotton balls. Peri-Wound Care: Ketoconazole Cream 2% 1 x Per Week/ Discharge Instructions: Apply Ketoconazole to lateral part of foot. Peri-Wound Care: Triamcinolone 15 (g) 1 x Per Week/ Discharge Instructions: T wound and leg o Peri-Wound Care: Zinc Oxide Ointment 30g tube 1 x Per Week/ Discharge Instructions: Apply Zinc Oxide to periwound with each dressing change Peri-Wound Care: Sween Lotion (Moisturizing lotion) 1 x Per Week/ Discharge Instructions: Apply moisturizing lotion as directed Topical: Mupirocin Ointment 1 x Per  Week/ Discharge Instructions: Apply Mupirocin (Bactroban) under the calcium alginate Ag as instructed Prim Dressing: Hydrofera Blue Classic Foam, 4x4 in 1 x Per Week/ ary Discharge Instructions: Moisten with saline prior to applying to wound bed Secondary Dressing: ABD Pad, 8x10 1 x Per Week/ Discharge Instructions: Apply over primary dressing as directed. Secondary Dressing: Zetuvit Plus 4x8 in  1 x Per Week/ Discharge Instructions: Apply over primary dressing as directed. Secondary Dressing: CarboFLEX Odor Control Dressing, 4x4 in 1 x Per Week/ Discharge Instructions: Apply over primary dressing as directed. Com pression Wrap: ThreePress (3 layer compression wrap) 1 x Per Week/ Discharge Instructions: Apply three layer compression as directed. #1 I have continued with Hydrofera Blue with a layer of about Bactroban. 2. Still a lot of drainage backings it to fit under compression 3 layer 3. He might be eligible for a TheraSkin under Medicaid but I will probably wait into the new year to consider ordering Electronic Signature(s) Signed: 07/02/2021 4:15:39 PM By: [name], BSN Signed: 07/02/2021 4:34:24 PM By: Baltazar Najjar MD Entered By: Zenaida Deed on 07/02/2021 13:33:02 -------------------------------------------------------------------------------- SuperBill Details Patient Name: Date of Service: [name] [name] [place] NA [name] 07/02/2021 Medical Record Number: [id number] Patient Account Number: [id number] Date of Birth/Sex: Treating RN: [birth year] (45 y.o. [name] Primary Care Provider: [name] Other Clinician: Referring Provider: Treating Provider/Extender: Darleen Crocker in Treatment: 21 Diagnosis Coding ICD-10 Codes Code Description [phone] Non-pressure chronic ulcer of left ankle with other specified severity I87.332 Chronic venous hypertension (idiopathic) with ulcer and inflammation of left lower extremity Facility Procedures CPT4  Code: 91478295 Description: (Facility Use Only) 62130QM - APPLY MULTLAY COMPRS LWR LT LEG Modifier: Quantity: 1 Physician Procedures Electronic Signature(s) Signed: 07/02/2021 4:15:39 PM By: [name], BSN Signed: 07/02/2021 4:34:24 PM By: Baltazar Najjar MD Entered By: Zenaida Deed on 07/02/2021 13:31:31

## 2021-07-02 NOTE — Progress Notes (Signed)
EGIDIO, LOFGREN ([id number]) Visit Report for 07/02/2021 Arrival Information Details Patient Name: Date of Service: [name] [place] NIE 07/02/2021 12:30 PM Medical Record Number: [id number] Patient Account Number: [id number] Date of Birth/Sex: Treating RN: 09-24-1975 (45 y.o. [name] Primary Care Temeka Pore: [name] Other Clinician: Referring Naisha Wisdom: Treating Mande Auvil/Extender: Darleen Crocker in Treatment: 21 Visit Information History Since Last Visit Added or deleted any medications: No Patient Arrived: Ambulatory Any new allergies or adverse reactions: No Arrival Time: 12:47 Had a fall or experienced change in No Accompanied By: interpreter activities of daily living that may affect Transfer Assistance: None risk of falls: Patient Identification Verified: Yes Signs or symptoms of abuse/neglect since last visito No Secondary Verification Process Completed: Yes Hospitalized since last visit: No Patient Requires Transmission-Based Precautions: No Implantable device outside of the clinic excluding No Patient Has Alerts: No cellular tissue based products placed in the center since last visit: Has Dressing in Place as Prescribed: Yes Has Compression in Place as Prescribed: Yes Pain Present Now: No Electronic Signature(s) Signed: 07/02/2021 4:15:39 PM By: [name], BSN Entered By: Zenaida Deed on 07/02/2021 12:50:38 -------------------------------------------------------------------------------- Compression Therapy Details Patient Name: Date of Service: [name] 07/02/2021 12:30 PM Medical Record Number: [id number] Patient Account Number: [id number] Date of Birth/Sex: Treating RN: 1976-06-19 (45 y.o. [name] Primary Care Zamire Whitehurst: [name] Other Clinician: Referring Sherran Margolis: Treating [name]/Extender: Darleen Crocker in Treatment: 21 Compression Therapy Performed for  Wound Assessment: Wound #1 Left Ankle Performed By: Clinician Zenaida Deed, RN Compression Type: Three Layer Post Procedure Diagnosis Same as Pre-procedure Electronic Signature(s) Signed: 07/02/2021 4:15:39 PM By: [name], BSN Entered By: Zenaida Deed on 07/02/2021 13:31:05 -------------------------------------------------------------------------------- Encounter Discharge Information Details Patient Name: Date of Service: [name] 07/02/2021 12:30 PM Medical Record Number: [id number] Patient Account Number: [id number] Date of Birth/Sex: Treating RN: October 22, 1975 (45 y.o. [name] Primary Care [name]: [name] Other Clinician: Referring [name]: Treating Aleighya Mcanelly/Extender: Darleen Crocker in Treatment: 21 Encounter Discharge Information Items Discharge Condition: Stable Ambulatory Status: Ambulatory Discharge Destination: Home Transportation: Private Auto Accompanied By: self Schedule Follow-up Appointment: Yes Clinical Summary of Care: Patient Declined Electronic Signature(s) Signed: 07/02/2021 4:15:39 PM By: [name], BSN Entered By: Zenaida Deed on 07/02/2021 13:33:59 -------------------------------------------------------------------------------- Lower Extremity Assessment Details Patient Name: Date of Service: [name] 07/02/2021 12:30 PM Medical Record Number: [id number] Patient Account Number: [id number] Date of Birth/Sex: Treating RN: Dec 26, 1975 (45 y.o. [name] Primary Care Quintez Maselli: [name] Other Clinician: Referring Nonna Renninger: Treating Nhyira Leano/Extender: Darleen Crocker in Treatment: 21 Edema Assessment Assessed: [name]: No] [name]: No] Edema: [Left: N] [Right: o] Calf Left: Right: Point of Measurement: 28 cm From Medial Instep 31 cm Ankle Left: Right: Point of Measurement: 8 cm From Medial Instep 22 cm Vascular  Assessment Pulses: Dorsalis Pedis Palpable: [Left:Yes] Electronic Signature(s) Signed: 07/02/2021 4:15:39 PM By: [name], BSN Entered By: Zenaida Deed on 07/02/2021 13:00:10 -------------------------------------------------------------------------------- Multi Wound Chart Details Patient Name: Date of Service: [name] 07/02/2021 12:30 PM Medical Record Number: [id number] Patient Account Number: [id number] Date of Birth/Sex: Treating RN: [birth year] (45 y.o. [name] Primary Care Hulbert Branscome: [name] Other Clinician: Referring Ysabelle Goodroe: Treating [name]/Extender: Darleen Crocker in Treatment: 21 Vital Signs Height(in): Pulse(bpm): 63 Weight(lbs): Blood Pressure(mmHg): 126/77 Body Mass Index(BMI): Temperature(F): 97.6 Respiratory Rate(breaths/min): 18 Photos: [N/A:N/A] Left Ankle N/A N/A Wound  Location: Trauma N/A N/A Wounding Event: Trauma, Other N/A N/A Primary Etiology: 10/14/2020 N/A N/A Date Acquired: 81 N/A N/A Weeks of Treatment: Open N/A N/A Wound Status: 3.7x7.2x0.2 N/A N/A Measurements L x W x D (cm) 20.923 N/A N/A A (cm) : rea 4.185 N/A N/A Volume (cm) : 66.00% N/A N/A % Reduction in A rea: 83.00% N/A N/A % Reduction in Volume: Full Thickness Without Exposed N/A N/A Classification: Support Structures Large N/A N/A Exudate Amount: Serosanguineous N/A N/A Exudate Type: red, brown N/A N/A Exudate Color: Distinct, outline attached N/A N/A Wound Margin: Large (67-100%) N/A N/A Granulation Amount: Red, Pink N/A N/A Granulation Quality: Small (1-33%) N/A N/A Necrotic Amount: Fat Layer (Subcutaneous Tissue): Yes N/A N/A Exposed Structures: Fascia: No Tendon: No Muscle: No Joint: No Bone: No Small (1-33%) N/A N/A Epithelialization: Treatment Notes Electronic Signature(s) Signed: 07/02/2021 4:34:24 PM By: Baltazar Najjar MD Signed: 07/02/2021 5:22:04 PM By: Shawn Stall RN,  BSN Entered By: Baltazar Najjar on 07/02/2021 13:18:22 -------------------------------------------------------------------------------- Multi-Disciplinary Care Plan Details Patient Name: Date of Service: [name] 07/02/2021 12:30 PM Medical Record Number: [id number] Patient Account Number: [id number] Date of Birth/Sex: Treating RN: [birth year] (45 y.o. [name] Primary Care Morry Veiga: [name] Other Clinician: Referring Orena Cavazos: Treating Cherye Gaertner/Extender: Darleen Crocker in Treatment: 21 Multidisciplinary Care Plan reviewed with physician Active Inactive Wound/Skin Impairment Nursing Diagnoses: Knowledge deficit related to ulceration/compromised skin integrity Goals: Patient/caregiver will verbalize understanding of skin care regimen Date Initiated: 02/05/2021 Target Resolution Date: 07/29/2021 Goal Status: Active Interventions: Assess patient/caregiver ability to obtain necessary supplies Assess patient/caregiver ability to perform ulcer/skin care regimen upon admission and as needed Provide education on ulcer and skin care Treatment Activities: Skin care regimen initiated : 02/05/2021 Topical wound management initiated : 02/05/2021 Notes: 03/31/21: Wound care regimen ongoing, target date extended. 04/21/21: Wound care ongoing, through interpreter patient states he is doing fine with his dressing changes. Electronic Signature(s) Signed: 07/02/2021 4:15:39 PM By: [name], BSN Entered By: Zenaida Deed on 07/02/2021 13:12:35 -------------------------------------------------------------------------------- Pain Assessment Details Patient Name: Date of Service: [name] 07/02/2021 12:30 PM Medical Record Number: [id number] Patient Account Number: [id number] Date of Birth/Sex: Treating RN: 03-13-1976 (45 y.o. [name] Primary Care Hilliard Borges: [name] Other Clinician: Referring Mishawn Didion: Treating  Maleea Camilo/Extender: Darleen Crocker in Treatment: 21 Active Problems Location of Pain Severity and Description of Pain Patient Has Paino No Site Locations Rate the pain. Rate the pain. Current Pain Level: 0 Pain Management and Medication Current Pain Management: Electronic Signature(s) Signed: 07/02/2021 4:15:39 PM By: [name], BSN Entered By: Zenaida Deed on 07/02/2021 12:59:41 -------------------------------------------------------------------------------- Patient/Caregiver Education Details Patient Name: Date of Service: [name] 12/1/2022andnbsp12:30 PM Medical Record Number: [id number] Patient Account Number: [id number] Date of Birth/Gender: Treating RN: Oct 02, 1975 (45 y.o. [name] Primary Care Physician: [name] Other Clinician: Referring Physician: Treating Physician/Extender: Darleen Crocker in Treatment: 21 Education Assessment Education Provided To: Patient Education Topics Provided Venous: Methods: Explain/Verbal Responses: Reinforcements needed, State content correctly Wound/Skin Impairment: Methods: Explain/Verbal Responses: Reinforcements needed, State content correctly Electronic Signature(s) Signed: 07/02/2021 4:15:39 PM By: [name], BSN Entered By: Zenaida Deed on 07/02/2021 13:21:10 -------------------------------------------------------------------------------- Wound Assessment Details Patient Name: Date of Service: [name] 07/02/2021 12:30 PM Medical Record Number: [id number] Patient Account Number: [id number] Date of Birth/Sex: Treating RN: [birth year] (45 y.o. [name] Primary Care Shaya Altamura: [name] Other Clinician: Referring Dub Maclellan:  Treating Linnette Panella/Extender: Darleen Crocker in Treatment: 21 Wound Status Wound Number: 1 Primary Etiology: Trauma, Other Wound Location: Left Ankle Wound  Status: Open Wounding Event: Trauma Date Acquired: 10/14/2020 Weeks Of Treatment: 21 Clustered Wound: No Photos Wound Measurements Length: (cm) 3.7 Width: (cm) 7.2 Depth: (cm) 0.2 Area: (cm) 20.923 Volume: (cm) 4.185 % Reduction in Area: 66% % Reduction in Volume: 83% Epithelialization: Small (1-33%) Tunneling: No Undermining: No Wound Description Classification: Full Thickness Without Exposed Support Structures Wound Margin: Distinct, outline attached Exudate Amount: Large Exudate Type: Serosanguineous Exudate Color: red, brown Foul Odor After Cleansing: No Slough/Fibrino Yes Wound Bed Granulation Amount: Large (67-100%) Exposed Structure Granulation Quality: Red, Pink Fascia Exposed: No Necrotic Amount: Small (1-33%) Fat Layer (Subcutaneous Tissue) Exposed: Yes Necrotic Quality: Adherent Slough Tendon Exposed: No Muscle Exposed: No Joint Exposed: No Bone Exposed: No Treatment Notes Wound #1 (Ankle) Wound Laterality: Left Cleanser Soap and Water Discharge Instruction: May shower and wash wound with dial antibacterial soap and water prior to dressing change. Wound Cleanser Discharge Instruction: Cleanse the wound with wound cleanser prior to applying a clean dressing using gauze sponges, not tissue or cotton balls. Peri-Wound Care Ketoconazole Cream 2% Discharge Instruction: Apply Ketoconazole to lateral part of foot. Triamcinolone 15 (g) Discharge Instruction: T wound and leg o Zinc Oxide Ointment 30g tube Discharge Instruction: Apply Zinc Oxide to periwound with each dressing change Sween Lotion (Moisturizing lotion) Discharge Instruction: Apply moisturizing lotion as directed Topical Mupirocin Ointment Discharge Instruction: Apply Mupirocin (Bactroban) under the calcium alginate Ag as instructed Primary Dressing Hydrofera Blue Classic Foam, 4x4 in Discharge Instruction: Moisten with saline prior to applying to wound bed Secondary Dressing ABD Pad,  8x10 Discharge Instruction: Apply over primary dressing as directed. Zetuvit Plus 4x8 in Discharge Instruction: Apply over primary dressing as directed. CarboFLEX Odor Control Dressing, 4x4 in Discharge Instruction: Apply over primary dressing as directed. Secured With Compression Wrap ThreePress (3 layer compression wrap) Discharge Instruction: Apply three layer compression as directed. Compression Stockings Add-Ons Electronic Signature(s) Signed: 07/02/2021 4:15:39 PM By: [name], BSN Entered By: Zenaida Deed on 07/02/2021 13:08:34 -------------------------------------------------------------------------------- Vitals Details Patient Name: Date of Service: [name] 07/02/2021 12:30 PM Medical Record Number: [id number] Patient Account Number: [id number] Date of Birth/Sex: Treating RN: April 11, 1976 (45 y.o. [name] Primary Care [employer]: [name] Other Clinician: Referring Daxon Kyne: Treating Elizeo Rodriques/Extender: Darleen Crocker in Treatment: 21 Vital Signs Time Taken: 12:50 Temperature (F): 97.6 Pulse (bpm): 63 Respiratory Rate (breaths/min): 18 Blood Pressure (mmHg): 126/77 Reference Range: 80 - 120 mg / dl Electronic Signature(s) Signed: 07/02/2021 4:15:39 PM By: [name], BSN Entered By: Zenaida Deed on 07/02/2021 12:51:01

## 2021-07-09 ENCOUNTER — Encounter (HOSPITAL_BASED_OUTPATIENT_CLINIC_OR_DEPARTMENT_OTHER): Payer: Medicaid Other | Admitting: Internal Medicine

## 2021-07-09 ENCOUNTER — Other Ambulatory Visit: Payer: Self-pay

## 2021-07-09 DIAGNOSIS — I87332 Chronic venous hypertension (idiopathic) with ulcer and inflammation of left lower extremity: Secondary | ICD-10-CM | POA: Diagnosis not present

## 2021-07-09 DIAGNOSIS — L97322 Non-pressure chronic ulcer of left ankle with fat layer exposed: Secondary | ICD-10-CM | POA: Diagnosis not present

## 2021-07-09 DIAGNOSIS — L97328 Non-pressure chronic ulcer of left ankle with other specified severity: Secondary | ICD-10-CM | POA: Diagnosis not present

## 2021-07-09 NOTE — Progress Notes (Signed)
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 been using silver alginate under compression with sit to fit and CarboFlex He has been to see infectious disease Dr. Luciana Axe. Noted that he has been on Bactrim and clarithromycin for possible mycobacterial or other indolent infection. I am not sure if he is still taking antibiotics but these are listed as being discontinued and by infectious disease 10/27; our intake nurse reported large amount of drainage today more than usual. We have been using silver alginate. He still has not seen vein and vascular about the reflux studies I am not sure what the issue is here. He is very itchy under the wound on the left lateral foot The patient comes into clinic concerned that the 1 year of Medicaid that apparently was assigned to him when he entered the [place]. This is now coming to an end. I told him that I thought the best thing to do is the county social services i.e. [facility] social services I am not sure how else to help him with this. We of course will not discharge him which I think was his concern.  He does have an appointment with Dr. Myra Gianotti on 11/7 with regards to the reflux studies. 11/8; the patient saw Dr. Myra Gianotti who noted mild at the saphenofemoral junction on the right but he did not feel that the vein was pathologic and he did not feel he would benefit from laser ablation. Suggested continuing to focus on wound care. We are using silver alginate with  Bactroban 11/17; wound looks about the same. Still a fair amount of drainage here. Although the wound is coming in surface area it still a deep wound full-thickness. I am using silver alginate with Bactroban He really applied for Medicaid. Wondering about a skin graft. I am uncertain about that right now because of the drainage 12/1; wound is measuring slightly smaller in width. Surface of this looks better. Changed him to [facility] still using topical Bactroban 12/8; no major change in dimensions although the surface looks excellent we have been using Bactroban and covering Hydrofera Blue. Considering application for TheraSkin if it is available through his version of Medicaid Objective Constitutional Sitting or standing Blood Pressure is within target range for patient.. Pulse regular and within target range for patient.Marland Kitchen Respirations regular, non-labored and within target range.. Temperature is normal and within the target range for the patient.Marland Kitchen Appears in no distress. Vitals Time Taken: 10:40 AM, Temperature: 97.5 F, Pulse: 54 bpm, Respiratory Rate: 20 breaths/min, Blood Pressure: 133/85 mmHg. General Notes: Wound exam; heel pulses are palpable his edema control is adequate. He has some rims of epithelialization but the surface of the wound looks excellent yet we are still not making much surface area progress. There is no evidence of Integumentary (Hair, Skin) Wound #1 status is Open. Original cause of wound was Trauma. The date acquired was: 10/14/2020. The wound has been in treatment 22 weeks. The wound is located on the  Left Ankle. The wound measures 4cm length x 8cm width x 0.3cm depth; 25.133cm^2 area and 7.54cm^3 volume. There is Fat Layer (Subcutaneous Tissue) exposed. There is no tunneling or undermining noted. There is a large amount of serosanguineous drainage noted. The wound margin is distinct with the outline attached to the wound base. There is large (67-100%) red, pink granulation within the wound bed. There is a small (1-33%) amount of necrotic tissue within the wound bed including Adherent Slough. 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 There was a Three Layer Compression Therapy Procedure by Shawn Stall, RN. Post procedure Diagnosis Wound #1: Same as Pre-Procedure Plan Follow-up Appointments: Return Appointment in 1 week. - with Dr. Leanord Hawking - Interpreter Required Thursday Bathing/ Shower/ Hygiene: May shower with protection but do not get wound dressing(s) wet. Edema Control - Lymphedema / SCD / Other: Elevate legs to the level of the heart or above for 30 minutes daily and/or when sitting, a frequency of: - 3-4 times a day throughout the day. Avoid standing for long periods of time. Exercise regularly Compression stocking or Garment 20-30 mm/Hg pressure to: - Pt. to order from [employer]. We will give leg measurements!! Off-Loading: Open toe surgical shoe to: - left foot WOUND #1: - Ankle Wound Laterality: Left Cleanser: Soap and Water 1 x Per Week/ Discharge Instructions: May shower and wash wound with dial antibacterial soap and water prior to dressing change. Cleanser: Wound Cleanser 1 x Per Week/ Discharge Instructions: Cleanse the wound with wound cleanser prior to applying a clean dressing using gauze sponges, not  tissue or cotton balls. Peri-Wound Care: Ketoconazole Cream 2% 1 x Per Week/ Discharge Instructions: Apply Ketoconazole to lateral part of foot. Peri-Wound Care: Triamcinolone 15 (g) 1 x Per Week/ Discharge Instructions: T wound and leg o Peri-Wound Care: Zinc Oxide Ointment 30g  tube 1 x Per Week/ Discharge Instructions: Apply Zinc Oxide to periwound with each dressing change Peri-Wound Care: Sween Lotion (Moisturizing lotion) 1 x Per Week/ Discharge Instructions: Apply moisturizing lotion as directed Topical: Mupirocin Ointment 1 x Per Week/ Discharge Instructions: Apply Mupirocin (Bactroban) under the calcium alginate Ag as instructed Prim Dressing: Hydrofera Blue Classic Foam, 4x4 in 1 x Per Week/ ary Discharge Instructions: Moisten with saline prior to applying to wound bed Secondary Dressing: ABD Pad, 8x10 1 x Per Week/ Discharge Instructions: Apply over primary dressing as directed. Secondary Dressing: Zetuvit Plus 4x8 in 1 x Per Week/ Discharge Instructions: Apply over primary dressing as directed. Secondary Dressing: CarboFLEX Odor Control Dressing, 4x4 in 1 x Per Week/ Discharge Instructions: Apply over primary dressing as directed. Com pression Wrap: ThreePress (3 layer compression wrap) 1 x Per Week/ Discharge Instructions: Apply three layer compression as directed. 1. I am still using the Bactroban and Hydrofera Blue 2. If we do not make any surface area improvements look to TheraSkin through his version of Medicaid early in the new year. 3. The other optimistic thing is our intake nurse reported less drainage which may make an advanced treatment product more feasible Electronic Signature(s) Signed: 07/09/2021 4:05:05 PM By: Baltazar Najjar MD Entered By: Baltazar Najjar on 07/09/2021 11:35:06 -------------------------------------------------------------------------------- SuperBill Details Patient Name: Date of Service: [name] 07/09/2021 Medical Record  Number: [id number] Patient Account Number: [id number] Date of Birth/Sex: Treating RN: 1976/03/21 (44 y.o. [name] Primary Care Provider: [name] Other Clinician: Referring Provider: Treating Provider/Extender: Darleen Crocker in Treatment: 22 Diagnosis Coding ICD-10 Codes Code Description [phone] Non-pressure chronic ulcer of left ankle with other specified severity I87.332 Chronic venous hypertension (idiopathic) with ulcer and inflammation of left lower extremity Facility Procedures CPT4 Code: [id number] Description: (Facility Use Only) [id number] - APPLY MULTLAY COMPRS LWR LT LEG Modifier: Quantity: 1 Physician Procedures Electronic Signature(s) Signed: 07/09/2021 4:05:05 PM By: Baltazar Najjar MD Entered By: Baltazar Najjar on 07/09/2021 11:35:20

## 2021-07-09 NOTE — Progress Notes (Signed)
[name], [name] ([id number]) Visit Report for 07/09/2021 Arrival Information Details Patient Name: Date of Service: [name] [place] NIE 07/09/2021 10:45 [name] M Medical Record Number: [id number] Patient Account Number: [id number] Date of Birth/Sex: Treating RN: [birth year] (45 y.o. Harlon Flor, Millard.Loa Primary Care Gurfateh Mcclain: [name] Other Clinician: Referring Eniya Cannady: Treating Moncerrat Burnstein/Extender: Darleen Crocker in Treatment: 22 Visit Information History Since Last Visit Added or deleted any medications: No Patient Arrived: Ambulatory Any new allergies or adverse reactions: No Arrival Time: 10:38 Had [name] fall or experienced change in No Accompanied By: interpreter activities of daily living that may affect Transfer Assistance: None risk of falls: Patient Identification Verified: Yes Signs or symptoms of abuse/neglect since last visito No Secondary Verification Process Completed: Yes Hospitalized since last visit: No Patient Requires Transmission-Based Precautions: No Implantable device outside of the clinic excluding No Patient Has Alerts: No cellular tissue based products placed in the center since last visit: Has Dressing in Place as Prescribed: Yes Has Compression in Place as Prescribed: Yes Pain Present Now: No Electronic Signature(s) Signed: 07/09/2021 3:55:54 PM By: Shawn Stall RN, BSN Entered By: Shawn Stall on 07/09/2021 10:39:06 -------------------------------------------------------------------------------- Compression Therapy Details Patient Name: Date of Service: [name] 07/09/2021 10:45 [name] M Medical Record Number: [id number] Patient Account Number: [id number] Date of Birth/Sex: Treating RN: 23-Mar-1976 (45 y.o. [name] Primary Care Zoye Chandra: [name] Other Clinician: Referring Nylen Creque: Treating Syretta Kochel/Extender: Darleen Crocker in Treatment: 22 Compression Therapy Performed for Wound  Assessment: Wound #1 Left Ankle Performed By: Clinician Shawn Stall, RN Compression Type: Three Layer Post Procedure Diagnosis Same as Pre-procedure Electronic Signature(s) Signed: 07/09/2021 3:55:54 PM By: Shawn Stall RN, BSN Entered By: Shawn Stall on 07/09/2021 10:51:48 -------------------------------------------------------------------------------- Encounter Discharge Information Details Patient Name: Date of Service: [name] 07/09/2021 10:45 [name] M Medical Record Number: [id number] Patient Account Number: [id number] Date of Birth/Sex: Treating RN: September 10, 1975 (45 y.o. [name] Primary Care Shaney Deckman: [name] Other Clinician: Referring Briea Mcenery: Treating Perle Gibbon/Extender: Darleen Crocker in Treatment: 22 Encounter Discharge Information Items Discharge Condition: Stable Ambulatory Status: Ambulatory Discharge Destination: Home Transportation: Private Auto Accompanied By: interpreter Schedule Follow-up Appointment: Yes Clinical Summary of Care: Electronic Signature(s) Signed: 07/09/2021 3:55:54 PM By: Shawn Stall RN, BSN Entered By: Shawn Stall on 07/09/2021 10:54:43 -------------------------------------------------------------------------------- Lower Extremity Assessment Details Patient Name: Date of Service: [name] 07/09/2021 10:45 [name] M Medical Record Number: [id number] Patient Account Number: [id number] Date of Birth/Sex: Treating RN: Oct 12, 1975 (45 y.o. [name] Primary Care Camay Pedigo: [name] Other Clinician: Referring Uziah Sorter: Treating Maryclare Nydam/Extender: Darleen Crocker in Treatment: 22 Edema Assessment Assessed: [name]: No] [name]: No] Edema: [Left: N] [Right: o] Calf Left: Right: Point of Measurement: 28 cm From Medial Instep 30.5 cm Ankle Left: Right: Point of Measurement: 8 cm From Medial Instep 21.5 cm Vascular Assessment Pulses: Dorsalis  Pedis Palpable: [Left:Yes] Electronic Signature(s) Signed: 07/09/2021 3:55:54 PM By: Shawn Stall RN, BSN Entered By: Shawn Stall on 07/09/2021 10:48:24 -------------------------------------------------------------------------------- Multi Wound Chart Details Patient Name: Date of Service: [name] 07/09/2021 10:45 [name] M Medical Record Number: [id number] Patient Account Number: [id number] Date of Birth/Sex: Treating RN: 1975/08/27 (45 y.o. [name] Primary Care Cayci Mcnabb: [name] Other Clinician: Referring Stepen Prins: Treating Miyani Cronic/Extender: Darleen Crocker in Treatment: 22 Vital Signs Height(in): Pulse(bpm): 54 Weight(lbs): Blood Pressure(mmHg): 133/85 Body Mass Index(BMI): Temperature(F): 97.5 Respiratory Rate(breaths/min): 20 Photos: [[name]:[name]] Left Ankle  [name] [name] Wound Location: Trauma [name] [name] Wounding Event: Trauma, Other [name] [name] Primary Etiology: 10/14/2020 [name] [name] Date Acquired: 61 [name] [name] Weeks of Treatment: Open [name] [name] Wound Status: 4x8x0.3 [name] [name] Measurements L x W x D (cm) 25.133 [name] [name] [name] (cm) : rea 7.54 [name] [name] Volume (cm) : 59.20% [name] [name] % Reduction in [name] rea: 69.40% [name] [name] % Reduction in Volume: Full Thickness Without Exposed [name] [name] Classification: Support Structures Large [name] [name] Exudate Amount: Serosanguineous [name] [name] Exudate Type: red, brown [name] [name] Exudate Color: Distinct, outline attached [name] [name] Wound Margin: Large (67-100%) [name] [name] Granulation Amount: Red, Pink [name] [name] Granulation Quality: Small (1-33%) [name] [name] Necrotic Amount: Fat Layer (Subcutaneous Tissue): Yes [name] [name] Exposed Structures: Fascia: No Tendon: No Muscle: No Joint: No Bone: No Small (1-33%) [name] [name] Epithelialization: Compression Therapy [name] [name] Procedures Performed: Treatment Notes Wound #1 (Ankle) Wound Laterality: Left Cleanser Soap and Water Discharge Instruction: May shower and wash wound  with dial antibacterial soap and water prior to dressing change. Wound Cleanser Discharge Instruction: Cleanse the wound with wound cleanser prior to applying [name] clean dressing using gauze sponges, not tissue or cotton balls. Peri-Wound Care Ketoconazole Cream 2% Discharge Instruction: Apply Ketoconazole to lateral part of foot. Triamcinolone 15 (g) Discharge Instruction: T wound and leg o Zinc Oxide Ointment 30g tube Discharge Instruction: Apply Zinc Oxide to periwound with each dressing change Sween Lotion (Moisturizing lotion) Discharge Instruction: Apply moisturizing lotion as directed Topical Mupirocin Ointment Discharge Instruction: Apply Mupirocin (Bactroban) under the calcium alginate Ag as instructed Primary Dressing Hydrofera Blue Classic Foam, 4x4 in Discharge Instruction: Moisten with saline prior to applying to wound bed Secondary Dressing ABD Pad, 8x10 Discharge Instruction: Apply over primary dressing as directed. Zetuvit Plus 4x8 in Discharge Instruction: Apply over primary dressing as directed. CarboFLEX Odor Control Dressing, 4x4 in Discharge Instruction: Apply over primary dressing as directed. Secured With Compression Wrap ThreePress (3 layer compression wrap) Discharge Instruction: Apply three layer compression as directed. Compression Stockings Add-Ons Electronic Signature(s) Signed: 07/09/2021 3:55:54 PM By: Shawn Stall RN, BSN Signed: 07/09/2021 4:05:05 PM By: Baltazar Najjar MD Entered By: Baltazar Najjar on 07/09/2021 11:31:17 -------------------------------------------------------------------------------- Multi-Disciplinary Care Plan Details Patient Name: Date of Service: [name] 07/09/2021 10:45 [name] M Medical Record Number: [id number] Patient Account Number: [id number] Date of Birth/Sex: Treating RN: October 22, 1975 (45 y.o. [name] Primary Care Dericka Ostenson: [name] Other Clinician: Referring Shenicka Sunderlin: Treating  Reed Eifert/Extender: Darleen Crocker in Treatment: 22 Multidisciplinary Care Plan reviewed with physician Active Inactive Wound/Skin Impairment Nursing Diagnoses: Knowledge deficit related to ulceration/compromised skin integrity Goals: Patient/caregiver will verbalize understanding of skin care regimen Date Initiated: 02/05/2021 Target Resolution Date: 07/29/2021 Goal Status: Active Interventions: Assess patient/caregiver ability to obtain necessary supplies Assess patient/caregiver ability to perform ulcer/skin care regimen upon admission and as needed Provide education on ulcer and skin care Treatment Activities: Skin care regimen initiated : 02/05/2021 Topical wound management initiated : 02/05/2021 Notes: 03/31/21: Wound care regimen ongoing, target date extended. 04/21/21: Wound care ongoing, through interpreter patient states he is doing fine with his dressing changes. Electronic Signature(s) Signed: 07/09/2021 3:55:54 PM By: Shawn Stall RN, BSN Entered By: Shawn Stall on 07/09/2021 10:49:14 -------------------------------------------------------------------------------- Pain Assessment Details Patient Name: Date of Service: [name] 07/09/2021 10:45 [name] M Medical Record Number: [id number] Patient Account Number: [id number] Date of Birth/Sex: Treating RN: [birth year] (45 y.o. [name] Primary Care Joselito Fieldhouse: [name] Other Clinician: Referring Vestal Markin:  Treating Marshal Schrecengost/Extender: Darleen Crocker in Treatment: 22 Active Problems Location of Pain Severity and Description of Pain Patient Has Paino No Site Locations Rate the pain. Current Pain Level: 0 Pain Management and Medication Current Pain Management: Medication: No Cold Application: No Rest: No Massage: No Activity: No T.E.N.S.: No Heat Application: No Leg drop or elevation: No Is the Current Pain Management Adequate: Adequate How does your  wound impact your activities of daily livingo Sleep: No Bathing: No Appetite: No Relationship With Others: No Bladder Continence: No Emotions: No Bowel Continence: No Work: No Toileting: No Drive: No Dressing: No Hobbies: No Psychologist, prison and probation services) Signed: 07/09/2021 3:55:54 PM By: Shawn Stall RN, BSN Entered By: Shawn Stall on 07/09/2021 10:39:18 -------------------------------------------------------------------------------- Patient/Caregiver Education Details Patient Name: Date of Service: [name] 12/8/2022andnbsp10:45 [name] M Medical Record Number: [id number] Patient Account Number: [id number] Date of Birth/Gender: Treating RN: 02-28-1976 (45 y.o. [name] Primary Care Physician: [name] Other Clinician: Referring Physician: Treating Physician/Extender: Darleen Crocker in Treatment: 22 Education Assessment Education Provided To: Patient Education Topics Provided Wound/Skin Impairment: Handouts: Skin Care Do's and Dont's Methods: Explain/Verbal Responses: Reinforcements needed Electronic Signature(s) Signed: 07/09/2021 3:55:54 PM By: Shawn Stall RN, BSN Entered By: Shawn Stall on 07/09/2021 10:49:26 -------------------------------------------------------------------------------- Wound Assessment Details Patient Name: Date of Service: [name] 07/09/2021 10:45 [name] M Medical Record Number: [id number] Patient Account Number: [id number] Date of Birth/Sex: Treating RN: 1975-12-19 (45 y.o. Harlon Flor, Millard.Loa Primary Care Aleisha Paone: [name] Other Clinician: Referring Gerrianne Aydelott: Treating Lelania Bia/Extender: Darleen Crocker in Treatment: 22 Wound Status Wound Number: 1 Primary Etiology: Trauma, Other Wound Location: Left Ankle Wound Status: Open Wounding Event: Trauma Date Acquired: 10/14/2020 Weeks Of Treatment: 22 Clustered Wound: No Photos Photo Uploaded By: Shawn Stall on  07/09/2021 11:27:05 Wound Measurements Length: (cm) 4 Width: (cm) 8 Depth: (cm) 0.3 Area: (cm) 25.133 Volume: (cm) 7.54 % Reduction in Area: 59.2% % Reduction in Volume: 69.4% Epithelialization: Small (1-33%) Tunneling: No Undermining: No Wound Description Classification: Full Thickness Without Exposed Support Structures Wound Margin: Distinct, outline attached Exudate Amount: Large Exudate Type: Serosanguineous Exudate Color: red, brown Foul Odor After Cleansing: No Slough/Fibrino Yes Wound Bed Granulation Amount: Large (67-100%) Exposed Structure Granulation Quality: Red, Pink Fascia Exposed: No Necrotic Amount: Small (1-33%) Fat Layer (Subcutaneous Tissue) Exposed: Yes Necrotic Quality: Adherent Slough Tendon Exposed: No Muscle Exposed: No Joint Exposed: No Bone Exposed: No Treatment Notes Wound #1 (Ankle) Wound Laterality: Left Cleanser Soap and Water Discharge Instruction: May shower and wash wound with dial antibacterial soap and water prior to dressing change. Wound Cleanser Discharge Instruction: Cleanse the wound with wound cleanser prior to applying [name] clean dressing using gauze sponges, not tissue or cotton balls. Peri-Wound Care Ketoconazole Cream 2% Discharge Instruction: Apply Ketoconazole to lateral part of foot. Triamcinolone 15 (g) Discharge Instruction: T wound and leg o Zinc Oxide Ointment 30g tube Discharge Instruction: Apply Zinc Oxide to periwound with each dressing change Sween Lotion (Moisturizing lotion) Discharge Instruction: Apply moisturizing lotion as directed Topical Mupirocin Ointment Discharge Instruction: Apply Mupirocin (Bactroban) under the calcium alginate Ag as instructed Primary Dressing Hydrofera Blue Classic Foam, 4x4 in Discharge Instruction: Moisten with saline prior to applying to wound bed Secondary Dressing ABD Pad, 8x10 Discharge Instruction: Apply over primary dressing as directed. Zetuvit Plus 4x8 in Discharge  Instruction: Apply over primary dressing as directed. CarboFLEX Odor Control Dressing, 4x4 in Discharge Instruction: Apply over primary dressing as directed. Secured  With Compression Wrap ThreePress (3 layer compression wrap) Discharge Instruction: Apply three layer compression as directed. Compression Stockings Add-Ons Electronic Signature(s) Signed: 07/09/2021 3:55:54 PM By: Shawn Stall RN, BSN Entered By: Shawn Stall on 07/09/2021 10:48:42 -------------------------------------------------------------------------------- Vitals Details Patient Name: Date of Service: [name], [name] [name] 07/09/2021 10:45 [name] M Medical Record Number: [id number] Patient Account Number: [id number] Date of Birth/Sex: Treating RN: 06/02/1976 (45 y.o. [name] Primary Care Tamaya Pun: [name] Other Clinician: Referring Kaily Wragg: Treating Ruben Mahler/Extender: Darleen Crocker in Treatment: 22 Vital Signs Time Taken: 10:40 Temperature (F): 97.5 Pulse (bpm): 54 Respiratory Rate (breaths/min): 20 Blood Pressure (mmHg): 133/85 Reference Range: 80 - 120 mg / dl Electronic Signature(s) Signed: 07/09/2021 3:55:54 PM By: Shawn Stall RN, BSN Entered By: Shawn Stall on 07/09/2021 10:40:35

## 2021-07-16 ENCOUNTER — Encounter (HOSPITAL_BASED_OUTPATIENT_CLINIC_OR_DEPARTMENT_OTHER): Payer: Medicaid Other | Admitting: Internal Medicine

## 2021-07-16 ENCOUNTER — Other Ambulatory Visit: Payer: Self-pay

## 2021-07-16 DIAGNOSIS — E11622 Type 2 diabetes mellitus with other skin ulcer: Secondary | ICD-10-CM | POA: Diagnosis not present

## 2021-07-16 DIAGNOSIS — I890 Lymphedema, not elsewhere classified: Secondary | ICD-10-CM | POA: Diagnosis not present

## 2021-07-16 DIAGNOSIS — L97328 Non-pressure chronic ulcer of left ankle with other specified severity: Secondary | ICD-10-CM | POA: Diagnosis not present

## 2021-07-16 DIAGNOSIS — L97322 Non-pressure chronic ulcer of left ankle with fat layer exposed: Secondary | ICD-10-CM | POA: Diagnosis not present

## 2021-07-16 DIAGNOSIS — I87332 Chronic venous hypertension (idiopathic) with ulcer and inflammation of left lower extremity: Secondary | ICD-10-CM | POA: Diagnosis not present

## 2021-07-16 NOTE — Progress Notes (Signed)
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 phone A little more drainage reported by our nurses. We have been using silver alginate under compression with sit to fit and CarboFlex He has been to see infectious disease Dr. Luciana Axe. Noted that he has been on Bactrim and clarithromycin for possible mycobacterial or other indolent infection. I am not sure if he is still taking antibiotics but these are listed as being discontinued and by infectious disease 10/27; our intake nurse reported large amount of drainage today more than usual. We have been using silver alginate. He still has not seen vein and vascular about the reflux studies I am not sure what the issue is here. He is very itchy under the wound on the left lateral foot The patient comes into clinic concerned that the 1 year of Medicaid that apparently was assigned to him when he entered the [place]. This is now coming to an end. I told him that I thought the best thing to do is the county social services i.e. [facility] social services I am not sure how else to help him with this. We of course will not  discharge him which I think was his concern. He does have an appointment with Dr. Myra Gianotti on 11/7 with regards to the reflux studies. 11/8; the patient saw Dr. Myra Gianotti who noted mild at the saphenofemoral junction on the right but he did not feel that the vein was pathologic and he did not feel he would benefit from laser ablation. Suggested continuing to focus on wound care. We are using silver alginate with Bactroban 11/17; wound looks about the  same. Still a fair amount of drainage here. Although the wound is coming in surface area it still a deep wound full-thickness. I am using silver alginate with Bactroban He really applied for Medicaid. Wondering about a skin graft. I am uncertain about that right now because of the drainage 12/1; wound is measuring slightly smaller in width. Surface of this looks better. Changed him to [facility] still using topical Bactroban 12/8; no major change in dimensions although the surface looks excellent we have been using Bactroban and covering Hydrofera Blue. Considering application for TheraSkin if it is available through his version of Medicaid 12/15; nice healthy appearing wound advancing epithelialization Objective Constitutional Patient is hypertensive.. Pulse regular and within target range for patient.Marland Kitchen Respirations regular, non-labored and within target range.. Temperature is normal and within the target range for the patient.Marland Kitchen Appears in no distress. Vitals Time Taken: 12:55 PM, Temperature: 98.1 F, Pulse: 70 bpm, Respiratory Rate: 20 breaths/min, Blood Pressure: 144/58 mmHg. General Notes: Wound exam; his pulses feel normal. His edema control is adequate. Surface of the wound looks like healthy granulation. In the middle of this he has an advancing [place] of endothelialization making a heart-shaped wound. No evidence of surrounding infection Integumentary (Hair, Skin) Wound #1 status is Open. Original cause of wound was Trauma. The date  acquired was: 10/14/2020. The wound has been in treatment 23 weeks. The wound is located on the Left Ankle. The wound measures 3.3cm length x 7.5cm width x 0.3cm depth; 19.439cm^2 area and 5.832cm^3 volume. There is Fat Layer (Subcutaneous Tissue) exposed. There is no tunneling or undermining noted. There is a large amount of serosanguineous drainage noted. The wound margin is distinct with the outline attached to the wound base. There is large (67-100%) red, pink granulation within the wound bed. There is a small (1-33%) amount of necrotic tissue within the wound bed including Adherent Slough. 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 There was a Three Layer Compression Therapy Procedure by Shawn Stall, RN. Post procedure Diagnosis Wound #1: Same as Pre-Procedure Plan Follow-up Appointments: Return Appointment in 1 week. - with Dr. Leanord Hawking - Interpreter Required Thursday Bathing/ Shower/ Hygiene: May shower with protection but do not get wound dressing(s) wet. Edema Control - Lymphedema / SCD / Other: Elevate legs to the level of the heart or above for 30 minutes daily and/or when sitting, a frequency of: - 3-4 times a day throughout the day. Avoid standing for long periods of time. Exercise regularly Compression stocking or Garment 20-30 mm/Hg pressure to: - Pt. to order from [employer]. We will give leg measurements!! Off-Loading: Open toe surgical shoe to: - left foot WOUND #1: - Ankle Wound Laterality: Left Cleanser: Soap and Water 1 x Per Week/ Discharge Instructions: May shower and wash wound with dial antibacterial soap and water prior to dressing change. Cleanser: Wound Cleanser 1 x Per Week/ Discharge Instructions:  Cleanse the wound with wound cleanser prior to applying a clean dressing using gauze sponges, not tissue or cotton balls. Peri-Wound Care: Ketoconazole Cream 2% 1 x Per Week/ Discharge Instructions: Apply Ketoconazole to lateral part of foot. Peri-Wound Care: Triamcinolone 15 (g) 1 x Per Week/ Discharge Instructions: T wound and leg o Peri-Wound Care: Zinc Oxide Ointment 30g tube 1 x Per Week/ Discharge Instructions:  Apply Zinc Oxide to periwound with each dressing change Peri-Wound Care: Sween Lotion (Moisturizing lotion) 1 x Per Week/ Discharge Instructions: Apply moisturizing lotion as directed Topical: Mupirocin Ointment 1 x Per Week/ Discharge Instructions: Apply Mupirocin (Bactroban) under the calcium alginate Ag as instructed Prim Dressing: Hydrofera Blue Classic Foam, 4x4 in 1 x Per Week/ ary Discharge Instructions: Moisten with saline prior to applying to wound bed Secondary Dressing: ABD Pad, 8x10 1 x Per Week/ Discharge Instructions: Apply over primary dressing as directed. Secondary Dressing: Zetuvit Plus 4x8 in 1 x Per Week/ Discharge Instructions: Apply over primary dressing as directed. Secondary Dressing: CarboFLEX Odor Control Dressing, 4x4 in 1 x Per Week/ Discharge Instructions: Apply over primary dressing as directed. Com pression Wrap: ThreePress (3 layer compression wrap) 1 x Per Week/ Discharge Instructions: Apply three layer compression as directed. 1. I see no good reason to change the current dressing which is Bactroban and Hydrofera Blue under compression 2. I will consider running TheraSkin through his version of Medicaid in the new year 3. No evidence of infection Electronic Signature(s) Signed: 07/16/2021 4:39:12 PM By: Baltazar Najjar MD Entered By: Baltazar Najjar on 07/16/2021 13:28:16 -------------------------------------------------------------------------------- SuperBill Details Patient Name: Date of Service: [name]  07/16/2021 Medical Record Number: [id number] Patient Account Number: [id number] Date of Birth/Sex: Treating RN: 1976/06/22 (45 y.o. [name] Primary Care Provider: [name] Other Clinician: Referring Provider: Treating Provider/Extender: Darleen Crocker in Treatment: 23 Diagnosis Coding ICD-10 Codes Code Description [phone] Non-pressure chronic ulcer of left ankle with other specified severity I87.332 Chronic venous hypertension (idiopathic) with ulcer and inflammation of left lower extremity Facility Procedures CPT4 Code: [id number] Description: (Facility Use Only) [phone] - APPLY MULTLAY COMPRS LWR LT LEG Modifier: Quantity: 1 Physician Procedures : CPT4 Code Description Modifier [id number] - WC PHYS LEVEL 3 - EST PT ICD-10 Diagnosis Description L97.328 Non-pressure chronic ulcer of left ankle with other specified severity I87.332 Chronic venous hypertension (idiopathic) with ulcer and  inflammation of left lower extremity Quantity: 1 Electronic Signature(s) Signed: 07/16/2021 4:39:12 PM By: Baltazar Najjar MD Entered By: Baltazar Najjar on 07/16/2021 13:28:31

## 2021-07-17 NOTE — Progress Notes (Signed)
COLLIER, BOHNET ([id number]) Visit Report for 07/16/2021 Arrival Information Details Patient Name: Date of Service: [name] [place] NIE 07/16/2021 12:30 PM Medical Record Number: [id number] Patient Account Number: [id number] Date of Birth/Sex: Treating RN: Dec 16, 1975 (45 y.o. [name], Millard.Loa Primary Care [name]: [name] Other Clinician: Referring [name]: Treating [name]/Extender: [name] in Treatment: 23 Visit Information History Since Last Visit Added or deleted any medications: No Patient Arrived: Ambulatory Any new allergies or adverse reactions: No Arrival Time: 12:53 Had a fall or experienced change in No Accompanied By: translator activities of daily living that may affect Transfer Assistance: None risk of falls: Patient Identification Verified: Yes Signs or symptoms of abuse/neglect since last visito No Secondary Verification Process Completed: Yes Hospitalized since last visit: No Patient Requires Transmission-Based Precautions: No Implantable device outside of the clinic excluding No Patient Has Alerts: No cellular tissue based products placed in the center since last visit: Has Dressing in Place as Prescribed: Yes Pain Present Now: No Electronic Signature(s) Signed: 07/17/2021 8:55:23 AM By: [name] Entered By: [name] on 07/16/2021 12:54:56 -------------------------------------------------------------------------------- Compression Therapy Details Patient Name: Date of Service: [name] 07/16/2021 12:30 PM Medical Record Number: [id number] Patient Account Number: [id number] Date of Birth/Sex: Treating RN: 07/12/1976 (45 y.o. [name] Primary Care [name]: [name] Other Clinician: Referring [name]: Treating [name]/Extender: [name] in Treatment: 23 Compression Therapy Performed for Wound Assessment: Wound #1 Left Ankle Performed By:  Clinician Shawn Stall, RN Compression Type: Three Layer Post Procedure Diagnosis Same as Pre-procedure Electronic Signature(s) Signed: 07/16/2021 5:30:53 PM By: Shawn Stall RN, BSN Entered By: Shawn Stall on 07/16/2021 13:22:03 -------------------------------------------------------------------------------- Encounter Discharge Information Details Patient Name: Date of Service: [name] 07/16/2021 12:30 PM Medical Record Number: [id number] Patient Account Number: [id number] Date of Birth/Sex: Treating RN: 08/12/1975 (45 y.o. [name] Primary Care Londen Bok: [name] Other Clinician: Referring Sahalie Beth: Treating [name]/Extender: [name] in Treatment: 23 Encounter Discharge Information Items Discharge Condition: Stable Ambulatory Status: Ambulatory Discharge Destination: Home Transportation: Private Auto Accompanied By: interpreter Schedule Follow-up Appointment: Yes Clinical Summary of Care: Electronic Signature(s) Signed: 07/16/2021 5:30:53 PM By: Shawn Stall RN, BSN Entered By: Shawn Stall on 07/16/2021 13:24:52 -------------------------------------------------------------------------------- Lower Extremity Assessment Details Patient Name: Date of Service: [name] 07/16/2021 12:30 PM Medical Record Number: [id number] Patient Account Number: [id number] Date of Birth/Sex: Treating RN: [birth year] (45 y.o. [name] Primary Care [name]: [name] Other Clinician: Referring [name]: Treating [name]/Extender: [name] in Treatment: 23 Edema Assessment Assessed: [name]: Yes] [name]: No] Edema: [Left: N] [Right: o] Calf Left: Right: Point of Measurement: 28 cm From Medial Instep 32 cm Ankle Left: Right: Point of Measurement: 8 cm From Medial Instep 20 cm Vascular Assessment Pulses: Dorsalis Pedis Palpable: [Left:Yes] Electronic  Signature(s) Signed: 07/16/2021 5:30:53 PM By: Shawn Stall RN, BSN Entered By: Shawn Stall on 07/16/2021 13:18:29 -------------------------------------------------------------------------------- Multi Wound Chart Details Patient Name: Date of Service: [name] 07/16/2021 12:30 PM Medical Record Number: [id number] Patient Account Number: [id number] Date of Birth/Sex: Treating RN: [birth year] (45 y.o. [name] Primary Care [name]: [name] Other Clinician: Referring [name]: Treating [name]/Extender: [name] in Treatment: 23 Vital Signs Height(in): Pulse(bpm): 70 Weight(lbs): Blood Pressure(mmHg): 144/58 Body Mass Index(BMI): Temperature(F): 98.1 Respiratory Rate(breaths/min): 20 Photos: [1:No Photos Left Ankle] [N/A:N/A N/A] Wound Location: [1:Trauma] [N/A:N/A] Wounding Event: [1:Trauma, Other] [N/A:N/A] Primary Etiology: [  1:10/14/2020] [N/A:N/A] Date Acquired: [1:23] [N/A:N/A] Weeks of Treatment: [1:Open] [N/A:N/A] Wound Status: [1:3.3x7.5x0.3] [N/A:N/A] Measurements L x W x D (cm) [1:19.439] [N/A:N/A] A (cm) : rea [1:5.832] [N/A:N/A] Volume (cm) : [1:68.40%] [N/A:N/A] % Reduction in A rea: [1:76.30%] [N/A:N/A] % Reduction in Volume: [1:Full Thickness Without Exposed] [N/A:N/A] Classification: [1:Support Structures Large] [N/A:N/A] Exudate Amount: [1:Serosanguineous] [N/A:N/A] Exudate Type: [1:red, brown] [N/A:N/A] Exudate Color: [1:Distinct, outline attached] [N/A:N/A] Wound Margin: [1:Large (67-100%)] [N/A:N/A] Granulation Amount: [1:Red, Pink] [N/A:N/A] Granulation Quality: [1:Small (1-33%)] [N/A:N/A] Necrotic Amount: [1:Fat Layer (Subcutaneous Tissue): Yes N/A] Exposed Structures: [1:Fascia: No Tendon: No Muscle: No Joint: No Bone: No Medium (34-66%)] [N/A:N/A] Epithelialization: [1:Compression Therapy] [N/A:N/A] Treatment Notes Wound #1 (Ankle) Wound Laterality: Left Cleanser Soap and  Water Discharge Instruction: May shower and wash wound with dial antibacterial soap and water prior to dressing change. Wound Cleanser Discharge Instruction: Cleanse the wound with wound cleanser prior to applying a clean dressing using gauze sponges, not tissue or cotton balls. Peri-Wound Care Ketoconazole Cream 2% Discharge Instruction: Apply Ketoconazole to lateral part of foot. Triamcinolone 15 (g) Discharge Instruction: T wound and leg o Zinc Oxide Ointment 30g tube Discharge Instruction: Apply Zinc Oxide to periwound with each dressing change Sween Lotion (Moisturizing lotion) Discharge Instruction: Apply moisturizing lotion as directed Topical Mupirocin Ointment Discharge Instruction: Apply Mupirocin (Bactroban) under the calcium alginate Ag as instructed Primary Dressing Hydrofera Blue Classic Foam, 4x4 in Discharge Instruction: Moisten with saline prior to applying to wound bed Secondary Dressing ABD Pad, 8x10 Discharge Instruction: Apply over primary dressing as directed. Zetuvit Plus 4x8 in Discharge Instruction: Apply over primary dressing as directed. CarboFLEX Odor Control Dressing, 4x4 in Discharge Instruction: Apply over primary dressing as directed. Secured With Compression Wrap ThreePress (3 layer compression wrap) Discharge Instruction: Apply three layer compression as directed. Compression Stockings Add-Ons Electronic Signature(s) Signed: 07/16/2021 4:39:12 PM By: Baltazar Najjar MD Signed: 07/16/2021 5:30:53 PM By: Shawn Stall RN, BSN Entered By: Baltazar Najjar on 07/16/2021 13:25:37 -------------------------------------------------------------------------------- Multi-Disciplinary Care Plan Details Patient Name: Date of Service: [name] 07/16/2021 12:30 PM Medical Record Number: [id number] Patient Account Number: [id number] Date of Birth/Sex: Treating RN: June 22, 1976 (45 y.o. [name] Primary Care Sentoria Brent: [name] Other Clinician: Referring Green Quincy: Treating Martavion Couper/Extender: [name] in Treatment: 23 Multidisciplinary Care Plan reviewed with physician Active Inactive Wound/Skin Impairment Nursing Diagnoses: Knowledge deficit related to ulceration/compromised skin integrity Goals: Patient/caregiver will verbalize understanding of skin care regimen Date Initiated: 02/05/2021 Target Resolution Date: 07/29/2021 Goal Status: Active Interventions: Assess patient/caregiver ability to obtain necessary supplies Assess patient/caregiver ability to perform ulcer/skin care regimen upon admission and as needed Provide education on ulcer and skin care Treatment Activities: Skin care regimen initiated : 02/05/2021 Topical wound management initiated : 02/05/2021 Notes: 03/31/21: Wound care regimen ongoing, target date extended. 04/21/21: Wound care ongoing, through interpreter patient states he is doing fine with his dressing changes. Electronic Signature(s) Signed: 07/16/2021 5:30:53 PM By: Shawn Stall RN, BSN Entered By: Shawn Stall on 07/16/2021 13:19:11 -------------------------------------------------------------------------------- Pain Assessment Details Patient Name: Date of Service: [name] 07/16/2021 12:30 PM Medical Record Number: [id number] Patient Account Number: [id number] Date of Birth/Sex: Treating RN: 1975-11-19 (45 y.o. [name] Primary Care Darwin Guastella: [name] Other Clinician: Referring Barbarann Kelly: Treating Ennis Delpozo/Extender: [name] in Treatment: 23 Active Problems Location of Pain Severity and Description of Pain Patient Has Paino No Site Locations Pain Management and Medication Current Pain Management: Electronic Signature(s) Signed: 07/16/2021 5:30:53  PM By: Shawn Stall RN, BSN Signed: 07/17/2021 8:55:23 AM By: [name] Entered By: [name] on 07/16/2021  12:55:24 -------------------------------------------------------------------------------- Patient/Caregiver Education Details Patient Name: Date of Service: [name] 12/15/2022andnbsp12:30 PM Medical Record Number: [id number] Patient Account Number: [id number] Date of Birth/Gender: Treating RN: [birth year] (45 y.o. [name] Primary Care Physician: [name] Other Clinician: Referring Physician: Treating Physician/Extender: [name] in Treatment: 23 Education Assessment Education Provided To: Patient Education Topics Provided Wound/Skin Impairment: Handouts: Skin Care Do's and Dont's Methods: Explain/Verbal Responses: Reinforcements needed Electronic Signature(s) Signed: 07/16/2021 5:30:53 PM By: Shawn Stall RN, BSN Entered By: Shawn Stall on 07/16/2021 13:19:32 -------------------------------------------------------------------------------- Wound Assessment Details Patient Name: Date of Service: [name] 07/16/2021 12:30 PM Medical Record Number: [id number] Patient Account Number: [id number] Date of Birth/Sex: Treating RN: [birth year] (45 y.o. [name], Millard.Loa Primary Care Kodie Pick: [name] Other Clinician: Referring Shalisa Mcquade: Treating Lyana Asbill/Extender: [name] in Treatment: 23 Wound Status Wound Number: 1 Primary Etiology: Trauma, Other Wound Location: Left Ankle Wound Status: Open Wounding Event: Trauma Date Acquired: 10/14/2020 Weeks Of Treatment: 23 Clustered Wound: No Wound Measurements Length: (cm) 3.3 Width: (cm) 7.5 Depth: (cm) 0.3 Area: (cm) 19.439 Volume: (cm) 5.832 % Reduction in Area: 68.4% % Reduction in Volume: 76.3% Epithelialization: Medium (34-66%) Tunneling: No Undermining: No Wound Description Classification: Full Thickness Without Exposed Support Structures Wound Margin: Distinct, outline attached Exudate Amount: Large Exudate  Type: Serosanguineous Exudate Color: red, brown Foul Odor After Cleansing: No Slough/Fibrino Yes Wound Bed Granulation Amount: Large (67-100%) Exposed Structure Granulation Quality: Red, Pink Fascia Exposed: No Necrotic Amount: Small (1-33%) Fat Layer (Subcutaneous Tissue) Exposed: Yes Necrotic Quality: Adherent Slough Tendon Exposed: No Muscle Exposed: No Joint Exposed: No Bone Exposed: No Treatment Notes Wound #1 (Ankle) Wound Laterality: Left Cleanser Soap and Water Discharge Instruction: May shower and wash wound with dial antibacterial soap and water prior to dressing change. Wound Cleanser Discharge Instruction: Cleanse the wound with wound cleanser prior to applying a clean dressing using gauze sponges, not tissue or cotton balls. Peri-Wound Care Ketoconazole Cream 2% Discharge Instruction: Apply Ketoconazole to lateral part of foot. Triamcinolone 15 (g) Discharge Instruction: T wound and leg o Zinc Oxide Ointment 30g tube Discharge Instruction: Apply Zinc Oxide to periwound with each dressing change Sween Lotion (Moisturizing lotion) Discharge Instruction: Apply moisturizing lotion as directed Topical Mupirocin Ointment Discharge Instruction: Apply Mupirocin (Bactroban) under the calcium alginate Ag as instructed Primary Dressing Hydrofera Blue Classic Foam, 4x4 in Discharge Instruction: Moisten with saline prior to applying to wound bed Secondary Dressing ABD Pad, 8x10 Discharge Instruction: Apply over primary dressing as directed. Zetuvit Plus 4x8 in Discharge Instruction: Apply over primary dressing as directed. CarboFLEX Odor Control Dressing, 4x4 in Discharge Instruction: Apply over primary dressing as directed. Secured With Compression Wrap ThreePress (3 layer compression wrap) Discharge Instruction: Apply three layer compression as directed. Compression Stockings Add-Ons Electronic Signature(s) Signed: 07/16/2021 5:30:53 PM By: Shawn Stall RN,  BSN Entered By: Shawn Stall on 07/16/2021 13:18:46 -------------------------------------------------------------------------------- Vitals Details Patient Name: Date of Service: [name], [name] 07/16/2021 12:30 PM Medical Record Number: [id number] Patient Account Number: [id number] Date of Birth/Sex: Treating RN: 1976/07/03 (44 y.o. [name] Primary Care Mckensi Redinger: [name] Other Clinician: Referring Pheonix Clinkscale: Treating Sartaj Hoskin/Extender: [name] in Treatment: 23 Vital Signs Time Taken: 12:55 Temperature (F): 98.1 Pulse (bpm): 70 Respiratory Rate (breaths/min): 20 Blood Pressure (mmHg): 144/58 Reference Range: 80 - 120  mg / dl Electronic Signature(s) Signed: 07/17/2021 8:55:23 AM By: [name] Entered By: [name] on 07/16/2021 12:55:17

## 2021-07-23 ENCOUNTER — Other Ambulatory Visit: Payer: Self-pay

## 2021-07-23 ENCOUNTER — Encounter (HOSPITAL_BASED_OUTPATIENT_CLINIC_OR_DEPARTMENT_OTHER): Payer: Medicaid Other | Admitting: Internal Medicine

## 2021-07-23 DIAGNOSIS — L97328 Non-pressure chronic ulcer of left ankle with other specified severity: Secondary | ICD-10-CM | POA: Diagnosis not present

## 2021-07-23 DIAGNOSIS — L97322 Non-pressure chronic ulcer of left ankle with fat layer exposed: Secondary | ICD-10-CM | POA: Diagnosis not present

## 2021-07-23 DIAGNOSIS — I87332 Chronic venous hypertension (idiopathic) with ulcer and inflammation of left lower extremity: Secondary | ICD-10-CM | POA: Diagnosis not present

## 2021-07-23 NOTE — Progress Notes (Signed)
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 interpreter on the phone he is not complaining of pain 10/20; wound surface covered in fibrinous debris. This is largely on the lateral part of his foot. We interviewed him through a interpreter on the phone A little more drainage reported by our nurses. We have been using silver alginate under compression with sit to fit and CarboFlex He has been to see infectious disease Dr. Luciana Axe. Noted that he has been on Bactrim and clarithromycin for possible mycobacterial or other indolent infection. I am not sure if he is still taking antibiotics but these are listed as being discontinued and by infectious disease 10/27; our intake nurse reported large amount of drainage today more than usual. We have been using silver alginate. He still has not seen vein and vascular about the reflux studies I am not sure what the issue is here. He is very itchy under the wound on the left lateral foot The patient comes into clinic concerned that the 1 year of Medicaid that apparently was assigned to him when he entered the [place]. This is now coming to an end. I told  him that I thought the best thing to do is the county social services i.e. [facility] social services I am not sure how else to help him with this. We of course will not discharge him which I think was his concern. He does have an appointment with Dr. Myra Gianotti on 11/7 with regards to the reflux studies. 11/8; the patient saw Dr. Myra Gianotti who noted mild at the saphenofemoral junction on the right but he did not feel that the vein  was pathologic and he did not feel he would benefit from laser ablation. Suggested continuing to focus on wound care. We are using silver alginate with Bactroban 11/17; wound looks about the same. Still a fair amount of drainage here. Although the wound is coming in surface area it still a deep wound full-thickness. I am using silver alginate with Bactroban He really applied for Medicaid. Wondering about a skin graft. I am uncertain about that right now because of the drainage 12/1; wound is measuring slightly smaller in width. Surface of this looks better. Changed him to [facility] still using topical Bactroban 12/8; no major change in dimensions although the surface looks excellent we have been using Bactroban and covering Hydrofera Blue. Considering application for TheraSkin if it is available through his version of Medicaid 12/15; nice healthy appearing wound advancing epithelialization 12/22; improvement in surface area using Bactroban under Hydrofera Blue. Originally a difficult large wound likely secondary to chronic venous insufficiency Objective Constitutional Sitting or standing Blood Pressure is within target range for patient.. Pulse regular and within target range for patient.Marland Kitchen Respirations regular, non-labored and within target range.. Temperature is normal and within the target range for the patient.Marland Kitchen Appears in no distress. Vitals Time Taken: 12:30 PM, Temperature: 98.1 F, Pulse: 62 bpm, Respiratory Rate: 20 breaths/min, Blood Pressure: 121/74  mmHg. General Notes: Wound exam; pulses feel normal. His edema control is adequate. Surface of the wound looks actually quite good for the most part. Some surface slough. I did not debride this today. He has rims of advancing epithelialization Integumentary (Hair, Skin) Wound #1 status is Open. Original cause of wound was Trauma. The date acquired was: 10/14/2020. The wound has been in treatment 24 weeks. The wound is located on the Left Ankle. The wound measures 3cm length x 7cm width x 0.1cm depth; 16.493cm^2 area and 1.649cm^3 volume. There is Fat Layer (Subcutaneous Tissue) exposed. There is no tunneling or undermining noted. There is a medium amount of serosanguineous drainage noted. The wound margin is distinct with the outline attached to the wound base. There is large (67-100%) red, pink granulation within the wound bed. There is a small (1-33%) amount of necrotic tissue within the wound bed including Adherent Slough. 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 There was a Three Layer Compression Therapy Procedure by Shawn Stall, RN. Post procedure Diagnosis Wound #1: Same as Pre-Procedure Plan Follow-up Appointments: Return Appointment in 2 weeks. - with Dr. Leanord Hawking - Interpreter Required Thursday Nurse Visit: - Next week 07/30/2021 Bathing/ Shower/ Hygiene: May shower with protection but do not get wound dressing(s) wet. Edema Control - Lymphedema / SCD / Other: Elevate legs to the level of the heart or above for 30 minutes daily and/or when sitting, a frequency of: - 3-4 times a day throughout the day. Avoid standing for long periods of time. Exercise regularly Compression stocking or Garment 20-30 mm/Hg pressure to: - Pt.  to order from [employer]. We will give leg measurements!! Off-Loading: Open toe surgical shoe to: - left foot WOUND #1: - Ankle Wound Laterality: Left Cleanser: Soap and Water 1 x Per Week/ Discharge Instructions: May shower and wash wound with dial antibacterial soap and water prior to dressing change. Cleanser: Wound Cleanser 1 x Per Week/ Discharge Instructions: Cleanse the wound  with wound cleanser prior to applying a clean dressing using gauze sponges, not tissue or cotton balls. Peri-Wound Care: Ketoconazole Cream 2% 1 x Per Week/ Discharge Instructions: Apply Ketoconazole to lateral part of foot. Peri-Wound Care: Triamcinolone 15 (g) 1 x Per Week/ Discharge Instructions: T wound and leg o Peri-Wound Care: Zinc Oxide Ointment 30g tube 1 x Per Week/ Discharge Instructions: Apply Zinc Oxide to periwound with each dressing change Peri-Wound Care: Sween Lotion (Moisturizing lotion) 1 x Per Week/ Discharge Instructions: Apply moisturizing lotion as directed Topical: Mupirocin Ointment 1 x Per Week/ Discharge Instructions: Apply Mupirocin (Bactroban) under the calcium alginate Ag as instructed Prim Dressing: Hydrofera Blue Classic Foam, 4x4 in 1 x Per Week/ ary Discharge Instructions: Moisten with saline prior to applying to wound bed Secondary Dressing: ABD Pad, 8x10 1 x Per Week/ Discharge Instructions: Apply over primary dressing as directed. Secondary Dressing: Zetuvit Plus 4x8 in 1 x Per Week/ Discharge Instructions: Apply over primary dressing as directed. Secondary Dressing: CarboFLEX Odor Control Dressing, 4x4 in 1 x Per Week/ Discharge Instructions: Apply over primary dressing as directed. Com pression Wrap: ThreePress (3 layer compression wrap) 1 x Per Week/ Discharge Instructions: Apply three layer compression as directed. 1. We continued with Bactroban and Hydrofera Blue under compression 2. We will apply for a TheraSkin into the new year to see if his version of  Medicaid covers this. 3. Today I did not feel this needed debridement although we will be vigilant about this at each visit. 4. He will come back for a nurse visit next week I will see him again in 2 weeks. Electronic Signature(s) Signed: 07/23/2021 3:43:54 PM By: Baltazar Najjar MD Entered By: Baltazar Najjar on 07/23/2021 12:52:25 -------------------------------------------------------------------------------- SuperBill Details Patient Name: Date of Service: [name] 07/23/2021 Medical Record Number: [id number] Patient Account Number: [id number] Date of Birth/Sex: Treating RN: [birth year] (45 y.o. [name] Primary Care Provider: [name] Other Clinician: Referring Provider: Treating Provider/Extender: Darleen Crocker in Treatment: 24 Diagnosis Coding ICD-10 Codes Code Description [phone] Non-pressure chronic ulcer of left ankle with other specified severity I87.332 Chronic venous hypertension (idiopathic) with ulcer and inflammation of left lower extremity Facility Procedures Physician Procedures : CPT4 Code Description Modifier [id number] [phone] - WC PHYS LEVEL 2 - EST PT ICD-10 Diagnosis Description L97.328 Non-pressure chronic ulcer of left ankle with other specified severity I87.332 Chronic venous hypertension (idiopathic) with ulcer and  inflammation of left lower extremity Quantity: 1 Electronic Signature(s) Signed: 07/23/2021 3:43:54 PM By: Baltazar Najjar MD Entered By: Baltazar Najjar on 07/23/2021 12:52:44

## 2021-07-23 NOTE — Progress Notes (Signed)
[name], [name] ([id number]) Visit Report for 07/23/2021 Arrival Information Details Patient Name: Date of Service: [name] [name] NIE 07/23/2021 12:30 PM Medical Record Number: [id number] Patient Account Number: [id number] Date of Birth/Sex: Treating RN: 16-Dec-1975 (45 y.o. [name], Millard.Loa Primary Care [name]: Shelby [name] Other Clinician: Referring Sharni Negron: Treating [name]/Extender: Darleen Crocker in Treatment: 24 Visit Information History Since Last Visit Added or deleted any medications: No Patient Arrived: Ambulatory Any new allergies or adverse reactions: No Arrival Time: 12:33 Had a fall or experienced change in No Accompanied By: interpreter activities of daily living that may affect Transfer Assistance: None risk of falls: Patient Identification Verified: Yes Signs or symptoms of abuse/neglect since last visito No Secondary Verification Process Completed: Yes Hospitalized since last visit: No Patient Requires Transmission-Based Precautions: No Implantable device outside of the clinic excluding No Patient Has Alerts: No cellular tissue based products placed in the center since last visit: Has Dressing in Place as Prescribed: Yes Has Compression in Place as Prescribed: Yes Pain Present Now: No Electronic Signature(s) Signed: 07/23/2021 5:08:09 PM By: [name] Stall RN, BSN Entered By: [name] on 07/23/2021 12:34:22 -------------------------------------------------------------------------------- Compression Therapy Details Patient Name: Date of Service: [name] 07/23/2021 12:30 PM Medical Record Number: [id number] Patient Account Number: [id number] Date of Birth/Sex: Treating RN: 1976-01-30 (45 y.o. [name] Primary Care [name]: Shelby [name] Other Clinician: Referring Ellorie Kindall: Treating Willa Brocks/Extender: Darleen Crocker in Treatment: 24 Compression Therapy Performed for Wound  Assessment: Wound #1 Left Ankle Performed By: Clinician [name] Stall, RN Compression Type: Three Layer Post Procedure Diagnosis Same as Pre-procedure Electronic Signature(s) Signed: 07/23/2021 5:08:09 PM By: [name] Stall RN, BSN Entered By: [name] on 07/23/2021 12:44:05 -------------------------------------------------------------------------------- Encounter Discharge Information Details Patient Name: Date of Service: [name] 07/23/2021 12:30 PM Medical Record Number: [id number] Patient Account Number: [id number] Date of Birth/Sex: Treating RN: August 25, 1975 (45 y.o. [name] Primary Care [name]: Shelby [name] Other Clinician: Referring Leasia Swann: Treating [name]/Extender: Darleen Crocker in Treatment: 24 Encounter Discharge Information Items Discharge Condition: Stable Ambulatory Status: Ambulatory Discharge Destination: Home Transportation: Private Auto Accompanied By: interpreter Schedule Follow-up Appointment: Yes Clinical Summary of Care: Electronic Signature(s) Signed: 07/23/2021 5:08:09 PM By: [name] Stall RN, BSN Entered By: [name] on 07/23/2021 12:48:28 -------------------------------------------------------------------------------- Lower Extremity Assessment Details Patient Name: Date of Service: [name] 07/23/2021 12:30 PM Medical Record Number: [id number] Patient Account Number: [id number] Date of Birth/Sex: Treating RN: [birth year] (45 y.o. [name] Primary Care Jolaine Fryberger: Shelby [name] Other Clinician: Referring Pamela Maddy: Treating [name]/Extender: Darleen Crocker in Treatment: 24 Edema Assessment Assessed: [name]: Yes] [name]: No] Edema: [Left: N] [Right: o] Calf Left: Right: Point of Measurement: 28 cm From Medial Instep 31 cm Ankle Left: Right: Point of Measurement: 8 cm From Medial Instep 23 cm Vascular Assessment Pulses: Dorsalis  Pedis Palpable: [Left:Yes] Electronic Signature(s) Signed: 07/23/2021 5:08:09 PM By: [name] Stall RN, BSN Entered By: [name] on 07/23/2021 12:36:07 -------------------------------------------------------------------------------- Multi Wound Chart Details Patient Name: Date of Service: [name] 07/23/2021 12:30 PM Medical Record Number: [id number] Patient Account Number: [id number] Date of Birth/Sex: Treating RN: 12-16-1975 (45 y.o. [name] Primary Care [name]: Shelby [name] Other Clinician: Referring Brenin Heidelberger: Treating Kamel Haven/Extender: Darleen Crocker in Treatment: 24 Vital Signs Height(in): Pulse(bpm): 62 Weight(lbs): Blood Pressure(mmHg): 121/74 Body Mass Index(BMI): Temperature(F): 98.1 Respiratory Rate(breaths/min): 20 Photos: [N/A:N/A] Left Ankle N/A N/A Wound Location: Trauma  N/A N/A Wounding Event: Trauma, Other N/A N/A Primary Etiology: 10/14/2020 N/A N/A Date Acquired: 50 N/A N/A Weeks of Treatment: Open N/A N/A Wound Status: 3x7x0.1 N/A N/A Measurements L x W x D (cm) 16.493 N/A N/A A (cm) : rea 1.649 N/A N/A Volume (cm) : 73.20% N/A N/A % Reduction in A rea: 93.30% N/A N/A % Reduction in Volume: Full Thickness Without Exposed N/A N/A Classification: Support Structures Medium N/A N/A Exudate Amount: Serosanguineous N/A N/A Exudate Type: red, brown N/A N/A Exudate Color: Distinct, outline attached N/A N/A Wound Margin: Large (67-100%) N/A N/A Granulation Amount: Red, Pink N/A N/A Granulation Quality: Small (1-33%) N/A N/A Necrotic Amount: Fat Layer (Subcutaneous Tissue): Yes N/A N/A Exposed Structures: Fascia: No Tendon: No Muscle: No Joint: No Bone: No Medium (34-66%) N/A N/A Epithelialization: Compression Therapy N/A N/A Procedures Performed: Treatment Notes Wound #1 (Ankle) Wound Laterality: Left Cleanser Soap and Water Discharge Instruction: May shower and wash  wound with dial antibacterial soap and water prior to dressing change. Wound Cleanser Discharge Instruction: Cleanse the wound with wound cleanser prior to applying a clean dressing using gauze sponges, not tissue or cotton balls. Peri-Wound Care Ketoconazole Cream 2% Discharge Instruction: Apply Ketoconazole to lateral part of foot. Triamcinolone 15 (g) Discharge Instruction: T wound and leg o Zinc Oxide Ointment 30g tube Discharge Instruction: Apply Zinc Oxide to periwound with each dressing change Sween Lotion (Moisturizing lotion) Discharge Instruction: Apply moisturizing lotion as directed Topical Mupirocin Ointment Discharge Instruction: Apply Mupirocin (Bactroban) under the calcium alginate Ag as instructed Primary Dressing Hydrofera Blue Classic Foam, 4x4 in Discharge Instruction: Moisten with saline prior to applying to wound bed Secondary Dressing ABD Pad, 8x10 Discharge Instruction: Apply over primary dressing as directed. Zetuvit Plus 4x8 in Discharge Instruction: Apply over primary dressing as directed. CarboFLEX Odor Control Dressing, 4x4 in Discharge Instruction: Apply over primary dressing as directed. Secured With Compression Wrap ThreePress (3 layer compression wrap) Discharge Instruction: Apply three layer compression as directed. Compression Stockings Add-Ons Electronic Signature(s) Signed: 07/23/2021 3:43:54 PM By: Baltazar Najjar MD Signed: 07/23/2021 5:08:09 PM By: [name] Stall RN, BSN Entered By: Baltazar Najjar on 07/23/2021 12:49:20 -------------------------------------------------------------------------------- Multi-Disciplinary Care Plan Details Patient Name: Date of Service: [name] 07/23/2021 12:30 PM Medical Record Number: [id number] Patient Account Number: [id number] Date of Birth/Sex: Treating RN: 1975-10-03 (45 y.o. [name] Primary Care Aubriauna Riner: Shelby [name] Other Clinician: Referring Thomos Domine: Treating  Trevis Eden/Extender: Darleen Crocker in Treatment: 24 Multidisciplinary Care Plan reviewed with physician Active Inactive Wound/Skin Impairment Nursing Diagnoses: Knowledge deficit related to ulceration/compromised skin integrity Goals: Patient/caregiver will verbalize understanding of skin care regimen Date Initiated: 02/05/2021 Target Resolution Date: 08/28/2021 Goal Status: Active Interventions: Assess patient/caregiver ability to obtain necessary supplies Assess patient/caregiver ability to perform ulcer/skin care regimen upon admission and as needed Provide education on ulcer and skin care Treatment Activities: Skin care regimen initiated : 02/05/2021 Topical wound management initiated : 02/05/2021 Notes: 03/31/21: Wound care regimen ongoing, target date extended. 04/21/21: Wound care ongoing, through interpreter patient states he is doing fine with his dressing changes. Electronic Signature(s) Signed: 07/23/2021 5:08:09 PM By: [name] Stall RN, BSN Entered By: [name] on 07/23/2021 12:38:40 -------------------------------------------------------------------------------- Pain Assessment Details Patient Name: Date of Service: [name] 07/23/2021 12:30 PM Medical Record Number: [id number] Patient Account Number: [id number] Date of Birth/Sex: Treating RN: 1976-07-31 (45 y.o. [name] Primary Care Madelene Kaatz: Shelby [name] Other Clinician: Referring Morgon Pamer: Treating Shayli Altemose/Extender: Darleen Crocker  in Treatment: 24 Active Problems Location of Pain Severity and Description of Pain Patient Has Paino No Site Locations Rate the pain. Current Pain Level: 0 Pain Management and Medication Current Pain Management: Medication: No Cold Application: No Rest: No Massage: No Activity: No T.E.N.S.: No Heat Application: No Leg drop or elevation: No Is the Current Pain Management Adequate: Adequate How does your  wound impact your activities of daily livingo Sleep: No Bathing: No Appetite: No Relationship With Others: No Bladder Continence: No Emotions: No Bowel Continence: No Work: No Toileting: No Drive: No Dressing: No Hobbies: No Psychologist, prison and probation services) Signed: 07/23/2021 5:08:09 PM By: [name] Stall RN, BSN Entered By: [name] on 07/23/2021 12:35:00 -------------------------------------------------------------------------------- Patient/Caregiver Education Details Patient Name: Date of Service: [name] 12/22/2022andnbsp12:30 PM Medical Record Number: [id number] Patient Account Number: [id number] Date of Birth/Gender: Treating RN: 26-Oct-1975 (45 y.o. [name] Primary Care Physician: Shelby [name] Other Clinician: Referring Physician: Treating Physician/Extender: Darleen Crocker in Treatment: 24 Education Assessment Education Provided To: Patient Education Topics Provided Wound/Skin Impairment: Handouts: Skin Care Do's and Dont's Methods: Explain/Verbal Responses: Reinforcements needed Electronic Signature(s) Signed: 07/23/2021 5:08:09 PM By: [name] Stall RN, BSN Entered By: [name] on 07/23/2021 12:38:53 -------------------------------------------------------------------------------- Wound Assessment Details Patient Name: Date of Service: [name] 07/23/2021 12:30 PM Medical Record Number: [id number] Patient Account Number: [id number] Date of Birth/Sex: Treating RN: 1976/03/17 (45 y.o. [name], Millard.Loa Primary Care Corneshia Hines: Shelby [name] Other Clinician: Referring Marcena Dias: Treating Davisha Linthicum/Extender: Darleen Crocker in Treatment: 24 Wound Status Wound Number: 1 Primary Etiology: Trauma, Other Wound Location: Left Ankle Wound Status: Open Wounding Event: Trauma Date Acquired: 10/14/2020 Weeks Of Treatment: 24 Clustered Wound: No Photos Wound Measurements Length: (cm)  3 Width: (cm) 7 Depth: (cm) 0.1 Area: (cm) 16.493 Volume: (cm) 1.649 % Reduction in Area: 73.2% % Reduction in Volume: 93.3% Epithelialization: Medium (34-66%) Tunneling: No Undermining: No Wound Description Classification: Full Thickness Without Exposed Support Structures Wound Margin: Distinct, outline attached Exudate Amount: Medium Exudate Type: Serosanguineous Exudate Color: red, brown Foul Odor After Cleansing: No Slough/Fibrino Yes Wound Bed Granulation Amount: Large (67-100%) Exposed Structure Granulation Quality: Red, Pink Fascia Exposed: No Necrotic Amount: Small (1-33%) Fat Layer (Subcutaneous Tissue) Exposed: Yes Necrotic Quality: Adherent Slough Tendon Exposed: No Muscle Exposed: No Joint Exposed: No Bone Exposed: No Treatment Notes Wound #1 (Ankle) Wound Laterality: Left Cleanser Soap and Water Discharge Instruction: May shower and wash wound with dial antibacterial soap and water prior to dressing change. Wound Cleanser Discharge Instruction: Cleanse the wound with wound cleanser prior to applying a clean dressing using gauze sponges, not tissue or cotton balls. Peri-Wound Care Ketoconazole Cream 2% Discharge Instruction: Apply Ketoconazole to lateral part of foot. Triamcinolone 15 (g) Discharge Instruction: T wound and leg o Zinc Oxide Ointment 30g tube Discharge Instruction: Apply Zinc Oxide to periwound with each dressing change Sween Lotion (Moisturizing lotion) Discharge Instruction: Apply moisturizing lotion as directed Topical Mupirocin Ointment Discharge Instruction: Apply Mupirocin (Bactroban) under the calcium alginate Ag as instructed Primary Dressing Hydrofera Blue Classic Foam, 4x4 in Discharge Instruction: Moisten with saline prior to applying to wound bed Secondary Dressing ABD Pad, 8x10 Discharge Instruction: Apply over primary dressing as directed. Zetuvit Plus 4x8 in Discharge Instruction: Apply over primary dressing as  directed. CarboFLEX Odor Control Dressing, 4x4 in Discharge Instruction: Apply over primary dressing as directed. Secured With Compression Wrap ThreePress (3 layer compression wrap) Discharge Instruction: Apply three layer compression as directed. Compression  Stockings Facilities manager) Signed: 07/23/2021 5:08:09 PM By: [name] Stall RN, BSN Entered By: [name] on 07/23/2021 12:38:12 -------------------------------------------------------------------------------- Vitals Details Patient Name: Date of Service: [name] 07/23/2021 12:30 PM Medical Record Number: [id number] Patient Account Number: [id number] Date of Birth/Sex: Treating RN: Dec 18, 1975 (45 y.o. [name] Primary Care Cylan Borum: Shelby [name] Other Clinician: Referring Roper Tolson: Treating Mariadelosang Wynns/Extender: Darleen Crocker in Treatment: 24 Vital Signs Time Taken: 12:30 Temperature (F): 98.1 Pulse (bpm): 62 Respiratory Rate (breaths/min): 20 Blood Pressure (mmHg): 121/74 Reference Range: 80 - 120 mg / dl Electronic Signature(s) Signed: 07/23/2021 5:08:09 PM By: [name] Stall RN, BSN Entered By: [name] on 07/23/2021 12:34:51

## 2021-07-30 ENCOUNTER — Other Ambulatory Visit: Payer: Self-pay

## 2021-07-30 ENCOUNTER — Encounter (HOSPITAL_BASED_OUTPATIENT_CLINIC_OR_DEPARTMENT_OTHER): Payer: Medicaid Other | Admitting: Internal Medicine

## 2021-07-30 DIAGNOSIS — L97328 Non-pressure chronic ulcer of left ankle with other specified severity: Secondary | ICD-10-CM | POA: Diagnosis not present

## 2021-07-30 DIAGNOSIS — I87332 Chronic venous hypertension (idiopathic) with ulcer and inflammation of left lower extremity: Secondary | ICD-10-CM | POA: Diagnosis not present

## 2021-07-30 NOTE — Progress Notes (Signed)
ALEXANDRU, MOORER ([id number]) Visit Report for 07/30/2021 SuperBill Details Patient Name: Date of Service: [name] 07/30/2021 Medical Record Number: [id number] Patient Account Number: [id number] Date of Birth/Sex: Treating RN: November 25, 1975 (45 y.o. [name] Primary Care Provider: [name] Other Clinician: Referring Provider: Treating Provider/Extender: Darleen Crocker in Treatment: 25 Diagnosis Coding ICD-10 Codes Code Description [phone] Non-pressure chronic ulcer of left ankle with other specified severity I87.332 Chronic venous hypertension (idiopathic) with ulcer and inflammation of left lower extremity Facility Procedures CPT4 Code Description Modifier Quantity [phone] (Facility Use Only) [phone] - APPLY MULTLAY COMPRS LWR LT LEG 1 Electronic Signature(s) Signed: 07/30/2021 5:16:25 PM By: Baltazar Najjar MD Signed: 07/30/2021 5:52:00 PM By: Shawn Stall RN, BSN Entered By: Shawn Stall on 07/30/2021 10:45:39

## 2021-07-30 NOTE — Progress Notes (Signed)
FIN, HUPP ([id number]) Visit Report for 07/30/2021 Arrival Information Details Patient Name: Date of Service: [name] [place] NIE 07/30/2021 10:30 A M Medical Record Number: [id number] Patient Account Number: [id number] Date of Birth/Sex: Treating RN: 01/06/1976 (45 y.o. Harlon Flor, Millard.Loa Primary Care Teigen Bellin: [name] Other Clinician: Referring Nashia Remus: Treating Lucerito Rosinski/Extender: Darleen Crocker in Treatment: 25 Visit Information History Since Last Visit Added or deleted any medications: No Patient Arrived: Ambulatory Any new allergies or adverse reactions: No Arrival Time: 10:43 Had a fall or experienced change in No Accompanied By: self activities of daily living that may affect Transfer Assistance: None risk of falls: Patient Identification Verified: Yes Signs or symptoms of abuse/neglect since last visito No Secondary Verification Process Completed: Yes Hospitalized since last visit: No Patient Requires Transmission-Based Precautions: No Implantable device outside of the clinic excluding No Patient Has Alerts: No cellular tissue based products placed in the center since last visit: Has Dressing in Place as Prescribed: Yes Has Compression in Place as Prescribed: Yes Pain Present Now: No Electronic Signature(s) Signed: 07/30/2021 5:52:00 PM By: Shawn Stall RN, BSN Entered By: Shawn Stall on 07/30/2021 10:44:00 -------------------------------------------------------------------------------- Compression Therapy Details Patient Name: Date of Service: [name] 07/30/2021 10:30 A M Medical Record Number: [id number] Patient Account Number: [id number] Date of Birth/Sex: Treating RN: [birth year] (45 y.o. [name] Primary Care Teeghan Hammer: [name] Other Clinician: Referring Anjelika Ausburn: Treating Jawaan Adachi/Extender: Darleen Crocker in Treatment: 25 Compression Therapy Performed for Wound  Assessment: Wound #1 Left Ankle Performed By: Clinician Shawn Stall, RN Compression Type: Three Emergency planning/management officer) Signed: 07/30/2021 5:52:00 PM By: Shawn Stall RN, BSN Entered By: Shawn Stall on 07/30/2021 10:44:35 -------------------------------------------------------------------------------- Encounter Discharge Information Details Patient Name: Date of Service: [name] 07/30/2021 10:30 A M Medical Record Number: [id number] Patient Account Number: [id number] Date of Birth/Sex: Treating RN: 1976-01-03 (45 y.o. [name] Primary Care Amylah Will: [name] Other Clinician: Referring Daneya Hartgrove: Treating Vidit Boissonneault/Extender: Darleen Crocker in Treatment: 25 Encounter Discharge Information Items Discharge Condition: Stable Ambulatory Status: Ambulatory Discharge Destination: Home Transportation: Private Auto Accompanied By: self Schedule Follow-up Appointment: Yes Clinical Summary of Care: Electronic Signature(s) Signed: 07/30/2021 5:52:00 PM By: Shawn Stall RN, BSN Entered By: Shawn Stall on 07/30/2021 10:45:29 -------------------------------------------------------------------------------- Patient/Caregiver Education Details Patient Name: Date of Service: [name] 12/29/2022andnbsp10:30 A M Medical Record Number: [id number] Patient Account Number: [id number] Date of Birth/Gender: Treating RN: 1976/03/20 (45 y.o. [name] Primary Care Physician: [name] Other Clinician: Referring Physician: Treating Physician/Extender: Darleen Crocker in Treatment: 25 Education Assessment Education Provided To: Patient Education Topics Provided Wound/Skin Impairment: Handouts: Skin Care Do's and Dont's Methods: Explain/Verbal Responses: Reinforcements needed Electronic Signature(s) Signed: 07/30/2021 5:52:00 PM By: Shawn Stall RN, BSN Entered By: Shawn Stall on  07/30/2021 10:45:04 -------------------------------------------------------------------------------- Wound Assessment Details Patient Name: Date of Service: [name] 07/30/2021 10:30 A M Medical Record Number: [id number] Patient Account Number: [id number] Date of Birth/Sex: Treating RN: 18-Jul-1976 (45 y.o. [name] Primary Care Brynlei Klausner: [name] Other Clinician: Referring Regnia Mathwig: Treating Jessalynn Mccowan/Extender: Darleen Crocker in Treatment: 25 Wound Status Wound Number: 1 Primary Etiology: Trauma, Other Wound Location: Left Ankle Wound Status: Open Wounding Event: Trauma Date Acquired: 10/14/2020 Weeks Of Treatment: 25 Clustered Wound: No Wound Measurements Length: (cm) 3 Width: (cm) 7 Depth: (cm) 0.1 Area: (cm) 16.493 Volume: (cm) 1.649 % Reduction in Area: 73.2% % Reduction  in Volume: 93.3% Wound Description Classification: Full Thickness Without Exposed Support Structu Exudate Amount: Medium Exudate Type: Serosanguineous Exudate Color: red, brown res Treatment Notes Wound #1 (Ankle) Wound Laterality: Left Cleanser Soap and Water Discharge Instruction: May shower and wash wound with dial antibacterial soap and water prior to dressing change. Wound Cleanser Discharge Instruction: Cleanse the wound with wound cleanser prior to applying a clean dressing using gauze sponges, not tissue or cotton balls. Peri-Wound Care Ketoconazole Cream 2% Discharge Instruction: Apply Ketoconazole to lateral part of foot. Triamcinolone 15 (g) Discharge Instruction: T wound and leg o Zinc Oxide Ointment 30g tube Discharge Instruction: Apply Zinc Oxide to periwound with each dressing change Sween Lotion (Moisturizing lotion) Discharge Instruction: Apply moisturizing lotion as directed Topical Mupirocin Ointment Discharge Instruction: Apply Mupirocin (Bactroban) under the calcium alginate Ag as instructed Primary Dressing Hydrofera  Blue Classic Foam, 4x4 in Discharge Instruction: Moisten with saline prior to applying to wound bed Secondary Dressing ABD Pad, 8x10 Discharge Instruction: Apply over primary dressing as directed. Zetuvit Plus 4x8 in Discharge Instruction: Apply over primary dressing as directed. CarboFLEX Odor Control Dressing, 4x4 in Discharge Instruction: Apply over primary dressing as directed. Secured With Compression Wrap ThreePress (3 layer compression wrap) Discharge Instruction: Apply three layer compression as directed. Compression Stockings Add-Ons Electronic Signature(s) Signed: 07/30/2021 5:52:00 PM By: Shawn Stall RN, BSN Entered By: Shawn Stall on 07/30/2021 10:44:22 -------------------------------------------------------------------------------- Vitals Details Patient Name: Date of Service: [name], [name] 07/30/2021 10:30 A M Medical Record Number: [id number] Patient Account Number: [id number] Date of Birth/Sex: Treating RN: [birth year] (45 y.o. [name] Primary Care Banks Chaikin: [name] Other Clinician: Referring Chimamanda Siegfried: Treating Ladean Steinmeyer/Extender: Darleen Crocker in Treatment: 25 Vital Signs Time Taken: 10:43 Temperature (F): 97.6 Pulse (bpm): 59 Respiratory Rate (breaths/min): 20 Blood Pressure (mmHg): 117/74 Reference Range: 80 - 120 mg / dl Electronic Signature(s) Signed: 07/30/2021 5:52:00 PM By: Shawn Stall RN, BSN Entered By: Shawn Stall on 07/30/2021 10:44:12

## 2021-08-02 DIAGNOSIS — Z419 Encounter for procedure for purposes other than remedying health state, unspecified: Secondary | ICD-10-CM | POA: Diagnosis not present

## 2021-08-06 ENCOUNTER — Encounter (HOSPITAL_BASED_OUTPATIENT_CLINIC_OR_DEPARTMENT_OTHER): Payer: Medicaid Other | Attending: Internal Medicine | Admitting: Internal Medicine

## 2021-08-06 ENCOUNTER — Other Ambulatory Visit: Payer: Self-pay

## 2021-08-06 DIAGNOSIS — L97322 Non-pressure chronic ulcer of left ankle with fat layer exposed: Secondary | ICD-10-CM | POA: Diagnosis not present

## 2021-08-06 DIAGNOSIS — I872 Venous insufficiency (chronic) (peripheral): Secondary | ICD-10-CM | POA: Insufficient documentation

## 2021-08-06 DIAGNOSIS — L97328 Non-pressure chronic ulcer of left ankle with other specified severity: Secondary | ICD-10-CM | POA: Diagnosis not present

## 2021-08-06 DIAGNOSIS — L97529 Non-pressure chronic ulcer of other part of left foot with unspecified severity: Secondary | ICD-10-CM | POA: Insufficient documentation

## 2021-08-06 NOTE — Progress Notes (Signed)
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 9/6; continued improvement in overall surface area down 1 cm in width we have been using Hydrofera Blue. We have interviewed him through a [place] interpreter today. He reports no additional issues 9/13 not much change in surface area today. We have been using Hydrofera Blue. He was interviewed through the [place] interpreter today. Still have him under compression. We used MolecuLight imaging 9/20; the wound is actually larger in its width. Also noted an odor and drainage. I used Iodoflex last time to help with the debris on the surface. He is not on any antibiotics. We did this interview through the [place] interpreter 9/27; better and with today. Odor and drainage seems better. We use silver alginate last time and that seems to have helped. We used his neighbor his [place] interpreter 10/4; improved length and improved condition of the wound bed. We have been using silver alginate. We interviewed him through his [place] interpreter. I am going to have vein and vascular look at this  including his reflux studies. He came into the clinic with a very angry inflamed wound that admitted there for many months. This now looks a lot better. He did not have anything in the calf on the left that had significant reflux although he did have it in his thigh. I want to make sure that everything can be done for this man to prevent this from reoccurring He has Medicaid and we might be able to order him a TheraSkin for an advanced treatment option. We will look into this. 10/14; patient comes in after a 10-day hiatus. Drainage weeping through his wrap. Marked malodor although the surface of the wound does not look so bad and dimensions are about the same. Through the interpreter on the phone he is not complaining of pain 10/20; wound surface covered in fibrinous debris. This is largely on the lateral part of his foot. We interviewed him through a interpreter on the phone A little more drainage  reported by our nurses. We have been using silver alginate under compression with sit to fit and CarboFlex He has been to see infectious disease Dr. Luciana Axe. Noted that he has been on Bactrim and clarithromycin for possible mycobacterial or other indolent infection. I am not sure if he is still taking antibiotics but these are listed as being discontinued and by infectious disease 10/27; our intake nurse reported large amount of drainage today more than usual. We have been using silver alginate. He still has not seen vein and vascular about the reflux studies I am not sure what the issue is here. He is very itchy under the wound on the left lateral foot The patient comes into clinic concerned that the 1 year of Medicaid that apparently was assigned to him when he entered the [place]. This is now coming to an end. I told him that I thought the best thing to do is the county social services i.e. [facility] social services I am not sure how else to help him with this. We of course will not discharge  him which I think was his concern. He does have an appointment with Dr. Myra Gianotti on 11/7 with regards to the reflux studies. 11/8; the patient saw Dr. Myra Gianotti who noted mild at the saphenofemoral junction on the right but he did not feel that the vein was pathologic and he did not feel he would benefit from laser ablation. Suggested continuing to focus on wound care. We are using silver alginate with Bactroban 11/17; wound looks about the same. Still a fair amount of drainage here. Although the wound is coming in surface area it still a deep wound full-thickness. I am using silver alginate with Bactroban He really applied for Medicaid. Wondering about a skin graft. I am uncertain about that right now because of the drainage 12/1; wound is measuring slightly smaller in width. Surface of this looks better. Changed him to [facility] still using topical Bactroban 12/8; no major change in dimensions although the surface looks excellent we have been using Bactroban and covering Hydrofera Blue. Considering application for TheraSkin if it is available through his version of Medicaid 12/15; nice healthy appearing wound advancing epithelialization 12/22; improvement in surface area using Bactroban under Hydrofera Blue. Originally a difficult large wound likely secondary to chronic venous insufficiency 08/06/2021; no major change in surface area. We are using Bactroban under Hydrofera Blue Objective Constitutional Sitting or standing Blood Pressure is within target range for patient.. Pulse regular and within target range for patient.Marland Kitchen Respirations regular, non-labored and within target range.. Temperature is normal and within the target range for the patient.Marland Kitchen Appears in no distress. Vitals Time Taken: 12:59 PM, Temperature: 97.3 F, Pulse: 85 bpm, Respiratory Rate: 20 breaths/min, Blood Pressure: 131/76 mmHg. General Notes: Wound exam; pulses feel normal. His edema control is adequate surface of the wound  under illumination especially on the medial part of the wound is nonviable. I used a #5 curette to remove this healthy looking generally after debridement. Hemostasis with direct pressure Integumentary (Hair, Skin) Wound #1 status is Open. Original cause of wound was Trauma. The date acquired was: 10/14/2020. The wound has been in treatment 26 weeks. The wound is located on the Left Ankle. The wound measures 2.6cm length x 7cm width x 0.1cm depth; 14.294cm^2 area and 1.429cm^3 volume. There is Fat Layer (Subcutaneous Tissue) exposed. There is no tunneling or undermining noted. There is a medium amount of serosanguineous drainage noted. The wound margin is distinct with  the outline attached to the wound base. There is large (67-100%) red, pink granulation within the wound bed. There is a small (1-33%) amount of necrotic tissue within the wound bed including Adherent Slough. 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 There was a Excisional Skin/Subcutaneous Tissue Debridement with a total area of 18.2 sq cm performed by Maxwell Caul., MD. With the following instrument(s): Curette to remove Viable and Non-Viable tissue/material. Material removed includes Subcutaneous Tissue, Slough, Skin: Dermis, Skin: Epidermis, and Fibrin/Exudate after achieving pain control using Other (benzocaine 20%). A time out was conducted at 13:02, prior to the start of the procedure. A Moderate amount of bleeding was controlled with Pressure. The procedure was tolerated well with a pain level of 0 throughout and a pain level of 0 following the procedure. Post Debridement Measurements: 2.6cm length x 7cm width x 0.1cm depth; 1.429cm^3 volume. Character of  Wound/Ulcer Post Debridement is improved. Post procedure Diagnosis Wound #1: Same as Pre-Procedure Pre-procedure diagnosis of Wound #1 is a Trauma, Other located on the Left Ankle . There was a Three Layer Compression Therapy Procedure by Shawn Stall, RN. Post procedure Diagnosis Wound #1: Same as Pre-Procedure Plan Follow-up Appointments: Return Appointment in 1 week. - with Dr. Leanord Hawking - Interpreter Required Thursday Cellular or Tissue Based Products: Cellular or Tissue Based Product Type: - wound center to run insurance approval for theraskin. Bathing/ Shower/ Hygiene: May shower with protection but do not get wound dressing(s) wet. Edema Control - Lymphedema / SCD / Other: Elevate legs to the level of the heart or above for 30 minutes daily and/or when sitting, a frequency of: - 3-4 times a day throughout the day. Avoid standing for long periods of time. Exercise regularly Off-Loading: Open toe surgical shoe to: - left foot WOUND #1: - Ankle Wound Laterality: Left Cleanser: Soap and Water 1 x Per Week/ Discharge Instructions: May shower and wash wound with dial antibacterial soap and water prior to dressing change. Cleanser: Wound Cleanser 1 x Per Week/ Discharge Instructions: Cleanse the wound with wound cleanser prior to applying a clean dressing using gauze sponges, not tissue or cotton balls. Peri-Wound Care: Ketoconazole Cream 2% 1 x Per Week/ Discharge Instructions: Apply Ketoconazole to lateral part of foot. Peri-Wound Care: Triamcinolone 15 (g) 1 x Per Week/ Discharge Instructions: T wound and leg o Peri-Wound Care: Zinc Oxide Ointment 30g tube 1 x Per Week/ Discharge Instructions: Apply Zinc Oxide to periwound with each dressing change Peri-Wound Care: Sween Lotion (Moisturizing lotion) 1 x Per Week/ Discharge Instructions: Apply moisturizing lotion as directed Prim Dressing: Maxorb Extra Calcium Alginate Dressing, 4x4 in 1 x Per Week/ ary Discharge Instructions: Apply  calcium alginate over sorbact. Prim Dressing: Cutimed Sorbact Swab 1 x Per Week/ ary Discharge Instructions: Apply to wound bed hydrogel under the sorbact. Secondary Dressing: ABD Pad, 8x10 1 x Per Week/ Discharge Instructions: Apply over primary dressing as directed. Secondary Dressing: Zetuvit Plus 4x8 in 1 x Per Week/ Discharge Instructions: Apply over primary dressing as directed. Secondary Dressing: CarboFLEX Odor Control Dressing, 4x4 in 1 x Per Week/ Discharge Instructions: Apply over primary dressing as directed. Com pression Wrap: ThreePress (3 layer compression wrap) 1 x Per Week/ Discharge Instructions: Apply three layer compression as directed. 1. No real improvement 2. Sorbact back  calcium alginate still under compression 3. We put TheraSkin through his insurance which is some version of Medicaid. I am not that optimistic Electronic Signature(s) Signed: 08/06/2021 4:38:12 PM By: Baltazar Najjarobson, Dashonda Bonneau MD Entered By: Baltazar Najjarobson, Elex Mainwaring on 08/06/2021 13:18:51 -------------------------------------------------------------------------------- SuperBill Details Patient Name: Date of Service: [name] [name] MarseillesYISHIMYE, [name] 08/06/2021 Medical Record Number: [id number] Patient Account Number: [id number] Date of Birth/Sex: Treating RN: 02/05/1976 (46 y.o. [name]) Deaton, Bobbi Primary Care Provider: Shelby Mattocksahbura, [name] Other Clinician: Referring Provider: Treating Provider/Extender: Darleen Crockerobson, Devian Bartolomei Dahbura, [name] Weeks in Treatment: 26 Diagnosis Coding ICD-10 Codes Code Description [phone] Non-pressure chronic ulcer of left ankle with other specified severity I87.332 Chronic venous hypertension (idiopathic) with ulcer and inflammation of left lower extremity Facility Procedures CPT4 Code: [id number] Description: 11042 - DEB SUBQ TISSUE 20 SQ CM/< ICD-10 Diagnosis Description L97.328 Non-pressure chronic ulcer of left ankle with other specified severity Modifier: Quantity: 1 Physician Procedures : CPT4 Code  Description Modifier [id number] 11042 - WC PHYS SUBQ TISS 20 SQ CM 1 ICD-10 Diagnosis Description L97.328 Non-pressure chronic ulcer of left ankle with other specified severity Quantity: Electronic Signature(s) Signed: 08/06/2021 4:38:12 PM By: Baltazar Najjarobson, Siena Poehler MD Entered By: Baltazar Najjarobson, Tashay Bozich on 08/06/2021 13:19:27

## 2021-08-06 NOTE — Progress Notes (Signed)
[name] ([id number]) Visit Report for 08/06/2021 Arrival Information Details Patient Name: Date of Service: [name], [name] [place] [name] 08/06/2021 12:30 PM Medical Record Number: [id number] Patient Account Number: [id number] Date of Birth/Sex: Treating RN: 05/09/1976 (46 y.o. [name]) Deaton, [name] Primary Care Shelia Magallon: [name] Other Clinician: Referring [name]: Treating [name] Weeks in Treatment: 26 Visit Information History Since Last Visit Added or deleted any medications: No Patient Arrived: Ambulatory Any new allergies or adverse reactions: No Arrival Time: 12:59 Had [name] fall or experienced change in No Accompanied By: interpreter activities of daily living that may affect Transfer Assistance: None risk of falls: Patient Identification Verified: Yes Signs or symptoms of abuse/neglect since last visito No Secondary Verification Process Completed: Yes Hospitalized since last visit: No Patient Requires Transmission-Based Precautions: No Implantable device outside of the clinic excluding No Patient Has Alerts: No cellular tissue based products placed in the center since last visit: Has Dressing in Place as Prescribed: Yes Has Compression in Place as Prescribed: Yes Pain Present Now: No Electronic Signature(s) Signed: 08/06/2021 5:33:22 PM By: [name] Stalleaton, [name] Entered By: [name] on 08/06/2021 13:00:06 -------------------------------------------------------------------------------- Compression Therapy Details Patient Name: Date of Service: [name], [name] 08/06/2021 12:30 PM Medical Record Number: [id number] Patient Account Number: [id number] Date of Birth/Sex: Treating RN: 05/20/1976 (46 y.o. [name]) Deaton, [name] Primary Care Lachele Lievanos: [name] Other Clinician: Referring Ersilia Brawley: Treating [name] Weeks in Treatment: 26 Compression Therapy Performed for Wound  Assessment: Wound #1 Left Ankle Performed By: Clinician [name] Stalleaton, Bobbi, RN Compression Type: Three Layer Post Procedure Diagnosis Same as Pre-procedure Electronic Signature(s) Signed: 08/06/2021 5:33:22 PM By: [name] Stalleaton, [name] Entered By: [name] on 08/06/2021 13:12:54 -------------------------------------------------------------------------------- Encounter Discharge Information Details Patient Name: Date of Service: [name], [name] 08/06/2021 12:30 PM Medical Record Number: [id number] Patient Account Number: [id number] Date of Birth/Sex: Treating RN: 12/03/1975 (46 y.o. [name]) Deaton, [name] Primary Care Eliud Polo: [name] Other Clinician: Referring Faithlynn Deeley: Treating Velena Keegan/Extender: Darleen Crockerobson, [name], [name] Weeks in Treatment: 26 Encounter Discharge Information Items Post Procedure Vitals Discharge Condition: Stable Temperature (F): 97.3 Ambulatory Status: Ambulatory Pulse (bpm): 85 Discharge Destination: Home Respiratory Rate (breaths/min): 16 Transportation: Private Auto Blood Pressure (mmHg): 131/76 Accompanied By: intrepreter Schedule Follow-up Appointment: Yes Clinical Summary of Care: Electronic Signature(s) Signed: 08/06/2021 5:33:22 PM By: [name] Stalleaton, [name] Entered By: [name] on 08/06/2021 13:13:32 -------------------------------------------------------------------------------- Lower Extremity Assessment Details Patient Name: Date of Service: [name], [name] 08/06/2021 12:30 PM Medical Record Number: [id number] Patient Account Number: [id number] Date of Birth/Sex: Treating RN: 03/16/1976 (46 y.o. [name]) Deaton, [name] Primary Care Zev Blue: [name] Other Clinician: Referring Miasia Crabtree: Treating [name], [name], [name] Weeks in Treatment: 26 Edema Assessment Assessed: [name] Searles[Left: Yes] [name][Right: No] Edema: [Left: N] [Right: o] Calf Left: Right: Point of Measurement: 28 cm From Medial Instep 33  cm Ankle Left: Right: Point of Measurement: 8 cm From Medial Instep 23 cm Vascular Assessment Pulses: Dorsalis Pedis Palpable: [Left:Yes] Electronic Signature(s) Signed: 08/06/2021 5:33:22 PM By: [name] Stalleaton, [name] Entered By: [name] on 08/06/2021 13:00:36 -------------------------------------------------------------------------------- Multi Wound Chart Details Patient Name: Date of Service: [name], [name] 08/06/2021 12:30 PM Medical Record Number: [id number] Patient Account Number: [id number] Date of Birth/Sex: Treating RN: 06/30/1976 (46 y.o. [name]) Deaton, [name] Primary Care Vivian Neuwirth: [name] Other Clinician: Referring [name]: Treating [name] Weeks in Treatment: 26 Vital Signs Height(in): Pulse(bpm): 85 Weight(lbs): Blood Pressure(mmHg): 131/76  Body Mass Index(BMI): Temperature(F): 97.3 Respiratory Rate(breaths/min): 20 Photos: [1:No Photos Left Ankle] [[name]:[name] [name]] Wound Location: [1:Trauma] [[name]:[name]] Wounding Event: [1:Trauma, Other] [[name]:[name]] Primary Etiology: [1:10/14/2020] [[name]:[name]] Date Acquired: [1:26] [[name]:[name]] Weeks of Treatment: [1:Open] [[name]:[name]] Wound Status: [1:2.6x7x0.1] [[name]:[name]] Measurements L x W x D (cm) [1:14.294] [[name]:[name]] [name] (cm) : rea [1:1.429] [[name]:[name]] Volume (cm) : [1:76.80%] [[name]:[name]] % Reduction in [name] rea: [1:94.20%] [[name]:[name]] % Reduction in Volume: [1:Full Thickness Without Exposed] [[name]:[name]] Classification: [1:Support Structures Medium] [[name]:[name]] Exudate [name] mount: [1:Serosanguineous] [[name]:[name]] Exudate Type: [1:red, brown] [[name]:[name]] Exudate Color: [1:Distinct, outline attached] [[name]:[name]] Wound Margin: [1:Large (67-100%)] [[name]:[name]] Granulation [name] mount: [1:Red, Pink] [[name]:[name]] Granulation Quality: [1:Small (1-33%)] [[name]:[name]] Necrotic [name] mount: [1:Fat Layer (Subcutaneous Tissue): Yes [name]] Exposed Structures: [1:Fascia: No Tendon: No Muscle: No Joint: No Bone: No Small  (1-33%)] [[name]:[name]] Epithelialization: [1:Debridement - Excisional] [[name]:[name]] Debridement: Pre-procedure Verification/Time Out 13:02 [[name]:[name]] Taken: [1:Other] [[name]:[name]] Pain Control: [1:Subcutaneous, Slough] [[name]:[name]] Tissue Debrided: [1:Skin/Subcutaneous Tissue] [[name]:[name]] Level: [1:18.2] [[name]:[name]] Debridement [name] (sq cm): [1:rea Curette] [[name]:[name]] Instrument: [1:Moderate] [[name]:[name]] Bleeding: [1:Pressure] [[name]:[name]] Hemostasis [name] chieved: [1:0] [[name]:[name]] Procedural Pain: [1:0] [[name]:[name]] Post Procedural Pain: [1:Procedure was tolerated well] [[name]:[name]] Debridement Treatment Response: [1:2.6x7x0.1] [[name]:[name]] Post Debridement Measurements L x W x D (cm) [1:1.429] [[name]:[name]] Post Debridement Volume: (cm) [1:Compression Therapy] [[name]:[name]] Procedures Performed: [1:Debridement] Treatment Notes Wound #1 (Ankle) Wound Laterality: Left Cleanser Soap and Water Discharge Instruction: May shower and wash wound with dial antibacterial soap and water prior to dressing change. Wound Cleanser Discharge Instruction: Cleanse the wound with wound cleanser prior to applying [name] clean dressing using gauze sponges, not tissue or cotton balls. Peri-Wound Care Ketoconazole Cream 2% Discharge Instruction: Apply Ketoconazole to lateral part of foot. Triamcinolone 15 (g) Discharge Instruction: T wound and leg o Zinc Oxide Ointment 30g tube Discharge Instruction: Apply Zinc Oxide to periwound with each dressing change Sween Lotion (Moisturizing lotion) Discharge Instruction: Apply moisturizing lotion as directed Topical Primary Dressing Maxorb Extra Calcium Alginate Dressing, 4x4 in Discharge Instruction: Apply calcium alginate over sorbact. Cutimed Sorbact Swab Discharge Instruction: Apply to wound bed hydrogel under the sorbact. Secondary Dressing ABD Pad, 8x10 Discharge Instruction: Apply over primary dressing as directed. Zetuvit Plus 4x8 in Discharge Instruction: Apply over primary dressing as  directed. CarboFLEX Odor Control Dressing, 4x4 in Discharge Instruction: Apply over primary dressing as directed. Secured With Compression Wrap ThreePress (3 layer compression wrap) Discharge Instruction: Apply three layer compression as directed. Compression Stockings Add-Ons Electronic Signature(s) Signed: 08/06/2021 4:38:12 PM By: Baltazar Najjar MD Signed: 08/06/2021 5:33:22 PM By: [name] Stall RN, [name] Entered By: Baltazar Najjar on 08/06/2021 13:14:53 -------------------------------------------------------------------------------- Multi-Disciplinary Care Plan Details Patient Name: Date of Service: [name] [name] [place] NA [name] 08/06/2021 12:30 PM Medical Record Number: [id number] Patient Account Number: [id number] Date of Birth/Sex: Treating RN: [birth year] (46 y.o. [name] Sours Primary Care Alica Shellhammer: [name] Mattocks Other Clinician: Referring Demitria Hay: Treating Ki Luckman/Extender: Darleen Crocker in Treatment: 26 Multidisciplinary Care Plan reviewed with physician Active Inactive Wound/Skin Impairment Nursing Diagnoses: Knowledge deficit related to ulceration/compromised skin integrity Goals: Patient/caregiver will verbalize understanding of skin care regimen Date Initiated: 02/05/2021 Target Resolution Date: 08/28/2021 Goal Status: Active Interventions: Assess patient/caregiver ability to obtain necessary supplies Assess patient/caregiver ability to perform ulcer/skin care regimen upon admission and as needed Provide education on ulcer and skin care Treatment Activities: Skin care regimen initiated : 02/05/2021 Topical wound management initiated : 02/05/2021 Notes: 03/31/21: Wound care regimen ongoing, target date extended. 04/21/21: Wound care ongoing, through interpreter patient states he is doing fine with  his dressing changes. Electronic Signature(s) Signed: 08/06/2021 5:33:22 PM By: [name] Stall RN, [name] Entered By: [name] Stall on 08/06/2021  13:02:31 -------------------------------------------------------------------------------- Pain Assessment Details Patient Name: Date of Service: [name] [name] [place]se[place] NA [name] 08/06/2021 12:30 PM Medical Record Number: [id number] Patient Account Number: [id number] Date of Birth/Sex: Treating RN: [birth year] (46 y.o. [name] Sours Primary Care Mycah Formica: [name] Mattocks Other Clinician: Referring Nami Strawder: Treating Lacosta Hargan/Extender: Darleen Crocker in Treatment: 26 Active Problems Location of Pain Severity and Description of Pain Patient Has Paino No Site Locations Rate the pain. Current Pain Level: 0 Pain Management and Medication Current Pain Management: Medication: No Cold Application: No Rest: No Massage: No Activity: No T.E.N.S.: No Heat Application: No Leg drop or elevation: No Is the Current Pain Management Adequate: Adequate How does your wound impact your activities of daily livingo Sleep: No Bathing: No Appetite: No Relationship With Others: No Bladder Continence: No Emotions: No Bowel Continence: No Work: No Toileting: No Drive: No Dressing: No Hobbies: No Psychologist, prison and probation services) Signed: 08/06/2021 5:33:22 PM By: [name] Stall RN, [name] Entered By: [name] Stall on 08/06/2021 13:00:28 -------------------------------------------------------------------------------- Patient/Caregiver Education Details Patient Name: Date of Service: [name] [name] [place]se[place] NA [name] 1/5/2023andnbsp12:30 PM Medical Record Number: [id number] Patient Account Number: [id number] Date of Birth/Gender: Treating RN: 1975-10-27 (46 y.o. [name] Sours Primary Care Physician: [name] Mattocks Other Clinician: Referring Physician: Treating Physician/Extender: Darleen Crocker in Treatment: 26 Education Assessment Education Provided To: Patient Education Topics Provided Wound/Skin Impairment: Handouts: Skin Care Do's and Dont's Methods:  Explain/Verbal Responses: Reinforcements needed Electronic Signature(s) Signed: 08/06/2021 5:33:22 PM By: [name] Stall RN, [name] Entered By: [name] Stall on 08/06/2021 13:02:49 -------------------------------------------------------------------------------- Wound Assessment Details Patient Name: Date of Service: [name] [name] [place]se[place] NA [name] 08/06/2021 12:30 PM Medical Record Number: [id number] Patient Account Number: [id number] Date of Birth/Sex: Treating RN: Jun 05, 1976 (46 y.o. [name] Flor, Millard.Loa Primary Care Aravind Chrismer: [name] Mattocks Other Clinician: Referring Onda Kattner: Treating Arlette Schaad/Extender: Darleen Crocker in Treatment: 26 Wound Status Wound Number: 1 Primary Etiology: Trauma, Other Wound Location: Left Ankle Wound Status: Open Wounding Event: Trauma Date Acquired: 10/14/2020 Weeks Of Treatment: 26 Clustered Wound: No Wound Measurements Length: (cm) 2.6 Width: (cm) 7 Depth: (cm) 0.1 Area: (cm) 14.294 Volume: (cm) 1.429 % Reduction in Area: 76.8% % Reduction in Volume: 94.2% Epithelialization: Small (1-33%) Tunneling: No Undermining: No Wound Description Classification: Full Thickness Without Exposed Support Structures Wound Margin: Distinct, outline attached Exudate Amount: Medium Exudate Type: Serosanguineous Exudate Color: red, brown Foul Odor After Cleansing: No Slough/Fibrino Yes Wound Bed Granulation Amount: Large (67-100%) Exposed Structure Granulation Quality: Red, Pink Fascia Exposed: No Necrotic Amount: Small (1-33%) Fat Layer (Subcutaneous Tissue) Exposed: Yes Necrotic Quality: Adherent Slough Tendon Exposed: No Muscle Exposed: No Joint Exposed: No Bone Exposed: No Treatment Notes Wound #1 (Ankle) Wound Laterality: Left Cleanser Soap and Water Discharge Instruction: May shower and wash wound with dial antibacterial soap and water prior to dressing change. Wound Cleanser Discharge Instruction: Cleanse the wound with wound  cleanser prior to applying [name] clean dressing using gauze sponges, not tissue or cotton balls. Peri-Wound Care Ketoconazole Cream 2% Discharge Instruction: Apply Ketoconazole to lateral part of foot. Triamcinolone 15 (g) Discharge Instruction: T wound and leg o Zinc Oxide Ointment 30g tube Discharge Instruction: Apply Zinc Oxide to periwound with each dressing change Sween Lotion (Moisturizing lotion) Discharge Instruction: Apply moisturizing lotion as directed Topical Primary Dressing Maxorb Extra Calcium Alginate Dressing, 4x4 in Discharge Instruction: Apply calcium alginate over sorbact. Cutimed Sorbact  Swab Discharge Instruction: Apply to wound bed hydrogel under the sorbact. Secondary Dressing ABD Pad, 8x10 Discharge Instruction: Apply over primary dressing as directed. Zetuvit Plus 4x8 in Discharge Instruction: Apply over primary dressing as directed. CarboFLEX Odor Control Dressing, 4x4 in Discharge Instruction: Apply over primary dressing as directed. Secured With Compression Wrap ThreePress (3 layer compression wrap) Discharge Instruction: Apply three layer compression as directed. Compression Stockings Add-Ons Electronic Signature(s) Signed: 08/06/2021 5:33:22 PM By: [name] Stalleaton, [name] Entered By: [name] on 08/06/2021 13:02:04 -------------------------------------------------------------------------------- Vitals Details Patient Name: Date of Service: [name], [name] 08/06/2021 12:30 PM Medical Record Number: [id number] Patient Account Number: [id number] Date of Birth/Sex: Treating RN: 04/02/1976 (46 y.o. [name]) Deaton, [name] Primary Care Jaimee Corum: [name] Other Clinician: Referring Aashrith Eves: Treating Sandralee Tarkington/Extender: Darleen Crockerobson, [name], [name] Weeks in Treatment: 26 Vital Signs Time Taken: 12:59 Temperature (F): 97.3 Pulse (bpm): 85 Respiratory Rate (breaths/min): 20 Blood Pressure (mmHg): 131/76 Reference Range: 80 - 120 mg /  dl Electronic Signature(s) Signed: 08/06/2021 5:33:22 PM By: [name] Stalleaton, [name] Entered By: [name] on 08/06/2021 13:00:19

## 2021-08-13 ENCOUNTER — Encounter (HOSPITAL_BASED_OUTPATIENT_CLINIC_OR_DEPARTMENT_OTHER): Payer: Medicaid Other | Admitting: Internal Medicine

## 2021-08-19 ENCOUNTER — Encounter (HOSPITAL_BASED_OUTPATIENT_CLINIC_OR_DEPARTMENT_OTHER): Payer: Medicaid Other | Admitting: Internal Medicine

## 2021-08-19 ENCOUNTER — Other Ambulatory Visit: Payer: Self-pay

## 2021-08-19 DIAGNOSIS — I872 Venous insufficiency (chronic) (peripheral): Secondary | ICD-10-CM | POA: Diagnosis not present

## 2021-08-19 DIAGNOSIS — L97322 Non-pressure chronic ulcer of left ankle with fat layer exposed: Secondary | ICD-10-CM | POA: Diagnosis not present

## 2021-08-19 DIAGNOSIS — L97529 Non-pressure chronic ulcer of other part of left foot with unspecified severity: Secondary | ICD-10-CM | POA: Diagnosis not present

## 2021-08-19 DIAGNOSIS — L97328 Non-pressure chronic ulcer of left ankle with other specified severity: Secondary | ICD-10-CM | POA: Diagnosis not present

## 2021-08-19 NOTE — Progress Notes (Signed)
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 a bit better than the last time I saw him although the progress is slow. We have been using silver alginate. 8/30; generally continuing improvement in surface area and condition of the wound surface we have been using Hydrofera Blue under compression. The patient's only complaint through the [place] interpreter is that he has some degree of itching 9/6; continued improvement in overall surface area down 1 cm in width we have been using Hydrofera Blue. We have interviewed him through a [place] interpreter today. He reports no additional issues 9/13 not much change in surface area today. We have been using Hydrofera Blue. He was interviewed through the [place] interpreter today. Still have him under compression. We used MolecuLight imaging 9/20; the wound is actually larger in its width. Also noted an odor and drainage. I used Iodoflex last time to help with the debris on the surface. He is not on any antibiotics. We did this interview through the [place] interpreter 9/27; better and with today. Odor and drainage seems better. We use silver alginate last time and that seems to have helped. We used his neighbor his [place] interpreter 10/4; improved length and improved condition of the wound bed. We have been using silver alginate. We interviewed him through his  [place] interpreter. I am going to have vein and vascular look at this including his reflux studies. He came into the clinic with a very angry inflamed wound that admitted there for many months. This now looks a lot better. He did not have anything in the calf on the left that had significant reflux although he did have it in his thigh. I want to make sure that everything can be done for this man to prevent this from reoccurring He has Medicaid and we might be able to order him a TheraSkin for an advanced treatment option. We will look into this. 10/14; patient comes in after a 10-day hiatus. Drainage weeping through his wrap. Marked malodor although the surface of the wound does not look so bad and dimensions are about the same. Through the interpreter on the phone he is not complaining of pain 10/20; wound surface covered in fibrinous debris. This is largely on the lateral part of  his foot. We interviewed him through a interpreter on the phone A little more drainage reported by our nurses. We have been using silver alginate under compression with sit to fit and CarboFlex He has been to see infectious disease Dr. Luciana Axe. Noted that he has been on Bactrim and clarithromycin for possible mycobacterial or other indolent infection. I am not sure if he is still taking antibiotics but these are listed as being discontinued and by infectious disease 10/27; our intake nurse reported large amount of drainage today more than usual. We have been using silver alginate. He still has not seen vein and vascular about the reflux studies I am not sure what the issue is here. He is very itchy under the wound on the left lateral foot The patient comes into clinic concerned that the 1 year of Medicaid that apparently was assigned to him when he entered the [place]. This is now coming to an end. I told him that I thought the best thing to do is the county social services i.e. [facility] social services I am  not sure how else to help him with this. We of course will not discharge him which I think was his concern. He does have an appointment with Dr. Myra Gianotti on 11/7 with regards to the reflux studies. 11/8; the patient saw Dr. Myra Gianotti who noted mild at the saphenofemoral junction on the right but he did not feel that the vein was pathologic and he did not feel he would benefit from laser ablation. Suggested continuing to focus on wound care. We are using silver alginate with Bactroban 11/17; wound looks about the same. Still a fair amount of drainage here. Although the wound is coming in surface area it still a deep wound full-thickness. I am using silver alginate with Bactroban He really applied for Medicaid. Wondering about a skin graft. I am uncertain about that right now because of the drainage 12/1; wound is measuring slightly smaller in width. Surface of this looks better. Changed him to [employer] [facility] still using topical Bactroban 12/8; no major change in dimensions although the surface looks excellent we have been using Bactroban and covering Hydrofera Blue. Considering application for TheraSkin if it is available through his version of Medicaid 12/15; nice healthy appearing wound advancing epithelialization 12/22; improvement in surface area using Bactroban under Hydrofera Blue. Originally a difficult large wound likely secondary to chronic venous insufficiency 08/06/2021; no major change in surface area. We are using Bactroban under Hydrofera Blue 08/19/2021; we are using Sorbact with covering calcium alginate and attempt to get a better looking wound surface with less debris.Still under compression He is denied for TheraSkin by his version of Medicaid. This is in it self not that surprising Objective Constitutional Sitting or standing Blood Pressure is within target range for patient.. Pulse regular and within target range for patient.Marland Kitchen Respirations regular, non-labored and within target  range.. Temperature is normal and within the target range for the patient.Marland Kitchen Appears in no distress. Vitals Time Taken: 10:44 AM, Temperature: 98.1 F, Pulse: 59 bpm, Respiratory Rate: 20 breaths/min, Blood Pressure: 111/72 mmHg. General Notes: Wound exam; his pulses feel normal. His edema control is adequate. I used a number feet 5 curette to debride about half the surface area the rest of this looks healthy. No evidence of surrounding infection Integumentary (Hair, Skin) Wound #1 status is Open. Original cause of wound was Trauma. The date acquired was: 10/14/2020. The wound has been in treatment 27 weeks. The wound is located  on the Left Ankle. The wound measures 3.5cm length x 7.5cm width x 0.1cm depth; 20.617cm^2 area and 2.062cm^3 volume. There is Fat Layer (Subcutaneous Tissue) exposed. There is no tunneling or undermining noted. There is a medium amount of serosanguineous drainage noted. The wound margin is distinct with the outline attached to the wound base. There is large (67-100%) red, pink granulation within the wound bed. There is a small (1-33%) amount of necrotic tissue within the wound bed including Adherent Slough. 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 There was a Excisional Skin/Subcutaneous Tissue Debridement with a total area of 8 sq cm performed by Maxwell Caul., MD. With the following instrument(s): Curette to remove Non-Viable tissue/material. Material removed includes Subcutaneous Tissue and Slough and after achieving pain control using Other (Benzocaine). No specimens were taken. A time out was conducted at 11:24, prior to the start of the procedure. A Minimum amount of bleeding was controlled with Pressure.  The procedure was tolerated well. Post Debridement Measurements: 3.5cm length x 7.5cm width x 0.1cm depth; 2.062cm^3 volume. Character of Wound/Ulcer Post Debridement is stable. Post procedure Diagnosis Wound #1: Same as Pre-Procedure Pre-procedure diagnosis of Wound #1 is a Trauma, Other located on the Left Ankle . There was a Three Layer Compression Therapy Procedure by [name], RN. Post procedure Diagnosis Wound #1: Same as Pre-Procedure Plan Follow-up Appointments: Return Appointment in 1 week. - with Dr. Leanord Hawking - Interpreter Required Cellular or Tissue Based Products: Cellular or Tissue Based Product Type: - Theraskin=Not Covered Bathing/ Shower/ Hygiene: May shower with protection but do not get wound dressing(s) wet. Edema Control - Lymphedema / SCD / Other: Elevate legs to the level of the heart or above for 30 minutes daily and/or when sitting, a frequency of: - 3-4 times a day throughout the day. Avoid standing for long periods of time. Exercise regularly Off-Loading: Open toe surgical shoe to: - left foot WOUND #1: - Ankle Wound Laterality: Left Cleanser: Soap and Water 1 x Per Week/ Discharge Instructions: May shower and wash wound with dial antibacterial soap and water prior to dressing change. Cleanser: Wound Cleanser 1 x Per Week/ Discharge Instructions: Cleanse the wound with wound cleanser prior to applying a clean dressing using gauze sponges, not tissue or cotton balls. Peri-Wound Care: Ketoconazole Cream 2% 1 x Per Week/ Discharge Instructions: Apply Ketoconazole to lateral part of foot. Peri-Wound Care: Triamcinolone 15 (g) 1 x Per Week/ Discharge Instructions: T wound and leg o Peri-Wound Care: Zinc Oxide Ointment 30g tube 1 x Per Week/ Discharge Instructions: Apply Zinc Oxide to periwound with each dressing change Peri-Wound Care: Sween Lotion (Moisturizing lotion) 1 x Per Week/ Discharge Instructions: Apply moisturizing lotion as directed Prim  Dressing: Maxorb Extra Calcium Alginate Dressing, 4x4 in 1 x Per Week/ ary Discharge Instructions: Apply calcium alginate over sorbact. Prim Dressing: Cutimed Sorbact Swab 1 x Per Week/ ary Discharge Instructions: Apply to wound bed hydrogel under the sorbact. Secondary Dressing: ABD Pad, 8x10 1 x Per Week/ Discharge Instructions: Apply over primary dressing as directed. Secondary Dressing: Zetuvit Plus 4x8 in 1 x Per Week/ Discharge Instructions: Apply over primary dressing as directed. Secondary Dressing: CarboFLEX Odor Control Dressing, 4x4 in 1 x Per Week/ Discharge Instructions: Apply over primary dressing as directed. Com pression Wrap: ThreePress (  3 layer compression wrap) 1 x Per Week/ Discharge Instructions: Apply three layer compression as directed. 1. I am still using Sorbact hydrogel with backing calcium alginate 2. If I can get the surface of this better then I will retry Hydrofera Blue or perhaps collagen 3. He is denied for TheraSkin which I had thought some versions of Medicaid cover. I am not sure if there is an alternative advanced product option and nor do I really know how to find that out Electronic Signature(s) Signed: 08/19/2021 3:43:46 PM By: Baltazar Najjar MD Entered By: Baltazar Najjar on 08/19/2021 11:36:36 -------------------------------------------------------------------------------- SuperBill Details Patient Name: Date of Service: [name] 08/19/2021 Medical Record Number: [id number] Patient Account Number: [id number] Date of Birth/Sex: Treating RN: Aug 21, 1975 (46 y.o. [name], [name] Primary Care Provider: Shelby [name] Other Clinician: Referring Provider: Treating Provider/Extender: [name] in Treatment: 27 Diagnosis Coding ICD-10 Codes Code Description [phone] Non-pressure chronic ulcer of left ankle with other specified severity I87.332 Chronic venous hypertension (idiopathic) with ulcer and  inflammation of left lower extremity Facility Procedures CPT4 Code: [id number] Description: 11042 - DEB SUBQ TISSUE 20 SQ CM/< ICD-10 Diagnosis Description L97.328 Non-pressure chronic ulcer of left ankle with other specified severity I87.332 Chronic venous hypertension (idiopathic) with ulcer and inflammation of left lower Modifier: extremity Quantity: 1 Physician Procedures : CPT4 Code Description Modifier [id number] 11042 - WC PHYS SUBQ TISS 20 SQ CM ICD-10 Diagnosis Description L97.328 Non-pressure chronic ulcer of left ankle with other specified severity I87.332 Chronic venous hypertension (idiopathic) with ulcer and  inflammation of left lower extremity Quantity: 1 Electronic Signature(s) Signed: 08/19/2021 3:43:46 PM By: Baltazar Najjar MD Signed: 08/19/2021 4:38:27 PM By: [name] Entered By: [name] on 08/19/2021 11:56:02

## 2021-08-19 NOTE — Progress Notes (Signed)
[name], Messi ([id number]) Visit Report for 08/19/2021 Arrival Information Details Patient Name: Date of Service: [name] [name] MarseillesYISHIMYE, A [place] NIE 08/19/2021 10:30 A M Medical Record Number: [id number] Patient Account Number: [id number] Date of Birth/Sex: Treating RN: 11/28/1975 (46 y.o. Charlean MerlM) Breedlove, [name] Primary Care Dannah Ryles: Shelby Mattocksahbura, [name] Other Clinician: Referring Jane Birkel: Treating Naman Spychalski/Extender: Darleen Crockerobson, [name], [name] Weeks in Treatment: 27 Visit Information History Since Last Visit Added or deleted any medications: No Patient Arrived: Ambulatory Any new allergies or adverse reactions: No Arrival Time: 10:43 Had a fall or experienced change in No Accompanied By: translator activities of daily living that may affect Transfer Assistance: None risk of falls: Patient Identification Verified: Yes Signs or symptoms of abuse/neglect since last visito No Secondary Verification Process Completed: Yes Hospitalized since last visit: No Patient Requires Transmission-Based Precautions: No Implantable device outside of the clinic excluding No Patient Has Alerts: No cellular tissue based products placed in the center since last visit: Has Dressing in Place as Prescribed: Yes Pain Present Now: Yes Electronic Signature(s) Signed: 08/19/2021 4:19:33 PM By: Karl Itoawkins, Destiny Entered By: Karl Itoawkins, Destiny on 08/19/2021 10:44:54 -------------------------------------------------------------------------------- Compression Therapy Details Patient Name: Date of Service: [name] [name] MarseillesYISHIMYE, [name] 08/19/2021 10:30 A M Medical Record Number: [id number] Patient Account Number: [id number] Date of Birth/Sex: Treating RN: 10/13/1975 (46 y.o. Lytle MichaelsM) Barnhart, Jodi Primary Care Hulet Ehrmann: Shelby Mattocksahbura, [name] Other Clinician: Referring Zendayah Hardgrave: Treating Tehani Mersman/Extender: Darleen Crockerobson, [name], [name] Weeks in Treatment: 27 Compression Therapy Performed for Wound Assessment: Wound #1 Left Ankle Performed  By: Clinician Antonieta IbaBarnhart, Jodi, RN Compression Type: Three Layer Post Procedure Diagnosis Same as Pre-procedure Electronic Signature(s) Signed: 08/19/2021 4:38:27 PM By: Antonieta IbaBarnhart, Jodi Entered By: Antonieta IbaBarnhart, Jodi on 08/19/2021 11:31:28 -------------------------------------------------------------------------------- Encounter Discharge Information Details Patient Name: Date of Service: [name] [name] MarseillesYISHIMYE, [name] 08/19/2021 10:30 A M Medical Record Number: [id number] Patient Account Number: [id number] Date of Birth/Sex: Treating RN: 09/11/1975 (46 y.o. Lytle MichaelsM) Barnhart, Jodi Primary Care Menashe Kafer: Shelby Mattocksahbura, [name] Other Clinician: Referring Felicite Zeimet: Treating Katrina Daddona/Extender: Darleen Crockerobson, [name], [name] Weeks in Treatment: 27 Encounter Discharge Information Items Post Procedure Vitals Discharge Condition: Stable Temperature (F): 98.1 Ambulatory Status: Ambulatory Pulse (bpm): 59 Discharge Destination: Home Respiratory Rate (breaths/min): 20 Transportation: Other Blood Pressure (mmHg): 111/72 Schedule Follow-up Appointment: Yes Clinical Summary of Care: Provided on 08/19/2021 Form Type Recipient Paper Patient Patient Electronic Signature(s) Signed: 08/19/2021 4:38:27 PM By: Antonieta IbaBarnhart, Jodi Entered By: Antonieta IbaBarnhart, Jodi on 08/19/2021 11:57:22 -------------------------------------------------------------------------------- Lower Extremity Assessment Details Patient Name: Date of Service: [name] [name] MarseillesYISHIMYE, [name] 08/19/2021 10:30 A M Medical Record Number: [id number] Patient Account Number: [id number] Date of Birth/Sex: Treating RN: 01/30/1976 (46 y.o. Lytle MichaelsM) Barnhart, Jodi Primary Care Jaivian Battaglini: Shelby Mattocksahbura, [name] Other Clinician: Referring Aleathia Purdy: Treating Rozann Holts/Extender: Darleen Crockerobson, [name], [name] Weeks in Treatment: 27 Edema Assessment Assessed: [name] Edema: [Left: N] [Right: o] Calf Left: Right: Point of Measurement: 28 cm From Medial Instep 33 cm Ankle Left:  Right: Point of Measurement: 8 cm From Medial Instep 23 cm Vascular Assessment Pulses: Dorsalis Pedis Palpable: [Left:Yes] Electronic Signature(s) Signed: 08/19/2021 4:38:27 PM By: Antonieta IbaBarnhart, Jodi Entered By: Antonieta IbaBarnhart, Jodi on 08/19/2021 11:14:07 -------------------------------------------------------------------------------- Multi Wound Chart Details Patient Name: Date of Service: [name] [name] MarseillesYISHIMYE, [name] 08/19/2021 10:30 A M Medical Record Number: [id number] Patient Account Number: [id number] Date of Birth/Sex: Treating RN: 12/02/1975 (46 y.o. Lucious GrovesM) Breedlove, [name] Primary Care Dontai Pember: Shelby Mattocksahbura, [name] Other Clinician: Referring Solaris Kram: Treating Lucas Exline/Extender: Darleen Crockerobson, [name], [name] Weeks in Treatment: 27 Vital Signs Height(in): Pulse(bpm): 59 Weight(lbs): Blood Pressure(mmHg): 111/72 Body Mass Index(BMI): Temperature(F): 98.1  Respiratory Rate(breaths/min): 20 Photos: [N/A:N/A] Left Ankle N/A N/A Wound Location: Trauma N/A N/A Wounding Event: Trauma, Other N/A N/A Primary Etiology: 10/14/2020 N/A N/A Date Acquired: 59 N/A N/A Weeks of Treatment: Open N/A N/A Wound Status: 3.5x7.5x0.1 N/A N/A Measurements L x W x D (cm) 20.617 N/A N/A A (cm) : rea 2.062 N/A N/A Volume (cm) : 66.50% N/A N/A % Reduction in A rea: 91.60% N/A N/A % Reduction in Volume: Full Thickness Without Exposed N/A N/A Classification: Support Structures Medium N/A N/A Exudate A mount: Serosanguineous N/A N/A Exudate Type: red, brown N/A N/A Exudate Color: Distinct, outline attached N/A N/A Wound Margin: Large (67-100%) N/A N/A Granulation A mount: Red, Pink N/A N/A Granulation Quality: Small (1-33%) N/A N/A Necrotic A mount: Fat Layer (Subcutaneous Tissue): Yes N/A N/A Exposed Structures: Fascia: No Tendon: No Muscle: No Joint: No Bone: No Medium (34-66%) N/A N/A Epithelialization: Debridement - Excisional N/A N/A Debridement: Pre-procedure  Verification/Time Out 11:24 N/A N/A Taken: Other N/A N/A Pain Control: Subcutaneous, Slough N/A N/A Tissue Debrided: Skin/Subcutaneous Tissue N/A N/A Level: 8 N/A N/A Debridement A (sq cm): rea Curette N/A N/A Instrument: Minimum N/A N/A Bleeding: Pressure N/A N/A Hemostasis A chieved: Procedure was tolerated well N/A N/A Debridement Treatment Response: 3.5x7.5x0.1 N/A N/A Post Debridement Measurements L x W x D (cm) 2.062 N/A N/A Post Debridement Volume: (cm) Compression Therapy N/A N/A Procedures Performed: Debridement Treatment Notes Electronic Signature(s) Signed: 08/19/2021 3:43:46 PM By: Baltazar Najjar MD Signed: 08/19/2021 4:57:53 PM By: Fonnie Mu RN Entered By: Baltazar Najjar on 08/19/2021 11:33:23 -------------------------------------------------------------------------------- Multi-Disciplinary Care Plan Details Patient Name: Date of Service: [name] 08/19/2021 10:30 A M Medical Record Number: [id number] Patient Account Number: [id number] Date of Birth/Sex: Treating RN: 01-25-1976 (46 y.o. [name] Primary Care Renea Schoonmaker: [name] Other Clinician: Referring Roosevelt Eimers: Treating Alanni Vader/Extender: Darleen Crocker in Treatment: 27 Multidisciplinary Care Plan reviewed with physician Active Inactive Wound/Skin Impairment Nursing Diagnoses: Knowledge deficit related to ulceration/compromised skin integrity Goals: Patient/caregiver will verbalize understanding of skin care regimen Date Initiated: 02/05/2021 Target Resolution Date: 08/28/2021 Goal Status: Active Interventions: Assess patient/caregiver ability to obtain necessary supplies Assess patient/caregiver ability to perform ulcer/skin care regimen upon admission and as needed Provide education on ulcer and skin care Treatment Activities: Skin care regimen initiated : 02/05/2021 Topical wound management initiated : 02/05/2021 Notes: 03/31/21: Wound  care regimen ongoing, target date extended. 04/21/21: Wound care ongoing, through interpreter patient states he is doing fine with his dressing changes. Electronic Signature(s) Signed: 08/19/2021 4:38:27 PM By: [name] Entered By: [name] on 08/19/2021 11:16:48 -------------------------------------------------------------------------------- Pain Assessment Details Patient Name: Date of Service: [name] 08/19/2021 10:30 A M Medical Record Number: [id number] Patient Account Number: [id number] Date of Birth/Sex: Treating RN: [birth year] (46 y.o. [name], [name] Primary Care Linton Stolp: [name] Other Clinician: Referring Yacine Garriga: Treating Darrill Vreeland/Extender: Darleen Crocker in Treatment: 27 Active Problems Location of Pain Severity and Description of Pain Patient Has Paino No Site Locations Pain Management and Medication Current Pain Management: Electronic Signature(s) Signed: 08/19/2021 4:19:33 PM By: Karl Ito Signed: 08/19/2021 4:57:53 PM By: Fonnie Mu RN Entered By: Karl Ito on 08/19/2021 10:45:32 -------------------------------------------------------------------------------- Patient/Caregiver Education Details Patient Name: Date of Service: [name] 1/18/2023andnbsp10:30 A M Medical Record Number: [id number] Patient Account Number: [id number] Date of Birth/Gender: Treating RN: 1975/12/21 (46 y.o. [name] Primary Care Physician: [name] Other Clinician: Referring Physician: Treating Physician/Extender: Sandi Carne  Weeks in Treatment: 27 Education Assessment Education Provided To: Patient Education Topics Provided Wound/Skin Impairment: Methods: Explain/Verbal, Printed Responses: State content correctly [employer]) Signed: 08/19/2021 4:38:27 PM By: [name] Entered By: [name] on 08/19/2021  11:17:02 -------------------------------------------------------------------------------- Wound Assessment Details Patient Name: Date of Service: [name] 08/19/2021 10:30 A M Medical Record Number: [id number] Patient Account Number: [id number] Date of Birth/Sex: Treating RN: [birth year] (46 y.o. [name], [name] Primary Care Jaylynn Siefert: [name] Other Clinician: Referring Fusae Florio: Treating Jeno Calleros/Extender: Darleen Crocker in Treatment: 27 Wound Status Wound Number: 1 Primary Etiology: Trauma, Other Wound Location: Left Ankle Wound Status: Open Wounding Event: Trauma Date Acquired: 10/14/2020 Weeks Of Treatment: 27 Clustered Wound: No Photos Wound Measurements Length: (cm) 3.5 Width: (cm) 7.5 Depth: (cm) 0.1 Area: (cm) 20.617 Volume: (cm) 2.062 % Reduction in Area: 66.5% % Reduction in Volume: 91.6% Epithelialization: Medium (34-66%) Tunneling: No Undermining: No Wound Description Classification: Full Thickness Without Exposed Support Structures Wound Margin: Distinct, outline attached Exudate Amount: Medium Exudate Type: Serosanguineous Exudate Color: red, brown Foul Odor After Cleansing: No Slough/Fibrino Yes Wound Bed Granulation Amount: Large (67-100%) Exposed Structure Granulation Quality: Red, Pink Fascia Exposed: No Necrotic Amount: Small (1-33%) Fat Layer (Subcutaneous Tissue) Exposed: Yes Necrotic Quality: Adherent Slough Tendon Exposed: No Muscle Exposed: No Joint Exposed: No Bone Exposed: No Treatment Notes Wound #1 (Ankle) Wound Laterality: Left Cleanser Soap and Water Discharge Instruction: May shower and wash wound with dial antibacterial soap and water prior to dressing change. Wound Cleanser Discharge Instruction: Cleanse the wound with wound cleanser prior to applying a clean dressing using gauze sponges, not tissue or cotton balls. Peri-Wound Care Ketoconazole Cream 2% Discharge Instruction:  Apply Ketoconazole to lateral part of foot. Triamcinolone 15 (g) Discharge Instruction: T wound and leg o Zinc Oxide Ointment 30g tube Discharge Instruction: Apply Zinc Oxide to periwound with each dressing change Sween Lotion (Moisturizing lotion) Discharge Instruction: Apply moisturizing lotion as directed Topical Primary Dressing Maxorb Extra Calcium Alginate Dressing, 4x4 in Discharge Instruction: Apply calcium alginate over sorbact. Cutimed Sorbact Swab Discharge Instruction: Apply to wound bed hydrogel under the sorbact. Secondary Dressing ABD Pad, 8x10 Discharge Instruction: Apply over primary dressing as directed. Zetuvit Plus 4x8 in Discharge Instruction: Apply over primary dressing as directed. CarboFLEX Odor Control Dressing, 4x4 in Discharge Instruction: Apply over primary dressing as directed. Secured With Compression Wrap ThreePress (3 layer compression wrap) Discharge Instruction: Apply three layer compression as directed. Compression Stockings Add-Ons Electronic Signature(s) Signed: 08/19/2021 4:38:27 PM By: [name] Signed: 08/19/2021 4:57:53 PM By: Fonnie Mu RN Entered By: [name] on 08/19/2021 11:15:05 -------------------------------------------------------------------------------- Vitals Details Patient Name: Date of Service: [name], [name] 08/19/2021 10:30 A M Medical Record Number: [id number] Patient Account Number: [id number] Date of Birth/Sex: Treating RN: 09-30-1975 (46 y.o. [name] Primary Care Kenedie Dirocco: [name] Other Clinician: Referring Davena Julian: Treating Shanea Karney/Extender: Darleen Crocker in Treatment: 27 Vital Signs Time Taken: 10:44 Temperature (F): 98.1 Pulse (bpm): 59 Respiratory Rate (breaths/min): 20 Blood Pressure (mmHg): 111/72 Reference Range: 80 - 120 mg / dl Electronic Signature(s) Signed: 08/19/2021 4:19:33 PM By: Karl Ito Entered By: Karl Ito on 08/19/2021 10:45:22

## 2021-08-27 ENCOUNTER — Other Ambulatory Visit: Payer: Self-pay

## 2021-08-27 ENCOUNTER — Encounter (HOSPITAL_BASED_OUTPATIENT_CLINIC_OR_DEPARTMENT_OTHER): Payer: Medicaid Other | Admitting: Internal Medicine

## 2021-08-27 DIAGNOSIS — L97529 Non-pressure chronic ulcer of other part of left foot with unspecified severity: Secondary | ICD-10-CM | POA: Diagnosis not present

## 2021-08-27 DIAGNOSIS — L97322 Non-pressure chronic ulcer of left ankle with fat layer exposed: Secondary | ICD-10-CM | POA: Diagnosis not present

## 2021-08-27 DIAGNOSIS — L97328 Non-pressure chronic ulcer of left ankle with other specified severity: Secondary | ICD-10-CM | POA: Diagnosis not present

## 2021-08-27 DIAGNOSIS — I872 Venous insufficiency (chronic) (peripheral): Secondary | ICD-10-CM | POA: Diagnosis not present

## 2021-08-27 NOTE — Progress Notes (Signed)
KAIPO, ARDIS ([id number]) Visit Report for 08/27/2021 Arrival Information Details Patient Name: Date of Service: [name] NIE 08/27/2021 10:30 [name] M Medical Record Number: [id number] Patient Account Number: [id number] Date of Birth/Sex: Treating RN: [birth year] (46 y.o. [name], [name] Primary Care [name]: [name] Other Clinician: Referring [name]: Treating [name]/Extender: [name] in Treatment: 29 Visit Information History Since Last Visit Added or deleted any medications: No Patient Arrived: Ambulatory Any new allergies or adverse reactions: No Arrival Time: 10:39 Had [name] fall or experienced change in No Accompanied By: translator activities of daily living that may affect Transfer Assistance: None risk of falls: Patient Identification Verified: Yes Signs or symptoms of abuse/neglect since last visito No Secondary Verification Process Completed: Yes Hospitalized since last visit: No Patient Requires Transmission-Based Precautions: No Implantable device outside of the clinic excluding No Patient Has Alerts: No cellular tissue based products placed in the center since last visit: Has Dressing in Place as Prescribed: Yes Pain Present Now: No Electronic Signature(s) Signed: 08/27/2021 3:23:49 PM By: Karl Ito Entered By: Karl Ito on 08/27/2021 10:39:55 -------------------------------------------------------------------------------- Compression Therapy Details Patient Name: Date of Service: [name] 08/27/2021 10:30 [name] M Medical Record Number: [id number] Patient Account Number: [id number] Date of Birth/Sex: Treating RN: 1975-08-26 (46 y.o. [name] Primary Care Keyion Knack: [name] Other Clinician: Referring [name]: Treating Wrigley Plasencia/Extender: [name] in Treatment: 29 Compression Therapy Performed for Wound Assessment: Wound #1 Left Ankle Performed By:  Clinician Fonnie Mu, RN Compression Type: Three Layer Post Procedure Diagnosis Same as Pre-procedure Electronic Signature(s) Signed: 08/27/2021 5:37:29 PM By: Fonnie Mu RN Entered By: Fonnie Mu on 08/27/2021 11:23:54 -------------------------------------------------------------------------------- Encounter Discharge Information Details Patient Name: Date of Service: [name] 08/27/2021 10:30 [name] M Medical Record Number: [id number] Patient Account Number: [id number] Date of Birth/Sex: Treating RN: Aug 09, 1975 (46 y.o. [name] Primary Care [name]: [name] Other Clinician: Referring Rindy Kollman: Treating Archie Atilano/Extender: [name] in Treatment: 29 Encounter Discharge Information Items Post Procedure Vitals Discharge Condition: Stable Temperature (F): 98.5 Ambulatory Status: Ambulatory Pulse (bpm): 87 Discharge Destination: Home Respiratory Rate (breaths/min): 20 Transportation: Private Auto Blood Pressure (mmHg): 108/72 Accompanied By: interpreter Schedule Follow-up Appointment: Yes Clinical Summary of Care: Electronic Signature(s) Signed: 08/27/2021 6:02:21 PM By: Shawn Stall RN, BSN Entered By: Shawn Stall on 08/27/2021 11:37:59 -------------------------------------------------------------------------------- Lower Extremity Assessment Details Patient Name: Date of Service: [name] 08/27/2021 10:30 [name] M Medical Record Number: [id number] Patient Account Number: [id number] Date of Birth/Sex: Treating RN: 1975/09/16 (46 y.o. [name], [name] Primary Care Amiayah Giebel: [name] Other Clinician: Referring Saori Umholtz: Treating Yanelle Sousa/Extender: [name] in Treatment: 29 Edema Assessment Assessed: [name]: No] [name]: No] Edema: [Left: N] [Right: o] Calf Left: Right: Point of Measurement: 28 cm From Medial Instep 33 cm Ankle Left: Right: Point of  Measurement: 8 cm From Medial Instep 23 cm Vascular Assessment Pulses: Dorsalis Pedis Palpable: [Left:Yes] Posterior Tibial Palpable: [Left:Yes] Electronic Signature(s) Signed: 08/27/2021 5:37:29 PM By: Fonnie Mu RN Entered By: Fonnie Mu on 08/27/2021 11:05:34 -------------------------------------------------------------------------------- Multi Wound Chart Details Patient Name: Date of Service: [name] 08/27/2021 10:30 [name] M Medical Record Number: [id number] Patient Account Number: [id number] Date of Birth/Sex: Treating RN: November 06, 1975 (46 y.o. [name] Primary Care Jerolene Kupfer: [name] Other Clinician: Referring Treven Holtman: Treating Nioma Mccubbins/Extender: [name] in Treatment: 29 Vital Signs Height(in): Pulse(bpm): 87 Weight(lbs): Blood Pressure(mmHg): 108/72 Body Mass  Index(BMI): Temperature(F): 98.5 Respiratory Rate(breaths/min): 20 Photos: [[name]:[name]] Left Ankle [name] [name] Wound Location: Trauma [name] [name] Wounding Event: Trauma, Other [name] [name] Primary Etiology: 10/14/2020 [name] [name] Date Acquired: 30 [name] [name] Weeks of Treatment: Open [name] [name] Wound Status: No [name] [name] Wound Recurrence: 2.5x7.5x0.1 [name] [name] Measurements L x W x D (cm) 14.726 [name] [name] [name] (cm) : rea 1.473 [name] [name] Volume (cm) : 76.10% [name] [name] % Reduction in [name] rea: 94.00% [name] [name] % Reduction in Volume: Full Thickness Without Exposed [name] [name] Classification: Support Structures Medium [name] [name] Exudate [name] mount: Serosanguineous [name] [name] Exudate Type: red, brown [name] [name] Exudate Color: Distinct, outline attached [name] [name] Wound Margin: Large (67-100%) [name] [name] Granulation [name] mount: Red, Pink [name] [name] Granulation Quality: Small (1-33%) [name] [name] Necrotic [name] mount: Fat Layer (Subcutaneous Tissue): Yes [name] [name] Exposed Structures: Fascia: No Tendon: No Muscle: No Joint: No Bone: No Medium (34-66%) [name] [name] Epithelialization: Debridement -  Excisional [name] [name] Debridement: Pre-procedure Verification/Time Out 11:15 [name] [name] Taken: Lidocaine [name] [name] Pain Control: Subcutaneous, Slough [name] [name] Tissue Debrided: Skin/Subcutaneous Tissue [name] [name] Level: 10 [name] [name] Debridement [name] (sq cm): rea Curette [name] [name] Instrument: Minimum [name] [name] Bleeding: Pressure [name] [name] Hemostasis [name] chieved: 0 [name] [name] Procedural Pain: 0 [name] [name] Post Procedural Pain: Procedure was tolerated well [name] [name] Debridement Treatment Response: 2.5x7.5x0.1 [name] [name] Post Debridement Measurements L x W x D (cm) 1.473 [name] [name] Post Debridement Volume: (cm) Compression Therapy [name] [name] Procedures Performed: Debridement Treatment Notes Electronic Signature(s) Signed: 08/27/2021 6:02:21 PM By: Shawn Stall RN, BSN Signed: 08/27/2021 6:04:31 PM By: Baltazar Najjar MD Entered By: Baltazar Najjar on 08/27/2021 11:26:34 -------------------------------------------------------------------------------- Multi-Disciplinary Care Plan Details Patient Name: Date of Service: [name] 08/27/2021 10:30 [name] M Medical Record Number: [id number] Patient Account Number: [id number] Date of Birth/Sex: Treating RN: [birth year] (46 y.o. [name], [name] Primary Care Arrionna Serena: [name] Other Clinician: Referring Shanda Cadotte: Treating Amil Moseman/Extender: [name] in Treatment: 29 Multidisciplinary Care Plan reviewed with physician Active Inactive Wound/Skin Impairment Nursing Diagnoses: Knowledge deficit related to ulceration/compromised skin integrity Goals: Patient/caregiver will verbalize understanding of skin care regimen Date Initiated: 02/05/2021 Target Resolution Date: 10/03/2021 Goal Status: Active Interventions: Assess patient/caregiver ability to obtain necessary supplies Assess patient/caregiver ability to perform ulcer/skin care regimen upon admission and as needed Provide education on ulcer and skin care Treatment  Activities: Skin care regimen initiated : 02/05/2021 Topical wound management initiated : 02/05/2021 Notes: 03/31/21: Wound care regimen ongoing, target date extended. 04/21/21: Wound care ongoing, through interpreter patient states he is doing fine with his dressing changes. Electronic Signature(s) Signed: 08/27/2021 5:37:29 PM By: Fonnie Mu RN Entered By: Fonnie Mu on 08/27/2021 11:21:23 -------------------------------------------------------------------------------- Pain Assessment Details Patient Name: Date of Service: [name] 08/27/2021 10:30 [name] M Medical Record Number: [id number] Patient Account Number: [id number] Date of Birth/Sex: Treating RN: 18-Oct-1975 (46 y.o. [name] Primary Care Tarron Krolak: [name] Other Clinician: Referring Jayleah Garbers: Treating Rya Rausch/Extender: [name] in Treatment: 29 Active Problems Location of Pain Severity and Description of Pain Patient Has Paino No Site Locations Pain Management and Medication Current Pain Management: Electronic Signature(s) Signed: 08/27/2021 3:23:49 PM By: Karl Ito Signed: 08/27/2021 6:02:21 PM By: Shawn Stall RN, BSN Entered By: Karl Ito on 08/27/2021 10:42:13 -------------------------------------------------------------------------------- Patient/Caregiver Education Details Patient Name: Date of Service: [name] 1/26/2023andnbsp10:30 [name] M Medical Record Number: [id number] Patient Account Number: [id number] Date of Birth/Gender: Treating  RN: 01/04/1976 (46 y.o. [name] GrovesM) [name], [name] Primary Care Physician: Shelby Mattocksahbura, [name] Other Clinician: Referring Physician: Treating Physician/Extender: [name] Crockerobson, [name], [name] Weeks in Treatment: 29 Education Assessment Education Provided To: Patient Education Topics Provided Wound/Skin Impairment: Methods: Explain/Verbal Responses: Reinforcements needed Electronic  Signature(s) Signed: 08/27/2021 5:37:29 PM By: Fonnie MuBreedlove, [name] RN Entered By: Fonnie MuBreedlove, [name] on 08/27/2021 11:21:48 -------------------------------------------------------------------------------- Wound Assessment Details Patient Name: Date of Service: [name] [name] MarseillesYISHIMYE, [name] [name] 08/27/2021 10:30 [name] M Medical Record Number: [id number] Patient Account Number: [id number] Date of Birth/Sex: Treating RN: 05/20/1976 (46 y.o. [name] FlorM) Deaton, Millard.LoaBobbi Primary Care Shantel Helwig: Shelby Mattocksahbura, [name] Other Clinician: Referring Aiya Keach: Treating [name]/Extender: [name] Crockerobson, [name], [name] Weeks in Treatment: 29 Wound Status Wound Number: 1 Primary Etiology: Trauma, Other Wound Location: Left Ankle Wound Status: Open Wounding Event: Trauma Date Acquired: 10/14/2020 Weeks Of Treatment: 29 Clustered Wound: No Photos Wound Measurements Length: (cm) 2.5 Width: (cm) 7.5 Depth: (cm) 0.1 Area: (cm) 14.726 Volume: (cm) 1.473 % Reduction in Area: 76.1% % Reduction in Volume: 94% Epithelialization: Medium (34-66%) Wound Description Classification: Full Thickness Without Exposed Support Structures Wound Margin: Distinct, outline attached Exudate Amount: Medium Exudate Type: Serosanguineous Exudate Color: red, brown Foul Odor After Cleansing: No Slough/Fibrino Yes Wound Bed Granulation Amount: Large (67-100%) Exposed Structure Granulation Quality: Red, Pink Fascia Exposed: No Necrotic Amount: Small (1-33%) Fat Layer (Subcutaneous Tissue) Exposed: Yes Necrotic Quality: Adherent Slough Tendon Exposed: No Muscle Exposed: No Joint Exposed: No Bone Exposed: No Treatment Notes Wound #1 (Ankle) Wound Laterality: Left Cleanser Soap and Water Discharge Instruction: May shower and wash wound with dial antibacterial soap and water prior to dressing change. Wound Cleanser Discharge Instruction: Cleanse the wound with wound cleanser prior to applying [name] clean dressing using gauze sponges, not tissue or  cotton balls. Peri-Wound Care Triamcinolone 15 (g) Discharge Instruction: T wound and leg o Zinc Oxide Ointment 30g tube Discharge Instruction: Apply Zinc Oxide to periwound with each dressing change Sween Lotion (Moisturizing lotion) Discharge Instruction: Apply moisturizing lotion as directed Topical Primary Dressing Maxorb Extra Calcium Alginate Dressing, 4x4 in Discharge Instruction: Apply calcium alginate over sorbact. Cutimed Sorbact Swab Discharge Instruction: Apply to wound bed hydrogel under the sorbact. Secondary Dressing ABD Pad, 8x10 Discharge Instruction: Apply over primary dressing as directed. Zetuvit Plus 4x8 in Discharge Instruction: Apply over primary dressing as directed. CarboFLEX Odor Control Dressing, 4x4 in Discharge Instruction: Apply over primary dressing as directed. Secured With Compression Wrap ThreePress (3 layer compression wrap) Discharge Instruction: Apply three layer compression as directed. Compression Stockings Add-Ons Electronic Signature(s) Signed: 08/27/2021 3:23:49 PM By: Karl Itoawkins, Destiny Signed: 08/27/2021 6:02:21 PM By: Shawn Stalleaton, Bobbi RN, BSN Entered By: Karl Itoawkins, Destiny on 08/27/2021 10:48:27 -------------------------------------------------------------------------------- Vitals Details Patient Name: Date of Service: [name], [name] [name] 08/27/2021 10:30 [name] M Medical Record Number: [id number] Patient Account Number: [id number] Date of Birth/Sex: Treating RN: 06/20/1976 (46 y.o. [name] SoursM) Deaton, [name] Primary Care Tamorah Hada: Shelby Mattocksahbura, [name] Other Clinician: Referring Emelynn Rance: Treating Kjersten Ormiston/Extender: [name] Crockerobson, [name], [name] Weeks in Treatment: 29 Vital Signs Time Taken: 10:41 Temperature (F): 98.5 Pulse (bpm): 87 Respiratory Rate (breaths/min): 20 Blood Pressure (mmHg): 108/72 Reference Range: 80 - 120 mg / dl Electronic Signature(s) Signed: 08/27/2021 3:23:49 PM By: Karl Itoawkins, Destiny Entered By: Karl Itoawkins, Destiny on  08/27/2021 10:42:03

## 2021-08-27 NOTE — Progress Notes (Signed)
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 surface we have been using Hydrofera Blue under compression. The patient's only complaint through the [place] interpreter is that he has some degree of itching 9/6; continued improvement in overall surface area down 1 cm in width we have been using Hydrofera Blue. We have interviewed him through a [place] interpreter today. He reports no additional issues 9/13 not much change in surface area today. We have been using Hydrofera Blue. He was interviewed through the [place] interpreter today. Still have him under compression. We used MolecuLight imaging 9/20; the wound is actually larger in its width. Also noted an odor and drainage. I used Iodoflex last time to help with the debris on the surface. He is not on any antibiotics. We did this interview through the [place] interpreter 9/27; better and with today. Odor and drainage seems better. We use silver alginate last time and that seems to have helped. We used his neighbor his [place] interpreter 10/4; improved length and improved  condition of the wound bed. We have been using silver alginate. We interviewed him through his [place] interpreter. I am going to have vein and vascular look at this including his reflux studies. He came into the clinic with a very angry inflamed wound that admitted there for many months. This now looks a lot better. He did not have anything in the calf on the left that had significant reflux although he did have it in his thigh. I want to make sure that everything can be done for this man to prevent this from reoccurring He has Medicaid and we might be able to order him a TheraSkin for an advanced treatment option. We will look into this. 10/14; patient comes in after a 10-day hiatus. Drainage weeping through his wrap. Marked malodor although the surface of the wound does not look so bad and dimensions are about the same. Through the interpreter  on the phone he is not complaining of pain 10/20; wound surface covered in fibrinous debris. This is largely on the lateral part of his foot. We interviewed him through a interpreter on the phone A little more drainage reported by our nurses. We have been using silver alginate under compression with sit to fit and CarboFlex He has been to see infectious disease Dr. Luciana Axe. Noted that he has been on Bactrim and clarithromycin for possible mycobacterial or other indolent infection. I am not sure if he is still taking antibiotics but these are listed as being discontinued and by infectious disease 10/27; our intake nurse reported large amount of drainage today more than usual. We have been using silver alginate. He still has not seen vein and vascular about the reflux studies I am not sure what the issue is here. He is very itchy under the wound on the left lateral foot The patient comes into clinic concerned that the 1 year of Medicaid that apparently was assigned to him when he entered the [place]. This is now coming to an end. I told him that I thought  the best thing to do is the county social services i.e. [facility] social services I am not sure how else to help him with this. We of course will not discharge him which I think was his concern. He does have an appointment with Dr. Myra Gianotti on 11/7 with regards to the reflux studies. 11/8; the patient saw Dr. Myra Gianotti who noted mild at the saphenofemoral junction on the right but he did not feel that the vein was pathologic and he did not feel he would benefit from laser ablation. Suggested continuing to focus on wound care. We are using silver alginate with Bactroban 11/17; wound looks about the same. Still a fair amount of drainage here. Although the wound is coming in surface area it still a deep wound full-thickness. I am using silver alginate with Bactroban He really applied for Medicaid. Wondering about a skin graft. I am uncertain about that right now because of the drainage 12/1; wound is measuring slightly smaller in width. Surface of this looks better. Changed him to [facility] still using topical Bactroban 12/8; no major change in dimensions although the surface looks excellent we have been using Bactroban and covering Hydrofera Blue. Considering application for TheraSkin if it is available through his version of Medicaid 12/15; nice healthy appearing wound advancing epithelialization 12/22; improvement in surface area using Bactroban under Hydrofera Blue. Originally a difficult large wound likely secondary to chronic venous insufficiency 08/06/2021; no major change in surface area. We are using Bactroban under Hydrofera Blue 08/19/2021; we are using Sorbact with covering calcium alginate and attempt to get a better looking wound surface with less debris.Still under compression He is denied for TheraSkin by his version of Medicaid. This is in it self not that surprising 1/26; using Sorbact with covering silver alginate. Surfaces look better except for the lateral part of the left  ankle wound. With the efforts of our staff we have him approved for TheraSkin through [facility] [previously we did not run the correct Medicaid version] Objective Constitutional Sitting or standing Blood Pressure is within target range for patient.. Pulse regular and within target range for patient.Marland Kitchen Respirations regular, non-labored and within target range.. Temperature is normal and within the target range for the patient.Marland Kitchen Appears in no distress. Vitals Time Taken: 10:41 AM, Temperature: 98.5 F, Pulse: 87 bpm, Respiratory Rate: 20 breaths/min, Blood Pressure: 108/72 mmHg. General Notes:  Wound exam; his pulses feel normal. His edema control was good. I used a #5 curette to debride the lateral part of the wound area which is the same area I debrided last week. Very adherent subcutaneous debris Integumentary (Hair, Skin) Wound #1 status is Open. Original cause of wound was Trauma. The date acquired was: 10/14/2020. The wound has been in treatment 29 weeks. The wound is located on the Left Ankle. The wound measures 2.5cm length x 7.5cm width x 0.1cm depth; 14.726cm^2 area and 1.473cm^3 volume. There is Fat Layer (Subcutaneous Tissue) exposed. There is a medium amount of serosanguineous drainage noted. The wound margin is distinct with the outline attached to the wound base. There is large (67-100%) red, pink granulation within the wound bed. There is a small (1-33%) amount of necrotic tissue within the wound bed including Adherent Slough. 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 There was a Excisional Skin/Subcutaneous Tissue Debridement with a total area of 10 sq cm performed by Maxwell Caulobson, Tony Friscia G., MD. With the following instrument(s):  Curette to remove Viable and Non-Viable tissue/material. Material removed includes Subcutaneous Tissue, Slough, Skin: Dermis, and Skin: Epidermis after achieving pain control using Lidocaine. No specimens were taken. A time out was conducted at 11:15, prior to the start of the procedure. A Minimum amount of bleeding was controlled with Pressure. The procedure was tolerated well with a pain level of 0 throughout and a pain level of 0 following the procedure. Post Debridement Measurements: 2.5cm length x 7.5cm width x 0.1cm depth; 1.473cm^3 volume. Character of Wound/Ulcer Post Debridement is improved. Post procedure Diagnosis Wound #1: Same as Pre-Procedure Pre-procedure diagnosis of Wound #1 is a Trauma, Other located on the Left Ankle . There was a Three Layer Compression Therapy Procedure by Fonnie MuBreedlove, [name], RN. Post procedure Diagnosis Wound #1: Same as Pre-Procedure Plan Follow-up Appointments: Return Appointment in 1 week. - with Dr. Leanord Hawkingobson - Interpreter Required Nurse Visit: - 2 weeks on Thursday!! Cellular or Tissue Based Products: Cellular or Tissue Based Product Type: - Theraskin is COVERED. $4 CO-PAY PER APPLICATION Bathing/ Shower/ Hygiene: May shower with protection but do not get wound dressing(s) wet. Edema Control - Lymphedema / SCD / Other: Elevate legs to the level of the heart or above for 30 minutes daily and/or when sitting, a frequency of: - 3-4 times a day throughout the day. Avoid standing for long periods of time. Exercise regularly Off-Loading: Open toe surgical shoe to: - left foot WOUND #1: - Ankle Wound Laterality: Left Cleanser: Soap and Water 1 x Per Week/ Discharge Instructions: May shower and wash wound with dial antibacterial soap and water prior to dressing change. Cleanser: Wound Cleanser 1 x Per Week/ Discharge Instructions: Cleanse the wound with wound cleanser prior to applying a clean dressing using gauze sponges, not tissue or cotton  balls. Peri-Wound Care: Triamcinolone 15 (g) 1 x Per Week/ Discharge Instructions: T wound and leg o Peri-Wound Care: Zinc Oxide Ointment 30g tube 1 x Per Week/ Discharge Instructions: Apply Zinc Oxide to periwound with each dressing change Peri-Wound Care: Sween Lotion (Moisturizing lotion) 1 x Per Week/ Discharge Instructions: Apply moisturizing lotion as directed Prim Dressing: Maxorb Extra Calcium Alginate Dressing, 4x4 in 1 x Per Week/ ary Discharge Instructions: Apply calcium alginate over sorbact. Prim Dressing: Cutimed  Sorbact Swab 1 x Per Week/ ary Discharge Instructions: Apply to wound bed hydrogel under the sorbact. Secondary Dressing: ABD Pad, 8x10 1 x Per Week/ Discharge Instructions: Apply over primary dressing as directed. Secondary Dressing: Zetuvit Plus 4x8 in 1 x Per Week/ Discharge Instructions: Apply over primary dressing as directed. Secondary Dressing: CarboFLEX Odor Control Dressing, 4x4 in 1 x Per Week/ Discharge Instructions: Apply over primary dressing as directed. Com pression Wrap: ThreePress (3 layer compression wrap) 1 x Per Week/ Discharge Instructions: Apply three layer compression as directed. 1. We continued with the Sorbact which is really helped with most of the wound surface here. We are backing this with alginate because of drainage 2. We have ordered TheraSkin for next week and I have explained this to the patient. He will come back on the second week for a nurse visit before reapplication in 2 weeks. Electronic Signature(s) Signed: 08/27/2021 6:04:31 PM By: Baltazar Najjar MD Entered By: Baltazar Najjar on 08/27/2021 11:42:43 -------------------------------------------------------------------------------- SuperBill Details Patient Name: Date of Service: [name] 08/27/2021 Medical Record Number: [id number] Patient Account Number: [id number] Date of Birth/Sex: Treating RN: October 09, 1975 (46 y.o. [name], [name] Primary Care  Provider: [name] Other Clinician: Referring Provider: Treating Provider/Extender: Darleen Crocker in Treatment: 29 Diagnosis Coding ICD-10 Codes Code Description [phone] Non-pressure chronic ulcer of left ankle with other specified severity I87.332 Chronic venous hypertension (idiopathic) with ulcer and inflammation of left lower extremity Facility Procedures CPT4 Code: [id number] Description: 11042 - DEB SUBQ TISSUE 20 SQ CM/< ICD-10 Diagnosis Description L97.328 Non-pressure chronic ulcer of left ankle with other specified severity Modifier: Quantity: 1 Physician Procedures : CPT4 Code Description Modifier [id number] 11042 - WC PHYS SUBQ TISS 20 SQ CM ICD-10 Diagnosis Description L97.328 Non-pressure chronic ulcer of left ankle with other specified severity Quantity: 1 Electronic Signature(s) Signed: 08/27/2021 6:04:31 PM By: Baltazar Najjar MD Entered By: Baltazar Najjar on 08/27/2021 11:42:54

## 2021-09-02 DIAGNOSIS — Z419 Encounter for procedure for purposes other than remedying health state, unspecified: Secondary | ICD-10-CM | POA: Diagnosis not present

## 2021-09-03 ENCOUNTER — Other Ambulatory Visit: Payer: Self-pay

## 2021-09-03 ENCOUNTER — Encounter (HOSPITAL_BASED_OUTPATIENT_CLINIC_OR_DEPARTMENT_OTHER): Payer: Medicaid Other | Attending: Internal Medicine | Admitting: Internal Medicine

## 2021-09-03 DIAGNOSIS — L97322 Non-pressure chronic ulcer of left ankle with fat layer exposed: Secondary | ICD-10-CM | POA: Diagnosis not present

## 2021-09-03 DIAGNOSIS — L97328 Non-pressure chronic ulcer of left ankle with other specified severity: Secondary | ICD-10-CM | POA: Diagnosis not present

## 2021-09-03 DIAGNOSIS — I87332 Chronic venous hypertension (idiopathic) with ulcer and inflammation of left lower extremity: Secondary | ICD-10-CM | POA: Insufficient documentation

## 2021-09-03 DIAGNOSIS — F17200 Nicotine dependence, unspecified, uncomplicated: Secondary | ICD-10-CM | POA: Insufficient documentation

## 2021-09-07 NOTE — Progress Notes (Signed)
[name], [name] ([id number]) Visit Report for 09/03/2021 Arrival Information Details Patient Name: Date of Service: [name] [place] [name] 09/03/2021 1:00 PM Medical Record Number: [id number] Patient Account Number: [id number] Date of Birth/Sex: Treating RN: 1976/03/04 (46 y.o. [name], [name] [name] Care [name]: [name] Other Clinician: Referring Dashawna Delbridge: Treating [name]: [name] in Treatment: 30 Visit Information History Since Last Visit Added or deleted any medications: No Patient Arrived: Ambulatory Any new allergies or adverse reactions: No Arrival Time: 13:08 Had a fall or experienced change in No Accompanied By: interpreter activities of daily living that may affect Transfer Assistance: None risk of falls: Patient Identification Verified: Yes Signs or symptoms of abuse/neglect since last visito No Secondary Verification Process Completed: Yes Hospitalized since last visit: No Patient Requires Transmission-Based Precautions: No Implantable device outside of the clinic excluding No Patient Has Alerts: No cellular tissue based products placed in the center since last visit: Has Dressing in Place as Prescribed: Yes Has Compression in Place as Prescribed: Yes Pain Present Now: No Electronic Signature(s) Signed: 09/07/2021 2:05:18 PM By: [name] Abts RN, BSN Entered By: [name] on 09/03/2021 13:09:03 -------------------------------------------------------------------------------- Compression Therapy Details Patient Name: Date of Service: [name] NA [name] 09/03/2021 1:00 PM Medical Record Number: [id number] Patient Account Number: [id number] Date of Birth/Sex: Treating RN: [birth year] (46 y.o. [name] Care Krystan Northrop: [name] Other Clinician: Referring Rolondo Pierre: Treating Belva Koziel/Extender: [name] in Treatment: 30 Compression Therapy Performed for Wound  Assessment: Wound #1 Left Ankle Performed By: Clinician [name] Abts, RN Compression Type: Three Layer Post Procedure Diagnosis Same as Pre-procedure Electronic Signature(s) Signed: 09/07/2021 2:05:18 PM By: [name] Abts RN, BSN Entered By: [name] on 09/03/2021 13:33:16 -------------------------------------------------------------------------------- Encounter Discharge Information Details Patient Name: Date of Service: [name] NA [name] 09/03/2021 1:00 PM Medical Record Number: [id number] Patient Account Number: [id number] Date of Birth/Sex: Treating RN: 1976-05-27 (46 y.o. [name] Care Jilberto Vanderwall: [name] Other Clinician: Referring Virgle Arth: Treating Brena Windsor/Extender: [name] in Treatment: 30 Encounter Discharge Information Items Post Procedure Vitals Discharge Condition: Stable Temperature (F): 97.8 Ambulatory Status: Ambulatory Pulse (bpm): 64 Discharge Destination: Home Respiratory Rate (breaths/min): 18 Transportation: Private Auto Blood Pressure (mmHg): 138/78 Accompanied By: interpreter Schedule Follow-up Appointment: Yes Clinical Summary of Care: Patient Declined Electronic Signature(s) Signed: 09/07/2021 2:05:18 PM By: [name] Abts RN, BSN Entered By: [name] on 09/03/2021 16:34:22 -------------------------------------------------------------------------------- Lower Extremity Assessment Details Patient Name: Date of Service: [name] NA [name] 09/03/2021 1:00 PM Medical Record Number: [id number] Patient Account Number: [id number] Date of Birth/Sex: Treating RN: 1976-05-14 (46 y.o. [name] Care Marlys Stegmaier: [name] Other Clinician: Referring Shannette Tabares: Treating Nikodem Leadbetter/Extender: [name] in Treatment: 30 Edema Assessment Assessed: [name]: No] [name]: No] Edema: [Left: N] [Right: o] Calf Left: Right: Point of Measurement: 28 cm From  Medial Instep 33 cm Ankle Left: Right: Point of Measurement: 8 cm From Medial Instep 23 cm Vascular Assessment Pulses: Dorsalis Pedis Palpable: [Left:Yes] Electronic Signature(s) Signed: 09/07/2021 2:05:18 PM By: [name] Abts RN, BSN Entered By: [name] on 09/03/2021 13:17:34 -------------------------------------------------------------------------------- Multi Wound Chart Details Patient Name: Date of Service: [name] NA [name] 09/03/2021 1:00 PM Medical Record Number: [id number] Patient Account Number: [id number] Date of Birth/Sex: Treating RN: 09/16/1975 (46 y.o. [name] Care Rokia Bosket: [name] Other Clinician: Referring Thena Devora: Treating Julizza Sassone/Extender: [name] in Treatment: 30 Vital Signs Height(in): Pulse(bpm): 64 Weight(lbs): Blood  Pressure(mmHg): 138/78 Body Mass Index(BMI): Temperature(F): 97.8 Respiratory Rate(breaths/min): 18 Photos: [N/A:N/A] Left Ankle N/A N/A Wound Location: Trauma N/A N/A Wounding Event: Trauma, Other N/A N/A [name] Etiology: 10/14/2020 N/A N/A Date Acquired: 30 N/A N/A Weeks of Treatment: Open N/A N/A Wound Status: No N/A N/A Wound Recurrence: 3.4x7.3x0.2 N/A N/A Measurements L x W x D (cm) 19.494 N/A N/A A (cm) : rea 3.899 N/A N/A Volume (cm) : 68.30% N/A N/A % Reduction in A rea: 84.20% N/A N/A % Reduction in Volume: Full Thickness Without Exposed N/A N/A Classification: Support Structures Medium N/A N/A Exudate Amount: Purulent N/A N/A Exudate Type: yellow, brown, green N/A N/A Exudate Color: Yes N/A N/A Foul Odor A Cleansing: fter No N/A N/A Odor Anticipated Due to Product Use: Distinct, outline attached N/A N/A Wound Margin: Medium (34-66%) N/A N/A Granulation A mount: Red, Pink N/A N/A Granulation Quality: Medium (34-66%) N/A N/A Necrotic Amount: Fat Layer (Subcutaneous Tissue): Yes N/A N/A Exposed Structures: Fascia:  No Tendon: No Muscle: No Joint: No Bone: No Medium (34-66%) N/A N/A Epithelialization: Debridement - Excisional N/A N/A Debridement: Pre-procedure Verification/Time Out 13:27 N/A N/A Taken: Subcutaneous, Slough N/A N/A Tissue Debrided: Skin/Subcutaneous Tissue N/A N/A Level: 24.82 N/A N/A Debridement A (sq cm): rea Curette N/A N/A Instrument: Moderate N/A N/A Bleeding: Pressure N/A N/A Hemostasis A chieved: 5 N/A N/A Procedural Pain: 3 N/A N/A Post Procedural Pain: Procedure was tolerated well N/A N/A Debridement Treatment Response: 3.4x7.3x0.2 N/A N/A Post Debridement Measurements L x W x D (cm) 3.899 N/A N/A Post Debridement Volume: (cm) Compression Therapy N/A N/A Procedures Performed: Debridement Treatment Notes Electronic Signature(s) Signed: 09/03/2021 4:50:25 PM By: [name] Najjar MD Signed: 09/07/2021 2:05:18 PM By: [name] Abts RN, BSN Entered By: [name] on 09/03/2021 13:35:52 -------------------------------------------------------------------------------- Multi-Disciplinary Care Plan Details Patient Name: Date of Service: [name] NA [name] 09/03/2021 1:00 PM Medical Record Number: [id number] Patient Account Number: [id number] Date of Birth/Sex: Treating RN: 25-Aug-1975 (46 y.o. [name] Care Ileane Sando: [name] Other Clinician: Referring Beryl Hornberger: Treating Murle Hellstrom/Extender: [name] in Treatment: 30 Multidisciplinary Care Plan reviewed with physician Active Inactive Wound/Skin Impairment Nursing Diagnoses: Knowledge deficit related to ulceration/compromised skin integrity Goals: Patient/caregiver will verbalize understanding of skin care regimen Date Initiated: 02/05/2021 Target Resolution Date: 10/03/2021 Goal Status: Active Interventions: Assess patient/caregiver ability to obtain necessary supplies Assess patient/caregiver ability to perform ulcer/skin care regimen upon admission  and as needed Provide education on ulcer and skin care Treatment Activities: Skin care regimen initiated : 02/05/2021 Topical wound management initiated : 02/05/2021 Notes: 03/31/21: Wound care regimen ongoing, target date extended. 04/21/21: Wound care ongoing, through interpreter patient states he is doing fine with his dressing changes. Electronic Signature(s) Signed: 09/07/2021 2:05:18 PM By: [name] Abts RN, BSN Entered By: [name] on 09/03/2021 16:32:25 -------------------------------------------------------------------------------- Pain Assessment Details Patient Name: Date of Service: [name] NA [name] 09/03/2021 1:00 PM Medical Record Number: [id number] Patient Account Number: [id number] Date of Birth/Sex: Treating RN: 1976/05/17 (46 y.o. [name] Care Ayahna Solazzo: [name] Other Clinician: Referring Jennah Satchell: Treating Phuoc Huy/Extender: [name] in Treatment: 30 Active Problems Location of Pain Severity and Description of Pain Patient Has Paino No Site Locations Pain Management and Medication Current Pain Management: Electronic Signature(s) Signed: 09/07/2021 2:05:18 PM By: [name] Abts RN, BSN Entered By: [name] on 09/03/2021 13:17:10 -------------------------------------------------------------------------------- Patient/Caregiver Education Details Patient Name: Date of Service: [name] NA [name] 2/2/2023andnbsp1:00 PM Medical Record Number: [id number] Patient Account  Number: [id number] Date of Birth/Gender: Treating RN: 1975/08/23 (46 y.o. [name] Care Physician: [name] Other Clinician: Referring Physician: Treating Physician/Extender: [name] in Treatment: 30 Education Assessment Education Provided To: Patient Education Topics Provided Wound/Skin Impairment: Methods: Explain/Verbal Responses: State content correctly [employer]) Signed: 09/07/2021 2:05:18 PM By: [name] Abts RN, BSN Entered By: [name] on 09/03/2021 16:32:39 -------------------------------------------------------------------------------- Wound Assessment Details Patient Name: Date of Service: [name] NA [name] 09/03/2021 1:00 PM Medical Record Number: [id number] Patient Account Number: [id number] Date of Birth/Sex: Treating RN: 1975/10/11 (46 y.o. [name] Care Eneida Evers: [name] Other Clinician: Referring Briunna Leicht: Treating Koren Sermersheim/Extender: [name] in Treatment: 30 Wound Status Wound Number: 1 [name] Etiology: Trauma, Other Wound Location: Left Ankle Wound Status: Open Wounding Event: Trauma Date Acquired: 10/14/2020 Weeks Of Treatment: 30 Clustered Wound: No Photos Wound Measurements Length: (cm) 3.4 Width: (cm) 7.3 Depth: (cm) 0.2 Area: (cm) 19.494 Volume: (cm) 3.899 % Reduction in Area: 68.3% % Reduction in Volume: 84.2% Epithelialization: Medium (34-66%) Tunneling: No Undermining: No Wound Description Classification: Full Thickness Without Exposed Support Structures Wound Margin: Distinct, outline attached Exudate Amount: Medium Exudate Type: Purulent Exudate Color: yellow, brown, green Foul Odor After Cleansing: Yes Due to Product Use: No Slough/Fibrino Yes Wound Bed Granulation Amount: Medium (34-66%) Exposed Structure Granulation Quality: Red, Pink Fascia Exposed: No Necrotic Amount: Medium (34-66%) Fat Layer (Subcutaneous Tissue) Exposed: Yes Necrotic Quality: Adherent Slough Tendon Exposed: No Muscle Exposed: No Joint Exposed: No Bone Exposed: No Treatment Notes Wound #1 (Ankle) Wound Laterality: Left Cleanser Soap and Water Discharge Instruction: May shower and wash wound with dial antibacterial soap and water prior to dressing change. Wound Cleanser Discharge Instruction: Cleanse the wound with wound cleanser prior to  applying a clean dressing using gauze sponges, not tissue or cotton balls. Peri-Wound Care Triamcinolone 15 (g) Discharge Instruction: T wound and leg o Zinc Oxide Ointment 30g tube Discharge Instruction: Apply Zinc Oxide to periwound with each dressing change Sween Lotion (Moisturizing lotion) Discharge Instruction: Apply moisturizing lotion as directed Topical [name] Dressing KerraCel Ag Gelling Fiber Dressing, 4x5 in (silver alginate) Discharge Instruction: Apply silver alginate to wound bed as instructed Secondary Dressing ABD Pad, 8x10 Discharge Instruction: Apply over [name] dressing as directed. Zetuvit Plus 4x8 in Discharge Instruction: Apply over [name] dressing as directed. CarboFLEX Odor Control Dressing, 4x4 in Discharge Instruction: Apply over [name] dressing as directed. Secured With Compression Wrap ThreePress (3 layer compression wrap) Discharge Instruction: Apply three layer compression as directed. Compression Stockings Add-Ons Electronic Signature(s) Signed: 09/07/2021 2:05:18 PM By: [name] Abts RN, BSN Entered By: [name] on 09/03/2021 13:16:43 -------------------------------------------------------------------------------- Vitals Details Patient Name: Date of Service: [name], [name] [name] 09/03/2021 1:00 PM Medical Record Number: [id number] Patient Account Number: [id number] Date of Birth/Sex: Treating RN: 1976/07/18 (46 y.o. [name] Care Lanell Carpenter: [name] Other Clinician: Referring Emree Locicero: Treating Akari Crysler/Extender: [name] in Treatment: 30 Vital Signs Time Taken: 13:08 Temperature (F): 97.8 Pulse (bpm): 64 Respiratory Rate (breaths/min): 18 Blood Pressure (mmHg): 138/78 Reference Range: 80 - 120 mg / dl Electronic Signature(s) Signed: 09/07/2021 2:05:18 PM By: [name] Abts RN, BSN Entered By: [name] on 09/03/2021 13:09:21

## 2021-09-07 NOTE — Progress Notes (Signed)
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 He has no issues or complaints today. He denies signs of infection. 03/10/2021 upon evaluation today patient appears to be doing okay in regard to his wound. This is measuring [name] little bit smaller. Does have [name] lot of slough and biofilm noted on the surface of the wound. I do believe that sharp debridement would be of benefit for him. 8/23; 3 and half weeks since I last saw this man. Quite an improvement. I note the biopsy I did was nonspecific stains for Mycobacterium and fungi were negative. He has been following with Dr. Timmothy Euler who is been helpful prescribing clarithromycin and Bactrim. He has now completed this. He also had arterial and venous studies. His arterial study on the right showed an ABI of 1.10 with [name] TBI of 1.08 on the left unfortunately they did not remove the bandages but his TBI was 0.73 which is normal. He also had venous reflux studies these showed evidence of venous reflux at the greater saphenous vein at the saphenofemoral junction as well as the greater saphenous vein proximally in the thigh but no reflux in the calf Things are quite [name] bit better than the last time I saw him although the progress is slow. We have been using silver alginate. 8/30; generally continuing improvement in surface area and condition of the wound surface we have been using Hydrofera Blue under compression. The patient's only complaint through the [place] interpreter is that he has some degree of itching 9/6; continued improvement in overall surface area down 1 cm in width we have been using Hydrofera Blue. We have interviewed him through [name]  [place] interpreter today. He reports no additional issues 9/13 not much change in surface area today. We have been using Hydrofera Blue. He was interviewed through the [place] interpreter today. Still have him under compression. We used MolecuLight imaging 9/20; the wound is actually larger in its width. Also noted an odor and drainage. I used Iodoflex last time to help with the debris on the surface. He is not on any antibiotics. We did this interview through the [place] interpreter 9/27; better and with today. Odor and drainage seems better. We use silver alginate last time and that seems to have helped. We used his neighbor his [place] interpreter 10/4; improved length and improved condition of the wound bed. We have been using silver alginate. We interviewed him through his [place] interpreter. I am going to have vein and vascular look at this including his reflux studies. He came into the clinic with [name] very angry inflamed wound that admitted there for many  months. This now looks [name] lot better. He did not have anything in the calf on the left that had significant reflux although he did have it in his thigh. I want to make sure that everything can be done for this man to prevent this from reoccurring He has Medicaid and we might be able to order him [name] TheraSkin for an advanced treatment option. We will look into this. 10/14; patient comes in after [name] 10-day hiatus. Drainage weeping through his wrap. Marked malodor although the surface of the wound does not look so bad and dimensions are about the same. Through the interpreter on the phone he is not complaining of pain 10/20; wound surface covered in fibrinous debris. This is largely on the lateral part of his foot. We interviewed him through [name] interpreter on the phone [name] little more drainage reported by our nurses. We have been using silver alginate under compression with sit to fit and CarboFlex He has been to see infectious disease Dr.  Luciana Axe. Noted that he has been on Bactrim and clarithromycin for possible mycobacterial or other indolent infection. I am not sure if he is still taking antibiotics but these are listed as being discontinued and by infectious disease 10/27; our intake nurse reported large amount of drainage today more than usual. We have been using silver alginate. He still has not seen vein and vascular about the reflux studies I am not sure what the issue is here. He is very itchy under the wound on the left lateral foot The patient comes into clinic concerned that the 1 year of Medicaid that apparently was assigned to him when he entered the [place]. This is now coming to an end. I told him that I thought the best thing to do is the county social services i.e. [facility] social services I am not sure how else to help him with this. We of course will not discharge him which I think was his concern. He does have an appointment with Dr. Myra Gianotti on 11/7 with regards to the reflux studies. 11/8; the patient saw Dr. Myra Gianotti who noted mild at the saphenofemoral junction on the right but he did not feel that the vein was pathologic and he did not feel he would benefit from laser ablation. Suggested continuing to focus on wound care. We are using silver alginate with Bactroban 11/17; wound looks about the same. Still [name] fair amount of drainage here. Although the wound is coming in surface area it still [name] deep wound full-thickness. I am using silver alginate with Bactroban He really applied for Medicaid. Wondering about [name] skin graft. I am uncertain about that right now because of the drainage 12/1; wound is measuring slightly smaller in width. Surface of this looks better. Changed him to [facility] still using topical Bactroban 12/8; no major change in dimensions although the surface looks excellent we have been using Bactroban and covering Hydrofera Blue. Considering application for TheraSkin if it is available  through his version of Medicaid 12/15; nice healthy appearing wound advancing epithelialization 12/22; improvement in surface area using Bactroban under Hydrofera Blue. Originally [name] difficult large wound likely secondary to chronic venous insufficiency 08/06/2021; no major change in surface area. We are using Bactroban under Hydrofera Blue 08/19/2021; we are using Sorbact with covering calcium alginate and attempt to get [name] better looking wound surface with less debris.Still under compression He is denied for TheraSkin by his version of Medicaid. This is in it self not that surprising 1/26;  using Sorbact with covering silver alginate. Surfaces look better except for the lateral part of the left ankle wound. With the efforts of our staff we have him approved for TheraSkin through [facility] [previously we did not run the correct Medicaid version] 2/2; using Sorbact. Unfortunately the patient comes in with [name] large area of necrotic debris very malodorous. No clear surrounding infection. He is approved for TheraSkin but the wound bed just is not ready for that at this point. Objective Constitutional Sitting or standing Blood Pressure is within target range for patient.. Pulse regular and within target range for patient.Marland Kitchen Respirations regular, non-labored and within target range.. Temperature is normal and within the target range for the patient.Marland Kitchen Appears in no distress. Vitals Time Taken: 1:08 PM, Temperature: 97.8 F, Pulse: 64 bpm, Respiratory Rate: 18 breaths/min, Blood Pressure: 138/78 mmHg. General Notes: 1. Pedal pulses palpable no major edema no 2. Unfortunately considerable degree of necrotic surface here. I used [name] #5 curette for [name] difficult debridement there is an odor. 3. He does have [name] bridge of skin transversing the wound was on the optimistic side. 4. Post debridement I used [name] #5 curette to obtain [name] specimen for PCR culture. I am looking for [name] compounded antibiotic perhaps Integumentary (Hair,  Skin) Wound #1 status is Open. Original cause of wound was Trauma. The date acquired was: 10/14/2020. The wound has been in treatment 30 weeks. The wound is located on the Left Ankle. The wound measures 3.4cm length x 7.3cm width x 0.2cm depth; 19.494cm^2 area and 3.899cm^3 volume. There is Fat Layer (Subcutaneous Tissue) exposed. There is no tunneling or undermining noted. There is [name] medium amount of purulent drainage noted. Foul odor after cleansing was noted. The wound margin is distinct with the outline attached to the wound base. There is medium (34-66%) red, pink granulation within the wound bed. There is [name] medium (34-66%) amount of necrotic tissue within the wound bed including Adherent Slough. Assessment Active Problems ICD-10 Non-pressure chronic ulcer of left ankle with other specified severity Chronic venous hypertension (idiopathic) with ulcer and inflammation of left lower extremity Procedures Wound #1 Pre-procedure diagnosis of Wound #1 is [name] Trauma, Other located on the Left Ankle . There was [name] Excisional Skin/Subcutaneous Tissue Debridement with [name] total area of 24.82 sq cm performed by [name] Caul., MD. With the following instrument(s): Curette to remove Viable and Non-Viable tissue/material. Material removed includes Subcutaneous Tissue and Slough and. 1 specimen was taken by [name] Tissue Culture and sent to the lab per facility protocol. [name] time out was conducted at 13:27, prior to the start of the procedure. [name] Moderate amount of bleeding was controlled with Pressure. The procedure was tolerated well with [name] pain level of 5 throughout and [name] pain level of 3 following the procedure. Post Debridement Measurements: 3.4cm length x 7.3cm width x 0.2cm depth; 3.899cm^3 volume. Character of Wound/Ulcer Post Debridement is improved. Post procedure Diagnosis Wound #1: Same as Pre-Procedure Pre-procedure diagnosis of Wound #1 is [name] Trauma, Other located on the Left Ankle . There was [name] Three  Layer Compression Therapy Procedure by Zandra Abts, RN. Post procedure Diagnosis Wound #1: Same as Pre-Procedure Plan Follow-up Appointments: Return Appointment in 1 week. - with Dr. Leanord Hawking - Interpreter Required Cellular or Tissue Based Products: Cellular or Tissue Based Product Type: - Theraskin is COVERED. $4 CO-PAY PER APPLICATION Bathing/ Shower/ Hygiene: May shower with protection but do not get wound dressing(s) wet. Edema Control - Lymphedema / SCD / Other: Elevate legs  to the level of the heart or above for 30 minutes daily and/or when sitting, [name] frequency of: - 3-4 times [name] day throughout the day. Avoid standing for long periods of time. Exercise regularly Off-Loading: Open toe surgical shoe to: - left foot Laboratory ordered were: Anerobic culture (PCR) - Left ankle WOUND #1: - Ankle Wound Laterality: Left Cleanser: Soap and Water 1 x Per Week/ Discharge Instructions: May shower and wash wound with dial antibacterial soap and water prior to dressing change. Cleanser: Wound Cleanser 1 x Per Week/ Discharge Instructions: Cleanse the wound with wound cleanser prior to applying [name] clean dressing using gauze sponges, not tissue or cotton balls. Peri-Wound Care: Triamcinolone 15 (g) 1 x Per Week/ Discharge Instructions: T wound and leg o Peri-Wound Care: Zinc Oxide Ointment 30g tube 1 x Per Week/ Discharge Instructions: Apply Zinc Oxide to periwound with each dressing change Peri-Wound Care: Sween Lotion (Moisturizing lotion) 1 x Per Week/ Discharge Instructions: Apply moisturizing lotion as directed Prim Dressing: KerraCel Ag Gelling Fiber Dressing, 4x5 in (silver alginate) 1 x Per Week/ ary Discharge Instructions: Apply silver alginate to wound bed as instructed Secondary Dressing: ABD Pad, 8x10 1 x Per Week/ Discharge Instructions: Apply over primary dressing as directed. Secondary Dressing: Zetuvit Plus 4x8 in 1 x Per Week/ Discharge Instructions: Apply over primary  dressing as directed. Secondary Dressing: CarboFLEX Odor Control Dressing, 4x4 in 1 x Per Week/ Discharge Instructions: Apply over primary dressing as directed. Com pression Wrap: ThreePress (3 layer compression wrap) 1 x Per Week/ Discharge Instructions: Apply three layer compression as directed. 1. Change the dressing back to pure silver alginate 2. PCR culture. My aim would be to look at [name] compounded antibiotic. We may have to bring him back in the clinic in order to apply this frequently enough. 3. Post debridement I vigorously wash this off with wound cleanser. No evidence of surrounding infection in this point no need for systemic antibiotics Electronic Signature(s) Signed: 09/03/2021 4:50:25 PM By: Baltazar Najjar MD Entered By: Baltazar Najjar on 09/03/2021 13:39:58 -------------------------------------------------------------------------------- SuperBill Details Patient Name: Date of Service: [name] NA [name] 09/03/2021 Medical Record Number: [id number] Patient Account Number: [id number] Date of Birth/Sex: Treating RN: [birth year] (46 y.o. [name] Primary Care Provider: Shelby [name] Other Clinician: Referring Provider: Treating Provider/Extender: [name] in Treatment: 30 Diagnosis Coding ICD-10 Codes Code Description [phone] Non-pressure chronic ulcer of left ankle with other specified severity I87.332 Chronic venous hypertension (idiopathic) with ulcer and inflammation of left lower extremity Facility Procedures CPT4 Code: [id number] Description: 11042 - DEB SUBQ TISSUE 20 SQ CM/< ICD-10 Diagnosis Description L97.328 Non-pressure chronic ulcer of left ankle with other specified severity Modifier: Quantity: 1 CPT4 Code: [id number] Description: 11045 - DEB SUBQ TISS EA ADDL 20CM ICD-10 Diagnosis Description L97.328 Non-pressure chronic ulcer of left ankle with other specified severity Modifier: Quantity: 1 Physician Procedures : CPT4  Code Description Modifier [id number] 11042 - WC PHYS SUBQ TISS 20 SQ CM ICD-10 Diagnosis Description L97.328 Non-pressure chronic ulcer of left ankle with other specified severity Quantity: 1 : [id number] 11045 - WC PHYS SUBQ TISS EA ADDL 20 CM ICD-10 Diagnosis Description L97.328 Non-pressure chronic ulcer of left ankle with other specified severity Quantity: 1 Electronic Signature(s) Signed: 09/03/2021 4:50:25 PM By: Baltazar Najjar MD Entered By: Baltazar Najjar on 09/03/2021 13:40:14

## 2021-09-10 ENCOUNTER — Encounter (HOSPITAL_BASED_OUTPATIENT_CLINIC_OR_DEPARTMENT_OTHER): Payer: Medicaid Other | Admitting: Internal Medicine

## 2021-09-10 ENCOUNTER — Other Ambulatory Visit: Payer: Self-pay

## 2021-09-10 DIAGNOSIS — F17200 Nicotine dependence, unspecified, uncomplicated: Secondary | ICD-10-CM | POA: Diagnosis not present

## 2021-09-10 DIAGNOSIS — L97328 Non-pressure chronic ulcer of left ankle with other specified severity: Secondary | ICD-10-CM | POA: Diagnosis not present

## 2021-09-10 DIAGNOSIS — L97322 Non-pressure chronic ulcer of left ankle with fat layer exposed: Secondary | ICD-10-CM | POA: Diagnosis not present

## 2021-09-10 DIAGNOSIS — I87332 Chronic venous hypertension (idiopathic) with ulcer and inflammation of left lower extremity: Secondary | ICD-10-CM | POA: Diagnosis not present

## 2021-09-10 NOTE — Progress Notes (Signed)
[name], [name] ([id number]) Visit Report for 09/10/2021 Arrival Information Details Patient Name: Date of Service: [name] [place] NIE 09/10/2021 1:30 PM Medical Record Number: [id number] Patient Account Number: [id number] Date of Birth/Sex: Treating RN: [birth year] (46 y.o. Daphane Shepherd, Emeterio Reeve Primary Care Jafar Poffenberger: [name] Other Clinician: Referring Deitrick Ferreri: Treating Aryana Wonnacott/Extender: Darleen Crocker in Treatment: 31 Visit Information History Since Last Visit Added or deleted any medications: No Patient Arrived: Ambulatory Any new allergies or adverse reactions: No Arrival Time: 14:21 Had [name] fall or experienced change in No Accompanied By: interpreter activities of daily living that may affect Transfer Assistance: None risk of falls: Patient Identification Verified: Yes Signs or symptoms of abuse/neglect since last visito No Secondary Verification Process Completed: Yes Hospitalized since last visit: No Patient Requires Transmission-Based Precautions: No Implantable device outside of the clinic excluding No Patient Has Alerts: No cellular tissue based products placed in the center since last visit: Has Dressing in Place as Prescribed: Yes Has Compression in Place as Prescribed: Yes Pain Present Now: No Electronic Signature(s) Signed: 09/10/2021 6:05:40 PM By: Zandra Abts RN, BSN Entered By: Zandra Abts on 09/10/2021 14:22:42 -------------------------------------------------------------------------------- Compression Therapy Details Patient Name: Date of Service: [name] 09/10/2021 1:30 PM Medical Record Number: [id number] Patient Account Number: [id number] Date of Birth/Sex: Treating RN: 1976-04-08 (46 y.o. [name] Primary Care Hanley Rispoli: [name] Other Clinician: Referring Deano Tomaszewski: Treating Bary Limbach/Extender: Darleen Crocker in Treatment: 31 Compression Therapy Performed for Wound  Assessment: Wound #1 Left Ankle Performed By: Clinician Zandra Abts, RN Compression Type: Three Layer Post Procedure Diagnosis Same as Pre-procedure Electronic Signature(s) Signed: 09/10/2021 6:05:40 PM By: Zandra Abts RN, BSN Entered By: Zandra Abts on 09/10/2021 14:45:45 -------------------------------------------------------------------------------- Encounter Discharge Information Details Patient Name: Date of Service: [name] 09/10/2021 1:30 PM Medical Record Number: [id number] Patient Account Number: [id number] Date of Birth/Sex: Treating RN: 10-19-1975 (46 y.o. [name] Primary Care Jaritza Duignan: [name] Other Clinician: Referring Ranay Ketter: Treating Lessie Manigo/Extender: Darleen Crocker in Treatment: 31 Encounter Discharge Information Items Post Procedure Vitals Discharge Condition: Stable Temperature (F): 98.7 Ambulatory Status: Ambulatory Pulse (bpm): 84 Discharge Destination: Home Respiratory Rate (breaths/min): 16 Transportation: Private Auto Blood Pressure (mmHg): 107/50 Accompanied By: interpreter Schedule Follow-up Appointment: Yes Clinical Summary of Care: Patient Declined Electronic Signature(s) Signed: 09/10/2021 6:05:40 PM By: Zandra Abts RN, BSN Entered By: Zandra Abts on 09/10/2021 17:10:20 -------------------------------------------------------------------------------- Lower Extremity Assessment Details Patient Name: Date of Service: [name] 09/10/2021 1:30 PM Medical Record Number: [id number] Patient Account Number: [id number] Date of Birth/Sex: Treating RN: 1976-03-28 (46 y.o. [name] Primary Care Aolanis Crispen: [name] Other Clinician: Referring Avelino Herren: Treating Penne Rosenstock/Extender: Darleen Crocker in Treatment: 31 Edema Assessment Assessed: [name]: No] [name]: No] Edema: [Left: N] [Right: o] Calf Left: Right: Point of Measurement: 28 cm From  Medial Instep 33 cm Ankle Left: Right: Point of Measurement: 8 cm From Medial Instep 23 cm Vascular Assessment Pulses: Dorsalis Pedis Palpable: [Left:Yes] Electronic Signature(s) Signed: 09/10/2021 6:05:40 PM By: Zandra Abts RN, BSN Entered By: Zandra Abts on 09/10/2021 14:32:05 -------------------------------------------------------------------------------- Multi Wound Chart Details Patient Name: Date of Service: [name] 09/10/2021 1:30 PM Medical Record Number: [id number] Patient Account Number: [id number] Date of Birth/Sex: Treating RN: [birth year] (46 y.o. [name] Primary Care Wilder Kurowski: [name] Other Clinician: Referring Keyshawn Hellwig: Treating Makailey Hodgkin/Extender: Darleen Crocker in Treatment: 31 Vital Signs Height(in): Pulse(bpm): 84 Weight(lbs): Blood  Pressure(mmHg): 107/50 Body Mass Index(BMI): Temperature(F): 98.7 Respiratory Rate(breaths/min): 16 Photos: [[name]:[name]] Left Ankle [name] [name] Wound Location: Trauma [name] [name] Wounding Event: Trauma, Other [name] [name] Primary Etiology: 10/14/2020 [name] [name] Date Acquired: 39 [name] [name] Weeks of Treatment: Open [name] [name] Wound Status: No [name] [name] Wound Recurrence: 3.3x7x0.2 [name] [name] Measurements L x W x D (cm) 18.143 [name] [name] [name] (cm) : rea 3.629 [name] [name] Volume (cm) : 70.50% [name] [name] % Reduction in [name] rea: 85.30% [name] [name] % Reduction in Volume: Full Thickness Without Exposed [name] [name] Classification: Support Structures Medium [name] [name] Exudate Amount: Purulent [name] [name] Exudate Type: yellow, brown, green [name] [name] Exudate Color: Yes [name] [name] Foul Odor [name] Cleansing: fter No [name] [name] Odor Anticipated Due to Product Use: Distinct, outline attached [name] [name] Wound Margin: Medium (34-66%) [name] [name] Granulation [name] mount: Red, Pink [name] [name] Granulation Quality: Medium (34-66%) [name] [name] Necrotic Amount: Fat Layer (Subcutaneous Tissue): Yes [name] [name] Exposed Structures: Fascia: No Tendon:  No Muscle: No Joint: No Bone: No Medium (34-66%) [name] [name] Epithelialization: Debridement - Excisional [name] [name] Debridement: Pre-procedure Verification/Time Out 14:43 [name] [name] Taken: Subcutaneous, Slough [name] [name] Tissue Debrided: Skin/Subcutaneous Tissue [name] [name] Level: 16.5 [name] [name] Debridement [name] (sq cm): rea Curette [name] [name] Instrument: Minimum [name] [name] Bleeding: Pressure [name] [name] Hemostasis [name] chieved: 4 [name] [name] Procedural Pain: 2 [name] [name] Post Procedural Pain: Procedure was tolerated well [name] [name] Debridement Treatment Response: 3.3x7x0.2 [name] [name] Post Debridement Measurements L x W x D (cm) 3.629 [name] [name] Post Debridement Volume: (cm) Compression Therapy [name] [name] Procedures Performed: Debridement Treatment Notes Electronic Signature(s) Signed: 09/10/2021 5:29:10 PM By: Baltazar Najjar MD Signed: 09/10/2021 6:05:40 PM By: Zandra Abts RN, BSN Entered By: Baltazar Najjar on 09/10/2021 15:12:42 -------------------------------------------------------------------------------- Multi-Disciplinary Care Plan Details Patient Name: Date of Service: [name] 09/10/2021 1:30 PM Medical Record Number: [id number] Patient Account Number: [id number] Date of Birth/Sex: Treating RN: 1976/02/25 (46 y.o. [name] Primary Care Marasia Newhall: [name] Other Clinician: Referring Jasenia Weilbacher: Treating Gerhard Rappaport/Extender: Darleen Crocker in Treatment: 31 Multidisciplinary Care Plan reviewed with physician Active Inactive Wound/Skin Impairment Nursing Diagnoses: Knowledge deficit related to ulceration/compromised skin integrity Goals: Patient/caregiver will verbalize understanding of skin care regimen Date Initiated: 02/05/2021 Target Resolution Date: 10/03/2021 Goal Status: Active Interventions: Assess patient/caregiver ability to obtain necessary supplies Assess patient/caregiver ability to perform ulcer/skin care regimen upon admission and as  needed Provide education on ulcer and skin care Treatment Activities: Skin care regimen initiated : 02/05/2021 Topical wound management initiated : 02/05/2021 Notes: 03/31/21: Wound care regimen ongoing, target date extended. 04/21/21: Wound care ongoing, through interpreter patient states he is doing fine with his dressing changes. Electronic Signature(s) Signed: 09/10/2021 6:05:40 PM By: Zandra Abts RN, BSN Entered By: Zandra Abts on 09/10/2021 14:41:05 -------------------------------------------------------------------------------- Pain Assessment Details Patient Name: Date of Service: [name] 09/10/2021 1:30 PM Medical Record Number: [id number] Patient Account Number: [id number] Date of Birth/Sex: Treating RN: 1975-11-03 (46 y.o. [name] Primary Care Ezekial Arns: [name] Other Clinician: Referring Rozlynn Lippold: Treating Klyn Kroening/Extender: Darleen Crocker in Treatment: 31 Active Problems Location of Pain Severity and Description of Pain Patient Has Paino No Site Locations Pain Management and Medication Current Pain Management: Electronic Signature(s) Signed: 09/10/2021 6:05:40 PM By: Zandra Abts RN, BSN Entered By: Zandra Abts on 09/10/2021 14:23:09 -------------------------------------------------------------------------------- Patient/Caregiver Education Details Patient Name: Date of Service: [name] 2/9/2023andnbsp1:30 PM Medical Record Number: [id number] Patient Account  Number: [id number] Date of Birth/Gender: Treating RN: Nov 25, 1975 (46 y.o. [name] Primary Care Physician: [name] Other Clinician: Referring Physician: Treating Physician/Extender: Darleen Crocker in Treatment: 31 Education Assessment Education Provided To: Patient Education Topics Provided Wound/Skin Impairment: Methods: Explain/Verbal Responses: State content correctly [employer]) Signed: 09/10/2021 6:05:40 PM By: Zandra Abts RN, BSN Entered By: Zandra Abts on 09/10/2021 14:41:37 -------------------------------------------------------------------------------- Wound Assessment Details Patient Name: Date of Service: [name] 09/10/2021 1:30 PM Medical Record Number: [id number] Patient Account Number: [id number] Date of Birth/Sex: Treating RN: 1976/03/26 (46 y.o. [name] Primary Care Malya Cirillo: [name] Other Clinician: Referring Aiyanah Kalama: Treating Bienvenido Proehl/Extender: Darleen Crocker in Treatment: 31 Wound Status Wound Number: 1 Primary Etiology: Trauma, Other Wound Location: Left Ankle Wound Status: Open Wounding Event: Trauma Date Acquired: 10/14/2020 Weeks Of Treatment: 31 Clustered Wound: No Photos Wound Measurements Length: (cm) 3.3 Width: (cm) 7 Depth: (cm) 0.2 Area: (cm) 18.143 Volume: (cm) 3.629 % Reduction in Area: 70.5% % Reduction in Volume: 85.3% Epithelialization: Medium (34-66%) Tunneling: No Undermining: No Wound Description Classification: Full Thickness Without Exposed Support Structures Wound Margin: Distinct, outline attached Exudate Amount: Medium Exudate Type: Purulent Exudate Color: yellow, brown, green Foul Odor After Cleansing: Yes Due to Product Use: No Slough/Fibrino Yes Wound Bed Granulation Amount: Medium (34-66%) Exposed Structure Granulation Quality: Red, Pink Fascia Exposed: No Necrotic Amount: Medium (34-66%) Fat Layer (Subcutaneous Tissue) Exposed: Yes Necrotic Quality: Adherent Slough Tendon Exposed: No Muscle Exposed: No Joint Exposed: No Bone Exposed: No Treatment Notes Wound #1 (Ankle) Wound Laterality: Left Cleanser Soap and Water Discharge Instruction: May shower and wash wound with dial antibacterial soap and water prior to dressing change. Wound Cleanser Discharge Instruction: Cleanse the wound with wound cleanser prior to  applying [name] clean dressing using gauze sponges, not tissue or cotton balls. Peri-Wound Care Triamcinolone 15 (g) Discharge Instruction: T wound and leg o Zinc Oxide Ointment 30g tube Discharge Instruction: Apply Zinc Oxide to periwound with each dressing change Sween Lotion (Moisturizing lotion) Discharge Instruction: Apply moisturizing lotion as directed Topical Gentamicin Discharge Instruction: Apply directly to wound bed, under silver alginate Primary Dressing KerraCel Ag Gelling Fiber Dressing, 4x5 in (silver alginate) Discharge Instruction: Apply silver alginate to wound bed as instructed Secondary Dressing ABD Pad, 8x10 Discharge Instruction: Apply over primary dressing as directed. Zetuvit Plus 4x8 in Discharge Instruction: Apply over primary dressing as directed. CarboFLEX Odor Control Dressing, 4x4 in Discharge Instruction: Apply over primary dressing as directed. Secured With Compression Wrap ThreePress (3 layer compression wrap) Discharge Instruction: Apply three layer compression as directed. Compression Stockings Add-Ons Electronic Signature(s) Signed: 09/10/2021 6:05:40 PM By: Zandra Abts RN, BSN Entered By: Zandra Abts on 09/10/2021 14:33:24 -------------------------------------------------------------------------------- Vitals Details Patient Name: Date of Service: [name], [name] [name] 09/10/2021 1:30 PM Medical Record Number: [id number] Patient Account Number: [id number] Date of Birth/Sex: Treating RN: 1975-12-15 (46 y.o. [name] Primary Care Amran Malter: [name] Other Clinician: Referring Jes Costales: Treating Greidy Sherard/Extender: Darleen Crocker in Treatment: 31 Vital Signs Time Taken: 14:21 Temperature (F): 98.7 Pulse (bpm): 84 Respiratory Rate (breaths/min): 16 Blood Pressure (mmHg): 107/50 Reference Range: 80 - 120 mg / dl Electronic Signature(s) Signed: 09/10/2021 6:05:40 PM By: Zandra Abts RN, BSN Entered  By: Zandra Abts on 09/10/2021 14:24:14

## 2021-09-10 NOTE — Progress Notes (Signed)
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 of this wound today. 8/5; interpreter present over the phone. Patient reports improvement in wound healing. He is currently taking the antibiotics prescribed by Dr. Luciana Axe (infectious disease). He has no issues or complaints today. He denies signs of infection. 03/10/2021 upon evaluation today patient appears to be doing okay in regard to his wound. This is measuring [name] little bit smaller. Does have [name] lot of slough and biofilm noted on the surface of the wound. I do believe that sharp debridement would be of benefit for him. 8/23; 3 and half weeks since I last saw this man. Quite an improvement. I note the biopsy I did was nonspecific stains for Mycobacterium and fungi were negative. He has been following with Dr. Timmothy Euler who is been helpful prescribing clarithromycin and Bactrim. He has now completed this. He also had arterial and venous studies. His arterial study on the right showed an ABI of 1.10 with [name] TBI of 1.08 on the left unfortunately they did not remove the bandages but his TBI was 0.73 which is normal. He also had venous reflux studies these showed evidence of venous reflux at the greater saphenous vein at the saphenofemoral junction as well as the greater saphenous vein proximally in the thigh but no reflux in the calf Things are quite [name] bit better than the last time I saw him although the progress is slow. We have been using silver alginate. 8/30; generally continuing improvement in surface area and condition of the wound surface we have been  using Hydrofera Blue under compression. The patient's only complaint through the [place] interpreter is that he has some degree of itching 9/6; continued improvement in overall surface area down 1 cm in width we have been using Hydrofera Blue. We have interviewed him through [name] [place] interpreter today. He reports no additional issues 9/13 not much change in surface area today. We have been using Hydrofera Blue. He was interviewed through the [place] interpreter today. Still have him under compression. We used [employer] imaging 9/20; the wound is actually larger in its width. Also noted an odor and drainage. I used Iodoflex last time to help with the debris on the surface. He is not on any antibiotics. We did this interview through the [place] interpreter 9/27; better and with today. Odor and drainage seems better. We use silver alginate last time and that seems to have helped. We used his neighbor his [place] interpreter 10/4; improved length and improved condition of the wound bed. We have  been using silver alginate. We interviewed him through his [place] interpreter. I am going to have vein and vascular look at this including his reflux studies. He came into the clinic with [name] very angry inflamed wound that admitted there for many months. This now looks [name] lot better. He did not have anything in the calf on the left that had significant reflux although he did have it in his thigh. I want to make sure that everything can be done for this man to prevent this from reoccurring He has Medicaid and we might be able to order him [name] TheraSkin for an advanced treatment option. We will look into this. 10/14; patient comes in after [name] 10-day hiatus. Drainage weeping through his wrap. Marked malodor although the surface of the wound does not look so bad and dimensions are about the same. Through the interpreter on the phone he is not complaining of pain 10/20; wound surface covered in fibrinous  debris. This is largely on the lateral part of his foot. We interviewed him through [name] interpreter on the phone [name] little more drainage reported by our nurses. We have been using silver alginate under compression with sit to fit and CarboFlex He has been to see infectious disease Dr. Luciana Axeomer. Noted that he has been on Bactrim and clarithromycin for possible mycobacterial or other indolent infection. I am not sure if he is still taking antibiotics but these are listed as being discontinued and by infectious disease 10/27; our intake nurse reported large amount of drainage today more than usual. We have been using silver alginate. He still has not seen vein and vascular about the reflux studies I am not sure what the issue is here. He is very itchy under the wound on the left lateral foot The patient comes into clinic concerned that the 1 year of Medicaid that [place] This is now coming to an end. I told him that I thought the best thing to do is the [facility] I am not sure how else to help him with this. We of course will not discharge him which I think was his concern. He does have an appointment with Dr. Myra GianottiBrabham on 11/7 with regards to the reflux studies. 11/8; the patient saw Dr. Myra GianottiBrabham who noted mild at the saphenofemoral junction on the right but he did not feel that the vein was pathologic and he did not feel he would benefit from laser ablation. Suggested continuing to focus on wound care. We are using silver alginate with Bactroban 11/17; wound looks about the same. Still [name] fair amount of drainage here. Although the wound is coming in surface area it still [name] deep wound full-thickness. I am using silver alginate with Bactroban He really applied for Medicaid. Wondering about [name] skin graft. I am uncertain about that right now because of the drainage 12/1; wound is measuring slightly smaller in  width. Surface of this looks better. [facility] still using topical Bactroban 12/8; no major change in dimensions although the surface looks excellent we have been using Bactroban and covering Hydrofera Blue. Considering application for TheraSkin if it is available through his version of Medicaid 12/15; nice healthy appearing wound advancing epithelialization 12/22; improvement in surface area using Bactroban under Hydrofera Blue. Originally [name] difficult large wound likely secondary to chronic venous insufficiency 08/06/2021; no major change in surface area. We are using Bactroban under Hydrofera Blue  08/19/2021; we are using Sorbact with covering calcium alginate and attempt to get [name] better looking wound surface with less debris.Still under compression He is denied for TheraSkin by his version of Medicaid. This is in it self not that surprising 1/26; using Sorbact with covering silver alginate. Surfaces look better except for the lateral part of the left ankle wound. With the efforts of our staff we have him approved for TheraSkin through [facility] [previously we did not run the correct Medicaid version] 2/2; using Sorbact. Unfortunately the patient comes in with [name] large area of necrotic debris very malodorous. No clear surrounding infection. He is approved for TheraSkin but the wound bed just is not ready for that at this point. 2/9; because of the odor and debris last time we did not go ahead with Elgie Collard has [name] $4 affordable co-pay per application]. PCR culture I did last week showed high titers of E. coli moderate titers of Klebsiella and low titers of Pseudomonas Peptostreptococcus which is anaerobic. Does not have evidence of surrounding infection I have therefore elected to treat this with topical gentamicin under the silver alginate. Also with aggressive debridement Objective Constitutional Sitting or standing Blood Pressure is within target range for patient.. Pulse  regular and within target range for patient.Marland Kitchen Respirations regular, non-labored and within target range.. Temperature is normal and within the target range for the patient.Marland Kitchen Appears in no distress. Vitals Time Taken: 2:21 PM, Temperature: 98.7 F, Pulse: 84 bpm, Respiratory Rate: 16 breaths/min, Blood Pressure: 107/50 mmHg. General Notes: Wound exam; significant increase in odor noted. The lateral part of the wound needs an aggressive debridement with [name] #5 curette he has some epithelial localized areas medially which I was careful to avoid but still I thought debridement was necessary given the likely significant bioburden. No evidence of surrounding infection. Edema control was adequate Integumentary (Hair, Skin) Wound #1 status is Open. Original cause of wound was Trauma. The date acquired was: 10/14/2020. The wound has been in treatment 31 weeks. The wound is located on the Left Ankle. The wound measures 3.3cm length x 7cm width x 0.2cm depth; 18.143cm^2 area and 3.629cm^3 volume. There is Fat Layer (Subcutaneous Tissue) exposed. There is no tunneling or undermining noted. There is [name] medium amount of purulent drainage noted. Foul odor after cleansing was noted. The wound margin is distinct with the outline attached to the wound base. There is medium (34-66%) red, pink granulation within the wound bed. There is [name] medium (34-66%) amount of necrotic tissue within the wound bed including Adherent Slough. Assessment Active Problems ICD-10 Non-pressure chronic ulcer of left ankle with other specified severity Chronic venous hypertension (idiopathic) with ulcer and inflammation of left lower extremity Procedures Wound #1 Pre-procedure diagnosis of Wound #1 is [name] Trauma, Other located on the Left Ankle . There was [name] Excisional Skin/Subcutaneous Tissue Debridement with [name] total area of 16.5 sq cm performed by [name] Caul., MD. With the following instrument(s): Curette to remove Viable and  Non-Viable tissue/material. Material removed includes Subcutaneous Tissue, Slough, and Biofilm. No specimens were taken. [name] time out was conducted at 14:43, prior to the start of the procedure. [name] Minimum amount of bleeding was controlled with Pressure. The procedure was tolerated well with [name] pain level of 4 throughout and [name] pain level of 2 following the procedure. Post Debridement Measurements: 3.3cm length x 7cm width x 0.2cm depth; 3.629cm^3 volume. Character of Wound/Ulcer Post Debridement is improved. Post procedure Diagnosis Wound #1: Same as Pre-Procedure Pre-procedure diagnosis  of Wound #1 is [name] Trauma, Other located on the Left Ankle . There was [name] Three Layer Compression Therapy Procedure by [name] Abts, RN. Post procedure Diagnosis Wound #1: Same as Pre-Procedure Plan Follow-up Appointments: Return Appointment in 1 week. - Thursday with Dr. Leanord Hawking - Interpreter Required Nurse Visit: - Monday 2/13 for rewrap Cellular or Tissue Based Products: Cellular or Tissue Based Product Type: - Theraskin is COVERED. $4 CO-PAY PER APPLICATION Bathing/ Shower/ Hygiene: May shower with protection but do not get wound dressing(s) wet. Edema Control - Lymphedema / SCD / Other: Elevate legs to the level of the heart or above for 30 minutes daily and/or when sitting, [name] frequency of: - 3-4 times [name] day throughout the day. Avoid standing for long periods of time. Exercise regularly Off-Loading: Open toe surgical shoe to: - left foot WOUND #1: - Ankle Wound Laterality: Left Cleanser: Soap and Water 1 x Per Week/ Discharge Instructions: May shower and wash wound with dial antibacterial soap and water prior to dressing change. Cleanser: Wound Cleanser 1 x Per Week/ Discharge Instructions: Cleanse the wound with wound cleanser prior to applying [name] clean dressing using gauze sponges, not tissue or cotton balls. Peri-Wound Care: Triamcinolone 15 (g) 1 x Per Week/ Discharge Instructions: T wound and  leg o Peri-Wound Care: Zinc Oxide Ointment 30g tube 1 x Per Week/ Discharge Instructions: Apply Zinc Oxide to periwound with each dressing change Peri-Wound Care: Sween Lotion (Moisturizing lotion) 1 x Per Week/ Discharge Instructions: Apply moisturizing lotion as directed Topical: Gentamicin 1 x Per Week/ Discharge Instructions: Apply directly to wound bed, under silver alginate Prim Dressing: KerraCel Ag Gelling Fiber Dressing, 4x5 in (silver alginate) 1 x Per Week/ ary Discharge Instructions: Apply silver alginate to wound bed as instructed Secondary Dressing: ABD Pad, 8x10 1 x Per Week/ Discharge Instructions: Apply over primary dressing as directed. Secondary Dressing: Zetuvit Plus 4x8 in 1 x Per Week/ Discharge Instructions: Apply over primary dressing as directed. Secondary Dressing: CarboFLEX Odor Control Dressing, 4x4 in 1 x Per Week/ Discharge Instructions: Apply over primary dressing as directed. Com pression Wrap: ThreePress (3 layer compression wrap) 1 x Per Week/ Discharge Instructions: Apply three layer compression as directed. 1. I continued with the silver alginate but added gentamicin under the wound which should treat the gram-negative's topically. 2. Going to bring him back for [name] nurse visit to change the dressing and apply the gentamicin. 3. He is going to require ongoing mechanical debridements for probably the next 2 visits. 4. We have TheraSkin but I do not think the surface of this is ready for this yet. 5. I do not think he needs systemic antibiotics at this point Electronic Signature(s) Signed: 09/10/2021 5:29:10 PM By: [name] Najjar MD Entered By: [name] on 09/10/2021 15:19:07 -------------------------------------------------------------------------------- SuperBill Details Patient Name: Date of Service: [name] NA [name] 09/10/2021 Medical Record Number: [id number] Patient Account Number: [id number] Date of Birth/Sex: Treating RN: 1976-07-12 (46  y.o. [name] Primary Care Provider: Shelby [name] Other Clinician: Referring Provider: Treating Provider/Extender: [name] in Treatment: 31 Diagnosis Coding ICD-10 Codes Code Description [phone] Non-pressure chronic ulcer of left ankle with other specified severity I87.332 Chronic venous hypertension (idiopathic) with ulcer and inflammation of left lower extremity Facility Procedures CPT4 Code: [id number] Description: 11042 - DEB SUBQ TISSUE 20 SQ CM/< ICD-10 Diagnosis Description L97.328 Non-pressure chronic ulcer of left ankle with other specified severity Modifier: Quantity: 1 Physician Procedures : CPT4 Code Description Modifier [id number] [phone] -  WC PHYS SUBQ TISS 20 SQ CM ICD-10 Diagnosis Description L97.328 Non-pressure chronic ulcer of left ankle with other specified severity Quantity: 1 Electronic Signature(s) Signed: 09/10/2021 5:29:10 PM By: [name] Najjar MD Entered By: [name] on 09/10/2021 15:19:22

## 2021-09-14 ENCOUNTER — Encounter (HOSPITAL_BASED_OUTPATIENT_CLINIC_OR_DEPARTMENT_OTHER): Payer: Medicaid Other | Admitting: Internal Medicine

## 2021-09-14 ENCOUNTER — Other Ambulatory Visit: Payer: Self-pay

## 2021-09-14 DIAGNOSIS — I87332 Chronic venous hypertension (idiopathic) with ulcer and inflammation of left lower extremity: Secondary | ICD-10-CM | POA: Diagnosis not present

## 2021-09-14 DIAGNOSIS — L97328 Non-pressure chronic ulcer of left ankle with other specified severity: Secondary | ICD-10-CM | POA: Diagnosis not present

## 2021-09-14 DIAGNOSIS — F17200 Nicotine dependence, unspecified, uncomplicated: Secondary | ICD-10-CM | POA: Diagnosis not present

## 2021-09-15 NOTE — Progress Notes (Signed)
TAJON, MORING ([id number]) Visit Report for 09/14/2021 SuperBill Details Patient Name: Date of Service: [name] 09/14/2021 Medical Record Number: [id number] Patient Account Number: [id number] Date of Birth/Sex: Treating RN: 02-Nov-1975 (46 y.o. [name] Primary Care Provider: [name] Other Clinician: Referring Provider: Treating Provider/Extender: Lita Mains in Treatment: 31 Diagnosis Coding ICD-10 Codes Code Description [phone] Non-pressure chronic ulcer of left ankle with other specified severity I87.332 Chronic venous hypertension (idiopathic) with ulcer and inflammation of left lower extremity Facility Procedures CPT4 Code Description Modifier Quantity [phone] (Facility Use Only) [phone] - APPLY MULTLAY COMPRS LWR LT LEG 1 Electronic Signature(s) Signed: 09/14/2021 5:37:03 PM By: Zandra Abts RN, BSN Signed: 09/15/2021 9:23:45 AM By: Geralyn Corwin DO Entered By: Zandra Abts on 09/14/2021 17:20:49

## 2021-09-17 ENCOUNTER — Other Ambulatory Visit: Payer: Self-pay

## 2021-09-17 ENCOUNTER — Encounter (HOSPITAL_BASED_OUTPATIENT_CLINIC_OR_DEPARTMENT_OTHER): Payer: Medicaid Other | Admitting: Internal Medicine

## 2021-09-17 DIAGNOSIS — I87332 Chronic venous hypertension (idiopathic) with ulcer and inflammation of left lower extremity: Secondary | ICD-10-CM | POA: Diagnosis not present

## 2021-09-17 DIAGNOSIS — L97322 Non-pressure chronic ulcer of left ankle with fat layer exposed: Secondary | ICD-10-CM | POA: Diagnosis not present

## 2021-09-17 DIAGNOSIS — L97328 Non-pressure chronic ulcer of left ankle with other specified severity: Secondary | ICD-10-CM | POA: Diagnosis not present

## 2021-09-17 DIAGNOSIS — F17200 Nicotine dependence, unspecified, uncomplicated: Secondary | ICD-10-CM | POA: Diagnosis not present

## 2021-09-17 NOTE — Progress Notes (Signed)
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 He is currently taking the antibiotics prescribed by Dr. Luciana Axe (infectious disease). He has no issues or complaints today. He denies signs of infection. 03/10/2021 upon evaluation today patient appears to be doing okay in regard to his wound. This is measuring a little bit smaller. Does have a lot of slough and biofilm noted on the surface of the wound. I do believe that sharp debridement would be of benefit for him. 8/23; 3 and half weeks since I last saw this man. Quite an improvement. I note the biopsy I did was nonspecific stains for Mycobacterium and fungi were negative. He has been following with Dr. Timmothy Euler who is been helpful prescribing clarithromycin and Bactrim. He has now completed this. He also had arterial and venous studies. His arterial study on the right showed an ABI of 1.10 with a TBI of 1.08 on the left unfortunately they did not remove the bandages but his TBI was 0.73 which is normal. He also had venous reflux studies these showed evidence of venous reflux at the greater saphenous vein at the saphenofemoral junction as well as the greater saphenous vein proximally in the thigh but no reflux in the calf Things are quite a bit better than the last time I saw him although the progress is slow. We have been using silver alginate. 8/30; generally continuing  improvement in surface area and condition of the wound surface we have been using Hydrofera Blue under compression. The patient's only complaint through the [place] interpreter is that he has some degree of itching 9/6; continued improvement in overall surface area down 1 cm in width we have been using Hydrofera Blue. We have interviewed him through a [place] interpreter today. He reports no additional issues 9/13 not much change in surface area today. We have been using Hydrofera Blue. He was interviewed through the [place] interpreter today. Still have him under compression. We used [employer]ole[employer] imaging 9/20; the wound is actually larger in its width. Also noted an odor and drainage. I used Iodoflex last time to help with the debris on the surface. He is not on any antibiotics. We did this interview through the [place] interpreter 9/27; better and with today. Odor and drainage seems better. We use silver alginate last time and that seems to have helped.  We used his neighbor his [place] interpreter 10/4; improved length and improved condition of the wound bed. We have been using silver alginate. We interviewed him through his [place] interpreter. I am going to have vein and vascular look at this including his reflux studies. He came into the clinic with a very angry inflamed wound that admitted there for many months. This now looks a lot better. He did not have anything in the calf on the left that had significant reflux although he did have it in his thigh. I want to make sure that everything can be done for this man to prevent this from reoccurring He has Medicaid and we might be able to order him a TheraSkin for an advanced treatment option. We will look into this. 10/14; patient comes in after a 10-day hiatus. Drainage weeping through his wrap. Marked malodor although the surface of the wound does not look so bad and dimensions are about the same. Through the interpreter on the phone  he is not complaining of pain 10/20; wound surface covered in fibrinous debris. This is largely on the lateral part of his foot. We interviewed him through a interpreter on the phone A little more drainage reported by our nurses. We have been using silver alginate under compression with sit to fit and CarboFlex He has been to see infectious disease Dr. Luciana Axe. Noted that he has been on Bactrim and clarithromycin for possible mycobacterial or other indolent infection. I am not sure if he is still taking antibiotics but these are listed as being discontinued and by infectious disease 10/27; our intake nurse reported large amount of drainage today more than usual. We have been using silver alginate. He still has not seen vein and vascular about the reflux studies I am not sure what the issue is here. He is very itchy under the wound on the left lateral foot The patient comes into clinic concerned that the 1 year of Medicaid that apparently was assigned to him when he entered the [place]. This is now coming to an end. I told him that I thought the best thing to do is the county social services i.e. [facility] social services I am not sure how else to help him with this. We of course will not discharge him which I think was his concern. He does have an appointment with Dr. Myra Gianotti on 11/7 with regards to the reflux studies. 11/8; the patient saw Dr. Myra Gianotti who noted mild at the saphenofemoral junction on the right but he did not feel that the vein was pathologic and he did not feel he would benefit from laser ablation. Suggested continuing to focus on wound care. We are using silver alginate with Bactroban 11/17; wound looks about the same. Still a fair amount of drainage here. Although the wound is coming in surface area it still a deep wound full-thickness. I am using silver alginate with Bactroban He really applied for Medicaid. Wondering about a skin graft. I am uncertain about that right  now because of the drainage 12/1; wound is measuring slightly smaller in width. Surface of this looks better. Changed him to [facility] still using topical Bactroban 12/8; no major change in dimensions although the surface looks excellent we have been using Bactroban and covering Hydrofera Blue. Considering application for TheraSkin if it is available through his version of Medicaid 12/15; nice healthy appearing wound advancing epithelialization 12/22; improvement in surface area using Bactroban under Hydrofera Blue. Originally a difficult large wound likely  secondary to chronic venous insufficiency 08/06/2021; no major change in surface area. We are using Bactroban under Hydrofera Blue 08/19/2021; we are using Sorbact with covering calcium alginate and attempt to get a better looking wound surface with less debris.Still under compression He is denied for TheraSkin by his version of Medicaid. This is in it self not that surprising 1/26; using Sorbact with covering silver alginate. Surfaces look better except for the lateral part of the left ankle wound. With the efforts of our staff we have him approved for TheraSkin through [facility] [previously we did not run the correct Medicaid version] 2/2; using Sorbact. Unfortunately the patient comes in with a large area of necrotic debris very malodorous. No clear surrounding infection. He is approved for TheraSkin but the wound bed just is not ready for that at this point. 2/9; because of the odor and debris last time we did not go ahead with Elgie Collard has a $4 affordable co-pay per application]. PCR culture I did last week showed high titers of E. coli moderate titers of Klebsiella and low titers of Pseudomonas Peptostreptococcus which is anaerobic. Does not have evidence of surrounding infection I have therefore elected to treat this with topical gentamicin under the silver alginate. Also with aggressive debridement 2/16; I'm using topical gentamicin  to cover the culture gram negatives under silver alginate. Where making nice progress on this wound. I'm still have the thorough skin in reserve but I'm not ready to apply that next week perhaps ordero He still requiring debridement but overall the wound surfaces look a lot better Objective Constitutional Vitals Time Taken: 4:43 PM, Temperature: 97.5 F, Pulse: 67 bpm, Respiratory Rate: 18 breaths/min, Blood Pressure: 117/73 mmHg. Integumentary (Hair, Skin) Wound #1 status is Open. Original cause of wound was Trauma. The date acquired was: 10/14/2020. The wound has been in treatment 32 weeks. The wound is located on the Left Ankle. The wound measures 2.3cm length x 6.7cm width x 0.2cm depth; 12.103cm^2 area and 2.421cm^3 volume. There is Fat Layer (Subcutaneous Tissue) exposed. There is no tunneling or undermining noted. There is a medium amount of serosanguineous drainage noted. The wound margin is flat and intact. There is medium (34-66%) red granulation within the wound bed. There is a medium (34-66%) amount of necrotic tissue within the wound bed including Adherent Slough. 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 There was a Excisional Skin/Subcutaneous Tissue Debridement with a total area of 9.2 sq cm performed by Maxwell Caul., MD. With the following instrument(s): Curette to remove Viable and Non-Viable tissue/material. Material removed includes Subcutaneous Tissue and Slough and after achieving pain control using Other (benzocaine 20% spray). No specimens were taken. A time out was conducted at 17:10, prior to the start of the procedure. A Minimum amount of bleeding was controlled with Pressure. The procedure was tolerated well  with a pain level of 8 throughout and a pain level of 4 following the procedure. Post Debridement Measurements: 2.3cm length x 6.7cm width x 0.2cm depth; 2.421cm^3 volume. Character of Wound/Ulcer Post Debridement requires further debridement. Post procedure Diagnosis Wound #1: Same as Pre-Procedure Pre-procedure diagnosis of Wound #1 is a Trauma, Other located on the Left Ankle . There was a Three Layer Compression Therapy Procedure by Zenaida Deed, RN. Post procedure Diagnosis Wound #  1: Same as Pre-Procedure Plan Follow-up Appointments: Return Appointment in 1 week. - Thursday with Dr. Leanord Hawkingobson - Interpreter Required Nurse Visit: - Monday 2/20 for rewrap Cellular or Tissue Based Products: Cellular or Tissue Based Product Type: - Theraskin is COVERED. $4 CO-PAY PER APPLICATION Bathing/ Shower/ Hygiene: May shower with protection but do not get wound dressing(s) wet. Edema Control - Lymphedema / SCD / Other: Elevate legs to the level of the heart or above for 30 minutes daily and/or when sitting, a frequency of: - 3-4 times a day throughout the day. Avoid standing for long periods of time. Exercise regularly Off-Loading: Open toe surgical shoe to: - left foot WOUND #1: - Ankle Wound Laterality: Left Cleanser: Soap and Water 2 x Per Week/ Discharge Instructions: May shower and wash wound with dial antibacterial soap and water prior to dressing change. Cleanser: Wound Cleanser 2 x Per Week/ Discharge Instructions: Cleanse the wound with wound cleanser prior to applying a clean dressing using gauze sponges, not tissue or cotton balls. Peri-Wound Care: Triamcinolone 15 (g) 2 x Per Week/ Discharge Instructions: T wound and leg o Peri-Wound Care: Zinc Oxide Ointment 30g tube 2 x Per Week/ Discharge Instructions: Apply Zinc Oxide to periwound with each dressing change Peri-Wound Care: Sween Lotion (Moisturizing lotion) 2 x Per Week/ Discharge Instructions: Apply moisturizing lotion as  directed Topical: Gentamicin 2 x Per Week/ Discharge Instructions: Apply directly to wound bed, under silver alginate Prim Dressing: KerraCel Ag Gelling Fiber Dressing, 4x5 in (silver alginate) 2 x Per Week/ ary Discharge Instructions: Apply silver alginate to wound bed as instructed Secondary Dressing: ABD Pad, 8x10 2 x Per Week/ Discharge Instructions: Apply over primary dressing as directed. Secondary Dressing: Zetuvit Plus 4x8 in 2 x Per Week/ Discharge Instructions: Apply over primary dressing as directed. Secondary Dressing: CarboFLEX Odor Control Dressing, 4x4 in 2 x Per Week/ Discharge Instructions: Apply over primary dressing as directed. Com pression Wrap: ThreePress (3 layer compression wrap) 2 x Per Week/ Discharge Instructions: Apply three layer compression as directed. #1 continuing to use topical gentamicinunder silver alginate #2 at this point the services improving but I still think further mechanical debridement may be necessary. #3 once we have assured a viable surface there is skin could be an option which we already have an affordable co-pay for Electronic Signature(s) Signed: 09/17/2021 5:55:17 PM By: Baltazar Najjarobson, Bayler Nehring MD Entered By: Baltazar Najjarobson, Jaysten Essner on 09/17/2021 17:31:32 -------------------------------------------------------------------------------- SuperBill Details Patient Name: Date of Service: [name] [name] MarseillesYISHIMYE, [name] 09/17/2021 Medical Record Number: [id number] Patient Account Number: [id number] Date of Birth/Sex: Treating RN: 09/17/1975 (46 y.o. [name]) Boehlein, Linda Primary Care Provider: Shelby Mattocksahbura, [name] Other Clinician: Referring Provider: Treating Provider/Extender: Darleen Crockerobson, Mohammed Mcandrew Dahbura, [name] Weeks in Treatment: 32 Diagnosis Coding ICD-10 Codes Code Description [phone] Non-pressure chronic ulcer of left ankle with other specified severity I87.332 Chronic venous hypertension (idiopathic) with ulcer and inflammation of left lower extremity Facility  Procedures CPT4 Code: [id number] Description: 11042 - DEB SUBQ TISSUE 20 SQ CM/< ICD-10 Diagnosis Description L97.328 Non-pressure chronic ulcer of left ankle with other specified severity Modifier: Quantity: 1 Physician Procedures : CPT4 Code Description Modifier [id number] 11042 - WC PHYS SUBQ TISS 20 SQ CM ICD-10 Diagnosis Description L97.328 Non-pressure chronic ulcer of left ankle with other specified severity Quantity: 1 Electronic Signature(s) Signed: 09/17/2021 5:55:17 PM By: Baltazar Najjarobson, Karleigh Bunte MD Previous Signature: 09/17/2021 5:28:56 PM Version By: Zenaida DeedBoehlein, Linda RN, BSN Entered By: Baltazar Najjarobson, Falan Hensler on 09/17/2021 17:31:54

## 2021-09-17 NOTE — Progress Notes (Signed)
DEVRY, SEGAR ([id number]) Visit Report for 09/17/2021 Arrival Information Details Patient Name: Date of Service: [name] 09/17/2021 4:00 PM Medical Record Number: [id number] Patient Account Number: [id number] Date of Birth/Sex: Treating RN: [birth year] (46 y.o. [name], [name] Primary Care Jennier Schissler: [name] Other Clinician: Referring Eljay Lave: Treating Daphane Odekirk/Extender: Darleen Crocker in Treatment: 32 Visit Information History Since Last Visit Added or deleted any medications: No Patient Arrived: Ambulatory Any new allergies or adverse reactions: No Arrival Time: 16:34 Had a fall or experienced change in No Accompanied By: interpreter activities of daily living that may affect Transfer Assistance: None risk of falls: Patient Identification Verified: Yes Signs or symptoms of abuse/neglect since last visito No Secondary Verification Process Completed: Yes Hospitalized since last visit: No Patient Requires Transmission-Based Precautions: No Implantable device outside of the clinic excluding No Patient Has Alerts: No cellular tissue based products placed in the center since last visit: Has Dressing in Place as Prescribed: Yes Has Compression in Place as Prescribed: Yes Pain Present Now: No Electronic Signature(s) Signed: 09/17/2021 5:28:56 PM By: [name], BSN Entered By: Zenaida Deed on 09/17/2021 16:43:31 -------------------------------------------------------------------------------- Compression Therapy Details Patient Name: Date of Service: [name] 09/17/2021 4:00 PM Medical Record Number: [id number] Patient Account Number: [id number] Date of Birth/Sex: Treating RN: 03-06-1976 (46 y.o. [name] Primary Care Aby Gessel: [name] Other Clinician: Referring Ronisha Herringshaw: Treating Kaydan Wong/Extender: Darleen Crocker in Treatment: 32 Compression Therapy Performed for Wound  Assessment: Wound #1 Left Ankle Performed By: Clinician Zenaida Deed, RN Compression Type: Three Layer Post Procedure Diagnosis Same as Pre-procedure Electronic Signature(s) Signed: 09/17/2021 5:28:56 PM By: [name], BSN Entered By: Zenaida Deed on 09/17/2021 17:04:38 -------------------------------------------------------------------------------- Encounter Discharge Information Details Patient Name: Date of Service: [name] 09/17/2021 4:00 PM Medical Record Number: [id number] Patient Account Number: [id number] Date of Birth/Sex: Treating RN: 09/22/1975 (46 y.o. [name] Primary Care Akon Reinoso: [name] Other Clinician: Referring Naliyah Neth: Treating Alahna Dunne/Extender: Darleen Crocker in Treatment: 32 Encounter Discharge Information Items Post Procedure Vitals Discharge Condition: Stable Temperature (F): 97.5 Ambulatory Status: Ambulatory Pulse (bpm): 67 Discharge Destination: Home Respiratory Rate (breaths/min): 18 Transportation: Private Auto Blood Pressure (mmHg): 117/73 Accompanied By: interpreter Schedule Follow-up Appointment: Yes Clinical Summary of Care: Patient Declined Electronic Signature(s) Signed: 09/17/2021 5:28:56 PM By: [name], BSN Entered By: Zenaida Deed on 09/17/2021 17:28:30 -------------------------------------------------------------------------------- Lower Extremity Assessment Details Patient Name: Date of Service: [name] 09/17/2021 4:00 PM Medical Record Number: [id number] Patient Account Number: [id number] Date of Birth/Sex: Treating RN: 1975/08/07 (46 y.o. [name] Primary Care Brienna Bass: [name] Other Clinician: Referring Jeimy Bickert: Treating Hallee Mckenny/Extender: Darleen Crocker in Treatment: 32 Edema Assessment Assessed: [name]: No] [name]: No] Edema: [Left: N] [Right: o] Calf Left: Right: Point of Measurement: 28  cm From Medial Instep 31 cm Ankle Left: Right: Point of Measurement: 8 cm From Medial Instep 22.5 cm Vascular Assessment Pulses: Dorsalis Pedis Palpable: [Left:Yes] Electronic Signature(s) Signed: 09/17/2021 5:28:56 PM By: [name], BSN Entered By: Zenaida Deed on 09/17/2021 16:53:38 -------------------------------------------------------------------------------- Multi Wound Chart Details Patient Name: Date of Service: [name] 09/17/2021 4:00 PM Medical Record Number: [id number] Patient Account Number: [id number] Date of Birth/Sex: Treating RN: [birth year] (46 y.o. [name] Primary Care Braedyn Kauk: [name] Other Clinician: Referring Cobey Raineri: Treating Oda Lansdowne/Extender: Darleen Crocker in Treatment: 32 Vital Signs Height(in): Pulse(bpm): 67 Weight(lbs): Blood  Pressure(mmHg): 117/73 Body Mass Index(BMI): Temperature(F): 97.5 Respiratory Rate(breaths/min): 18 Photos: [N/A:N/A] Left Ankle N/A N/A Wound Location: Trauma N/A N/A Wounding Event: Trauma, Other N/A N/A Primary Etiology: 10/14/2020 N/A N/A Date Acquired: 37 N/A N/A Weeks of Treatment: Open N/A N/A Wound Status: No N/A N/A Wound Recurrence: 2.3x6.7x0.2 N/A N/A Measurements L x W x D (cm) 12.103 N/A N/A A (cm) : rea 2.421 N/A N/A Volume (cm) : 80.30% N/A N/A % Reduction in A rea: 90.20% N/A N/A % Reduction in Volume: Full Thickness Without Exposed N/A N/A Classification: Support Structures Medium N/A N/A Exudate A mount: Serosanguineous N/A N/A Exudate Type: red, brown N/A N/A Exudate Color: Flat and Intact N/A N/A Wound Margin: Medium (34-66%) N/A N/A Granulation A mount: Red N/A N/A Granulation Quality: Medium (34-66%) N/A N/A Necrotic A mount: Fat Layer (Subcutaneous Tissue): Yes N/A N/A Exposed Structures: Fascia: No Tendon: No Muscle: No Joint: No Bone: No Small (1-33%) N/A N/A Epithelialization: Debridement -  Excisional N/A N/A Debridement: Pre-procedure Verification/Time Out 17:10 N/A N/A Taken: Other N/A N/A Pain Control: Subcutaneous, Slough N/A N/A Tissue Debrided: Skin/Subcutaneous Tissue N/A N/A Level: 9.2 N/A N/A Debridement A (sq cm): rea Curette N/A N/A Instrument: Minimum N/A N/A Bleeding: Pressure N/A N/A Hemostasis A chieved: 8 N/A N/A Procedural Pain: 4 N/A N/A Post Procedural Pain: Procedure was tolerated well N/A N/A Debridement Treatment Response: 2.3x6.7x0.2 N/A N/A Post Debridement Measurements L x W x D (cm) 2.421 N/A N/A Post Debridement Volume: (cm) Compression Therapy N/A N/A Procedures Performed: Debridement Treatment Notes Wound #1 (Ankle) Wound Laterality: Left Cleanser Soap and Water Discharge Instruction: May shower and wash wound with dial antibacterial soap and water prior to dressing change. Wound Cleanser Discharge Instruction: Cleanse the wound with wound cleanser prior to applying a clean dressing using gauze sponges, not tissue or cotton balls. Peri-Wound Care Triamcinolone 15 (g) Discharge Instruction: T wound and leg o Zinc Oxide Ointment 30g tube Discharge Instruction: Apply Zinc Oxide to periwound with each dressing change Sween Lotion (Moisturizing lotion) Discharge Instruction: Apply moisturizing lotion as directed Topical Gentamicin Discharge Instruction: Apply directly to wound bed, under silver alginate Primary Dressing KerraCel Ag Gelling Fiber Dressing, 4x5 in (silver alginate) Discharge Instruction: Apply silver alginate to wound bed as instructed Secondary Dressing ABD Pad, 8x10 Discharge Instruction: Apply over primary dressing as directed. Zetuvit Plus 4x8 in Discharge Instruction: Apply over primary dressing as directed. CarboFLEX Odor Control Dressing, 4x4 in Discharge Instruction: Apply over primary dressing as directed. Secured With Compression Wrap ThreePress (3 layer compression wrap) Discharge  Instruction: Apply three layer compression as directed. Compression Stockings Add-Ons Electronic Signature(s) Signed: 09/17/2021 5:55:17 PM By: Baltazar Najjar MD Signed: 09/17/2021 6:28:13 PM By: Zandra Abts RN, BSN Entered By: Baltazar Najjar on 09/17/2021 17:27:24 -------------------------------------------------------------------------------- Multi-Disciplinary Care Plan Details Patient Name: Date of Service: [name] 09/17/2021 4:00 PM Medical Record Number: [id number] Patient Account Number: [id number] Date of Birth/Sex: Treating RN: February 21, 1976 (46 y.o. [name] Primary Care Melyssa Signor: [name] Other Clinician: Referring Dewayne Severe: Treating Chirsty Armistead/Extender: Darleen Crocker in Treatment: 32 Multidisciplinary Care Plan reviewed with physician Active Inactive Wound/Skin Impairment Nursing Diagnoses: Knowledge deficit related to ulceration/compromised skin integrity Goals: Patient/caregiver will verbalize understanding of skin care regimen Date Initiated: 02/05/2021 Target Resolution Date: 10/03/2021 Goal Status: Active Interventions: Assess patient/caregiver ability to obtain necessary supplies Assess patient/caregiver ability to perform ulcer/skin care regimen upon admission and as needed Provide education on ulcer and skin care Treatment Activities: Skin care regimen  initiated : 02/05/2021 Topical wound management initiated : 02/05/2021 Notes: 03/31/21: Wound care regimen ongoing, target date extended. 04/21/21: Wound care ongoing, through interpreter patient states he is doing fine with his dressing changes. Electronic Signature(s) Signed: 09/17/2021 5:28:56 PM By: Zenaida DeedBoehlein, Linda RN, BSN Entered By: Zenaida DeedBoehlein, Linda on 09/17/2021 17:04:00 -------------------------------------------------------------------------------- Pain Assessment Details Patient Name: Date of Service: [name] [name] MarseillesYISHIMYE, [name] 09/17/2021 4:00 PM Medical  Record Number: [id number] Patient Account Number: [id number] Date of Birth/Sex: Treating RN: 03/29/1976 (46 y.o. [name]) Boehlein, Linda Primary Care Horace Lukas: Shelby Mattocksahbura, [name] Other Clinician: Referring Zehava Turski: Treating Kieryn Burtis/Extender: Darleen Crockerobson, [name], [name] Weeks in Treatment: 32 Active Problems Location of Pain Severity and Description of Pain Patient Has Paino No Site Locations Rate the pain. Current Pain Level: 0 Pain Management and Medication Current Pain Management: Electronic Signature(s) Signed: 09/17/2021 5:28:56 PM By: Zenaida DeedBoehlein, Linda RN, BSN Entered By: Zenaida DeedBoehlein, Linda on 09/17/2021 16:46:15 -------------------------------------------------------------------------------- Patient/Caregiver Education Details Patient Name: Date of Service: [name] [name] MarseillesYISHIMYE, [name] 2/16/2023andnbsp4:00 PM Medical Record Number: [id number] Patient Account Number: [id number] Date of Birth/Gender: Treating RN: 04/18/1976 (46 y.o. [name]) Boehlein, Linda Primary Care Physician: Shelby Mattocksahbura, [name] Other Clinician: Referring Physician: Treating Physician/Extender: Darleen Crockerobson, [name], [name] Weeks in Treatment: 32 Education Assessment Education Provided To: Patient Education Topics Provided Venous: Methods: Explain/Verbal Responses: Reinforcements needed, State content correctly Electronic Signature(s) Signed: 09/17/2021 5:28:56 PM By: Zenaida DeedBoehlein, Linda RN, BSN Entered By: Zenaida DeedBoehlein, Linda on 09/17/2021 17:04:16 -------------------------------------------------------------------------------- Wound Assessment Details Patient Name: Date of Service: [name] [name] MarseillesYISHIMYE, [name] 09/17/2021 4:00 PM Medical Record Number: [id number] Patient Account Number: [id number] Date of Birth/Sex: Treating RN: 09/23/1975 (46 y.o. [name]) Boehlein, Linda Primary Care Kimora Stankovic: Shelby Mattocksahbura, [name] Other Clinician: Referring Samaa Ueda: Treating Bertie Simien/Extender: Darleen Crockerobson, [name], [name] Weeks in Treatment: 32 Wound  Status Wound Number: 1 Primary Etiology: Trauma, Other Wound Location: Left Ankle Wound Status: Open Wounding Event: Trauma Date Acquired: 10/14/2020 Weeks Of Treatment: 32 Clustered Wound: No Photos Wound Measurements Length: (cm) 2.3 Width: (cm) 6.7 Depth: (cm) 0.2 Area: (cm) 12.103 Volume: (cm) 2.421 % Reduction in Area: 80.3% % Reduction in Volume: 90.2% Epithelialization: Small (1-33%) Tunneling: No Undermining: No Wound Description Classification: Full Thickness Without Exposed Support Structures Wound Margin: Flat and Intact Exudate Amount: Medium Exudate Type: Serosanguineous Exudate Color: red, brown Foul Odor After Cleansing: No Slough/Fibrino Yes Wound Bed Granulation Amount: Medium (34-66%) Exposed Structure Granulation Quality: Red Fascia Exposed: No Necrotic Amount: Medium (34-66%) Fat Layer (Subcutaneous Tissue) Exposed: Yes Necrotic Quality: Adherent Slough Tendon Exposed: No Muscle Exposed: No Joint Exposed: No Bone Exposed: No Treatment Notes Wound #1 (Ankle) Wound Laterality: Left Cleanser Soap and Water Discharge Instruction: May shower and wash wound with dial antibacterial soap and water prior to dressing change. Wound Cleanser Discharge Instruction: Cleanse the wound with wound cleanser prior to applying a clean dressing using gauze sponges, not tissue or cotton balls. Peri-Wound Care Triamcinolone 15 (g) Discharge Instruction: T wound and leg o Zinc Oxide Ointment 30g tube Discharge Instruction: Apply Zinc Oxide to periwound with each dressing change Sween Lotion (Moisturizing lotion) Discharge Instruction: Apply moisturizing lotion as directed Topical Gentamicin Discharge Instruction: Apply directly to wound bed, under silver alginate Primary Dressing KerraCel Ag Gelling Fiber Dressing, 4x5 in (silver alginate) Discharge Instruction: Apply silver alginate to wound bed as instructed Secondary Dressing ABD Pad, 8x10 Discharge  Instruction: Apply over primary dressing as directed. Zetuvit Plus 4x8 in Discharge Instruction: Apply over primary dressing as directed. CarboFLEX Odor Control Dressing, 4x4 in Discharge Instruction: Apply over primary dressing  as directed. Secured With Compression Wrap ThreePress (3 layer compression wrap) Discharge Instruction: Apply three layer compression as directed. Compression Stockings Add-Ons Electronic Signature(s) Signed: 09/17/2021 5:28:56 PM By: [name], BSN Entered By: Zenaida Deed on 09/17/2021 17:00:03 -------------------------------------------------------------------------------- Vitals Details Patient Name: Date of Service: [name], [name] 09/17/2021 4:00 PM Medical Record Number: [id number] Patient Account Number: [id number] Date of Birth/Sex: Treating RN: 31-Dec-1975 (46 y.o. [name] Primary Care Dailen Mcclish: [name] Other Clinician: Referring Rasool Rommel: Treating Daishon Chui/Extender: Darleen Crocker in Treatment: 32 Vital Signs Time Taken: 16:43 Temperature (F): 97.5 Pulse (bpm): 67 Respiratory Rate (breaths/min): 18 Blood Pressure (mmHg): 117/73 Reference Range: 80 - 120 mg / dl Electronic Signature(s) Signed: 09/17/2021 5:28:56 PM By: [name], BSN Entered By: Zenaida Deed on 09/17/2021 16:45:34

## 2021-09-21 ENCOUNTER — Encounter (HOSPITAL_BASED_OUTPATIENT_CLINIC_OR_DEPARTMENT_OTHER): Payer: Medicaid Other | Admitting: Internal Medicine

## 2021-09-21 ENCOUNTER — Other Ambulatory Visit: Payer: Self-pay

## 2021-09-21 DIAGNOSIS — L97328 Non-pressure chronic ulcer of left ankle with other specified severity: Secondary | ICD-10-CM | POA: Diagnosis not present

## 2021-09-21 DIAGNOSIS — I87332 Chronic venous hypertension (idiopathic) with ulcer and inflammation of left lower extremity: Secondary | ICD-10-CM | POA: Diagnosis not present

## 2021-09-21 DIAGNOSIS — F17200 Nicotine dependence, unspecified, uncomplicated: Secondary | ICD-10-CM | POA: Diagnosis not present

## 2021-09-21 NOTE — Progress Notes (Signed)
[name], [name] ([id number]) Visit Report for 09/21/2021 Arrival Information Details Patient Name: Date of Service: [name] [place] 09/21/2021 2:30 PM Medical Record Number: [id number] Patient Account Number: [id number] Date of Birth/Sex: Treating RN: Dec 09, 1975 (46 y.o. [name], [name] Primary Care Demica Zook: Wells Guiles Other Clinician: Referring Dustina Scoggin: Treating Cyleigh Massaro/Extender: Donetta Potts in Treatment: 15 Visit Information History Since Last Visit Added or deleted any medications: No Patient Arrived: Ambulatory Any new allergies or adverse reactions: No Arrival Time: 14:58 Had a fall or experienced change in No Accompanied By: interpreter activities of daily living that may affect Transfer Assistance: None risk of falls: Patient Identification Verified: Yes Signs or symptoms of abuse/neglect since last visito No Secondary Verification Process Completed: Yes Hospitalized since last visit: No Patient Requires Transmission-Based Precautions: No Implantable device outside of the clinic excluding No Patient Has Alerts: No cellular tissue based products placed in the center since last visit: Has Dressing in Place as Prescribed: Yes Has Compression in Place as Prescribed: Yes Pain Present Now: No Electronic Signature(s) Signed: 09/21/2021 5:43:45 PM By: Baruch Gouty RN, BSN Entered By: Baruch Gouty on 09/21/2021 15:02:35 -------------------------------------------------------------------------------- Compression Therapy Details Patient Name: Date of Service: [name] NA [place] 09/21/2021 2:30 PM Medical Record Number: [id number] Patient Account Number: [id number] Date of Birth/Sex: Treating RN: Oct 17, 1975 (46 y.o. [name] Primary Care Flor Houdeshell: Wells Guiles Other Clinician: Referring Shikita Vaillancourt: Treating Aaniya Sterba/Extender: Donetta Potts in Treatment: 32 Compression Therapy Performed for Wound  Assessment: Wound #1 Left Ankle Performed By: Clinician Baruch Gouty, RN Compression Type: Three Hydrologist) Signed: 09/21/2021 5:43:45 PM By: Baruch Gouty RN, BSN Entered By: Baruch Gouty on 09/21/2021 15:21:58 -------------------------------------------------------------------------------- Encounter Discharge Information Details Patient Name: Date of Service: [name] NA [place] 09/21/2021 2:30 PM Medical Record Number: [id number] Patient Account Number: [id number] Date of Birth/Sex: Treating RN: [birth year] (46 y.o. [name] Primary Care [name]: Wells Guiles Other Clinician: Referring Duane Earnshaw: Treating [name]/Extender: Donetta Potts in Treatment: 83 Encounter Discharge Information Items Discharge Condition: Stable Ambulatory Status: Ambulatory Discharge Destination: Home Transportation: Private Auto Accompanied By: self Schedule Follow-up Appointment: Yes Clinical Summary of Care: Patient Declined Electronic Signature(s) Signed: 09/21/2021 5:43:45 PM By: Baruch Gouty RN, BSN Entered By: Baruch Gouty on 09/21/2021 15:23:36 -------------------------------------------------------------------------------- Patient/Caregiver Education Details Patient Name: Date of Service: [name] 2/20/2023andnbsp2:30 PM Medical Record Number: [id number] Patient Account Number: [id number] Date of Birth/Gender: Treating RN: [birth year] (46 y.o. [name] Primary Care Physician: Wells Guiles Other Clinician: Referring Physician: Treating Physician/Extender: Donetta Potts in Treatment: 9 Education Assessment Education Provided To: Patient Education Topics Provided Venous: Methods: Explain/Verbal Responses: Reinforcements needed, State content correctly Wound/Skin Impairment: Electronic Signature(s) Signed: 09/21/2021 5:43:45 PM By: Baruch Gouty RN, BSN Entered By:  Baruch Gouty on 09/21/2021 15:23:15 -------------------------------------------------------------------------------- Wound Assessment Details Patient Name: Date of Service: [name] NA [place] 09/21/2021 2:30 PM Medical Record Number: [id number] Patient Account Number: [id number] Date of Birth/Sex: Treating RN: 1975/08/25 (46 y.o. [name] Primary Care [name]: Wells Guiles Other Clinician: Referring [name]: Treating [name]/Extender: Donetta Potts in Treatment: 32 Wound Status Wound Number: 1 Primary Etiology: Trauma, Other Wound Location: Left Ankle Wound Status: Open Wounding Event: Trauma Date Acquired: 10/14/2020 Weeks Of Treatment: 32 Clustered Wound: No Wound Measurements Length: (cm) 2.3 Width: (cm) 6.7 Depth: (cm) 0.2 Area: (cm) 12.103 Volume: (cm) 2.421 % Reduction in Area: 80.3% % Reduction in Volume: 90.2% Epithelialization: Small (  1-33%) Tunneling: No Undermining: No Wound Description Classification: Full Thickness Without Exposed Support Structures Wound Margin: Flat and Intact Exudate Amount: Medium Exudate Type: Serosanguineous Exudate Color: red, brown Foul Odor After Cleansing: No Slough/Fibrino Yes Wound Bed Granulation Amount: Large (67-100%) Exposed Structure Granulation Quality: Red Fascia Exposed: No Necrotic Amount: Small (1-33%) Fat Layer (Subcutaneous Tissue) Exposed: Yes Necrotic Quality: Adherent Slough Tendon Exposed: No Muscle Exposed: No Joint Exposed: No Bone Exposed: No Treatment Notes Wound #1 (Ankle) Wound Laterality: Left Cleanser Soap and Water Discharge Instruction: May shower and wash wound with dial antibacterial soap and water prior to dressing change. Wound Cleanser Discharge Instruction: Cleanse the wound with wound cleanser prior to applying a clean dressing using gauze sponges, not tissue or cotton balls. Peri-Wound Care Triamcinolone 15 (g) Discharge Instruction: T  wound and leg o Zinc Oxide Ointment 30g tube Discharge Instruction: Apply Zinc Oxide to periwound with each dressing change Sween Lotion (Moisturizing lotion) Discharge Instruction: Apply moisturizing lotion as directed Topical Gentamicin Discharge Instruction: Apply directly to wound bed, under silver alginate Primary Dressing KerraCel Ag Gelling Fiber Dressing, 4x5 in (silver alginate) Discharge Instruction: Apply silver alginate to wound bed as instructed Secondary Dressing CarboFLEX Odor Control Dressing, 4x4 in Discharge Instruction: Apply over primary dressing as directed. Secured With Compression Wrap ThreePress (3 layer compression wrap) Discharge Instruction: Apply three layer compression as directed. Compression Stockings Add-Ons Electronic Signature(s) Signed: 09/21/2021 5:43:45 PM By: Baruch Gouty RN, BSN Entered By: Baruch Gouty on 09/21/2021 15:20:04 -------------------------------------------------------------------------------- Vitals Details Patient Name: Date of Service: [name] NA [place] 09/21/2021 2:30 PM Medical Record Number: [id number] Patient Account Number: [id number] Date of Birth/Sex: Treating RN: 1976/01/15 (46 y.o. [name] Primary Care Leonel Mccollum: Wells Guiles Other Clinician: Referring Travus Oren: Treating Berthel Bagnall/Extender: Donetta Potts in Treatment: 32 Vital Signs Time Taken: 15:02 Temperature (F): 98.5 Pulse (bpm): 89 Respiratory Rate (breaths/min): 18 Blood Pressure (mmHg): 103/70 Reference Range: 80 - 120 mg / dl Electronic Signature(s) Signed: 09/21/2021 5:43:45 PM By: Baruch Gouty RN, BSN Entered By: Baruch Gouty on 09/21/2021 15:03:11

## 2021-09-22 NOTE — Progress Notes (Signed)
[name], [name] ([id number]) Visit Report for 09/21/2021 SuperBill Details Patient Name: Date of Service: [name] [name] 09/21/2021 Medical Record Number: [id number] Patient Account Number: [id number] Date of Birth/Sex: Treating RN: 1976/03/02 (46 y.o. 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

## 2021-09-24 ENCOUNTER — Encounter (HOSPITAL_BASED_OUTPATIENT_CLINIC_OR_DEPARTMENT_OTHER): Payer: Medicaid Other | Admitting: Internal Medicine

## 2021-09-25 ENCOUNTER — Encounter (HOSPITAL_BASED_OUTPATIENT_CLINIC_OR_DEPARTMENT_OTHER): Payer: Medicaid Other | Admitting: General Surgery

## 2021-09-25 ENCOUNTER — Other Ambulatory Visit: Payer: Self-pay

## 2021-09-25 DIAGNOSIS — L97328 Non-pressure chronic ulcer of left ankle with other specified severity: Secondary | ICD-10-CM | POA: Diagnosis not present

## 2021-09-25 DIAGNOSIS — I87332 Chronic venous hypertension (idiopathic) with ulcer and inflammation of left lower extremity: Secondary | ICD-10-CM | POA: Diagnosis not present

## 2021-09-25 DIAGNOSIS — F17200 Nicotine dependence, unspecified, uncomplicated: Secondary | ICD-10-CM | POA: Diagnosis not present

## 2021-09-25 NOTE — Progress Notes (Signed)
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 culture fungal culture and bacterial culture. A separate sample can be sent to the [facility] [place] for molecular testing for mycobacteriaooMycobacterium ulcerans/Buruli ulcer I do not believe that this is some of the more atypical ulcers we see including pyoderma gangrenosum /pemphigus. It is quite possible that there is vascular issues here and I have tried to get him in for arterial and venous evaluation. Certainly the latter could be playing a primary role. 7/27; patient comes in with a wound absolutely no better. Marked malodor although he missed his appointment earlier this week for a dressing change. We still do not have vascular evaluation I ordered arterial and venous. Again there are issues with communication here. He has completed the antibiotics I initially gave him for strep. I thought he was making some improvements but really no improvement in any aspect of this wound today. 8/5; interpreter present over the phone. Patient reports improvement in wound healing. He is currently taking the antibiotics prescribed by Dr. Luciana Axe (infectious disease). He has no issues or complaints today. He denies signs of infection. 03/10/2021 upon evaluation today patient appears to be doing okay in  regard to his wound. This is measuring a little bit smaller. Does have a lot of slough and biofilm noted on the surface of the wound. I do believe that sharp debridement would be of benefit for him. 8/23; 3 and half weeks since I last saw this man. Quite an improvement. I note the biopsy I did was nonspecific stains for Mycobacterium and fungi were negative. He has been following with Dr. Timmothy Euler who is been helpful prescribing clarithromycin and Bactrim. He has now completed this. He also had arterial and venous studies. His arterial study on the right showed an ABI of 1.10 with a TBI of 1.08 on the left unfortunately they did not remove the bandages but his TBI was 0.73 which is normal. He also had venous reflux studies these showed evidence of venous reflux at the greater saphenous vein at the saphenofemoral junction as well as the greater saphenous vein proximally in the thigh but no reflux in the calf Things are quite a bit better than the last time I saw him although the progress is slow. We have been using silver alginate. 8/30; generally continuing improvement in surface area and condition of the wound surface we have been using Hydrofera Blue under compression. The patient's only complaint through the [place] interpreter is that  he has some degree of itching 9/6; continued improvement in overall surface area down 1 cm in width we have been using Hydrofera Blue. We have interviewed him through a [place] interpreter today. He reports no additional issues 9/13 not much change in surface area today. We have been using Hydrofera Blue. He was interviewed through the [place] interpreter today. Still have him under compression. We used Molecu[employer] imaging 9/20; the wound is actually larger in its width. Also noted an odor and drainage. I used Iodoflex last time to help with the debris on the surface. He is not on any antibiotics. We did this interview through the [place] interpreter 9/27;  better and with today. Odor and drainage seems better. We use silver alginate last time and that seems to have helped. We used his neighbor his [place] interpreter 10/4; improved length and improved condition of the wound bed. We have been using silver alginate. We interviewed him through his [place] interpreter. I am going to have vein and vascular look at this including his reflux studies. He came into the clinic with a very angry inflamed wound that admitted there for many months. This now looks a lot better. He did not have anything in the calf on the left that had significant reflux although he did have it in his thigh. I want to make sure that everything can be done for this man to prevent this from reoccurring He has Medicaid and we might be able to order him a TheraSkin for an advanced treatment option. We will look into this. 10/14; patient comes in after a 10-day hiatus. Drainage weeping through his wrap. Marked malodor although the surface of the wound does not look so bad and dimensions are about the same. Through the interpreter on the phone he is not complaining of pain 10/20; wound surface covered in fibrinous debris. This is largely on the lateral part of his foot. We interviewed him through a interpreter on the phone A little more drainage reported by our nurses. We have been using silver alginate under compression with sit to fit and CarboFlex He has been to see infectious disease Dr. Luciana Axe. Noted that he has been on Bactrim and clarithromycin for possible mycobacterial or other indolent infection. I am not sure if he is still taking antibiotics but these are listed as being discontinued and by infectious disease 10/27; our intake nurse reported large amount of drainage today more than usual. We have been using silver alginate. He still has not seen vein and vascular about the reflux studies I am not sure what the issue is here. He is very itchy under the wound on the left lateral  foot The patient comes into clinic concerned that the 1 year of Medicaid that apparently was assigned to him when he entered the [place]. This is now coming to an end. I told him that I thought the best thing to do is the county social services i.e. [facility] social services I am not sure how else to help him with this. We of course will not discharge him which I think was his concern. He does have an appointment with Dr. Myra Gianotti on 11/7 with regards to the reflux studies. 11/8; the patient saw Dr. Myra Gianotti who noted mild at the saphenofemoral junction on the right but he did not feel that the vein was pathologic and he did not feel he would benefit from laser ablation. Suggested continuing to focus on wound care. We are using silver alginate with Bactroban  11/17; wound looks about the same. Still a fair amount of drainage here. Although the wound is coming in surface area it still a deep wound full-thickness. I am using silver alginate with Bactroban He really applied for Medicaid. Wondering about a skin graft. I am uncertain about that right now because of the drainage 12/1; wound is measuring slightly smaller in width. Surface of this looks better. Changed him to [facility] still using topical Bactroban 12/8; no major change in dimensions although the surface looks excellent we have been using Bactroban and covering Hydrofera Blue. Considering application for TheraSkin if it is available through his version of Medicaid 12/15; nice healthy appearing wound advancing epithelialization 12/22; improvement in surface area using Bactroban under Hydrofera Blue. Originally a difficult large wound likely secondary to chronic venous insufficiency 08/06/2021; no major change in surface area. We are using Bactroban under Hydrofera Blue 08/19/2021; we are using Sorbact with covering calcium alginate and attempt to get a better looking wound surface with less debris.Still under compression He is  denied for TheraSkin by his version of Medicaid. This is in it self not that surprising 1/26; using Sorbact with covering silver alginate. Surfaces look better except for the lateral part of the left ankle wound. With the efforts of our staff we have him approved for TheraSkin through [facility] [previously we did not run the correct Medicaid version] 2/2; using Sorbact. Unfortunately the patient comes in with a large area of necrotic debris very malodorous. No clear surrounding infection. He is approved for TheraSkin but the wound bed just is not ready for that at this point. 2/9; because of the odor and debris last time we did not go ahead with Elgie Collard has a $4 affordable co-pay per application]. PCR culture I did last week showed high titers of E. coli moderate titers of Klebsiella and low titers of Pseudomonas Peptostreptococcus which is anaerobic. Does not have evidence of surrounding infection I have therefore elected to treat this with topical gentamicin under the silver alginate. Also with aggressive debridement 2/16; I'm using topical gentamicin to cover the culture gram negatives under silver alginate. Where making nice progress on this wound. I'm still have the thorough skin in reserve but I'm not ready to apply that next week perhaps ordero He still requiring debridement but overall the wound surfaces look a lot better 09/25/2021: I reviewed old images and I am truly impressed with the significant improvement over time. He is still getting topical gentamicin under silver alginate with 3 layer compression. There has been substantial epithelialization. Drainage has improved and is significantly less. There is still some slough at the base, granulation tissue is forming. I think he is likely to be ready for TheraSkin application next week. Patient History Information obtained from Patient. Family History Unknown History. Social History Current every day smoker, Marital Status - Married,  Alcohol Use - Rarely, Drug Use - No History, Caffeine Use - Moderate. Medical A Surgical History Notes nd Gastrointestinal Chronic Gastritis Objective Constitutional No acute distress. Vitals Time Taken: 12:48 PM, Temperature: 97.9 F, Pulse: 85 bpm, Respiratory Rate: 18 breaths/min, Blood Pressure: 113/65 mmHg. Respiratory Normal work of breathing on room air. General Notes: 09/25/2021: Wound examoothere has been marked improvement in the wound surface. Minimal slough. This was easily debrided with a #5 curette. There is advancing epithelialization. Edema control is excellent. Drainage markedly decreased. No malodor. Integumentary (Hair, Skin) Wound #1 status is Open. Original cause of wound was Trauma. The date acquired was: 10/14/2020. The  wound has been in treatment 33 weeks. The wound is located on the Left Ankle. The wound measures 2.2cm length x 6.5cm width x 0.2cm depth; 11.231cm^2 area and 2.246cm^3 volume. There is Fat Layer (Subcutaneous Tissue) exposed. There is no tunneling or undermining noted. There is a medium amount of serosanguineous drainage noted. The wound margin is flat and intact. There is large (67-100%) red granulation within the wound bed. There is a small (1-33%) amount of necrotic tissue within the wound bed including Adherent Slough. 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 There was a Excisional Skin/Subcutaneous Tissue Debridement with a total area of 12.1 sq cm performed by Duanne Guess, MD. With the following instrument(s): Curette to remove Viable and Non-Viable tissue/material. Material removed includes Subcutaneous Tissue and Slough and after achieving pain control using Other (benzocaine 20%  spray). No specimens were taken. A time out was conducted at 13:10, prior to the start of the procedure. A Minimum amount of bleeding was controlled with Pressure. The procedure was tolerated well with a pain level of 6 throughout and a pain level of 4 following the procedure. Post Debridement Measurements: 2.2cm length x 6.5cm width x 0.2cm depth; 2.246cm^3 volume. Character of Wound/Ulcer Post Debridement is improved. Post procedure Diagnosis Wound #1: Same as Pre-Procedure Pre-procedure diagnosis of Wound #1 is a Trauma, Other located on the Left Ankle . There was a Three Layer Compression Therapy Procedure by Zenaida Deed, RN. Post procedure Diagnosis Wound #1: Same as Pre-Procedure Plan Follow-up Appointments: Return Appointment in 1 week. - Dr. Lady [name] - Interpreter Required Cellular or Tissue Based Products: Cellular or Tissue Based Product Type: - Theraskin is COVERED. $4 CO-PAY PER APPLICATION Bathing/ Shower/ Hygiene: May shower with protection but do not get wound dressing(s) wet. Edema Control - Lymphedema / SCD / Other: Elevate legs to the level of the heart or above for 30 minutes daily and/or when sitting, a frequency of: - 3-4 times a day throughout the day. Avoid standing for long periods of time. Exercise regularly Off-Loading: Open toe surgical shoe to: - left foot WOUND #1: - Ankle Wound Laterality: Left Cleanser: Soap and Water 2 x Per Week/ Discharge Instructions: May shower and wash wound with dial antibacterial soap and water prior to dressing change. Cleanser: Wound Cleanser 2 x Per Week/ Discharge Instructions: Cleanse the wound with wound cleanser prior to applying a clean dressing using gauze sponges, not tissue or cotton balls. Peri-Wound Care: Triamcinolone 15 (g) 2 x Per Week/ Discharge Instructions: T wound and leg o Peri-Wound Care: Zinc Oxide Ointment 30g tube 2 x Per Week/ Discharge Instructions: Apply Zinc Oxide to periwound with each dressing  change Peri-Wound Care: Sween Lotion (Moisturizing lotion) 2 x Per Week/ Discharge Instructions: Apply moisturizing lotion as directed Topical: Gentamicin 2 x Per Week/ Discharge Instructions: Apply directly to wound bed, under silver alginate Prim Dressing: KerraCel Ag Gelling Fiber Dressing, 4x5 in (silver alginate) 2 x Per Week/ ary Discharge Instructions: Apply silver alginate to wound bed as instructed Secondary Dressing: ABD Pad, 8x10 2 x Per Week/ Discharge Instructions: Apply over primary dressing as directed. Secondary Dressing: CarboFLEX Odor Control Dressing, 4x4 in 2 x Per Week/ Discharge Instructions: Apply over primary dressing as directed. Com pression Wrap: ThreePress (3 layer compression wrap) 2 x Per Week/  Discharge Instructions: Apply three layer compression as directed. 09/25/2021: From the time of his admission to the wound care center, there has been marked and dramatic improvement in the wound. T oday, I debrided a small amount of slough from the entire wound surface, revealing granulation tissue that will likely be suitable for TheraSkin application next week. For now, we will continue with topical gentamicin under silver alginate and 3 layer compression wraps. Based on the significant decrease in exudate and drainage, I do not think he will require a nurse visit next week. He was advised, however, that if he soaks through his dressing, develops unusual and increased pain, or any other concerning symptoms, he should call our office and we will get him in to be evaluated. Electronic Signature(s) Signed: 09/25/2021 1:30:26 PM By: [name] FACS Entered By: Duanne Guess on 09/25/2021 13:30:25 -------------------------------------------------------------------------------- HxROS Details Patient Name: Date of Service: [name] 09/25/2021 12:45 PM Medical Record Number: [id number] Patient Account Number: [id number] Date of Birth/Sex: Treating  RN: [birth year] (46 y.o. M) Primary Care Provider: [name] Other Clinician: Referring Provider: Treating Provider/Extender: Horton Marshall in Treatment: 63 Information Obtained From Patient Gastrointestinal Medical History: Past Medical History Notes: Chronic Gastritis Immunizations Pneumococcal Vaccine: Received Pneumococcal Vaccination: No Implantable Devices No devices added Family and Social History Unknown History: Yes; Current every day smoker; Marital Status - Married; Alcohol Use: Rarely; Drug Use: No History; Caffeine Use: Moderate; Financial Concerns: No; Food, Clothing or Shelter Needs: No; Support System Lacking: No; Transportation Concerns: No Physician Affirmation I have reviewed and agree with the above information. Electronic Signature(s) Signed: 09/25/2021 1:34:56 PM By: [name] FACS Entered By: Duanne Guess on 09/25/2021 13:25:41 -------------------------------------------------------------------------------- SuperBill Details Patient Name: Date of Service: [name] 09/25/2021 Medical Record Number: [id number] Patient Account Number: [id number] Date of Birth/Sex: Treating RN: [birth year] (46 y.o. M) Primary Care Provider: [name] Other Clinician: Referring Provider: Treating Provider/Extender: Horton Marshall in Treatment: 33 Diagnosis Coding ICD-10 Codes Code Description [phone] Non-pressure chronic ulcer of left ankle with other specified severity I87.332 Chronic venous hypertension (idiopathic) with ulcer and inflammation of left lower extremity Facility Procedures CPT4 Code: [id number] Description: 11042 - DEB SUBQ TISSUE 20 SQ CM/< ICD-10 Diagnosis Description L97.328 Non-pressure chronic ulcer of left ankle with other specified severity Modifier: Quantity: 1 Physician Procedures : CPT4 Code Description Modifier [id number] 11042 - WC PHYS SUBQ TISS 20 SQ CM ICD-10  Diagnosis Description L97.328 Non-pressure chronic ulcer of left ankle with other specified severity Quantity: 1 Electronic Signature(s) Signed: 09/25/2021 1:34:17 PM By: [name] FACS Entered By: Duanne Guess on 09/25/2021 13:34:16

## 2021-09-29 NOTE — Progress Notes (Signed)
KANON, RAUSCH ([id number]) Visit Report for 09/25/2021 Arrival Information Details Patient Name: Date of Service: [name] [place] NIE 09/25/2021 12:45 PM Medical Record Number: [id number] Patient Account Number: [id number] Date of Birth/Sex: Treating RN: 03-30-1976 (46 y.o. M) Primary Care Kyrell Ruacho: [name] Other Clinician: Referring Thurmond Hildebran: Treating Myisha Pickerel/Extender: Horton Marshall in Treatment: 48 Visit Information History Since Last Visit Added or deleted any medications: No Patient Arrived: Ambulatory Any new allergies or adverse reactions: No Arrival Time: 12:47 Had a fall or experienced change in No Accompanied By: translator activities of daily living that may affect Transfer Assistance: None risk of falls: Patient Identification Verified: Yes Signs or symptoms of abuse/neglect since last visito No Secondary Verification Process Completed: Yes Hospitalized since last visit: No Patient Requires Transmission-Based Precautions: No Implantable device outside of the clinic excluding No Patient Has Alerts: No cellular tissue based products placed in the center since last visit: Has Dressing in Place as Prescribed: Yes Pain Present Now: Yes Electronic Signature(s) Signed: 09/29/2021 7:53:36 AM By: Karl Ito Entered By: Karl Ito on 09/25/2021 12:48:22 -------------------------------------------------------------------------------- Compression Therapy Details Patient Name: Date of Service: [name] 09/25/2021 12:45 PM Medical Record Number: [id number] Patient Account Number: [id number] Date of Birth/Sex: Treating RN: 09-03-1975 (46 y.o. [name] Primary Care Gae Bihl: [name] Other Clinician: Referring Jozalyn Baglio: Treating Aiana Nordquist/Extender: Horton Marshall in Treatment: 33 Compression Therapy Performed for Wound Assessment: Wound #1 Left Ankle Performed By: Clinician  Zenaida Deed, RN Compression Type: Three Layer Post Procedure Diagnosis Same as Pre-procedure Electronic Signature(s) Signed: 09/25/2021 2:25:17 PM By: [name], BSN Entered By: Zenaida Deed on 09/25/2021 13:14:58 -------------------------------------------------------------------------------- Encounter Discharge Information Details Patient Name: Date of Service: [name] 09/25/2021 12:45 PM Medical Record Number: [id number] Patient Account Number: [id number] Date of Birth/Sex: Treating RN: 1976/04/27 (46 y.o. [name] Primary Care Ruthanne Mcneish: [name] Other Clinician: Referring Tomekia Helton: Treating Monquie Fulgham/Extender: Horton Marshall in Treatment: 79 Encounter Discharge Information Items Post Procedure Vitals Discharge Condition: Stable Temperature (F): 97.9 Ambulatory Status: Ambulatory Pulse (bpm): 85 Discharge Destination: Home Respiratory Rate (breaths/min): 18 Transportation: Private Auto Blood Pressure (mmHg): 113/65 Accompanied By: interpreter Schedule Follow-up Appointment: Yes Clinical Summary of Care: Patient Declined Electronic Signature(s) Signed: 09/25/2021 2:25:17 PM By: [name], BSN Entered By: Zenaida Deed on 09/25/2021 13:33:57 -------------------------------------------------------------------------------- Lower Extremity Assessment Details Patient Name: Date of Service: [name] 09/25/2021 12:45 PM Medical Record Number: [id number] Patient Account Number: [id number] Date of Birth/Sex: Treating RN: [birth year] (46 y.o. [name] Primary Care Kordae Buonocore: [name] Other Clinician: Referring Laytoya Ion: Treating Indalecio Malmstrom/Extender: Horton Marshall in Treatment: 33 Edema Assessment Assessed: [name]: No] [name]: No] Edema: [Left: N] [Right: o] Calf Left: Right: Point of Measurement: 28 cm From Medial Instep 31 cm Ankle Left:  Right: Point of Measurement: 8 cm From Medial Instep 22.5 cm Vascular Assessment Pulses: Dorsalis Pedis Palpable: [Left:Yes] Electronic Signature(s) Signed: 09/25/2021 2:25:17 PM By: [name], BSN Entered By: Zenaida Deed on 09/25/2021 13:07:24 -------------------------------------------------------------------------------- Multi Wound Chart Details Patient Name: Date of Service: [name] 09/25/2021 12:45 PM Medical Record Number: [id number] Patient Account Number: [id number] Date of Birth/Sex: Treating RN: 1976/01/19 (46 y.o. M) Primary Care Donnell Beauchamp: [name] Other Clinician: Referring Noralee Dutko: Treating Mane Consolo/Extender: Horton Marshall in Treatment: 5 Vital Signs Height(in): Pulse(bpm): 85 Weight(lbs): Blood Pressure(mmHg): 113/65 Body Mass Index(BMI): Temperature(F): 97.9 Respiratory Rate(breaths/min): 18 Photos: [N/A:N/A] Left  Ankle N/A N/A Wound Location: Trauma N/A N/A Wounding Event: Trauma, Other N/A N/A Primary Etiology: 10/14/2020 N/A N/A Date Acquired: 73 N/A N/A Weeks of Treatment: Open N/A N/A Wound Status: No N/A N/A Wound Recurrence: 2.2x6.5x0.2 N/A N/A Measurements L x W x D (cm) 11.231 N/A N/A A (cm) : rea 2.246 N/A N/A Volume (cm) : 81.80% N/A N/A % Reduction in A rea: 90.90% N/A N/A % Reduction in Volume: Full Thickness Without Exposed N/A N/A Classification: Support Structures Medium N/A N/A Exudate A mount: Serosanguineous N/A N/A Exudate Type: red, brown N/A N/A Exudate Color: Flat and Intact N/A N/A Wound Margin: Large (67-100%) N/A N/A Granulation A mount: Red N/A N/A Granulation Quality: Small (1-33%) N/A N/A Necrotic A mount: Fat Layer (Subcutaneous Tissue): Yes N/A N/A Exposed Structures: Fascia: No Tendon: No Muscle: No Joint: No Bone: No Small (1-33%) N/A N/A Epithelialization: Debridement - Excisional N/A N/A Debridement: Pre-procedure  Verification/Time Out 13:10 N/A N/A Taken: Other N/A N/A Pain Control: Subcutaneous, Slough N/A N/A Tissue Debrided: Skin/Subcutaneous Tissue N/A N/A Level: 12.1 N/A N/A Debridement A (sq cm): rea Curette N/A N/A Instrument: Minimum N/A N/A Bleeding: Pressure N/A N/A Hemostasis A chieved: 6 N/A N/A Procedural Pain: 4 N/A N/A Post Procedural Pain: Procedure was tolerated well N/A N/A Debridement Treatment Response: 2.2x6.5x0.2 N/A N/A Post Debridement Measurements L x W x D (cm) 2.246 N/A N/A Post Debridement Volume: (cm) Compression Therapy N/A N/A Procedures Performed: Debridement Treatment Notes Electronic Signature(s) Signed: 09/25/2021 1:22:30 PM By: [name] FACS Entered By: Duanne Guess on 09/25/2021 13:22:29 -------------------------------------------------------------------------------- Multi-Disciplinary Care Plan Details Patient Name: Date of Service: [name] 09/25/2021 12:45 PM Medical Record Number: [id number] Patient Account Number: [id number] Date of Birth/Sex: Treating RN: [birth year] (46 y.o. [name] Primary Care Dilynn Munroe: [name] Other Clinician: Referring Kilan Banfill: Treating Cartel Mauss/Extender: Horton Marshall in Treatment: 33 Multidisciplinary Care Plan reviewed with physician Active Inactive Wound/Skin Impairment Nursing Diagnoses: Knowledge deficit related to ulceration/compromised skin integrity Goals: Patient/caregiver will verbalize understanding of skin care regimen Date Initiated: 02/05/2021 Target Resolution Date: 10/03/2021 Goal Status: Active Interventions: Assess patient/caregiver ability to obtain necessary supplies Assess patient/caregiver ability to perform ulcer/skin care regimen upon admission and as needed Provide education on ulcer and skin care Treatment Activities: Skin care regimen initiated : 02/05/2021 Topical wound management initiated :  02/05/2021 Notes: 03/31/21: Wound care regimen ongoing, target date extended. 04/21/21: Wound care ongoing, through interpreter patient states he is doing fine with his dressing changes. Electronic Signature(s) Signed: 09/25/2021 2:25:17 PM By: [name], BSN Entered By: Zenaida Deed on 09/25/2021 13:13:14 -------------------------------------------------------------------------------- Pain Assessment Details Patient Name: Date of Service: [name] 09/25/2021 12:45 PM Medical Record Number: [id number] Patient Account Number: [id number] Date of Birth/Sex: Treating RN: 1976/06/24 (46 y.o. M) Primary Care Ryder Chesmore: [name] Other Clinician: Referring Kerman Pfost: Treating Haile Bosler/Extender: Horton Marshall in Treatment: 60 Active Problems Location of Pain Severity and Description of Pain Patient Has Paino Yes Site Locations Rate the pain. Rate the pain. Current Pain Level: 5 Pain Management and Medication Current Pain Management: Electronic Signature(s) Signed: 09/29/2021 7:53:36 AM By: Karl Ito Entered By: Karl Ito on 09/25/2021 12:48:51 -------------------------------------------------------------------------------- Patient/Caregiver Education Details Patient Name: Date of Service: [name] 2/24/2023andnbsp12:45 PM Medical Record Number: [id number] Patient Account Number: [id number] Date of Birth/Gender: Treating RN: 18-Nov-1975 (46 y.o. [name] Primary Care Physician: [name] Other Clinician: Referring Physician: Treating Physician/Extender: Leonard Downing,  Marolyn Hammock in Treatment: 33 Education Assessment Education Provided To: Patient Education Topics Provided Venous: Methods: Explain/Verbal Responses: Reinforcements needed, State content correctly Wound/Skin Impairment: Methods: Explain/Verbal Responses: Reinforcements needed, State content correctly Electronic  Signature(s) Signed: 09/25/2021 2:25:17 PM By: [name], BSN Entered By: Zenaida Deed on 09/25/2021 13:13:39 -------------------------------------------------------------------------------- Wound Assessment Details Patient Name: Date of Service: [name] 09/25/2021 12:45 PM Medical Record Number: [id number] Patient Account Number: [id number] Date of Birth/Sex: Treating RN: June 22, 1976 (46 y.o. M) Primary Care Ceniyah Thorp: [name] Other Clinician: Referring Raeven Pint: Treating Tunisha Ruland/Extender: Horton Marshall in Treatment: 33 Wound Status Wound Number: 1 Primary Etiology: Trauma, Other Wound Location: Left Ankle Wound Status: Open Wounding Event: Trauma Date Acquired: 10/14/2020 Weeks Of Treatment: 33 Clustered Wound: No Photos Wound Measurements Length: (cm) 2.2 Width: (cm) 6.5 Depth: (cm) 0.2 Area: (cm) 11.231 Volume: (cm) 2.246 % Reduction in Area: 81.8% % Reduction in Volume: 90.9% Epithelialization: Small (1-33%) Tunneling: No Undermining: No Wound Description Classification: Full Thickness Without Exposed Support Structures Wound Margin: Flat and Intact Exudate Amount: Medium Exudate Type: Serosanguineous Exudate Color: red, brown Foul Odor After Cleansing: No Slough/Fibrino Yes Wound Bed Granulation Amount: Large (67-100%) Exposed Structure Granulation Quality: Red Fascia Exposed: No Necrotic Amount: Small (1-33%) Fat Layer (Subcutaneous Tissue) Exposed: Yes Necrotic Quality: Adherent Slough Tendon Exposed: No Muscle Exposed: No Joint Exposed: No Bone Exposed: No Treatment Notes Wound #1 (Ankle) Wound Laterality: Left Cleanser Soap and Water Discharge Instruction: May shower and wash wound with dial antibacterial soap and water prior to dressing change. Wound Cleanser Discharge Instruction: Cleanse the wound with wound cleanser prior to applying a clean dressing using gauze sponges, not tissue or  cotton balls. Peri-Wound Care Triamcinolone 15 (g) Discharge Instruction: T wound and leg o Zinc Oxide Ointment 30g tube Discharge Instruction: Apply Zinc Oxide to periwound with each dressing change Sween Lotion (Moisturizing lotion) Discharge Instruction: Apply moisturizing lotion as directed Topical Gentamicin Discharge Instruction: Apply directly to wound bed, under silver alginate Primary Dressing KerraCel Ag Gelling Fiber Dressing, 4x5 in (silver alginate) Discharge Instruction: Apply silver alginate to wound bed as instructed Secondary Dressing ABD Pad, 8x10 Discharge Instruction: Apply over primary dressing as directed. CarboFLEX Odor Control Dressing, 4x4 in Discharge Instruction: Apply over primary dressing as directed. Secured With Compression Wrap ThreePress (3 layer compression wrap) Discharge Instruction: Apply three layer compression as directed. Compression Stockings Add-Ons Electronic Signature(s) Signed: 09/25/2021 2:25:17 PM By: [name], BSN Entered By: Zenaida Deed on 09/25/2021 13:08:09 -------------------------------------------------------------------------------- Vitals Details Patient Name: Date of Service: [name] 09/25/2021 12:45 PM Medical Record Number: [id number] Patient Account Number: [id number] Date of Birth/Sex: Treating RN: 1975/12/10 (46 y.o. M) Primary Care Angelica Frandsen: [name] Other Clinician: Referring Jakeira Seeman: Treating Everley Evora/Extender: Horton Marshall in Treatment: 70 Vital Signs Time Taken: 12:48 Temperature (F): 97.9 Pulse (bpm): 85 Respiratory Rate (breaths/min): 18 Blood Pressure (mmHg): 113/65 Reference Range: 80 - 120 mg / dl Electronic Signature(s) Signed: 09/29/2021 7:53:36 AM By: Karl Ito Entered By: Karl Ito on 09/25/2021 12:48:40

## 2021-09-30 DIAGNOSIS — Z419 Encounter for procedure for purposes other than remedying health state, unspecified: Secondary | ICD-10-CM | POA: Diagnosis not present

## 2021-10-02 ENCOUNTER — Encounter (HOSPITAL_BASED_OUTPATIENT_CLINIC_OR_DEPARTMENT_OTHER): Payer: Medicaid Other | Attending: General Surgery | Admitting: General Surgery

## 2021-10-02 ENCOUNTER — Other Ambulatory Visit: Payer: Self-pay

## 2021-10-02 DIAGNOSIS — L97322 Non-pressure chronic ulcer of left ankle with fat layer exposed: Secondary | ICD-10-CM | POA: Diagnosis not present

## 2021-10-02 DIAGNOSIS — L97328 Non-pressure chronic ulcer of left ankle with other specified severity: Secondary | ICD-10-CM | POA: Insufficient documentation

## 2021-10-02 DIAGNOSIS — F17200 Nicotine dependence, unspecified, uncomplicated: Secondary | ICD-10-CM | POA: Insufficient documentation

## 2021-10-02 DIAGNOSIS — I87332 Chronic venous hypertension (idiopathic) with ulcer and inflammation of left lower extremity: Secondary | ICD-10-CM | POA: Insufficient documentation

## 2021-10-02 NOTE — Progress Notes (Signed)
[name], DUTCH ([id number]) Visit Report for 10/02/2021 Arrival Information Details Patient Name: Date of Service: [name] [place] NIE 10/02/2021 9:30 A M Medical Record Number: [id number] Patient Account Number: [id number] Date of Birth/Sex: Treating RN: 07-06-1976 (46 y.o. M) Primary Care Alessio Bogan: [name] Other Clinician: Referring Mikia Delaluz: Treating Marykate Heuberger/Extender: Horton Marshall in Treatment: 34 Visit Information History Since Last Visit Added or deleted any medications: No Patient Arrived: Ambulatory Any new allergies or adverse reactions: No Arrival Time: 09:33 Had a fall or experienced change in No Accompanied By: translator activities of daily living that may affect Transfer Assistance: None risk of falls: Patient Identification Verified: Yes Signs or symptoms of abuse/neglect since last visito No Secondary Verification Process Completed: Yes Hospitalized since last visit: No Patient Requires Transmission-Based Precautions: No Implantable device outside of the clinic excluding No Patient Has Alerts: No cellular tissue based products placed in the center since last visit: Has Dressing in Place as Prescribed: Yes Pain Present Now: No Electronic Signature(s) Signed: 10/02/2021 1:10:25 PM By: Karl Ito Entered By: Karl Ito on 10/02/2021 09:34:16 -------------------------------------------------------------------------------- Compression Therapy Details Patient Name: Date of Service: [name] 10/02/2021 9:30 A M Medical Record Number: [id number] Patient Account Number: [id number] Date of Birth/Sex: Treating RN: 1975/12/30 (46 y.o. [name] Primary Care Elfego Giammarino: [name] Other Clinician: Referring Sephiroth Mcluckie: Treating Siegfried Vieth/Extender: Horton Marshall in Treatment: 34 Compression Therapy Performed for Wound Assessment: Wound #1 Left Ankle Performed By: Clinician Karie Schwalbe, RN Compression Type: Three Layer Post Procedure Diagnosis Same as Pre-procedure Electronic Signature(s) Signed: 10/02/2021 4:40:23 PM By: Karie Schwalbe RN Entered By: Karie Schwalbe on 10/02/2021 15:49:34 -------------------------------------------------------------------------------- Encounter Discharge Information Details Patient Name: Date of Service: [name] 10/02/2021 9:30 A M Medical Record Number: [id number] Patient Account Number: [id number] Date of Birth/Sex: Treating RN: Jul 12, 1976 (46 y.o. [name] Primary Care Shontia Gillooly: [name] Other Clinician: Referring Natayla Cadenhead: Treating Keryn Nessler/Extender: Horton Marshall in Treatment: 9 Encounter Discharge Information Items Post Procedure Vitals Discharge Condition: Stable Temperature (F): 98.3 Ambulatory Status: Ambulatory Pulse (bpm): 71 Discharge Destination: Home Respiratory Rate (breaths/min): 18 Transportation: Private Auto Blood Pressure (mmHg): 104/69 Accompanied By: friend Schedule Follow-up Appointment: Yes Clinical Summary of Care: Patient Declined Notes Patient needs an Print production planner) Signed: 10/02/2021 4:40:23 PM By: Karie Schwalbe RN Entered By: Karie Schwalbe on 10/02/2021 16:18:18 -------------------------------------------------------------------------------- Lower Extremity Assessment Details Patient Name: Date of Service: [name] 10/02/2021 9:30 A M Medical Record Number: [id number] Patient Account Number: [id number] Date of Birth/Sex: Treating RN: [birth year] (46 y.o. [name] Primary Care Damaria Vachon: [name] Other Clinician: Referring Luke Falero: Treating Cerenity Goshorn/Extender: Horton Marshall in Treatment: 34 Edema Assessment Assessed: [name]: No] [name]: No] Edema: [Left: N] [Right: o] Calf Left: Right: Point of Measurement: 28 cm From Medial Instep 31 cm Ankle Left:  Right: Point of Measurement: 8 cm From Medial Instep 21.6 cm Electronic Signature(s) Signed: 10/02/2021 4:40:23 PM By: Karie Schwalbe RN Entered By: Karie Schwalbe on 10/02/2021 09:49:42 -------------------------------------------------------------------------------- Multi Wound Chart Details Patient Name: Date of Service: [name] 10/02/2021 9:30 A M Medical Record Number: [id number] Patient Account Number: [id number] Date of Birth/Sex: Treating RN: 09/21/1975 (46 y.o. M) Primary Care Vallory Oetken: [name] Other Clinician: Referring Cherie Lasalle: Treating Loyce Flaming/Extender: Horton Marshall in Treatment: 34 Vital Signs Height(in): Pulse(bpm): 71 Weight(lbs): Blood Pressure(mmHg): 104/69 Body Mass Index(BMI): Temperature(F): 98.3 Respiratory Rate(breaths/min): 18 Photos: [N/A:N/A] Left  Ankle N/A N/A Wound Location: Trauma N/A N/A Wounding Event: Trauma, Other N/A N/A Primary Etiology: 10/14/2020 N/A N/A Date Acquired: 36 N/A N/A Weeks of Treatment: Open N/A N/A Wound Status: No N/A N/A Wound Recurrence: 2.2x6.4x0.2 N/A N/A Measurements L x W x D (cm) 11.058 N/A N/A A (cm) : rea 2.212 N/A N/A Volume (cm) : 82.00% N/A N/A % Reduction in A rea: 91.00% N/A N/A % Reduction in Volume: Full Thickness Without Exposed N/A N/A Classification: Support Structures Medium N/A N/A Exudate A mount: Serosanguineous N/A N/A Exudate Type: red, brown N/A N/A Exudate Color: Flat and Intact N/A N/A Wound Margin: Large (67-100%) N/A N/A Granulation A mount: Red, Pink N/A N/A Granulation Quality: Small (1-33%) N/A N/A Necrotic A mount: Fascia: No N/A N/A Exposed Structures: Fat Layer (Subcutaneous Tissue): No Tendon: No Muscle: No Joint: No Bone: No Small (1-33%) N/A N/A Epithelialization: Debridement - Selective/Open Wound N/A N/A Debridement: Pre-procedure Verification/Time Out 10:06 N/A N/A Taken: Other N/A N/A Pain  Control: Slough N/A N/A Tissue Debrided: Non-Viable Tissue N/A N/A Level: 14.08 N/A N/A Debridement A (sq cm): rea Curette N/A N/A Instrument: Minimum N/A N/A Bleeding: Pressure N/A N/A Hemostasis A chieved: 1 N/A N/A Procedural Pain: 1 N/A N/A Post Procedural Pain: Procedure was tolerated well N/A N/A Debridement Treatment Response: 2.2x6.4x0.2 N/A N/A Post Debridement Measurements L x W x D (cm) 2.212 N/A N/A Post Debridement Volume: (cm) Debridement N/A N/A Procedures Performed: Treatment Notes Electronic Signature(s) Signed: 10/02/2021 12:43:07 PM By: [name] FACS Entered By: Duanne Guess on 10/02/2021 12:43:06 -------------------------------------------------------------------------------- Pain Assessment Details Patient Name: Date of Service: [name] 10/02/2021 9:30 A M Medical Record Number: [id number] Patient Account Number: [id number] Date of Birth/Sex: Treating RN: 24-Nov-1975 (46 y.o. M) Primary Care Anyela Napierkowski: [name] Other Clinician: Referring Ravin Denardo: Treating Khyle Goodell/Extender: Horton Marshall in Treatment: 61 Active Problems Location of Pain Severity and Description of Pain Patient Has Paino No Site Locations Pain Management and Medication Current Pain Management: Electronic Signature(s) Signed: 10/02/2021 1:10:25 PM By: Karl Ito Entered By: Karl Ito on 10/02/2021 09:37:10 -------------------------------------------------------------------------------- Patient/Caregiver Education Details Patient Name: Date of Service: [name] 3/3/2023andnbsp9:30 A M Medical Record Number: [id number] Patient Account Number: [id number] Date of Birth/Gender: Treating RN: 29-May-1976 (46 y.o. [name] Primary Care Physician: [name] Other Clinician: Referring Physician: Treating Physician/Extender: Horton Marshall in Treatment:  45 Education Assessment Education Provided To: Patient Education Topics Provided Wound/Skin Impairment: Methods: Explain/Verbal Responses: Return demonstration correctly Electronic Signature(s) Signed: 10/02/2021 4:40:23 PM By: Karie Schwalbe RN Entered By: Karie Schwalbe on 10/02/2021 16:04:21 -------------------------------------------------------------------------------- Wound Assessment Details Patient Name: Date of Service: [name] 10/02/2021 9:30 A M Medical Record Number: [id number] Patient Account Number: [id number] Date of Birth/Sex: Treating RN: [birth year] (46 y.o. M) Primary Care Storm Sovine: [name] Other Clinician: Referring Deby Adger: Treating Rexine Gowens/Extender: Horton Marshall in Treatment: 34 Wound Status Wound Number: 1 Primary Etiology: Trauma, Other Wound Location: Left Ankle Wound Status: Open Wounding Event: Trauma Date Acquired: 10/14/2020 Weeks Of Treatment: 34 Clustered Wound: No Photos Wound Measurements Length: (cm) 2.2 Width: (cm) 6.4 Depth: (cm) 0.2 Area: (cm) 11.058 Volume: (cm) 2.212 % Reduction in Area: 82% % Reduction in Volume: 91% Epithelialization: Small (1-33%) Tunneling: No Undermining: No Wound Description Classification: Full Thickness Without Exposed Support Structures Wound Margin: Flat and Intact Exudate Amount: Medium Exudate Type: Serosanguineous Exudate Color: red, brown Foul Odor After Cleansing: No Slough/Fibrino Yes  Wound Bed Granulation Amount: Large (67-100%) Exposed Structure Granulation Quality: Red, Pink Fascia Exposed: No Necrotic Amount: Small (1-33%) Fat Layer (Subcutaneous Tissue) Exposed: No Necrotic Quality: Adherent Slough Tendon Exposed: No Muscle Exposed: No Joint Exposed: No Bone Exposed: No Treatment Notes Wound #1 (Ankle) Wound Laterality: Left Cleanser Soap and Water Discharge Instruction: May shower and wash wound with dial antibacterial soap and  water prior to dressing change. Wound Cleanser Discharge Instruction: Cleanse the wound with wound cleanser prior to applying a clean dressing using gauze sponges, not tissue or cotton balls. Peri-Wound Care Triamcinolone 15 (g) Discharge Instruction: T wound and leg o Zinc Oxide Ointment 30g tube Discharge Instruction: Apply Zinc Oxide to periwound with each dressing change Sween Lotion (Moisturizing lotion) Discharge Instruction: Apply moisturizing lotion as directed Topical Gentamicin Discharge Instruction: Apply to wound that is not covered with Theraskin Primary Dressing KerraCel Ag Gelling Fiber Dressing, 4x5 in (silver alginate) Discharge Instruction: apply to area not covered by Theraskin Secondary Dressing ABD Pad, 8x10 Discharge Instruction: Apply over primary dressing as directed. CarboFLEX Odor Control Dressing, 4x4 in Discharge Instruction: Apply over primary dressing as directed. Secured With Compression Wrap ThreePress (3 layer compression wrap) Discharge Instruction: Apply three layer compression as directed. Compression Stockings Add-Ons Electronic Signature(s) Signed: 10/02/2021 4:40:23 PM By: Karie Schwalbe RN Entered By: Karie Schwalbe on 10/02/2021 09:47:54 -------------------------------------------------------------------------------- Vitals Details Patient Name: Date of Service: [name] 10/02/2021 9:30 A M Medical Record Number: [id number] Patient Account Number: [id number] Date of Birth/Sex: Treating RN: [birth year] (46 y.o. M) Primary Care Sylvia Kondracki: [name] Other Clinician: Referring Jaslin Novitski: Treating Ennis Heavner/Extender: Horton Marshall in Treatment: 34 Vital Signs Time Taken: 09:34 Temperature (F): 98.3 Pulse (bpm): 71 Respiratory Rate (breaths/min): 18 Blood Pressure (mmHg): 104/69 Reference Range: 80 - 120 mg / dl Electronic Signature(s) Signed: 10/02/2021 1:10:25 PM By: Karl Ito Entered  By: Karl Ito on 10/02/2021 09:37:04

## 2021-10-05 NOTE — Progress Notes (Addendum)
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 drainage associated with this. CLINICAL DATA: Left ankle swelling and pain. Wound. EXAM: LEFT ANKLE COMPLETE - 3+ VIEW COMPARISON: No prior. FINDINGS: Diffuse soft tissue swelling. Diffuse osteopenia degenerative change. Ossification noted over the high CS number a. no acute bony abnormality identified. No evidence of fracture. IMPRESSION: 1. Diffuse osteopenia and degenerative change. No acute abnormality identified. No acute bony abnormality identified. 2. Diffuse soft tissue swelling. No radiopaque foreign body. Past medical history; left ankle trauma as noted in 2003. The patient is a smoker he is not a diabetic lives with his wife. 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 He was brought here as a refugee 02/11/2021; patient's ulcer is certainly no better today perhaps even more necrotic in the surface. Marked odor a lot of drainage which seep down into his normal skin below the ulcer on his lateral heel. X-ray I repeated last time was negative. Culture grew strep agalactiae perhaps not completely well covered by doxycycline that I gave him empirically. 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 Clearly left the [place] with something on the leg that rapidly expanded starting in March. He immigrated to the [place] on 05/22/2021. Other issues of importance is he has Medicaid which makes it difficult to get wound care supplies for dressings 7/20; the patient looks somewhat better with less of a necrotic surface. The odor is also improved. He is finishing the round of cephalexin I gave him I am not sure if that is the reason this is improved or whether this is all just colonized bacteria. In any case the patient says it is less painful and there appears to be less drainage. The patient was kindly seen by Dr. Verdie Drown after my conversation with Dr. Algis Liming last week. He has recommended biopsy with histology stain for fungal and AFB. As well as a separate sample in saline for AFB culture fungal culture and bacterial culture. A separate sample can be sent to the [facility] for molecular testing for mycobacteriaooMycobacterium ulcerans/[place] ulcer I do not believe that this is some of the more atypical ulcers we see including pyoderma gangrenosum /pemphigus. It is quite possible that there is vascular issues here and I have tried to get him in for arterial and venous evaluation. Certainly the latter could be playing a primary role. 7/27; patient comes in with a wound absolutely no better. Marked malodor although he missed his appointment earlier this week for a dressing change. We still do not have vascular evaluation I ordered arterial and venous.  Again there are issues with communication here. He has completed the antibiotics I initially gave him for strep. I thought he was making some improvements but really no improvement in any aspect of this wound today. 8/5; interpreter present over the phone. Patient reports improvement in wound healing. He is currently taking the antibiotics prescribed by Dr. Luciana Axe (infectious disease). He has no issues or  complaints today. He denies signs of infection. 03/10/2021 upon evaluation today patient appears to be doing okay in regard to his wound. This is measuring a little bit smaller. Does have a lot of slough and biofilm noted on the surface of the wound. I do believe that sharp debridement would be of benefit for him. 8/23; 3 and half weeks since I last saw this man. Quite an improvement. I note the biopsy I did was nonspecific stains for Mycobacterium and fungi were negative. He has been following with Dr. Timmothy Euler who is been helpful prescribing clarithromycin and Bactrim. He has now completed this. He also had arterial and venous studies. His arterial study on the right showed an ABI of 1.10 with a TBI of 1.08 on the left unfortunately they did not remove the bandages but his TBI was 0.73 which is normal. He also had venous reflux studies these showed evidence of venous reflux at the greater saphenous vein at the saphenofemoral junction as well as the greater saphenous vein proximally in the thigh but no reflux in the calf Things are quite a bit better than the last time I saw him although the progress is slow. We have been using silver alginate. 8/30; generally continuing improvement in surface area and condition of the wound surface we have been using Hydrofera Blue under compression. The patient's only complaint through the [place] interpreter is that he has some degree of itching 9/6; continued improvement in overall surface area down 1 cm in width we have been using Hydrofera Blue. We have interviewed him through a [place] interpreter today. He reports no additional issues 9/13 not much change in surface area today. We have been using Hydrofera Blue. He was interviewed through the [place] interpreter today. Still have him under compression. We used Molecu[employer] imaging 9/20; the wound is actually larger in its width. Also noted an odor and drainage. I used Iodoflex last time to help with the  debris on the surface. He is not on any antibiotics. We did this interview through the [place] interpreter 9/27; better and with today. Odor and drainage seems better. We use silver alginate last time and that seems to have helped. We used his neighbor his [place] interpreter 10/4; improved length and improved condition of the wound bed. We have been using silver alginate. We interviewed him through his [place] interpreter. I am going to have vein and vascular look at this including his reflux studies. He came into the clinic with a very angry inflamed wound that admitted there for many months. This now looks a lot better. He did not have anything in the calf on the left that had significant reflux although he did have it in his thigh. I want to make sure that everything can be done for this man to prevent this from reoccurring He has Medicaid and we might be able to order him a TheraSkin for an advanced treatment option. We will look into this. 10/14; patient comes in after a 10-day hiatus. Drainage weeping through his wrap. Marked malodor although the surface of the wound does not look so bad and dimensions are  about the same. Through the interpreter on the phone he is not complaining of pain 10/20; wound surface covered in fibrinous debris. This is largely on the lateral part of his foot. We interviewed him through a interpreter on the phone A little more drainage reported by our nurses. We have been using silver alginate under compression with sit to fit and CarboFlex He has been to see infectious disease Dr. Luciana Axe. Noted that he has been on Bactrim and clarithromycin for possible mycobacterial or other indolent infection. I am not sure if he is still taking antibiotics but these are listed as being discontinued and by infectious disease 10/27; our intake nurse reported large amount of drainage today more than usual. We have been using silver alginate. He still has not seen vein and  vascular about the reflux studies I am not sure what the issue is here. He is very itchy under the wound on the left lateral foot The patient comes into clinic concerned that the 1 year of Medicaid that apparently was assigned to him when he entered the [place]. This is now coming to an end. I told him that I thought the best thing to do is the county social services [facility] social services I am not sure how else to help him with this. We of course will not discharge him which I think was his concern. He does have an appointment with Dr. Myra Gianotti on 11/7 with regards to the reflux studies. 11/8; the patient saw Dr. Myra Gianotti who noted mild at the saphenofemoral junction on the right but he did not feel that the vein was pathologic and he did not feel he would benefit from laser ablation. Suggested continuing to focus on wound care. We are using silver alginate with Bactroban 11/17; wound looks about the same. Still a fair amount of drainage here. Although the wound is coming in surface area it still a deep wound full-thickness. I am using silver alginate with Bactroban He really applied for Medicaid. Wondering about a skin graft. I am uncertain about that right now because of the drainage 12/1; wound is measuring slightly smaller in width. Surface of this looks better. Changed him to [facility] still using topical Bactroban 12/8; no major change in dimensions although the surface looks excellent we have been using Bactroban and covering Hydrofera Blue. Considering application for TheraSkin if it is available through his version of Medicaid 12/15; nice healthy appearing wound advancing epithelialization 12/22; improvement in surface area using Bactroban under Hydrofera Blue. Originally a difficult large wound likely secondary to chronic venous insufficiency 08/06/2021; no major change in surface area. We are using Bactroban under Hydrofera Blue 08/19/2021; we are using Sorbact with  covering calcium alginate and attempt to get a better looking wound surface with less debris.Still under compression He is denied for TheraSkin by his version of Medicaid. This is in it self not that surprising 1/26; using Sorbact with covering silver alginate. Surfaces look better except for the lateral part of the left ankle wound. With the efforts of our staff we have him approved for TheraSkin through [facility] [previously we did not run the correct Medicaid version] 2/2; using Sorbact. Unfortunately the patient comes in with a large area of necrotic debris very malodorous. No clear surrounding infection. He is approved for TheraSkin but the wound bed just is not ready for that at this point. 2/9; because of the odor and debris last time we did not go ahead with Elgie Collard has a $  4 affordable co-pay per application]. PCR culture I did last week showed high titers of E. coli moderate titers of Klebsiella and low titers of Pseudomonas Peptostreptococcus which is anaerobic. Does not have evidence of surrounding infection I have therefore elected to treat this with topical gentamicin under the silver alginate. Also with aggressive debridement 2/16; I'm using topical gentamicin to cover the culture gram negatives under silver alginate. Where making nice progress on this wound. I'm still have the thorough skin in reserve but I'm not ready to apply that next week perhaps ordero He still requiring debridement but overall the wound surfaces look a lot better 09/25/2021: I reviewed old images and I am truly impressed with the significant improvement over time. He is still getting topical gentamicin under silver alginate with 3 layer compression. There has been substantial epithelialization. Drainage has improved and is significantly less. There is still some slough at the base, granulation tissue is forming. I think he is likely to be ready for TheraSkin application next week. 10/02/2021: There is just a  minimal amount of slough present that was easily removed with a curette. Granulation tissue was present. TheraSkin and TheraSkin representative are on site for placement today. Patient History Information obtained from Patient. Family History Unknown History. Social History Current every day smoker, Marital Status - Married, Alcohol Use - Rarely, Drug Use - No History, Caffeine Use - Moderate. Medical A Surgical History Notes nd Gastrointestinal Chronic Gastritis Objective Constitutional No acute distress. Vitals Time Taken: 9:34 AM, Temperature: 98.3 F, Pulse: 71 bpm, Respiratory Rate: 18 breaths/min, Blood Pressure: 104/69 mmHg. Respiratory Normal work of breathing on room air. General Notes: 10/02/2021: Wound examoovery minimal light slough which was easily removed with a #5 curette. The wound continues to epithelialize. Edema control remains excellent. Minimal drainage without odor. Integumentary (Hair, Skin) Wound #1 status is Open. Original cause of wound was Trauma. The date acquired was: 10/14/2020. The wound has been in treatment 34 weeks. The wound is located on the Left Ankle. The wound measures 2.2cm length x 6.4cm width x 0.2cm depth; 11.058cm^2 area and 2.212cm^3 volume. There is no tunneling or undermining noted. There is a medium amount of serosanguineous drainage noted. The wound margin is flat and intact. There is large (67-100%) red, pink granulation within the wound bed. There is a small (1-33%) amount of necrotic tissue within the wound bed including Adherent Slough. 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 There was a Selective/Open Wound Non-Viable Tissue Debridement with a total area of 14.08 sq cm  performed by Duanne Guess, MD. With the following instrument(s): Curette to remove Non-Viable tissue/material. Material removed includes [facility] after achieving pain control using Other (benzocaine 20%). No specimens were taken. A time out was conducted at 10:06, prior to the start of the procedure. A Minimum amount of bleeding was controlled with Pressure. The procedure was tolerated well with a pain level of 1 throughout and a pain level of 1 following the procedure. Post Debridement Measurements: 2.2cm length x 6.4cm width x 0.2cm depth; 2.212cm^3 volume. Character of Wound/Ulcer Post Debridement is improved. Post procedure Diagnosis Wound #1: Same as Pre-Procedure Pre-procedure diagnosis of Wound #1 is a Trauma, Other located on the Left Ankle. A skin graft procedure using a bioengineered skin substitute/cellular or tissue based product  was performed by Duanne Guessannon, Caden Fukushima, MD with the following instrument(s): Forceps. Theraskin was applied and secured with Steri-Strips. 13 sq cm of product was utilized and 0 sq cm was wasted. Post Application, ABD pad and compression wrap was applied. A Time Out was conducted at 10:36, prior to the start of the procedure. The procedure was tolerated well with a pain level of 0 throughout and a pain level of 0 following the procedure. Post procedure Diagnosis Wound #1: Same as Pre-Procedure . Pre-procedure diagnosis of Wound #1 is a Trauma, Other located on the Left Ankle . There was a Three Layer Compression Therapy Procedure by Karie SchwalbeScotton, Joanne, RN. Post procedure Diagnosis Wound #1: Same as Pre-Procedure Plan Follow-up Appointments: Return Appointment in 2 weeks. - Dr Lady Garyannon -Interpreter required Nurse Visit: - Return next week for Nurse Visit Cellular or Tissue Based Products: Cellular or Tissue Based Product applied to wound bed, secured with steri-strips, cover with Adaptic or Mepitel. (DO NOT REMOVE). - Theraskin #1 in place,next week for an  appointment Bathing/ Shower/ Hygiene: May shower with protection but do not get wound dressing(s) wet. Edema Control - Lymphedema / SCD / Other: Elevate legs to the level of the heart or above for 30 minutes daily and/or when sitting, a frequency of: - 3-4 times a day throughout the day. Avoid standing for long periods of time. Exercise regularly Off-Loading: Open toe surgical shoe to: - left foot WOUND #1: - Ankle Wound Laterality: Left Cleanser: Soap and Water 2 x Per Week/ Discharge Instructions: May shower and wash wound with dial antibacterial soap and water prior to dressing change. Cleanser: Wound Cleanser 2 x Per Week/ Discharge Instructions: Cleanse the wound with wound cleanser prior to applying a clean dressing using gauze sponges, not tissue or cotton balls. Peri-Wound Care: Triamcinolone 15 (g) 2 x Per Week/ Discharge Instructions: T wound and leg o Peri-Wound Care: Zinc Oxide Ointment 30g tube 2 x Per Week/ Discharge Instructions: Apply Zinc Oxide to periwound with each dressing change Peri-Wound Care: Sween Lotion (Moisturizing lotion) 2 x Per Week/ Discharge Instructions: Apply moisturizing lotion as directed Topical: Gentamicin 2 x Per Week/ Discharge Instructions: Apply to wound that is not covered with Theraskin Prim Dressing: KerraCel Ag Gelling Fiber Dressing, 4x5 in (silver alginate) 2 x Per Week/ ary Discharge Instructions: apply to area not covered by Theraskin Secondary Dressing: ABD Pad, 8x10 2 x Per Week/ Discharge Instructions: Apply over primary dressing as directed. Secondary Dressing: CarboFLEX Odor Control Dressing, 4x4 in 2 x Per Week/ Discharge Instructions: Apply over primary dressing as directed. Com pression Wrap: ThreePress (3 layer compression wrap) 2 x Per Week/ Discharge Instructions: Apply three layer compression as directed. 10/02/2021: TheraSkin #1 applied today in standard fashion. The piece ordered was not quite big enough to cover the  smaller lesion on the anterior ankle but we had good coverage of the larger lateral wound. We will continue topical gentamicin and silver alginate to the smaller area. Continue 3 layer compression. He will have a nurse visit next week to inspect the area, at which time I will also take a look. He will have a formal visit with me in 2 weeks. Electronic Signature(s) Signed: 10/05/2021 8:06:48 AM By: Duanne Guessannon, Braya Habermehl MD FACS Previous Signature: 10/02/2021 12:59:09 PM Version By: Duanne Guessannon, Godfrey Tritschler MD FACS Entered By: Duanne Guessannon, Lylie Blacklock on 10/05/2021 08:06:47 -------------------------------------------------------------------------------- HxROS Details Patient Name: Date of Service: [name] Acquanetta SitYISHIMYE, [name] 10/02/2021 9:30 A M Medical Record Number: [id number] Patient Account Number: [id number] Date of Birth/Sex:  Treating RN: 07/01/1976 (46 y.o. M) Primary Care Provider: [name] Other Clinician: Referring Provider: Treating Provider/Extender: Horton Marshall in Treatment: 72 Information Obtained From Patient Gastrointestinal Medical History: Past Medical History Notes: Chronic Gastritis Immunizations Pneumococcal Vaccine: Received Pneumococcal Vaccination: No Implantable Devices No devices added Family and Social History Unknown History: Yes; Current every day smoker; Marital Status - Married; Alcohol Use: Rarely; Drug Use: No History; Caffeine Use: Moderate; Financial Concerns: No; Food, Clothing or Shelter Needs: No; Support System Lacking: No; Transportation Concerns: No Physician Affirmation I have reviewed and agree with the above information. Electronic Signature(s) Signed: 10/02/2021 1:01:18 PM By: [name] FACS Entered By: Duanne Guess on 10/02/2021 12:49:10 -------------------------------------------------------------------------------- SuperBill Details Patient Name: Date of Service: [name] 10/02/2021 Medical Record Number:  [id number] Patient Account Number: [id number] Date of Birth/Sex: Treating RN: 21-Sep-1975 (46 y.o. M) Primary Care Provider: [name] Other Clinician: Referring Provider: Treating Provider/Extender: Horton Marshall in Treatment: 34 Diagnosis Coding ICD-10 Codes Code Description [phone] Non-pressure chronic ulcer of left ankle with other specified severity I87.332 Chronic venous hypertension (idiopathic) with ulcer and inflammation of left lower extremity Facility Procedures CPT4 Code: 01027253 Description: Q4121- Theraskin per 1sq cm Large -39 sq cm Modifier: Quantity: 13 CPT4 Code: [id number] Description: 15271 - SKIN SUB GRAFT TRNK/ARM/LEG ICD-10 Diagnosis Description L97.328 Non-pressure chronic ulcer of left ankle with other specified severity Modifier: Quantity: 1 Physician Procedures : CPT4 Code Description Modifier [id number] 15271 - WC PHYS SKIN SUB GRAFT TRNK/ARM/LEG ICD-10 Diagnosis Description L97.328 Non-pressure chronic ulcer of left ankle with other specified severity Quantity: 1 Electronic Signature(s) Signed: 10/02/2021 4:08:25 PM By: [name] FACS Signed: 10/02/2021 4:40:23 PM By: Karie Schwalbe RN Previous Signature: 10/02/2021 1:00:58 PM Version By: [name] FACS Entered By: Karie Schwalbe on 10/02/2021 15:51:20

## 2021-10-09 ENCOUNTER — Other Ambulatory Visit: Payer: Self-pay

## 2021-10-09 ENCOUNTER — Encounter (HOSPITAL_BASED_OUTPATIENT_CLINIC_OR_DEPARTMENT_OTHER): Payer: Medicaid Other | Admitting: General Surgery

## 2021-10-09 DIAGNOSIS — L97328 Non-pressure chronic ulcer of left ankle with other specified severity: Secondary | ICD-10-CM | POA: Diagnosis not present

## 2021-10-09 DIAGNOSIS — I87332 Chronic venous hypertension (idiopathic) with ulcer and inflammation of left lower extremity: Secondary | ICD-10-CM | POA: Diagnosis not present

## 2021-10-09 DIAGNOSIS — L97322 Non-pressure chronic ulcer of left ankle with fat layer exposed: Secondary | ICD-10-CM | POA: Diagnosis not present

## 2021-10-09 DIAGNOSIS — F17200 Nicotine dependence, unspecified, uncomplicated: Secondary | ICD-10-CM | POA: Diagnosis not present

## 2021-10-09 NOTE — Progress Notes (Signed)
Stripling, [name] ([id number]) ?Visit Report for 10/09/2021 ?Arrival Information Details ?Patient Name: Date of Service: ?[name], [name] 10/09/2021 9:30 [name] M ?Medical Record Number: [id number] ?Patient Account Number: [id number] ?Date of Birth/Sex: Treating RN: ?09-May-1976 (46 y.o. [name] ?Primary Care Kanoa Phillippi: [name] Other Clinician: ?Referring Trust Leh: ?Treating Takelia Urieta/Extender: Duanne Guess ?[name] ?Weeks in Treatment: 35 ?Visit Information History Since Last Visit ?Added or deleted any medications: No ?Patient Arrived: Ambulatory ?Any new allergies or adverse reactions: No ?Arrival Time: 09:50 ?Had [name] fall or experienced change in No ?Accompanied By: alone ?activities of daily living that may affect ?Transfer Assistance: None ?risk of falls: ?Patient Identification Verified: Yes ?Signs or symptoms of abuse/neglect since last visito No ?Secondary Verification Process Completed: Yes ?Hospitalized since last visit: No ?Patient Requires Transmission-Based Precautions: No ?Implantable device outside of the clinic excluding No ?Patient Has Alerts: No ?cellular tissue based products placed in the center ?since last visit: ?Has Dressing in Place as Prescribed: Yes ?Has Compression in Place as Prescribed: Yes ?Pain Present Now: No ?Electronic Signature(s) ?Signed: 10/09/2021 1:38:56 PM By: Zandra Abts RN, BSN ?Entered By: Zandra Abts on 10/09/2021 09:50:36 ?-------------------------------------------------------------------------------- ?Compression Therapy Details ?Patient Name: Date of Service: ?[name], [name] 10/09/2021 9:30 [name] M ?Medical Record Number: [id number] ?Patient Account Number: [id number] ?Date of Birth/Sex: Treating RN: ?31-Dec-1975 (46 y.o. [name] ?Primary Care Dare Spillman: [name] Other Clinician: ?Referring Ryken Paschal: ?Treating Logan Vegh/Extender: Duanne Guess ?[name] ?Weeks in Treatment: 35 ?Compression Therapy Performed for Wound  Assessment: Wound #1 Left Ankle ?Performed By: Clinician Zandra Abts, RN ?Compression Type: Three Layer ?Electronic Signature(s) ?Signed: 10/09/2021 1:38:56 PM By: Zandra Abts RN, BSN ?Entered By: Zandra Abts on 10/09/2021 09:51:52 ?-------------------------------------------------------------------------------- ?Encounter Discharge Information Details ?Patient Name: Date of Service: ?[name], [name] 10/09/2021 9:30 [name] M ?Medical Record Number: [id number] ?Patient Account Number: [id number] ?Date of Birth/Sex: ?Treating RN: ?[birth year] (46 y.o. [name] ?Primary Care Francetta Ilg: [name] ?Other Clinician: ?Referring Errika Narvaiz: ?Treating Emberlie Gotcher/Extender: Duanne Guess ?[name] ?Weeks in Treatment: 35 ?Encounter Discharge Information Items ?Discharge Condition: Stable ?Ambulatory Status: Ambulatory ?Discharge Destination: Home ?Transportation: Private Auto ?Accompanied By: alone ?Schedule Follow-up Appointment: Yes ?Clinical Summary of Care: Patient Declined ?Electronic Signature(s) ?Signed: 10/09/2021 1:38:56 PM By: Zandra Abts RN, BSN ?Entered By: Zandra Abts on 10/09/2021 09:59:36 ?-------------------------------------------------------------------------------- ?Wound Assessment Details ?Patient Name: ?Date of Service: ?[name], [name] 10/09/2021 9:30 [name] M ?Medical Record Number: [id number] ?Patient Account Number: [id number] ?Date of Birth/Sex: ?Treating RN: ?26-Jul-1976 (46 y.o. [name] ?Primary Care Birdell Frasier: [name] ?Other Clinician: ?Referring Sundae Maners: ?Treating Beverely Suen/Extender: Duanne Guess ?[name] ?Weeks in Treatment: 35 ?Wound Status ?Wound Number: 1 ?Primary Etiology: Trauma, Other ?Wound Location: Left Ankle ?Wound Status: Open ?Wounding Event: Trauma ?Date Acquired: 10/14/2020 ?Weeks Of Treatment: 35 ?Clustered Wound: No ?Wound Measurements ?Length: (cm) 2.2 ?Width: (cm) 0.4 ?Depth: (cm) 0.2 ?Area: (cm?) 0.691 ?Volume: (cm?) 0.138 ?%  Reduction in Area: 98.9% ?% Reduction in Volume: 99.4% ?Epithelialization: Small (1-33%) ?Tunneling: No ?Undermining: No ?Wound Description ?Classification: Full Thickness Without Exposed Support Structures ?Wound Margin: Flat and Intact ?Exudate Amount: Medium ?Exudate Type: Serosanguineous ?Exudate Color: red, brown ?Foul Odor After Cleansing: No ?Slough/Fibrino Yes ?Wound Bed ?Granulation Amount: Large (67-100%) Exposed Structure ?Granulation Quality: Red, Pink ?Fascia Exposed: No ?Necrotic Amount: Small (1-33%) ?Fat Layer (Subcutaneous Tissue) Exposed: Yes ?Necrotic Quality: Adherent Slough ?Tendon Exposed: No ?Muscle Exposed: No ?Joint Exposed: No ?Bone Exposed: No ?Treatment Notes ?Wound #1 (Ankle) Wound Laterality: Left ?Cleanser ?Soap and  Water ?Discharge Instruction: May shower and wash wound with dial antibacterial soap and water prior to dressing change. ?Wound Cleanser ?Discharge Instruction: Cleanse the wound with wound cleanser prior to applying [name] clean dressing using gauze sponges, not tissue or cotton ?balls. ?Peri-Wound Care ?Triamcinolone 15 (g) ?Discharge Instruction: T wound and leg ?o ?Zinc Oxide Ointment 30g tube ?Discharge Instruction: Apply Zinc Oxide to periwound with each dressing change ?Sween Lotion (Moisturizing lotion) ?Discharge Instruction: Apply moisturizing lotion as directed ?Topical ?Gentamicin ?Discharge Instruction: Apply to wound that is not covered with Theraskin ?Primary Dressing ?KerraCel Ag Gelling Fiber Dressing, 4x5 in (silver alginate) ?Discharge Instruction: apply to area not covered by Theraskin ?Secondary Dressing ?ABD Pad, 8x10 ?Discharge Instruction: Apply over primary dressing as directed. ?CarboFLEX Odor Control Dressing, 4x4 in ?Discharge Instruction: Apply over primary dressing as directed. ?Secured With ?Compression Wrap ?ThreePress (3 layer compression wrap) ?Discharge Instruction: Apply three layer compression as directed. ?Compression  Stockings ?Add-Ons ?Electronic Signature(s) ?Signed: 10/09/2021 1:38:56 PM By: Zandra Abts RN, BSN ?Entered By: Zandra Abts on 10/09/2021 09:51:32 ?-------------------------------------------------------------------------------- ?Vitals Details ?Patient Name: ?Date of Service: ?[name], [name] 10/09/2021 9:30 [name] M ?Medical Record Number: [id number] ?Patient Account Number: [id number] ?Date of Birth/Sex: ?Treating RN: ?11/06/1975 (46 y.o. [name] ?Primary Care Bronislaw Switzer: [name] ?Other Clinician: ?Referring Varick Keys: ?Treating Ayane Delancey/Extender: Duanne Guess ?[name] ?Weeks in Treatment: 35 ?Vital Signs ?Time Taken: 09:50 ?Temperature (??F): 97.9 ?Pulse (bpm): 87 ?Respiratory Rate (breaths/min): 18 ?Blood Pressure (mmHg): 121/82 ?Reference Range: 80 - 120 mg / dl ?Electronic Signature(s) ?Signed: 10/09/2021 1:38:56 PM By: Zandra Abts RN, BSN ?Entered By: Zandra Abts on 10/09/2021 09:50:53 ?

## 2021-10-12 NOTE — Progress Notes (Signed)
[name], [name] ([id number]) ?Visit Report for 10/09/2021 ?SuperBill Details ?Patient Name: Date of Service: ?[name], [name] 10/09/2021 ?Medical Record Number: [id number] ?Patient Account Number: [id number] ?Date of Birth/Sex: Treating RN: ?[birth year] (46 y.o. [name] ?Primary Care Provider: Wells Guiles Other Clinician: ?Referring Provider: ?Treating Provider/Extender: [name] ?Wells Guiles ?Weeks in Treatment: 35 ?Diagnosis Coding ?ICD-10 Codes ?Code Description ?L97.328 Non-pressure chronic ulcer of left ankle with other specified severity ?V070573 Chronic venous hypertension (idiopathic) with ulcer and inflammation of left lower extremity ?Facility Procedures ?CPT4 Code Description Modifier Quantity ?[id number] (Facility Use Only) [phone] - APPLY MULTLAY COMPRS LWR LT LEG 1 ?Electronic Signature(s) ?Signed: 10/09/2021 1:38:56 PM By: Levan Hurst RN, BSN ?Signed: 10/12/2021 7:34:41 AM By: [name] MD FACS ?Entered By: Levan Hurst on 10/09/2021 09:59:54 ?

## 2021-10-16 ENCOUNTER — Encounter (HOSPITAL_BASED_OUTPATIENT_CLINIC_OR_DEPARTMENT_OTHER): Payer: Medicaid Other | Admitting: General Surgery

## 2021-10-16 ENCOUNTER — Other Ambulatory Visit: Payer: Self-pay

## 2021-10-16 DIAGNOSIS — F17200 Nicotine dependence, unspecified, uncomplicated: Secondary | ICD-10-CM | POA: Diagnosis not present

## 2021-10-16 DIAGNOSIS — L97322 Non-pressure chronic ulcer of left ankle with fat layer exposed: Secondary | ICD-10-CM | POA: Diagnosis not present

## 2021-10-16 DIAGNOSIS — L97328 Non-pressure chronic ulcer of left ankle with other specified severity: Secondary | ICD-10-CM | POA: Diagnosis not present

## 2021-10-16 DIAGNOSIS — I87332 Chronic venous hypertension (idiopathic) with ulcer and inflammation of left lower extremity: Secondary | ICD-10-CM | POA: Diagnosis not present
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
## 2021-10-16 NOTE — Progress Notes (Signed)
Soledad, [name] ([id number]) ?Visit Report for 10/16/2021 ?Arrival Information Details ?Patient Name: Date of Service: ?[name], [name] 10/16/2021 9:30 [name] M ?Medical Record Number: [id number] ?Patient Account Number: [id number] ?Date of Birth/Sex: Treating RN: ?1976-03-01 (46 y.o. Judie Petit) Karie Schwalbe ?Primary Care Banyan Goodchild: [name] Other Clinician: ?Referring Odies Desa: ?Treating Suvan Stcyr/Extender: Duanne Guess ?[name] ?Weeks in Treatment: 36 ?Visit Information History Since Last Visit ?Added or deleted any medications: No ?Patient Arrived: Ambulatory ?Any new allergies or adverse reactions: No ?Arrival Time: 09:48 ?Had [name] fall or experienced change in No ?Accompanied By: Cheron Every ?activities of daily living that may affect ?Transfer Assistance: None ?risk of falls: ?Patient Requires Transmission-Based Precautions: No ?Signs or symptoms of abuse/neglect since last visito No ?Patient Has Alerts: No ?Hospitalized since last visit: No ?Implantable device outside of the clinic excluding No ?cellular tissue based products placed in the center ?since last visit: ?Has Dressing in Place as Prescribed: Yes ?Has Compression in Place as Prescribed: Yes ?Pain Present Now: No ?Electronic Signature(s) ?Signed: 10/16/2021 1:40:27 PM By: Karie Schwalbe RN ?Entered By: Karie Schwalbe on 10/16/2021 09:49:39 ?-------------------------------------------------------------------------------- ?Compression Therapy Details ?Patient Name: Date of Service: ?[name], [name] 10/16/2021 9:30 [name] M ?Medical Record Number: [id number] ?Patient Account Number: [id number] ?Date of Birth/Sex: Treating RN: ?Jul 26, 1976 (46 y.o. Judie Petit) Karie Schwalbe ?Primary Care Kanen Mottola: [name] Other Clinician: ?Referring Tykerria Mccubbins: ?Treating Yoseline Andersson/Extender: Duanne Guess ?[name] ?Weeks in Treatment: 36 ?Compression Therapy Performed for Wound Assessment: Wound #1 Left Ankle ?Performed By: Clinician Karie Schwalbe,  RN ?Compression Type: Three Layer ?Post Procedure Diagnosis ?Same as Pre-procedure ?Electronic Signature(s) ?Signed: 10/16/2021 1:40:27 PM By: Karie Schwalbe RN ?Entered By: Karie Schwalbe on 10/16/2021 10:24:12 ?-------------------------------------------------------------------------------- ?Encounter Discharge Information Details ?Patient Name: ?Date of Service: ?[name], [name] 10/16/2021 9:30 [name] M ?Medical Record Number: [id number] ?Patient Account Number: [id number] ?Date of Birth/Sex: ?Treating RN: ?[birth year] (46 y.o. Judie Petit) Karie Schwalbe ?Primary Care Electa Sterry: [name] ?Other Clinician: ?Referring Arnol Mcgibbon: ?Treating Jaree Trinka/Extender: Duanne Guess ?[name] ?Weeks in Treatment: 36 ?Encounter Discharge Information Items Post Procedure Vitals ?Discharge Condition: Stable ?Temperature (F): 99.3 ?Ambulatory Status: Ambulatory ?Pulse (bpm): 84 ?Discharge Destination: Home ?Respiratory Rate (breaths/min): 16 ?Transportation: Other ?Blood Pressure (mmHg): 114/69 ?Accompanied By: Cheron Every ?Schedule Follow-up Appointment: Yes ?Clinical Summary of Care: Patient Declined ?Electronic Signature(s) ?Signed: 10/16/2021 1:40:27 PM By: Karie Schwalbe RN ?Entered By: Karie Schwalbe on 10/16/2021 13:40:02 ?-------------------------------------------------------------------------------- ?Lower Extremity Assessment Details ?Patient Name: ?Date of Service: ?[name], [name] 10/16/2021 9:30 [name] M ?Medical Record Number: [id number] ?Patient Account Number: [id number] ?Date of Birth/Sex: ?Treating RN: ?[birth year] (46 y.o. Judie Petit) Karie Schwalbe ?Primary Care Brad Lieurance: [name] ?Other Clinician: ?Referring Alexes Lamarque: ?Treating Shantai Tiedeman/Extender: Duanne Guess ?[name] ?Weeks in Treatment: 36 ?Edema Assessment ?Assessed: [Left: No] [Right: No] ?Edema: [Left: N] [Right: o] ?Calf ?Left: Right: ?Point of Measurement: 28 cm From Medial Instep 31.3 cm ?Ankle ?Left: Right: ?Point of Measurement: 8 cm From  Medial Instep 21.8 cm ?Electronic Signature(s) ?Signed: 10/16/2021 1:40:27 PM By: Karie Schwalbe RN ?Entered By: Karie Schwalbe on 10/16/2021 09:52:39 ?-------------------------------------------------------------------------------- ?Multi Wound Chart Details ?Patient Name: ?Date of Service: ?[name], [name] 10/16/2021 9:30 [name] M ?Medical Record Number: [id number] ?Patient Account Number: [id number] ?Date of Birth/Sex: ?Treating RN: ?1976-01-17 (46 y.o. M) ?Primary Care Kashaun Bebo: [name] ?Other Clinician: ?Referring Lyzette Reinhardt: ?Treating Curby Carswell/Extender: Duanne Guess ?[name] ?Weeks in Treatment: 36 ?Vital Signs ?Height(in): ?Pulse(bpm): 84 ?Weight(lbs): ?Blood Pressure(mmHg): 114/69 ?Body Mass Index(BMI): ?Temperature(??F): 99.3 ?Respiratory Rate(breaths/min): 16 ?Photos: [[name]:[name]] ?Left Ankle [name] [name] ?Wound Location: ?  Trauma [name] [name] ?Wounding Event: ?Trauma, Other [name] [name] ?Primary Etiology: ?10/14/2020 [name] [name] ?Date Acquired: ?51 [name] [name] ?Weeks of Treatment: ?Open [name] [name] ?Wound Status: ?No [name] [name] ?Wound Recurrence: ?2.2x6.5x0.2 [name] [name] ?Measurements L x W x D (cm) ?11.231 [name] [name] ?[name] (cm?) : ?rea ?2.246 [name] [name] ?Volume (cm?) : ?81.80% [name] [name] ?% Reduction in [name] rea: ?90.90% [name] [name] ?% Reduction in Volume: ?Full Thickness Without Exposed [name] [name] ?Classification: ?Support Structures ?Medium [name] [name] ?Exudate [name] mount: ?Serosanguineous [name] [name] ?Exudate Type: ?red, brown [name] [name] ?Exudate Color: ?Flat and Intact [name] [name] ?Wound Margin: ?Medium (34-66%) [name] [name] ?Granulation [name] mount: ?Red, Pink [name] [name] ?Granulation Quality: ?Medium (34-66%) [name] [name] ?Necrotic [name] mount: ?Fat Layer (Subcutaneous Tissue): Yes [name] [name] ?Exposed Structures: ?Fascia: No ?Tendon: No ?Muscle: No ?Joint: No ?Bone: No ?Small (1-33%) [name] [name] ?Epithelialization: ?Debridement - Excisional [name] [name] ?Debridement: ?Pre-procedure Verification/Time Out 10:11 [name] [name] ?Taken: ?Other [name] [name] ?Pain Control: ?Subcutaneous, Slough [name] [name] ?Tissue  Debrided: ?Skin/Subcutaneous Tissue [name] [name] ?Level: ?14.3 [name] [name] ?Debridement [name] (sq cm): ?rea ?Curette [name] [name] ?Instrument: ?Minimum [name] [name] ?Bleeding: ?Pressure [name] [name] ?Hemostasis [name] chieved: ?0 [name] [name] ?Procedural Pain: ?0 [name] [name] ?Post Procedural Pain: ?Procedure was tolerated well [name] [name] ?Debridement Treatment Response: ?2.2x6.5x0.2 [name] [name] ?Post Debridement Measurements L x ?W x D (cm) ?2.246 [name] [name] ?Post Debridement Volume: (cm?) ?Cellular or Tissue Based Product [name] [name] ?Procedures Performed: ?Compression Therapy ?Debridement ?Treatment Notes ?Electronic Signature(s) ?Signed: 10/16/2021 10:31:26 AM By: [name] FACS ?Entered By: Duanne Guess on 10/16/2021 10:31:26 ?-------------------------------------------------------------------------------- ?Multi-Disciplinary Care Plan Details ?Patient Name: ?Date of Service: ?[name], [name] 10/16/2021 9:30 [name] M ?Medical Record Number: [id number] ?Patient Account Number: [id number] ?Date of Birth/Sex: ?Treating RN: ?June 07, 1976 (46 y.o. Judie Petit) Karie Schwalbe ?Primary Care Deandre Brannan: [name] ?Other Clinician: ?Referring Milon Dethloff: ?Treating Marylu Dudenhoeffer/Extender: Duanne Guess ?[name] ?Weeks in Treatment: 36 ?Multidisciplinary Care Plan reviewed with physician ?Active Inactive ?Wound/Skin Impairment ?Nursing Diagnoses: ?Knowledge deficit related to ulceration/compromised skin integrity ?Goals: ?Patient/caregiver will verbalize understanding of skin care regimen ?Date Initiated: 02/05/2021 ?Target Resolution Date: 11/26/2021 ?Goal Status: Active ?Interventions: ?Assess patient/caregiver ability to obtain necessary supplies ?Assess patient/caregiver ability to perform ulcer/skin care regimen upon admission and as needed ?Provide education on ulcer and skin care ?Treatment Activities: ?Skin care regimen initiated : 02/05/2021 ?Topical wound management initiated : 02/05/2021 ?Notes: ?03/31/21: Wound care regimen ongoing, target date extended. 04/21/21: Wound  care ongoing, through interpreter patient states he is doing fine with his dressing ?changes. ?Electronic Signature(s) ?Signed: 10/16/2021 1:40:27 PM By: Karie Schwalbe RN ?Entered By: Karie Schwalbe on 10/16/2021 13

## 2021-10-23 ENCOUNTER — Encounter (HOSPITAL_BASED_OUTPATIENT_CLINIC_OR_DEPARTMENT_OTHER): Payer: Medicaid Other | Admitting: General Surgery
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
## 2021-10-26 NOTE — Progress Notes (Signed)
[name] ([id number]) ?Visit Report for 10/23/2021 ?Arrival Information Details ?Patient Name: Date of Service: ?[name], [name] 10/23/2021 2:45 PM ?Medical Record Number: [id number] ?Patient Account Number: [id number] ?Date of Birth/Sex: Treating RN: ?05-06-1976 (46 y.o. [name] ?Primary Care Markon Jares: [name] Other Clinician: ?Referring Tanishi Nault: ?Treating Faaris Arizpe/Extender: Duanne Guess ?[name] ?Weeks in Treatment: 37 ?Visit Information History Since Last Visit ?Added or deleted any medications: No ?Patient Arrived: Ambulatory ?Any new allergies or adverse reactions: No ?Arrival Time: 15:00 ?Had [name] fall or experienced change in No ?Accompanied By: self ?activities of daily living that may affect ?Transfer Assistance: None ?risk of falls: ?Patient Identification Verified: Yes ?Signs or symptoms of abuse/neglect since last visito No ?Secondary Verification Process Completed: Yes ?Hospitalized since last visit: No ?Patient Requires Transmission-Based Precautions: No ?Implantable device outside of the clinic excluding No ?Patient Has Alerts: No ?cellular tissue based products placed in the center ?since last visit: ?Has Dressing in Place as Prescribed: Yes ?Has Compression in Place as Prescribed: Yes ?Pain Present Now: No ?Electronic Signature(s) ?Signed: 10/23/2021 4:25:50 PM By: [name], BSN ?Entered By: Zenaida Deed on 10/23/2021 15:02:53 ?-------------------------------------------------------------------------------- ?Compression Therapy Details ?Patient Name: Date of Service: ?[name], [name] 10/23/2021 2:45 PM ?Medical Record Number: [id number] ?Patient Account Number: [id number] ?Date of Birth/Sex: Treating RN: ?Apr 22, 1976 (46 y.o. [name] ?Primary Care Chavez Rosol: [name] Other Clinician: ?Referring Maryellen Dowdle: ?Treating Trust Leh/Extender: Duanne Guess ?[name] ?Weeks in Treatment: 37 ?Compression Therapy Performed for Wound  Assessment: Wound #1 Left Ankle ?Performed By: Clinician Zenaida Deed, RN ?Compression Type: Three Layer ?Post Procedure Diagnosis ?Same as Pre-procedure ?Electronic Signature(s) ?Signed: 10/23/2021 4:25:50 PM By: [name], BSN ?Entered By: Zenaida Deed on 10/23/2021 15:42:17 ?-------------------------------------------------------------------------------- ?Encounter Discharge Information Details ?Patient Name: ?Date of Service: ?[name], [name] 10/23/2021 2:45 PM ?Medical Record Number: [id number] ?Patient Account Number: [id number] ?Date of Birth/Sex: ?Treating RN: ?10-Apr-1976 (46 y.o. [name] ?Primary Care Toniesha Zellner: [name] ?Other Clinician: ?Referring Shondale Quinley: ?Treating Tukker Byrns/Extender: Duanne Guess ?[name] ?Weeks in Treatment: 37 ?Encounter Discharge Information Items ?Discharge Condition: Stable ?Ambulatory Status: Ambulatory ?Discharge Destination: Home ?Transportation: Private Auto ?Accompanied By: friend ?Schedule Follow-up Appointment: Yes ?Clinical Summary of Care: Patient Declined ?Electronic Signature(s) ?Signed: 10/23/2021 4:25:50 PM By: [name], BSN ?Entered By: Zenaida Deed on 10/23/2021 15:41:51 ?-------------------------------------------------------------------------------- ?Lower Extremity Assessment Details ?Patient Name: ?Date of Service: ?[name], [name] 10/23/2021 2:45 PM ?Medical Record Number: [id number] ?Patient Account Number: [id number] ?Date of Birth/Sex: ?Treating RN: ?[birth year] (46 y.o. [name] ?Primary Care Jeslie Lowe: [name] ?Other Clinician: ?Referring Atlanta Pelto: ?Treating Jadon Harbaugh/Extender: Duanne Guess ?[name] ?Weeks in Treatment: 37 ?Edema Assessment ?Assessed: [Left: No] [Right: No] ?Edema: [Left: N] [Right: o] ?Calf ?Left: Right: ?Point of Measurement: 28 cm From Medial Instep 32.5 cm ?Ankle ?Left: Right: ?Point of Measurement: 8 cm From Medial Instep 22.4 cm ?Vascular  Assessment ?Pulses: ?Dorsalis Pedis ?Palpable: [Left:Yes] ?Electronic Signature(s) ?Signed: 10/23/2021 4:25:50 PM By: [name], BSN ?Entered By: Zenaida Deed on 10/23/2021 15:20:59 ?-------------------------------------------------------------------------------- ?Multi Wound Chart Details ?Patient Name: ?Date of Service: ?[name], [name] 10/23/2021 2:45 PM ?Medical Record Number: [id number] ?Patient Account Number: [id number] ?Date of Birth/Sex: ?Treating RN: ?[birth year] (46 y.o. [name] ?Primary Care Fantashia Shupert: [name] ?Other Clinician: ?Referring Suraya Vidrine: ?Treating Jaquaya Coyle/Extender: Duanne Guess ?[name] ?Weeks in Treatment: 37 ?Vital Signs ?Height(in): ?Pulse(bpm): 75 ?Weight(lbs): ?Blood Pressure(mmHg): 112/72 ?Body Mass Index(BMI): ?Temperature(??F): 98.6 ?Respiratory Rate(breaths/min): 18 ?Photos: [1:No Photos Left Ankle] [[name]:[name] [name]] ?  Wound Location: [1:Trauma] [[name]:[name]] ?Wounding Event: [1:Trauma, Other] [[name]:[name]] ?Primary Etiology: [1:10/14/2020] [[name]:[name]] ?Date Acquired: [1:37] [[name]:[name]] ?Weeks of Treatment: [1:Open] [[name]:[name]] ?Wound Status: [1:No] [[name]:[name]] ?Wound Recurrence: [1:2.5x6.5x0.2] [[name]:[name]] ?Measurements L x W x D (cm) [1:12.763] [[name]:[name]] ?[name] (cm?) : ?rea [1:2.553] [[name]:[name]] ?Volume (cm?) : [1:79.30%] [[name]:[name]] ?% Reduction in [name] rea: [1:89.60%] [[name]:[name]] ?% Reduction in Volume: [1:Full Thickness Without Exposed] [[name]:[name]] ?Classification: [1:Support Structures Medium] [[name]:[name]] ?Exudate Amount: [1:Serosanguineous] [[name]:[name]] ?Exudate Type: [1:red, brown] [[name]:[name]] ?Treatment Notes ?Electronic Signature(s) ?Signed: 10/23/2021 3:22:11 PM By: [name] FACS ?Signed: 10/23/2021 4:25:50 PM By: [name], BSN ?Entered By: Duanne Guess on 10/23/2021 15:22:11 ?-------------------------------------------------------------------------------- ?Multi-Disciplinary Care Plan Details ?Patient Name: ?Date of Service: ?[name], [name]  10/23/2021 2:45 PM ?Medical Record Number: [id number] ?Patient Account Number: [id number] ?Date of Birth/Sex: ?Treating RN: ?November 11, 1975 (46 y.o. [name] ?Primary Care Melizza Kanode: [name] ?Other Clinician: ?Referring Audie Stayer: ?Treating Nashae Maudlin/Extender: Duanne Guess ?[name] ?Weeks in Treatment: 37 ?Multidisciplinary Care Plan reviewed with physician ?Active Inactive ?Wound/Skin Impairment ?Nursing Diagnoses: ?Knowledge deficit related to ulceration/compromised skin integrity ?Goals: ?Patient/caregiver will verbalize understanding of skin care regimen ?Date Initiated: 02/05/2021 ?Target Resolution Date: 11/26/2021 ?Goal Status: Active ?Interventions: ?Assess patient/caregiver ability to obtain necessary supplies ?Assess patient/caregiver ability to perform ulcer/skin care regimen upon admission and as needed ?Provide education on ulcer and skin care ?Treatment Activities: ?Skin care regimen initiated : 02/05/2021 ?Topical wound management initiated : 02/05/2021 ?Notes: ?03/31/21: Wound care regimen ongoing, target date extended. 04/21/21: Wound care ongoing, through interpreter patient states he is doing fine with his dressing ?changes. ?Electronic Signature(s) ?Signed: 10/23/2021 4:25:50 PM By: [name], BSN ?Entered By: Zenaida Deed on 10/23/2021 15:42:29 ?-------------------------------------------------------------------------------- ?Pain Assessment Details ?Patient Name: ?Date of Service: ?[name], [name] 10/23/2021 2:45 PM ?Medical Record Number: [id number] ?Patient Account Number: [id number] ?Date of Birth/Sex: ?Treating RN: ?12/08/1975 (46 y.o. [name] ?Primary Care Vitaliy Eisenhour: [name] ?Other Clinician: ?Referring Madisen Ludvigsen: ?Treating Chynna Buerkle/Extender: Duanne Guess ?[name] ?Weeks in Treatment: 37 ?Active Problems ?Location of Pain Severity and Description of Pain ?Patient Has Paino No ?Site Locations ?Rate the pain. ?Current Pain Level: 0 ?Pain  Management and Medication ?Current Pain Management: ?Electronic Signature(s) ?Signed: 10/23/2021 4:25:50 PM By: [name], BSN ?Entered By: Zenaida Deed on 10/23/2021 15:20:11 ?--------------------------------------------

## 2021-10-30 ENCOUNTER — Encounter (HOSPITAL_BASED_OUTPATIENT_CLINIC_OR_DEPARTMENT_OTHER): Payer: Medicaid Other | Admitting: General Surgery

## 2021-10-30 DIAGNOSIS — F17200 Nicotine dependence, unspecified, uncomplicated: Secondary | ICD-10-CM | POA: Diagnosis not present

## 2021-10-30 DIAGNOSIS — L97328 Non-pressure chronic ulcer of left ankle with other specified severity: Secondary | ICD-10-CM | POA: Diagnosis not present

## 2021-10-30 DIAGNOSIS — I87332 Chronic venous hypertension (idiopathic) with ulcer and inflammation of left lower extremity: Secondary | ICD-10-CM | POA: Diagnosis not present

## 2021-10-30 DIAGNOSIS — L97322 Non-pressure chronic ulcer of left ankle with fat layer exposed: Secondary | ICD-10-CM | POA: Diagnosis not present
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
## 2021-10-30 NOTE — Progress Notes (Signed)
[name], [name] ([name]) ?Visit Report for 10/30/2021 ?Arrival Information Details ?Patient Name: Date of Service: ?[name], [name] 10/30/2021 10:00 [name] M ?Medical Record Number: [name] ?Patient Account Number: [id number] ?Date of Birth/Sex: Treating RN: ?[birth year] (46 y.o. M) ?Primary Care [name]: [name] Other Clinician: ?Referring [name]: ?Treating [name]/Extender: [name] ?[name] ?Weeks in Treatment: 38 ?Visit Information History Since Last Visit ?Added or deleted any medications: No ?Patient Arrived: Ambulatory ?Any new allergies or adverse reactions: No ?Arrival Time: 10:26 ?Had [name] fall or experienced change in No ?Accompanied By: translator ?activities of daily living that may affect ?Transfer Assistance: None ?risk of falls: ?Patient Identification Verified: Yes ?Signs or symptoms of abuse/neglect since last visito No ?Secondary Verification Process Completed: Yes ?Hospitalized since last visit: No ?Patient Requires Transmission-Based Precautions: No ?Implantable device outside of the clinic excluding No ?Patient Has Alerts: No ?cellular tissue based products placed in the center ?since last visit: ?Has Dressing in Place as Prescribed: Yes ?Pain Present Now: No ?Electronic Signature(s) ?Signed: 10/30/2021 1:14:45 PM By: Sandre Kitty ?Entered By: Sandre Kitty on 10/30/2021 10:26:36 ?-------------------------------------------------------------------------------- ?Compression Therapy Details ?Patient Name: Date of Service: ?[name], [name] 10/30/2021 10:00 [name] M ?Medical Record Number: [name] ?Patient Account Number: [id number] ?Date of Birth/Sex: Treating RN: ?[birth year] (46 y.o. [name] ?Primary Care [name]: [name] Other Clinician: ?Referring Pat Sires: ?Treating [name]/Extender: [name] ?[name] ?Weeks in Treatment: 38 ?Compression Therapy Performed for Wound Assessment: Wound #1 Left Ankle ?Performed By: Clinician  [name] Hurst, RN ?Compression Type: Three Layer ?Post Procedure Diagnosis ?Same as Pre-procedure ?Electronic Signature(s) ?Signed: 10/30/2021 4:27:25 PM By: [name] Hurst RN, BSN ?Entered By: [name] on 10/30/2021 11:03:34 ?-------------------------------------------------------------------------------- ?Encounter Discharge Information Details ?Patient Name: ?Date of Service: ?[name], [name] 10/30/2021 10:00 [name] M ?Medical Record Number: [name] ?Patient Account Number: [id number] ?Date of Birth/Sex: ?Treating RN: ?[birth year] (46 y.o. [name] ?Primary Care [name]: [name] ?Other Clinician: ?Referring [name]: ?Treating [name]/Extender: [name] ?[name] ?Weeks in Treatment: 38 ?Encounter Discharge Information Items Post Procedure Vitals ?Discharge Condition: Stable ?Temperature (F): 97.8 ?Ambulatory Status: Ambulatory ?Pulse (bpm): 81 ?Discharge Destination: Home ?Respiratory Rate (breaths/min): 18 ?Transportation: Private Auto ?Blood Pressure (mmHg): 122/74 ?Accompanied By: interpreter ?Schedule Follow-up Appointment: Yes ?Clinical Summary of Care: Patient Declined ?Electronic Signature(s) ?Signed: 10/30/2021 4:27:25 PM By: [name] Hurst RN, BSN ?Entered By: [name] on 10/30/2021 15:47:10 ?-------------------------------------------------------------------------------- ?Lower Extremity Assessment Details ?Patient Name: ?Date of Service: ?[name], [name] 10/30/2021 10:00 [name] M ?Medical Record Number: [name] ?Patient Account Number: [id number] ?Date of Birth/Sex: ?Treating RN: ?05/02/1976 (46 y.o. [name] ?Primary Care [name]: [name] ?Other Clinician: ?Referring Kearia Yin: ?Treating [name]/Extender: [name] ?[name] ?Weeks in Treatment: 38 ?Edema Assessment ?Assessed: [Left: No] [Right: No] ?Edema: [Left: N] [Right: o] ?Calf ?Left: Right: ?Point of Measurement: 28 cm From Medial Instep 32.5 cm ?Ankle ?Left: Right: ?Point  of Measurement: 8 cm From Medial Instep 22 cm ?Vascular Assessment ?Pulses: ?Dorsalis Pedis ?Palpable: [Left:Yes] ?Electronic Signature(s) ?Signed: 10/30/2021 4:27:25 PM By: [name] Hurst RN, BSN ?Entered By: [name] on 10/30/2021 10:51:42 ?-------------------------------------------------------------------------------- ?Multi Wound Chart Details ?Patient Name: ?Date of Service: ?[name], [name] 10/30/2021 10:00 [name] M ?Medical Record Number: [name] ?Patient Account Number: [id number] ?Date of Birth/Sex: ?Treating RN: ?03/25/1976 (46 y.o. M) ?Primary Care [name]: [name] ?Other Clinician: ?Referring Hemi Chacko: ?Treating [name]/Extender: [name] ?[name] ?Weeks in Treatment: 38 ?Vital Signs ?Height(in): ?Pulse(bpm): 81 ?Weight(lbs): ?Blood Pressure(mmHg): 122/74 ?Body Mass Index(BMI): ?Temperature(??F): 97.8 ?Respiratory  Rate(breaths/min): 18 ?Photos: [[name]:[name]] ?Left Ankle [name] [name] ?Wound Location: ?Trauma [name] [name] ?Wounding Event: ?Trauma, Other [name] [name] ?Primary Etiology: ?10/14/2020 [name] [name] ?Date Acquired: ?[birth year] [name] [name] ?Weeks of Treatment: ?Open [name] [name] ?Wound Status: ?No [name] [name] ?Wound Recurrence: ?2.5x6.5x0.2 [name] [name] ?Measurements L x W x D (cm) ?12.763 [name] [name] ?[name] (cm?) : ?rea ?2.553 [name] [name] ?Volume (cm?) : ?79.30% [name] [name] ?% Reduction in [name] rea: ?89.60% [name] [name] ?% Reduction in Volume: ?Full Thickness Without Exposed [name] [name] ?Classification: ?Support Structures ?Large [name] [name] ?Exudate [name] mount: ?Serosanguineous [name] [name] ?Exudate Type: ?red, brown [name] [name] ?Exudate Color: ?Flat and Intact [name] [name] ?Wound Margin: ?Medium (34-66%) [name] [name] ?Granulation [name] mount: ?Red [name] [name] ?Granulation Quality: ?Medium (34-66%) [name] [name] ?Necrotic [name] mount: ?Fat Layer (Subcutaneous Tissue): Yes [name] [name] ?Exposed Structures: ?Fascia: No ?Tendon: No ?Muscle: No ?Joint: No ?Bone: No ?Small (1-33%) [name] [name] ?Epithelialization: ?Debridement - Selective/Open Wound [name] [name] ?Debridement: ?Pre-procedure  Verification/Time Out 10:57 [name] [name] ?Taken: ?[facility] [name] [name] ?Tissue Debrided: ?Skin/Epidermis [name] [name] ?Level: ?16.25 [name] [name] ?Debridement [name] (sq cm): ?rea ?Curette, Scissors [name] [name] ?Instrument: ?Minimum [name] [name] ?Bleeding: ?Pressure [name] [name] ?Hemostasis [name] chieved: ?3 [name] [name] ?Procedural Pain: ?0 [name] [name] ?Post Procedural Pain: ?Procedure was tolerated well [name] [name] ?Debridement Treatment Response: ?2.5x6.5x0.2 [name] [name] ?Post Debridement Measurements L x ?W x D (cm) ?2.553 [name] [name] ?Post Debridement Volume: (cm?) ?Compression Therapy [name] [name] ?Procedures Performed: ?Debridement ?Treatment Notes ?Electronic Signature(s) ?Signed: 10/30/2021 11:06:55 AM By: [name] Maudlin MD FACS ?Entered By: [name] on 10/30/2021 11:06:55 ?-------------------------------------------------------------------------------- ?Multi-Disciplinary Care Plan Details ?Patient Name: ?Date of Service: ?[name], [name] 10/30/2021 10:00 [name] M ?Medical Record Number: [id number] ?Patient Account Number: [id number] ?Date of Birth/Sex: ?Treating RN: ?1976-07-03 (46 y.o. [name] ?Primary Care Joyanne Eddinger: [name] ?Other Clinician: ?Referring Ajanay Farve: ?Treating Cyan Clippinger/Extender: [name] ?[name] ?Weeks in Treatment: 38 ?Multidisciplinary Care Plan reviewed with physician ?Active Inactive ?Wound/Skin Impairment ?Nursing Diagnoses: ?Knowledge deficit related to ulceration/compromised skin integrity ?Goals: ?Patient/caregiver will verbalize understanding of skin care regimen ?Date Initiated: 02/05/2021 ?Target Resolution Date: 11/26/2021 ?Goal Status: Active ?Interventions: ?Assess patient/caregiver ability to obtain necessary supplies ?Assess patient/caregiver ability to perform ulcer/skin care regimen upon admission and as needed ?Provide education on ulcer and skin care ?Treatment Activities: ?Skin care regimen initiated : 02/05/2021 ?Topical wound management initiated : 02/05/2021 ?Notes: ?03/31/21: Wound care regimen ongoing,  target date extended. 04/21/21: Wound care ongoing, through interpreter patient states he is doing fine with his dressing ?changes. ?Electronic Signature(s) ?Signed: 10/30/2021 4:27:25 PM By: [name] Hurst RN, BSN ?Entered

## 2021-10-31 DIAGNOSIS — Z419 Encounter for procedure for purposes other than remedying health state, unspecified: Secondary | ICD-10-CM | POA: Diagnosis not present

## 2021-11-04 LAB — AEROBIC/ANAEROBIC CULTURE W GRAM STAIN (SURGICAL/DEEP WOUND): Gram Stain: NONE SEEN

## 2021-11-06 ENCOUNTER — Encounter (HOSPITAL_BASED_OUTPATIENT_CLINIC_OR_DEPARTMENT_OTHER): Payer: Medicaid Other | Attending: General Surgery | Admitting: General Surgery

## 2021-11-06 DIAGNOSIS — I87332 Chronic venous hypertension (idiopathic) with ulcer and inflammation of left lower extremity: Secondary | ICD-10-CM | POA: Diagnosis not present

## 2021-11-06 DIAGNOSIS — L97328 Non-pressure chronic ulcer of left ankle with other specified severity: Secondary | ICD-10-CM | POA: Insufficient documentation

## 2021-11-06 DIAGNOSIS — F17200 Nicotine dependence, unspecified, uncomplicated: Secondary | ICD-10-CM | POA: Diagnosis not present

## 2021-11-06 NOTE — Progress Notes (Signed)
[name], [name] ([id number]) ?Visit Report for 11/06/2021 ?Arrival Information Details ?Patient Name: Date of Service: ?[name], [name] 11/06/2021 10:15 [name] M ?Medical Record Number: [id number] ?Patient Account Number: [id number] ?Date of Birth/Sex: Treating RN: ?March 03, 1976 (46 y.o. [name]) Dellie Catholic ?Primary Care Aaro Meyers: Wells Guiles Other Clinician: ?Referring Tyreon Frigon: ?Treating Morena Mckissack/Extender: [name] ?Wells Guiles ?Weeks in Treatment: 39 ?Visit Information History Since Last Visit ?Added or deleted any medications: No ?Patient Arrived: Ambulatory ?Any new allergies or adverse reactions: No ?Arrival Time: 10:39 ?Had [name] fall or experienced change in No ?Accompanied By: translator ?activities of daily living that may affect ?Transfer Assistance: None ?risk of falls: ?Patient Identification Verified: Yes ?Signs or symptoms of abuse/neglect since last visito No ?Secondary Verification Process Completed: Yes ?Hospitalized since last visit: No ?Patient Requires Transmission-Based Precautions: No ?Implantable device outside of the clinic excluding No ?Patient Has Alerts: No ?cellular tissue based products placed in the center ?since last visit: ?Has Dressing in Place as Prescribed: Yes ?Pain Present Now: No ?Electronic Signature(s) ?Signed: 11/06/2021 2:37:03 PM By: Sandre Kitty ?Entered By: Sandre Kitty on 11/06/2021 10:42:28 ?-------------------------------------------------------------------------------- ?Compression Therapy Details ?Patient Name: Date of Service: ?[name], [name] 11/06/2021 10:15 [name] M ?Medical Record Number: [id number] ?Patient Account Number: [id number] ?Date of Birth/Sex: Treating RN: ?[birth year] (46 y.o. [name] ?Primary Care [name]: Wells Guiles Other Clinician: ?Referring [name]: ?Treating [name]/Extender: [name] ?Wells Guiles ?Weeks in Treatment: 39 ?Compression Therapy Performed for Wound Assessment: Wound #1 Left Ankle ?Performed By:  Clinician Baruch Gouty, RN ?Compression Type: Three Layer ?Post Procedure Diagnosis ?Same as Pre-procedure ?Electronic Signature(s) ?Signed: 11/06/2021 5:30:13 PM By: Baruch Gouty RN, BSN ?Entered By: Baruch Gouty on 11/06/2021 11:25:05 ?-------------------------------------------------------------------------------- ?Encounter Discharge Information Details ?Patient Name: ?Date of Service: ?[name], [name] 11/06/2021 10:15 [name] M ?Medical Record Number: [id number] ?Patient Account Number: [id number] ?Date of Birth/Sex: ?Treating RN: ?Dec 09, 1975 (46 y.o. [name] ?Primary Care [name]: Wells Guiles ?Other Clinician: ?Referring [name]: ?Treating [name]/Extender: [name] ?Wells Guiles ?Weeks in Treatment: 39 ?Encounter Discharge Information Items Post Procedure Vitals ?Discharge Condition: Stable ?Temperature (F): 97.9 ?Ambulatory Status: Ambulatory ?Pulse (bpm): 74 ?Discharge Destination: Home ?Respiratory Rate (breaths/min): 18 ?Transportation: Private Auto ?Blood Pressure (mmHg): 111/74 ?Accompanied By: interpreter ?Schedule Follow-up Appointment: Yes ?Clinical Summary of Care: Patient Declined ?Electronic Signature(s) ?Signed: 11/06/2021 5:30:13 PM By: Baruch Gouty RN, BSN ?Entered By: Baruch Gouty on 11/06/2021 11:48:53 ?-------------------------------------------------------------------------------- ?Lower Extremity Assessment Details ?Patient Name: ?Date of Service: ?[name], [name] 11/06/2021 10:15 [name] M ?Medical Record Number: [id number] ?Patient Account Number: [id number] ?Date of Birth/Sex: ?Treating RN: ?27-Feb-1976 (46 y.o. [name] ?Primary Care [name]: Wells Guiles ?Other Clinician: ?Referring [name]: ?Treating [name]/Extender: [name] ?Wells Guiles ?Weeks in Treatment: 39 ?Edema Assessment ?Assessed: [Left: No] [Right: No] ?Edema: [Left: Ye] [Right: s] ?Calf ?Left: Right: ?Point of Measurement: 28 cm From Medial Instep 32.5 cm ?Ankle ?Left:  Right: ?Point of Measurement: 8 cm From Medial Instep 22.5 cm ?Vascular Assessment ?Pulses: ?Dorsalis Pedis ?Palpable: [Left:Yes] ?Electronic Signature(s) ?Signed: 11/06/2021 5:30:13 PM By: Baruch Gouty RN, BSN ?Entered By: Baruch Gouty on 11/06/2021 11:15:52 ?-------------------------------------------------------------------------------- ?Multi Wound Chart Details ?Patient Name: ?Date of Service: ?[name], [name] 11/06/2021 10:15 [name] M ?Medical Record Number: [id number] ?Patient Account Number: [id number] ?Date of Birth/Sex: ?Treating RN: ?1975-08-07 (46 y.o. [name]) Dellie Catholic ?Primary Care [name]: Wells Guiles ?Other Clinician: ?Referring [name]: ?Treating [name]/Extender: [name] ?Wells Guiles ?Weeks in Treatment: 39 ?Vital Signs ?Height(in): ?Pulse(bpm): 74 ?Weight(lbs): ?Blood Pressure(mmHg): 111/74 ?Body Mass  Index(BMI): ?Temperature(??F): 97.9 ?Respiratory Rate(breaths/min): 18 ?Photos: [[name]:[name]] ?Left Ankle [name] [name] ?Wound Location: ?Trauma [name] [name] ?Wounding Event: ?Trauma, Other [name] [name] ?Primary Etiology: ?10/14/2020 [name] [name] ?Date Acquired: ?56 [name] [name] ?Weeks of Treatment: ?Open [name] [name] ?Wound Status: ?No [name] [name] ?Wound Recurrence: ?2.2x6.2x0.2 [name] [name] ?Measurements L x W x D (cm) ?10.713 [name] [name] ?[name] (cm?) : ?rea ?2.143 [name] [name] ?Volume (cm?) : ?82.60% [name] [name] ?% Reduction in [name] rea: ?91.30% [name] [name] ?% Reduction in Volume: ?Full Thickness Without Exposed [name] [name] ?Classification: ?Support Structures ?Medium [name] [name] ?Exudate [name] mount: ?Serosanguineous [name] [name] ?Exudate Type: ?red, brown [name] [name] ?Exudate Color: ?Flat and Intact [name] [name] ?Wound Margin: ?Medium (34-66%) [name] [name] ?Granulation [name] mount: ?Red [name] [name] ?Granulation Quality: ?Medium (34-66%) [name] [name] ?Necrotic [name] mount: ?Fat Layer (Subcutaneous Tissue): Yes [name] [name] ?Exposed Structures: ?Fascia: No ?Tendon: No ?Muscle: No ?Joint: No ?Bone: No ?Small (1-33%) [name] [name] ?Epithelialization: ?Debridement - Excisional [name]  [name] ?Debridement: ?Pre-procedure Verification/Time Out 11:20 [name] [name] ?Taken: ?Other [name] [name] ?Pain Control: ?Subcutaneous, Slough [name] [name] ?Tissue Debrided: ?Skin/Subcutaneous Tissue [name] [name] ?Level: ?13.64 [name] [name] ?Debridement [name] (sq cm): ?rea ?Curette [name] [name] ?Instrument: ?Minimum [name] [name] ?Bleeding: ?Pressure [name] [name] ?Hemostasis [name] chieved: ?3 [name] [name] ?Procedural Pain: ?1 [name] [name] ?Post Procedural Pain: ?Procedure was tolerated well [name] [name] ?Debridement Treatment Response: ?2.2x6.2x0.2 [name] [name] ?Post Debridement Measurements L x ?W x D (cm) ?2.143 [name] [name] ?Post Debridement Volume: (cm?) ?Compression Therapy [name] [name] ?Procedures Performed: ?Debridement ?Treatment Notes ?Electronic Signature(s) ?Signed: 11/06/2021 11:34:35 AM By: [name] MD FACS ?Signed: 11/06/2021 5:21:43 PM By: Dellie Catholic RN ?Entered By: [name] on 11/06/2021 11:34:35 ?-------------------------------------------------------------------------------- ?Multi-Disciplinary Care Plan Details ?Patient Name: ?Date of Service: ?[name], [name] 11/06/2021 10:15 [name] M ?Medical Record Number: [id number] ?Patient Account Number: [id number] ?Date of Birth/Sex: ?Treating RN: ?1976/07/20 (46 y.o. [name] ?Primary Care Lynnzie Blackson: Wells Guiles ?Other Clinician: ?Referring Justine Cossin: ?Treating Amber Guthridge/Extender: [name] ?Wells Guiles ?Weeks in Treatment: 39 ?Multidisciplinary Care Plan reviewed with physician ?Active Inactive ?Wound/Skin Impairment ?Nursing Diagnoses: ?Knowledge deficit related to ulceration/compromised skin integrity ?Goals: ?Patient/caregiver will verbalize understanding of skin care regimen ?Date Initiated: 02/05/2021 ?Target Resolution Date: 11/26/2021 ?Goal Status: Active ?Interventions: ?Assess patient/caregiver ability to obtain necessary supplies ?Assess patient/caregiver ability to perform ulcer/skin care regimen upon admission and as needed ?Provide education on ulcer and skin care ?Treatment Activities: ?Skin  care regimen initiated : 02/05/2021 ?Topical wound management initiated : 02/05/2021 ?Notes: ?03/31/21: Wound care regimen ongoing, target date extended. 04/21/21: Wound care ongoing, through interpreter patient states he is doing fi
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
## 2021-11-13 ENCOUNTER — Encounter (HOSPITAL_BASED_OUTPATIENT_CLINIC_OR_DEPARTMENT_OTHER): Payer: Medicaid Other | Admitting: General Surgery

## 2021-11-13 DIAGNOSIS — I87332 Chronic venous hypertension (idiopathic) with ulcer and inflammation of left lower extremity: Secondary | ICD-10-CM | POA: Diagnosis not present

## 2021-11-13 DIAGNOSIS — L97322 Non-pressure chronic ulcer of left ankle with fat layer exposed: Secondary | ICD-10-CM | POA: Diagnosis not present

## 2021-11-13 DIAGNOSIS — F17200 Nicotine dependence, unspecified, uncomplicated: Secondary | ICD-10-CM | POA: Diagnosis not present

## 2021-11-13 DIAGNOSIS — L97328 Non-pressure chronic ulcer of left ankle with other specified severity: Secondary | ICD-10-CM | POA: Diagnosis not present

## 2021-11-13 NOTE — Progress Notes (Signed)
Hipp, [name] ([id number]) ?Visit Report for 11/13/2021 ?Arrival Information Details ?Patient Name: Date of Service: ?[name], [name] 11/13/2021 10:15 A M ?Medical Record Number: [id number] ?Patient Account Number: [id number] ?Date of Birth/Sex: Treating RN: ?Apr 21, 1976 (46 y.o. [name]) Dellie Catholic ?Primary Care Giannah Zavadil: Wells Guiles Other Clinician: ?Referring Keavon Sensing: ?Treating Dontel Harshberger/Extender: [name] ?Wells Guiles ?Weeks in Treatment: 40 ?Visit Information History Since Last Visit ?Added or deleted any medications: No ?Patient Arrived: Ambulatory ?Any new allergies or adverse reactions: No ?Arrival Time: 10:24 ?Had a fall or experienced change in No ?Accompanied By: Lajuan Lines ?activities of daily living that may affect ?Transfer Assistance: None ?risk of falls: ?Patient Requires Transmission-Based Precautions: No ?Signs or symptoms of abuse/neglect since last visito No ?Patient Has Alerts: No ?Hospitalized since last visit: No ?Implantable device outside of the clinic excluding No ?cellular tissue based products placed in the center ?since last visit: ?Has Dressing in Place as Prescribed: Yes ?Has Compression in Place as Prescribed: Yes ?Pain Present Now: No ?Electronic Signature(s) ?Signed: 11/13/2021 5:11:45 PM By: Dellie Catholic RN ?Entered By: Dellie Catholic on 11/13/2021 10:25:21 ?-------------------------------------------------------------------------------- ?Compression Therapy Details ?Patient Name: Date of Service: ?[name], [name] 11/13/2021 10:15 A M ?Medical Record Number: [id number] ?Patient Account Number: [id number] ?Date of Birth/Sex: Treating RN: ?1975/11/18 (46 y.o. [name]) Dellie Catholic ?Primary Care Amado Andal: Wells Guiles Other Clinician: ?Referring Eowyn Tabone: ?Treating Zariah Jost/Extender: [name] ?Wells Guiles ?Weeks in Treatment: 40 ?Compression Therapy Performed for Wound Assessment: Wound #1 Left Ankle ?Performed By: Clinician Dellie Catholic,  RN ?Compression Type: Three Layer ?Post Procedure Diagnosis ?Same as Pre-procedure ?Electronic Signature(s) ?Signed: 11/13/2021 5:11:45 PM By: Dellie Catholic RN ?Entered By: Dellie Catholic on 11/13/2021 10:59:22 ?-------------------------------------------------------------------------------- ?Encounter Discharge Information Details ?Patient Name: ?Date of Service: ?[name], [name] 11/13/2021 10:15 A M ?Medical Record Number: [id number] ?Patient Account Number: [id number] ?Date of Birth/Sex: ?Treating RN: ?1975-08-10 (46 y.o. [name]) Dellie Catholic ?Primary Care Maleigha Colvard: Wells Guiles ?Other Clinician: ?Referring Dewell Monnier: ?Treating Eryck Negron/Extender: [name] ?Wells Guiles ?Weeks in Treatment: 40 ?Encounter Discharge Information Items Post Procedure Vitals ?Discharge Condition: Stable ?Temperature (F): 98.6 ?Ambulatory Status: Ambulatory ?Pulse (bpm): 80 ?Discharge Destination: Home ?Respiratory Rate (breaths/min): 16 ?Transportation: Private Auto ?Blood Pressure (mmHg): 123/82 ?Accompanied By: INTERPRETER ?Schedule Follow-up Appointment: Yes ?Clinical Summary of Care: Patient Declined ?Electronic Signature(s) ?Signed: 11/13/2021 5:11:45 PM By: Dellie Catholic RN ?Entered By: Dellie Catholic on 11/13/2021 17:11:19 ?-------------------------------------------------------------------------------- ?Lower Extremity Assessment Details ?Patient Name: ?Date of Service: ?[name], [name] 11/13/2021 10:15 A M ?Medical Record Number: [id number] ?Patient Account Number: [id number] ?Date of Birth/Sex: ?Treating RN: ?[birth year] (46 y.o. [name]) Dellie Catholic ?Primary Care Diquan Kassis: Wells Guiles ?Other Clinician: ?Referring Rome Echavarria: ?Treating Phoenicia Pirie/Extender: [name] ?Wells Guiles ?Weeks in Treatment: 40 ?Edema Assessment ?Assessed: [Left: No] [Right: No] ?Edema: [Left: Ye] [Right: s] ?Calf ?Left: Right: ?Point of Measurement: 28 cm From Medial Instep 31.3 cm ?Ankle ?Left: Right: ?Point of Measurement: 8  cm From Medial Instep 22.9 cm ?Electronic Signature(s) ?Signed: 11/13/2021 5:11:45 PM By: Dellie Catholic RN ?Entered By: Dellie Catholic on 11/13/2021 10:31:58 ?-------------------------------------------------------------------------------- ?Multi Wound Chart Details ?Patient Name: ?Date of Service: ?[name], [name] 11/13/2021 10:15 A M ?Medical Record Number: [id number] ?Patient Account Number: [id number] ?Date of Birth/Sex: ?Treating RN: ?1976-03-31 (46 y.o. [name]) Dellie Catholic ?Primary Care Hadrian Yarbrough: Wells Guiles ?Other Clinician: ?Referring Chardonay Scritchfield: ?Treating Tu Bayle/Extender: [name] ?Wells Guiles ?Weeks in Treatment: 40 ?Vital Signs ?Height(in): ?Pulse(bpm): 80 ?Weight(lbs): 170 ?Blood Pressure(mmHg): 123/82 ?Body Mass Index(BMI): ?Temperature(??F): 98.6 ?Respiratory Rate(breaths/min): 16 ?Photos: [N/A:N/A] ?Left Ankle  N/A N/A ?Wound Location: ?Trauma N/A N/A ?Wounding Event: ?Trauma, Other N/A N/A ?Primary Etiology: ?10/14/2020 N/A N/A ?Date Acquired: ?81 N/A N/A ?Weeks of Treatment: ?Open N/A N/A ?Wound Status: ?No N/A N/A ?Wound Recurrence: ?2x5.6x0.2 N/A N/A ?Measurements L x W x D (cm) ?8.796 N/A N/A ?A (cm?) : ?rea ?1.759 N/A N/A ?Volume (cm?) : ?85.70% N/A N/A ?% Reduction in A rea: ?92.90% N/A N/A ?% Reduction in Volume: ?Full Thickness Without Exposed N/A N/A ?Classification: ?Support Structures ?Medium N/A N/A ?Exudate A mount: ?Serosanguineous N/A N/A ?Exudate Type: ?red, brown N/A N/A ?Exudate Color: ?Flat and Intact N/A N/A ?Wound Margin: ?Large (67-100%) N/A N/A ?Granulation A mount: ?Red N/A N/A ?Granulation Quality: ?Small (1-33%) N/A N/A ?Necrotic A mount: ?Fat Layer (Subcutaneous Tissue): Yes N/A N/A ?Exposed Structures: ?Fascia: No ?Tendon: No ?Muscle: No ?Joint: No ?Bone: No ?Medium (34-66%) N/A N/A ?Epithelialization: ?Debridement - Excisional N/A N/A ?Debridement: ?Pre-procedure Verification/Time Out 10:45 N/A N/A ?Taken: ?Other N/A N/A ?Pain Control: ?Subcutaneous, Slough  N/A N/A ?Tissue Debrided: ?Skin/Subcutaneous Tissue N/A N/A ?Level: ?11.2 N/A N/A ?Debridement A (sq cm): ?rea ?Curette N/A N/A ?Instrument: ?Minimum N/A N/A ?Bleeding: ?Pressure N/A N/A ?Hemostasis A chieved: ?0 N/A N/A ?Procedural Pain: ?0 N/A N/A ?Post Procedural Pain: ?Procedure was tolerated well N/A N/A ?Debridement Treatment Response: ?2x5.6x0.2 N/A N/A ?Post Debridement Measurements L x ?W x D (cm) ?1.759 N/A N/A ?Post Debridement Volume: (cm?) ?Cellular or Tissue Based Product N/A N/A ?Procedures Performed: ?Compression Therapy ?Debridement ?Treatment Notes ?Electronic Signature(s) ?Signed: 11/13/2021 12:23:29 PM By: [name] MD FACS ?Signed: 11/13/2021 5:11:45 PM By: Dellie Catholic RN ?Entered By: [name] on 11/13/2021 12:23:29 ?-------------------------------------------------------------------------------- ?Multi-Disciplinary Care Plan Details ?Patient Name: ?Date of Service: ?[name], [name] 11/13/2021 10:15 A M ?Medical Record Number: [id number] ?Patient Account Number: [id number] ?Date of Birth/Sex: ?Treating RN: ?02/26/1976 (46 y.o. [name]) Dellie Catholic ?Primary Care Zenobia Kuennen: Wells Guiles ?Other Clinician: ?Referring Victorious Cosio: ?Treating Abigayl Hor/Extender: [name] ?Wells Guiles ?Weeks in Treatment: 40 ?Multidisciplinary Care Plan reviewed with physician ?Active Inactive ?Wound/Skin Impairment ?Nursing Diagnoses: ?Knowledge deficit related to ulceration/compromised skin integrity ?Goals: ?Patient/caregiver will verbalize understanding of skin care regimen ?Date Initiated: 02/05/2021 ?Target Resolution Date: 11/26/2021 ?Goal Status: Active ?Interventions: ?Assess patient/caregiver ability to obtain necessary supplies ?Assess patient/caregiver ability to perform ulcer/skin care regimen upon admission and as needed ?Provide education on ulcer and skin care ?Treatment Activities: ?Skin care regimen initiated : 02/05/2021 ?Topical wound management initiated :  02/05/2021 ?Notes: ?03/31/21: Wound care regimen ongoing, target date extended. 04/21/21: Wound care ongoing, through interpreter patient states he is doing fine with his dressing ?changes. ?Electronic Signature(s) ?Signed: 11/13/2021 5
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
## 2021-11-20 ENCOUNTER — Encounter (HOSPITAL_BASED_OUTPATIENT_CLINIC_OR_DEPARTMENT_OTHER): Payer: Medicaid Other | Admitting: General Surgery

## 2021-11-20 DIAGNOSIS — F17200 Nicotine dependence, unspecified, uncomplicated: Secondary | ICD-10-CM | POA: Diagnosis not present

## 2021-11-20 DIAGNOSIS — I87332 Chronic venous hypertension (idiopathic) with ulcer and inflammation of left lower extremity: Secondary | ICD-10-CM | POA: Diagnosis not present

## 2021-11-20 DIAGNOSIS — L97328 Non-pressure chronic ulcer of left ankle with other specified severity: Secondary | ICD-10-CM | POA: Diagnosis not present

## 2021-11-20 NOTE — Progress Notes (Signed)
Tagg, [name] ([id number]) ?Visit Report for 11/20/2021 ?SuperBill Details ?Patient Name: Date of Service: ?[name], [name] 11/20/2021 ?Medical Record Number: [id number] ?Patient Account Number: [id number] ?Date of Birth/Sex: Treating RN: ?May 29, 1976 (46 y.o. [name] ?Primary Care Provider: Shelby [name] Other Clinician: ?Referring Provider: ?Treating Provider/Extender: Duanne Guess ?[name] ?Weeks in Treatment: 41 ?Diagnosis Coding ?ICD-10 Codes ?Code Description ?L97.328 Non-pressure chronic ulcer of left ankle with other specified severity ?P71.062 Chronic venous hypertension (idiopathic) with ulcer and inflammation of left lower extremity ?Facility Procedures ?CPT4 Code Description Modifier Quantity ?[id number] (Facility Use Only) [phone] - APPLY MULTLAY COMPRS LWR LT LEG 1 ?Electronic Signature(s) ?Signed: 11/20/2021 12:20:05 PM By: [name] FACS ?Signed: 11/20/2021 6:05:16 PM By: [name], BSN ?Entered By: Zenaida Deed on 11/20/2021 12:17:25 ?

## 2021-11-23 NOTE — Progress Notes (Signed)
Corralejo, [name] ([id number]) ?Visit Report for 11/20/2021 ?Arrival Information Details ?Patient Name: Date of Service: ?[name], [name] 11/20/2021 11:30 [name] M ?Medical Record Number: [id number] ?Patient Account Number: [id number] ?Date of Birth/Sex: Treating RN: ?04-Aug-1975 (46 y.o. [name] ?Primary Care Cami Delawder: [name] Other Clinician: ?Referring Jayshaun Phillips: ?Treating Dreyton Roessner/Extender: Duanne Guess ?[name] ?Weeks in Treatment: 41 ?Visit Information History Since Last Visit ?Added or deleted any medications: No ?Patient Arrived: Ambulatory ?Any new allergies or adverse reactions: No ?Arrival Time: 11:54 ?Had [name] fall or experienced change in No ?Accompanied By: slef ?activities of daily living that may affect ?Transfer Assistance: None ?risk of falls: ?Patient Identification Verified: Yes ?Signs or symptoms of abuse/neglect since last visito No ?Secondary Verification Process Completed: Yes ?Hospitalized since last visit: No ?Patient Requires Transmission-Based Precautions: No ?Implantable device outside of the clinic excluding No ?Patient Has Alerts: No ?cellular tissue based products placed in the center ?since last visit: ?Has Dressing in Place as Prescribed: Yes ?Pain Present Now: No ?Electronic Signature(s) ?Signed: 11/23/2021 2:08:13 PM By: Karl Ito ?Entered By: Karl Ito on 11/20/2021 11:55:02 ?-------------------------------------------------------------------------------- ?Compression Therapy Details ?Patient Name: Date of Service: ?[name], [name] 11/20/2021 11:30 [name] M ?Medical Record Number: [id number] ?Patient Account Number: [id number] ?Date of Birth/Sex: Treating RN: ?1975/09/10 (46 y.o. [name] ?Primary Care Destini Cambre: [name] Other Clinician: ?Referring Malikhi Ogan: ?Treating Minoru Chap/Extender: Duanne Guess ?[name] ?Weeks in Treatment: 41 ?Compression Therapy Performed for Wound Assessment: Wound #1 Left Ankle ?Performed By:  Clinician Zenaida Deed, RN ?Compression Type: Three Layer ?Electronic Signature(s) ?Signed: 11/20/2021 6:05:16 PM By: [name], BSN ?Entered By: Zenaida Deed on 11/20/2021 12:15:32 ?-------------------------------------------------------------------------------- ?Encounter Discharge Information Details ?Patient Name: Date of Service: ?[name], [name] 11/20/2021 11:30 [name] M ?Medical Record Number: [id number] ?Patient Account Number: [id number] ?Date of Birth/Sex: ?Treating RN: ?[birth year] (46 y.o. [name] ?Primary Care Rosary Filosa: ?Other Clinician: ?[name] ?Referring Tacha Manni: ?Treating Araceli Coufal/Extender: Duanne Guess ?[name] ?Weeks in Treatment: 41 ?Encounter Discharge Information Items ?Discharge Condition: Stable ?Ambulatory Status: Ambulatory ?Discharge Destination: Home ?Transportation: Private Auto ?Accompanied By: self ?Schedule Follow-up Appointment: Yes ?Clinical Summary of Care: Patient Declined ?Electronic Signature(s) ?Signed: 11/20/2021 6:05:16 PM By: [name], BSN ?Entered By: Zenaida Deed on 11/20/2021 12:17:15 ?-------------------------------------------------------------------------------- ?Patient/Caregiver Education Details ?Patient Name: ?Date of Service: ?[name] [name], [name] 4/21/2023andnbsp11:30 [name] M ?Medical Record Number: [id number] ?Patient Account Number: [id number] ?Date of Birth/Gender: ?Treating RN: ?05-25-1976 (46 y.o. [name] ?Primary Care Physician: [name] ?Other Clinician: ?Referring Physician: ?Treating Physician/Extender: Duanne Guess ?[name] ?Weeks in Treatment: 41 ?Education Assessment ?Education Provided To: ?Patient ?Education Topics Provided ?Venous: ?Methods: Explain/Verbal ?Responses: Reinforcements needed, State content correctly ?Electronic Signature(s) ?Signed: 11/20/2021 6:05:16 PM By: [name], BSN ?Entered By: Zenaida Deed on 11/20/2021  12:17:01 ?-------------------------------------------------------------------------------- ?Wound Assessment Details ?Patient Name: ?Date of Service: ?[name], [name] 11/20/2021 11:30 [name] M ?Medical Record Number: [id number] ?Patient Account Number: [id number] ?Date of Birth/Sex: ?Treating RN: ?1976-07-01 (46 y.o. [name] ?Primary Care Deneane Stifter: [name] ?Other Clinician: ?Referring Shakayla Hickox: ?Treating Chesley Valls/Extender: Duanne Guess ?[name] ?Weeks in Treatment: 41 ?Wound Status ?Wound Number: 1 ?Primary Etiology: Trauma, Other ?Wound Location: Left Ankle ?Wound Status: Open ?Wounding Event: Trauma ?Date Acquired: 10/14/2020 ?Weeks Of Treatment: 41 ?Clustered Wound: No ?Wound Measurements ?Length: (cm) 2 ?Width: (cm) 5.6 ?Depth: (cm) 0.2 ?Area: (cm?) 8.796 ?Volume: (cm?) 1.759 ?% Reduction in Area: 85.7% ?% Reduction in Volume: 92.9% ?Wound Description ?Classification: Full Thickness Without Exposed Support  Structu ?Exudate Amount: Medium ?Exudate Type: Serosanguineous ?Exudate Color: red, brown ?res ?Treatment Notes ?Wound #1 (Ankle) Wound Laterality: Left ?Cleanser ?Soap and Water ?Discharge Instruction: May shower and wash wound with dial antibacterial soap and water prior to dressing change. ?Wound Cleanser ?Discharge Instruction: Cleanse the wound with wound cleanser prior to applying [name] clean dressing using gauze sponges, not tissue or cotton ?balls. ?Peri-Wound Care ?Sween Lotion (Moisturizing lotion) ?Discharge Instruction: Apply moisturizing lotion as directed ?Topical ?Skintegrity Hydrogel 4 (oz) ?Discharge Instruction: Apply hydrogel as directed ?Primary Dressing ?TheraSkin ?Quantity: 1 ?Secondary Dressing ?Zetuvit Plus 4x8 in ?Discharge Instruction: Apply over primary dressing as directed. ?Secured With ?Compression Wrap ?ThreePress (3 layer compression wrap) ?Discharge Instruction: Apply three layer compression as directed. ?Compression Stockings ?Add-Ons ?Electronic  Signature(s) ?Signed: 11/20/2021 6:10:38 PM By: Zandra Abts RN, BSN ?Signed: 11/23/2021 2:08:13 PM By: Karl Ito ?Entered By: Karl Ito on 11/20/2021 11:56:13 ?-------------------------------------------------------------------------------- ?Vitals Details ?Patient Name: ?Date of Service: ?[name], [name] 11/20/2021 11:30 [name] M ?Medical Record Number: [id number] ?Patient Account Number: [id number] ?Date of Birth/Sex: ?Treating RN: ?25-Dec-1975 (46 y.o. [name] ?Primary Care Faye Strohman: [name] ?Other Clinician: ?Referring Abdel Effinger: ?Treating Charlesia Canaday/Extender: Duanne Guess ?[name] ?Weeks in Treatment: 41 ?Vital Signs ?Time Taken: 11:55 ?Temperature (??F): 98.5 ?Weight (lbs): 170 ?Pulse (bpm): 73 ?Respiratory Rate (breaths/min): 16 ?Blood Pressure (mmHg): 128/79 ?Reference Range: 80 - 120 mg / dl ?Electronic Signature(s) ?Signed: 11/23/2021 2:08:13 PM By: Karl Ito ?Entered By: Karl Ito on 11/20/2021 11:55:20 ?

## 2021-11-27 ENCOUNTER — Encounter (HOSPITAL_BASED_OUTPATIENT_CLINIC_OR_DEPARTMENT_OTHER): Payer: Medicaid Other | Admitting: General Surgery

## 2021-11-27 DIAGNOSIS — I87332 Chronic venous hypertension (idiopathic) with ulcer and inflammation of left lower extremity: Secondary | ICD-10-CM | POA: Diagnosis not present

## 2021-11-27 DIAGNOSIS — L97328 Non-pressure chronic ulcer of left ankle with other specified severity: Secondary | ICD-10-CM | POA: Diagnosis not present

## 2021-11-27 DIAGNOSIS — F17200 Nicotine dependence, unspecified, uncomplicated: Secondary | ICD-10-CM | POA: Diagnosis not present
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
## 2021-11-27 NOTE — Progress Notes (Signed)
Nitschke, [name] ([id number]) ?Visit Report for 11/27/2021 ?Arrival Information Details ?Patient Name: Date of Service: ?[name], [name] 11/27/2021 10:15 A M ?Medical Record Number: [id number] ?Patient Account Number: [id number] ?Date of Birth/Sex: Treating RN: ?1976-06-12 (46 y.o. Judie Petit) Karie Schwalbe ?Primary Care Judah Chevere: [name] Other Clinician: ?Referring Shanon Becvar: ?Treating Nyzier Boivin/Extender: Duanne Guess ?[name] ?Weeks in Treatment: 42 ?Visit Information History Since Last Visit ?Added or deleted any medications: No ?Patient Arrived: Ambulatory ?Any new allergies or adverse reactions: No ?Arrival Time: 10:22 ?Had a fall or experienced change in No ?Accompanied By: interpreter ?activities of daily living that may affect ?Transfer Assistance: None ?risk of falls: ?Patient Identification Verified: Yes ?Signs or symptoms of abuse/neglect since last visito No ?Patient Requires Transmission-Based Precautions: No ?Hospitalized since last visit: No ?Patient Has Alerts: No ?Implantable device outside of the clinic excluding No ?cellular tissue based products placed in the center ?since last visit: ?Has Dressing in Place as Prescribed: Yes ?Pain Present Now: No ?Electronic Signature(s) ?Signed: 11/27/2021 1:56:21 PM By: Karie Schwalbe RN ?Entered By: Karie Schwalbe on 11/27/2021 10:22:47 ?-------------------------------------------------------------------------------- ?Compression Therapy Details ?Patient Name: Date of Service: ?[name], [name] 11/27/2021 10:15 A M ?Medical Record Number: [id number] ?Patient Account Number: [id number] ?Date of Birth/Sex: Treating RN: ?[birth year] (46 y.o. Judie Petit) Karie Schwalbe ?Primary Care Latyra Jaye: [name] Other Clinician: ?Referring Nader Boys: ?Treating Meko Bellanger/Extender: Duanne Guess ?[name] ?Weeks in Treatment: 42 ?Compression Therapy Performed for Wound Assessment: Wound #1 Left Ankle ?Performed By: Clinician Karie Schwalbe, RN ?Compression  Type: Three Layer ?Post Procedure Diagnosis ?Same as Pre-procedure ?Electronic Signature(s) ?Signed: 11/27/2021 1:56:21 PM By: Karie Schwalbe RN ?Entered By: Karie Schwalbe on 11/27/2021 11:30:30 ?-------------------------------------------------------------------------------- ?Encounter Discharge Information Details ?Patient Name: ?Date of Service: ?[name], [name] 11/27/2021 10:15 A M ?Medical Record Number: [id number] ?Patient Account Number: [id number] ?Date of Birth/Sex: ?Treating RN: ?[birth year] (46 y.o. [name] ?Primary Care Oriel Rumbold: [name] ?Other Clinician: ?Referring Abria Vannostrand: ?Treating Juliah Scadden/Extender: Duanne Guess ?[name] ?Weeks in Treatment: 42 ?Encounter Discharge Information Items Post Procedure Vitals ?Discharge Condition: Stable ?Temperature (F): 98.4 ?Ambulatory Status: Ambulatory ?Pulse (bpm): 74 ?Discharge Destination: Home ?Respiratory Rate (breaths/min): 18 ?Transportation: Private Auto ?Blood Pressure (mmHg): 131/80 ?Accompanied By: self ?Schedule Follow-up Appointment: Yes ?Clinical Summary of Care: Patient Declined ?Electronic Signature(s) ?Signed: 11/27/2021 3:54:04 PM By: [name], BSN ?Entered By: Zenaida Deed on 11/27/2021 11:42:03 ?-------------------------------------------------------------------------------- ?Lower Extremity Assessment Details ?Patient Name: ?Date of Service: ?[name], [name] 11/27/2021 10:15 A M ?Medical Record Number: [id number] ?Patient Account Number: [id number] ?Date of Birth/Sex: ?Treating RN: ?10-06-1975 (46 y.o. Judie Petit) Karie Schwalbe ?Primary Care Charmane Protzman: [name] ?Other Clinician: ?Referring Jeramey Lanuza: ?Treating Karimah Winquist/Extender: Duanne Guess ?[name] ?Weeks in Treatment: 42 ?Edema Assessment ?Assessed: [Left: No] [Right: No] ?Edema: [Left: Ye] [Right: s] ?Calf ?Left: Right: ?Point of Measurement: 28 cm From Medial Instep 31 cm ?Ankle ?Left: Right: ?Point of Measurement: 8 cm From Medial  Instep 22.3 cm ?Electronic Signature(s) ?Signed: 11/27/2021 1:56:21 PM By: Karie Schwalbe RN ?Entered By: Karie Schwalbe on 11/27/2021 10:37:30 ?-------------------------------------------------------------------------------- ?Multi Wound Chart Details ?Patient Name: ?Date of Service: ?[name], [name] 11/27/2021 10:15 A M ?Medical Record Number: [id number] ?Patient Account Number: [id number] ?Date of Birth/Sex: ?Treating RN: ?1976/02/05 (46 y.o. Judie Petit) Karie Schwalbe ?Primary Care Jakiah Bienaime: [name] ?Other Clinician: ?Referring Alazne Quant: ?Treating Loyal Rudy/Extender: Duanne Guess ?[name] ?Weeks in Treatment: 42 ?Vital Signs ?Height(in): ?Pulse(bpm): 74 ?Weight(lbs): 170 ?Blood Pressure(mmHg): 131/80 ?Body Mass Index(BMI): ?Temperature(??F): 98.4 ?Respiratory Rate(breaths/min): 16 ?Photos: [N/A:N/A] ?Left Ankle N/A N/A ?  Wound Location: ?Trauma N/A N/A ?Wounding Event: ?Trauma, Other N/A N/A ?Primary Etiology: ?10/14/2020 N/A N/A ?Date Acquired: ?33 N/A N/A ?Weeks of Treatment: ?Open N/A N/A ?Wound Status: ?No N/A N/A ?Wound Recurrence: ?2x5.5x0.1 N/A N/A ?Measurements L x W x D (cm) ?8.639 N/A N/A ?A (cm?) : ?rea ?0.864 N/A N/A ?Volume (cm?) : ?86.00% N/A N/A ?% Reduction in A rea: ?96.50% N/A N/A ?% Reduction in Volume: ?Full Thickness Without Exposed N/A N/A ?Classification: ?Support Structures ?Medium N/A N/A ?Exudate Amount: ?Serosanguineous N/A N/A ?Exudate Type: ?red, brown N/A N/A ?Exudate Color: ?Medium (34-66%) N/A N/A ?Granulation Amount: ?Red N/A N/A ?Granulation Quality: ?Medium (34-66%) N/A N/A ?Necrotic Amount: ?Fat Layer (Subcutaneous Tissue): Yes N/A N/A ?Exposed Structures: ?Fascia: No ?Tendon: No ?Muscle: No ?Joint: No ?Bone: No ?Large (67-100%) N/A N/A ?Epithelialization: ?Treatment Notes ?Electronic Signature(s) ?Signed: 11/27/2021 11:08:06 AM By: [name] FACS ?Signed: 11/27/2021 1:56:21 PM By: Karie Schwalbe RN ?Entered By: Duanne Guess on 11/27/2021  11:08:06 ?-------------------------------------------------------------------------------- ?Multi-Disciplinary Care Plan Details ?Patient Name: ?Date of Service: ?[name], [name] 11/27/2021 10:15 A M ?Medical Record Number: [id number] ?Patient Account Number: [id number] ?Date of Birth/Sex: ?Treating RN: ?06/10/1976 (46 y.o. Judie Petit) Karie Schwalbe ?Primary Care Lashante Fryberger: [name] ?Other Clinician: ?Referring Mohmmad Saleeby: ?Treating Hailynn Slovacek/Extender: Duanne Guess ?[name] ?Weeks in Treatment: 42 ?Multidisciplinary Care Plan reviewed with physician ?Active Inactive ?Wound/Skin Impairment ?Nursing Diagnoses: ?Knowledge deficit related to ulceration/compromised skin integrity ?Goals: ?Patient/caregiver will verbalize understanding of skin care regimen ?Date Initiated: 02/05/2021 ?Target Resolution Date: 12/18/2021 ?Goal Status: Active ?Interventions: ?Assess patient/caregiver ability to obtain necessary supplies ?Assess patient/caregiver ability to perform ulcer/skin care regimen upon admission and as needed ?Provide education on ulcer and skin care ?Treatment Activities: ?Skin care regimen initiated : 02/05/2021 ?Topical wound management initiated : 02/05/2021 ?Notes: ?03/31/21: Wound care regimen ongoing, target date extended. 04/21/21: Wound care ongoing, through interpreter patient states he is doing fine with his dressing ?changes. ?Electronic Signature(s) ?Signed: 11/27/2021 1:56:21 PM By: Karie Schwalbe RN ?Entered By: Karie Schwalbe on 11/27/2021 10:57:42 ?-------------------------------------------------------------------------------- ?Pain Assessment Details ?Patient Name: ?Date of Service: ?[name], [name] 11/27/2021 10:15 A M ?Medical Record Number: [id number] ?Patient Account Number: [id number] ?Date of Birth/Sex: ?Treating RN: ?[birth year] (46 y.o. Judie Petit) Karie Schwalbe ?Primary Care Takiah Maiden: [name] ?Other Clinician: ?Referring Annel Zunker: ?Treating Gaylia Kassel/Extender: Duanne Guess ?[name] ?Weeks in Treatment: 42 ?Active Problems ?Location of Pain Severity and Description of Pain ?Patient Has Paino No ?Site Locations ?Pain Management and Medication ?Current Pain Management: ?Electronic Signature(s) ?Sig

## 2021-11-30 DIAGNOSIS — Z419 Encounter for procedure for purposes other than remedying health state, unspecified: Secondary | ICD-10-CM | POA: Diagnosis not present

## 2021-12-04 ENCOUNTER — Encounter (HOSPITAL_BASED_OUTPATIENT_CLINIC_OR_DEPARTMENT_OTHER): Payer: Medicaid Other | Attending: General Surgery | Admitting: General Surgery

## 2021-12-04 DIAGNOSIS — L97328 Non-pressure chronic ulcer of left ankle with other specified severity: Secondary | ICD-10-CM | POA: Diagnosis not present

## 2021-12-04 DIAGNOSIS — L97529 Non-pressure chronic ulcer of other part of left foot with unspecified severity: Secondary | ICD-10-CM | POA: Insufficient documentation

## 2021-12-04 DIAGNOSIS — M25472 Effusion, left ankle: Secondary | ICD-10-CM | POA: Diagnosis not present

## 2021-12-04 DIAGNOSIS — M85872 Other specified disorders of bone density and structure, left ankle and foot: Secondary | ICD-10-CM | POA: Diagnosis not present

## 2021-12-04 DIAGNOSIS — F17200 Nicotine dependence, unspecified, uncomplicated: Secondary | ICD-10-CM | POA: Diagnosis not present

## 2021-12-04 NOTE — Progress Notes (Signed)
Hallett, [name] ([id number]) ?Visit Report for 12/04/2021 ?SuperBill Details ?Patient Name: Date of Service: ?[name], [name] 12/04/2021 ?Medical Record Number: [id number] ?Patient Account Number: [id number] ?Date of Birth/Sex: Treating RN: ?1976/02/11 (46 y.o. Male) Baruch Gouty ?Primary Care Provider: Wells Guiles Other Clinician: ?Referring Provider: ?Treating Provider/Extender: [name] ?Wells Guiles ?Weeks in Treatment: 55 ?Diagnosis Coding ?ICD-10 Codes ?Code Description ?L97.328 Non-pressure chronic ulcer of left ankle with other specified severity ?V070573 Chronic venous hypertension (idiopathic) with ulcer and inflammation of left lower extremity ?Facility Procedures ?CPT4 Code Description Modifier Quantity ?[id number] (Facility Use Only) [id number] - APPLY MULTLAY COMPRS LWR LT LEG 1 ?Electronic Signature(s) ?Signed: 12/04/2021 12:24:54 PM By: [name] MD FACS ?Signed: 12/04/2021 3:24:32 PM By: Baruch Gouty RN, BSN ?Entered By: Baruch Gouty on 12/04/2021 12:13:00 ?

## 2021-12-07 NOTE — Progress Notes (Signed)
Ruggerio, [name] ([id number]) ?Visit Report for 12/04/2021 ?Arrival Information Details ?Patient Name: Date of Service: ?[name], [name] 12/04/2021 11:30 [name] M ?Medical Record Number: [id number] ?Patient Account Number: [id number] ?Date of Birth/Sex: Treating RN: ?23-Jan-1976 (46 y.o. Male) Zandra Abts ?Primary Care [place] Pelto: [name] Other Clinician: ?Referring Zevin Nevares: ?Treating Trent Gabler/Extender: Duanne Guess ?[name] ?Weeks in Treatment: 68 ?Visit Information History Since Last Visit ?Added or deleted any medications: No ?Patient Arrived: Ambulatory ?Any new allergies or adverse reactions: No ?Arrival Time: 11:49 ?Had [name] fall or experienced change in No ?Accompanied By: translator ?activities of daily living that may affect ?Transfer Assistance: None ?risk of falls: ?Patient Identification Verified: Yes ?Signs or symptoms of abuse/neglect since last visito No ?Secondary Verification Process Completed: Yes ?Hospitalized since last visit: No ?Patient Requires Transmission-Based Precautions: No ?Implantable device outside of the clinic excluding No ?Patient Has Alerts: No ?cellular tissue based products placed in the center ?since last visit: ?Has Dressing in Place as Prescribed: Yes ?Pain Present Now: No ?Electronic Signature(s) ?Signed: 12/07/2021 1:54:41 PM By: Karl Ito ?Entered By: Karl Ito on 12/04/2021 11:50:24 ?-------------------------------------------------------------------------------- ?Compression Therapy Details ?Patient Name: Date of Service: ?[name], [name] 12/04/2021 11:30 [name] M ?Medical Record Number: [id number] ?Patient Account Number: [id number] ?Date of Birth/Sex: Treating RN: ?01-15-1976 (46 y.o. Male) Zenaida Deed ?Primary Care Randeep Biondolillo: [name] Other Clinician: ?Referring Goldye Tourangeau: ?Treating Jayna Mulnix/Extender: Duanne Guess ?[name] ?Weeks in Treatment: 19 ?Compression Therapy Performed for Wound Assessment: Wound #1 Left Ankle ?Performed  By: Clinician Zenaida Deed, RN ?Compression Type: Three Layer ?Electronic Signature(s) ?Signed: 12/04/2021 3:24:32 PM By: [name], BSN ?Entered By: Zenaida Deed on 12/04/2021 11:59:22 ?-------------------------------------------------------------------------------- ?Encounter Discharge Information Details ?Patient Name: Date of Service: ?[name], [name] 12/04/2021 11:30 [name] M ?Medical Record Number: [id number] ?Patient Account Number: [id number] ?Date of Birth/Sex: ?Treating RN: ?1975-10-14 (46 y.o. Male) Zenaida Deed ?Primary Care Amila Callies: ?Other Clinician: ?[name] ?Referring Jeovany Huitron: ?Treating Tylar Merendino/Extender: Duanne Guess ?[name] ?Weeks in Treatment: 31 ?Encounter Discharge Information Items ?Discharge Condition: Stable ?Ambulatory Status: Ambulatory ?Discharge Destination: Home ?Transportation: Private Auto ?Accompanied By: interpreter ?Schedule Follow-up Appointment: Yes ?Clinical Summary of Care: Patient Declined ?Electronic Signature(s) ?Signed: 12/04/2021 3:24:32 PM By: [name], BSN ?Entered By: Zenaida Deed on 12/04/2021 12:12:03 ?-------------------------------------------------------------------------------- ?Patient/Caregiver Education Details ?Patient Name: ?Date of Service: ?[name] [name], [name] 5/5/2023andnbsp11:30 [name] M ?Medical Record Number: [id number] ?Patient Account Number: [id number] ?Date of Birth/Gender: ?Treating RN: ?1976/03/23 (46 y.o. Male) Zenaida Deed ?Primary Care Physician: [name] ?Other Clinician: ?Referring Physician: ?Treating Physician/Extender: Duanne Guess ?[name] ?Weeks in Treatment: 102 ?Education Assessment ?Education Provided To: ?Patient ?Education Topics Provided ?Venous: ?Methods: Explain/Verbal ?Responses: Reinforcements needed, State content correctly ?Electronic Signature(s) ?Signed: 12/04/2021 3:24:32 PM By: [name], BSN ?Entered By: Zenaida Deed on 12/04/2021  12:01:03 ?-------------------------------------------------------------------------------- ?Wound Assessment Details ?Patient Name: ?Date of Service: ?[name], [name] 12/04/2021 11:30 [name] M ?Medical Record Number: [id number] ?Patient Account Number: [id number] ?Date of Birth/Sex: ?Treating RN: ?[birth year] (46 y.o. Male) Zandra Abts ?Primary Care Leaner Morici: [name] ?Other Clinician: ?Referring Treshon Stannard: ?Treating Kelty Szafran/Extender: Duanne Guess ?[name] ?Weeks in Treatment: 11 ?Wound Status ?Wound Number: 1 ?Primary Etiology: Trauma, Other ?Wound Location: Left Ankle ?Wound Status: Open ?Wounding Event: Trauma ?Date Acquired: 10/14/2020 ?Weeks Of Treatment: 43 ?Clustered Wound: No ?Wound Measurements ?Length: (cm) 2 ?Width: (cm) 5.5 ?Depth: (cm) 0.1 ?Area: (cm?) 8.639 ?Volume: (cm?) 0.864 ?% Reduction in Area: 86% ?% Reduction in Volume: 96.5% ?Wound Description ?Classification: Full Thickness Without Exposed Support  Structu ?Exudate Amount: Medium ?Exudate Type: Serosanguineous ?Exudate Color: red, brown ?res ?Treatment Notes ?Wound #1 (Ankle) Wound Laterality: Left ?Cleanser ?Soap and Water ?Discharge Instruction: May shower and wash wound with dial antibacterial soap and water prior to dressing change. ?Wound Cleanser ?Discharge Instruction: Cleanse the wound with wound cleanser prior to applying [name] clean dressing using gauze sponges, not tissue or cotton ?balls. ?Peri-Wound Care ?Sween Lotion (Moisturizing lotion) ?Discharge Instruction: Apply moisturizing lotion as directed ?Topical ?Primary Dressing ?TheraSkin ?Secondary Dressing ?ABD Pad, 8x10 ?Discharge Instruction: Apply over primary dressing as directed. ?Zetuvit Plus 4x8 in ?Discharge Instruction: Apply over primary dressing as directed. ?Secured With ?Compression Wrap ?ThreePress (3 layer compression wrap) ?Discharge Instruction: Apply three layer compression as directed. ?Compression Stockings ?Add-Ons ?Electronic Signature(s) ?Signed:  12/04/2021 1:22:09 PM By: Zandra Abts RN, BSN ?Signed: 12/07/2021 1:54:41 PM By: Karl Ito ?Entered By: Karl Ito on 12/04/2021 11:51:13 ?-------------------------------------------------------------------------------- ?Vitals Details ?Patient Name: ?Date of Service: ?[name], [name] 12/04/2021 11:30 [name] M ?Medical Record Number: [id number] ?Patient Account Number: [id number] ?Date of Birth/Sex: ?Treating RN: ?[birth year] (46 y.o. Male) Zandra Abts ?Primary Care Chirstine Defrain: [name] ?Other Clinician: ?Referring Aleaha Fickling: ?Treating Kullen Tomasetti/Extender: Duanne Guess ?[name] ?Weeks in Treatment: 27 ?Vital Signs ?Time Taken: 11:50 ?Temperature (??F): 97.6 ?Weight (lbs): 170 ?Pulse (bpm): 84 ?Respiratory Rate (breaths/min): 16 ?Blood Pressure (mmHg): 114/75 ?Reference Range: 80 - 120 mg / dl ?Electronic Signature(s) ?Signed: 12/07/2021 1:54:41 PM By: Karl Ito ?Entered By: Karl Ito on 12/04/2021 11:50:55 ?

## 2021-12-11 ENCOUNTER — Encounter (HOSPITAL_BASED_OUTPATIENT_CLINIC_OR_DEPARTMENT_OTHER): Payer: Medicaid Other | Admitting: General Surgery

## 2021-12-11 DIAGNOSIS — L97322 Non-pressure chronic ulcer of left ankle with fat layer exposed: Secondary | ICD-10-CM | POA: Diagnosis not present

## 2021-12-11 DIAGNOSIS — F17200 Nicotine dependence, unspecified, uncomplicated: Secondary | ICD-10-CM | POA: Diagnosis not present

## 2021-12-11 DIAGNOSIS — L97328 Non-pressure chronic ulcer of left ankle with other specified severity: Secondary | ICD-10-CM | POA: Diagnosis not present

## 2021-12-11 DIAGNOSIS — M85872 Other specified disorders of bone density and structure, left ankle and foot: Secondary | ICD-10-CM | POA: Diagnosis not present

## 2021-12-11 DIAGNOSIS — L97529 Non-pressure chronic ulcer of other part of left foot with unspecified severity: Secondary | ICD-10-CM | POA: Diagnosis not present

## 2021-12-11 DIAGNOSIS — M25472 Effusion, left ankle: Secondary | ICD-10-CM | POA: Diagnosis not present
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
## 2021-12-11 NOTE — Progress Notes (Signed)
Doublin, [name] ([id number]) ?Visit Report for 12/11/2021 ?Arrival Information Details ?Patient Name: Date of Service: ?[name], [name] 12/11/2021 10:15 [name] M ?Medical Record Number: [id number] ?Patient Account Number: [id number] ?Date of Birth/Sex: Treating RN: ?1976/01/16 (46 y.o. Judie Petit) Karie Schwalbe ?Primary Care Travian Kerner: [name] Other Clinician: ?Referring Siham Bucaro: ?Treating Windell Musson/Extender: Duanne Guess ?[name] ?Weeks in Treatment: 44 ?Visit Information History Since Last Visit ?Added or deleted any medications: No ?Patient Arrived: Ambulatory ?Any new allergies or adverse reactions: No ?Arrival Time: 10:18 ?Had [name] fall or experienced change in No ?Accompanied By: interpreter ?activities of daily living that may affect ?Transfer Assistance: None ?risk of falls: ?Patient Identification Verified: Yes ?Signs or symptoms of abuse/neglect since last visito No ?Patient Requires Transmission-Based Precautions: No ?Hospitalized since last visit: No ?Patient Has Alerts: No ?Implantable device outside of the clinic excluding No ?cellular tissue based products placed in the center ?since last visit: ?Has Dressing in Place as Prescribed: Yes ?Pain Present Now: No ?Electronic Signature(s) ?Signed: 12/11/2021 6:18:46 PM By: Karie Schwalbe RN ?Entered By: Karie Schwalbe on 12/11/2021 10:18:51 ?-------------------------------------------------------------------------------- ?Compression Therapy Details ?Patient Name: Date of Service: ?[name], [name] 12/11/2021 10:15 [name] M ?Medical Record Number: [id number] ?Patient Account Number: [id number] ?Date of Birth/Sex: Treating RN: ?19-Dec-1975 (46 y.o. Judie Petit) Karie Schwalbe ?Primary Care Jennette Leask: [name] Other Clinician: ?Referring Phyillis Dascoli: ?Treating Iasiah Ozment/Extender: Duanne Guess ?[name] ?Weeks in Treatment: 44 ?Compression Therapy Performed for Wound Assessment: Wound #1 Left Ankle ?Performed By: Clinician Karie Schwalbe, RN ?Compression  Type: Three Layer ?Post Procedure Diagnosis ?Same as Pre-procedure ?Electronic Signature(s) ?Signed: 12/11/2021 6:18:46 PM By: Karie Schwalbe RN ?Entered By: Karie Schwalbe on 12/11/2021 11:01:26 ?-------------------------------------------------------------------------------- ?Encounter Discharge Information Details ?Patient Name: ?Date of Service: ?[name], [name] 12/11/2021 10:15 [name] M ?Medical Record Number: [id number] ?Patient Account Number: [id number] ?Date of Birth/Sex: ?Treating RN: ?[birth year] (47 y.o. Judie Petit) Karie Schwalbe ?Primary Care Alveena Taira: [name] ?Other Clinician: ?Referring Walton Digilio: ?Treating Ferrel Simington/Extender: Duanne Guess ?[name] ?Weeks in Treatment: 44 ?Encounter Discharge Information Items Post Procedure Vitals ?Discharge Condition: Stable ?Temperature (F): 98.5 ?Ambulatory Status: Ambulatory ?Pulse (bpm): 81 ?Discharge Destination: Home ?Respiratory Rate (breaths/min): 16 ?Transportation: Private Auto ?Blood Pressure (mmHg): 119/66 ?Accompanied By: INTERPRETER ?Schedule Follow-up Appointment: Yes ?Clinical Summary of Care: Patient Declined ?Electronic Signature(s) ?Signed: 12/11/2021 6:18:46 PM By: Karie Schwalbe RN ?Entered By: Karie Schwalbe on 12/11/2021 17:30:16 ?-------------------------------------------------------------------------------- ?Lower Extremity Assessment Details ?Patient Name: ?Date of Service: ?[name], [name] 12/11/2021 10:15 [name] M ?Medical Record Number: [id number] ?Patient Account Number: [id number] ?Date of Birth/Sex: ?Treating RN: ?1976/02/01 (46 y.o. Judie Petit) Karie Schwalbe ?Primary Care Martel Galvan: [name] ?Other Clinician: ?Referring Boniface Goffe: ?Treating Murray Durrell/Extender: Duanne Guess ?[name] ?Weeks in Treatment: 44 ?Edema Assessment ?Assessed: [Left: No] [Right: No] ?Edema: [Left: Ye] [Right: s] ?Calf ?Left: Right: ?Point of Measurement: 28 cm From Medial Instep 31 cm ?Ankle ?Left: Right: ?Point of Measurement: 8 cm From Medial  Instep 22.5 cm ?Electronic Signature(s) ?Signed: 12/11/2021 6:18:46 PM By: Karie Schwalbe RN ?Entered By: Karie Schwalbe on 12/11/2021 10:33:33 ?-------------------------------------------------------------------------------- ?Multi Wound Chart Details ?Patient Name: ?Date of Service: ?[name], [name] 12/11/2021 10:15 [name] M ?Medical Record Number: [id number] ?Patient Account Number: [id number] ?Date of Birth/Sex: ?Treating RN: ?1975-09-14 (46 y.o. Judie Petit) Karie Schwalbe ?Primary Care Deziray Nabi: [name] ?Other Clinician: ?Referring Zoriah Pulice: ?Treating Paisely Brick/Extender: Duanne Guess ?[name] ?Weeks in Treatment: 44 ?Vital Signs ?Height(in): ?Pulse(bpm): 81 ?Weight(lbs): 170 ?Blood Pressure(mmHg): 119/66 ?Body Mass Index(BMI): ?Temperature(??F): 98.5 ?Respiratory Rate(breaths/min): 16 ?Photos: [[name]:[name]] ?Left Ankle [name] [name] ?Wound  Location: ?Trauma [name] [name] ?Wounding Event: ?Trauma, Other [name] [name] ?Primary Etiology: ?10/14/2020 [name] [name] ?Date Acquired: ?58 [name] [name] ?Weeks of Treatment: ?Open [name] [name] ?Wound Status: ?No [name] [name] ?Wound Recurrence: ?2x5.3x0.1 [name] [name] ?Measurements L x W x D (cm) ?8.325 [name] [name] ?[name] (cm?) : ?rea ?0.833 [name] [name] ?Volume (cm?) : ?86.50% [name] [name] ?% Reduction in [name] rea: ?96.60% [name] [name] ?% Reduction in Volume: ?Full Thickness Without Exposed [name] [name] ?Classification: ?Support Structures ?Medium [name] [name] ?Exudate [name] mount: ?Serosanguineous [name] [name] ?Exudate Type: ?red, brown [name] [name] ?Exudate Color: ?Medium (34-66%) [name] [name] ?Granulation [name] mount: ?Red [name] [name] ?Granulation Quality: ?Medium (34-66%) [name] [name] ?Necrotic [name] mount: ?Fat Layer (Subcutaneous Tissue): Yes [name] [name] ?Exposed Structures: ?Fascia: No ?Tendon: No ?Muscle: No ?Joint: No ?Bone: No ?Medium (34-66%) [name] [name] ?Epithelialization: ?Debridement - Selective/Open Wound [name] [name] ?Debridement: ?Pre-procedure Verification/Time Out 10:47 [name] [name] ?Taken: ?Lidocaine 5% topical ointment [name] [name] ?Pain Control: ?Slough [name] [name] ?Tissue  Debrided: ?Non-Viable Tissue [name] [name] ?Level: ?10.6 [name] [name] ?Debridement [name] (sq cm): ?rea ?Curette, Forceps, Scissors [name] [name] ?Instrument: ?Minimum [name] [name] ?Bleeding: ?Pressure [name] [name] ?Hemostasis [name] chieved: ?0 [name] [name] ?Procedural Pain: ?0 [name] [name] ?Post Procedural Pain: ?Procedure was tolerated well [name] [name] ?Debridement Treatment Response: ?2x5.3x0.1 [name] [name] ?Post Debridement Measurements L x ?W x D (cm) ?0.833 [name] [name] ?Post Debridement Volume: (cm?) ?Cellular or Tissue Based Product [name] [name] ?Procedures Performed: ?Compression Therapy ?Debridement ?Treatment Notes ?Electronic Signature(s) ?Signed: 12/11/2021 11:15:12 AM By: [name] FACS ?Signed: 12/11/2021 6:18:46 PM By: Karie Schwalbe RN ?Entered By: Duanne Guess on 12/11/2021 11:15:12 ?-------------------------------------------------------------------------------- ?Multi-Disciplinary Care Plan Details ?Patient Name: ?Date of Service: ?[name], [name] 12/11/2021 10:15 [name] M ?Medical Record Number: [id number] ?Patient Account Number: [id number] ?Date of Birth/Sex: ?Treating RN: ?1976/07/13 (46 y.o. Judie Petit) Karie Schwalbe ?Primary Care Mont Jagoda: [name] ?Other Clinician: ?Referring Alwaleed Obeso: ?Treating Precious Gilchrest/Extender: Duanne Guess ?[name] ?Weeks in Treatment: 44 ?Multidisciplinary Care Plan reviewed with physician ?Active Inactive ?Wound/Skin Impairment ?Nursing Diagnoses: ?Knowledge deficit related to ulceration/compromised skin integrity ?Goals: ?Patient/caregiver will verbalize understanding of skin care regimen ?Date Initiated: 02/05/2021 ?Target Resolution Date: 01/29/2022 ?Goal Status: Active ?Interventions: ?Assess patient/caregiver ability to obtain necessary supplies ?Assess patient/caregiver ability to perform ulcer/skin care regimen upon admission and as needed ?Provide education on ulcer and skin care ?Treatment Activities: ?Skin care regimen initiated : 02/05/2021 ?Topical wound management initiated : 02/05/2021 ?Notes: ?03/31/21:  Wound care regimen ongoing, target date extended. 04/21/21: Wound care ongoing, through interpreter patient states he is doing fine with his dressing ?changes. ?Electronic Signature(s) ?Signed: 12/11/2021 6:18:46 PM By: Kathie Rhodes

## 2021-12-18 ENCOUNTER — Encounter (HOSPITAL_BASED_OUTPATIENT_CLINIC_OR_DEPARTMENT_OTHER): Payer: Medicaid Other | Admitting: General Surgery

## 2021-12-18 DIAGNOSIS — L97529 Non-pressure chronic ulcer of other part of left foot with unspecified severity: Secondary | ICD-10-CM | POA: Diagnosis not present

## 2021-12-18 DIAGNOSIS — F17200 Nicotine dependence, unspecified, uncomplicated: Secondary | ICD-10-CM | POA: Diagnosis not present

## 2021-12-18 DIAGNOSIS — L97328 Non-pressure chronic ulcer of left ankle with other specified severity: Secondary | ICD-10-CM | POA: Diagnosis not present

## 2021-12-18 DIAGNOSIS — M85872 Other specified disorders of bone density and structure, left ankle and foot: Secondary | ICD-10-CM | POA: Diagnosis not present

## 2021-12-18 DIAGNOSIS — M25472 Effusion, left ankle: Secondary | ICD-10-CM | POA: Diagnosis not present

## 2021-12-18 NOTE — Progress Notes (Signed)
[name], [name] ([id number]) Visit Report for 12/18/2021 SuperBill Details Patient Name: Date of Service: [name] 12/18/2021 Medical Record Number: [id number] Patient Account Number: [id number] Date of Birth/Sex: Treating RN: [birth year] (46 y.o. 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

## 2021-12-18 NOTE — Progress Notes (Signed)
[name], [name] ([id number]) Visit Report for 12/18/2021 Arrival Information Details Patient Name: Date of Service: [name] [place] NIE 12/18/2021 11:30 A M Medical Record Number: [id number] Patient Account Number: [id number] Date of Birth/Sex: Treating RN: [birth year] (46 y.o. [name] Primary Care [name]: [name] Other Clinician: Referring [name]: Treating [name]/Extender: [name] in Treatment: 45 Visit Information History Since Last Visit Added or deleted any medications: No Patient Arrived: Ambulatory Any new allergies or adverse reactions: No Arrival Time: 11:13 Had a fall or experienced change in No Accompanied By: self activities of daily living that may affect Transfer Assistance: None risk of falls: Patient Identification Verified: Yes Signs or symptoms of abuse/neglect since last visito No Secondary Verification Process Completed: Yes Hospitalized since last visit: No Patient Requires Transmission-Based Precautions: No Implantable device outside of the clinic excluding No Patient Has Alerts: No cellular tissue based products placed in the center since last visit: Has Dressing in Place as Prescribed: Yes Has Compression in Place as Prescribed: Yes Pain Present Now: No Electronic Signature(s) Signed: 12/18/2021 3:21:06 PM By: Samuella [name] Entered By: Samuella [name] on 12/18/2021 11:13:37 -------------------------------------------------------------------------------- Compression Therapy Details Patient Name: Date of Service: [name] 12/18/2021 11:30 A M Medical Record Number: [id number] Patient Account Number: [id number] Date of Birth/Sex: Treating RN: [birth year] (46 y.o. [name] Primary Care [name]: [name] Other Clinician: Referring [name]: Treating [name]/Extender: [name] in Treatment: 45 Compression Therapy Performed for  Wound Assessment: Wound #1 Left Ankle Performed By: Clinician Samuella Bruin, RN Compression Type: Three Layer Electronic Signature(s) Signed: 12/18/2021 3:21:06 PM By: Samuella [name] Entered By: Samuella [name] on 12/18/2021 11:14:08 -------------------------------------------------------------------------------- Encounter Discharge Information Details Patient Name: Date of Service: [name] 12/18/2021 11:30 A M Medical Record Number: [id number] Patient Account Number: [id number] Date of Birth/Sex: Treating RN: 06-03-1976 (46 y.o. [name] Primary Care Monee Dembeck: [name] Other Clinician: Referring Camron Essman: Treating [name]/Extender: [name] in Treatment: 50 Encounter Discharge Information Items Discharge Condition: Stable Ambulatory Status: Ambulatory Discharge Destination: Home Transportation: Private Auto Accompanied By: self Schedule Follow-up Appointment: Yes Clinical Summary of Care: Patient Declined Electronic Signature(s) Signed: 12/18/2021 3:21:06 PM By: [name] By: Samuella [name] on 12/18/2021 11:14:44 -------------------------------------------------------------------------------- Patient/Caregiver Education Details Patient Name: Date of Service: [name] 5/19/2023andnbsp11:30 A M Medical Record Number: [id number] Patient Account Number: [id number] Date of Birth/Gender: Treating RN: 1975-09-22 (46 y.o. [name] Primary Care Physician: [name] Other Clinician: Referring Physician: Treating Physician/Extender: [name] in Treatment: 45 Education Assessment Education Provided To: Patient Education Topics Provided Wound/Skin Impairment: Methods: Explain/Verbal Responses: Reinforcements needed, State content correctly [name]) Signed: 12/18/2021 3:21:06 PM By: Samuella [name] Entered By:  Samuella [name] on 12/18/2021 11:14:33 -------------------------------------------------------------------------------- Wound Assessment Details Patient Name: Date of Service: [name] 12/18/2021 11:30 A M Medical Record Number: [id number] Patient Account Number: [id number] Date of Birth/Sex: Treating RN: [birth year] (46 y.o. [name] Primary Care Jashira Cotugno: [name] Other Clinician: Referring Everet Flagg: Treating [name]/Extender: [name] in Treatment: 45 Wound Status Wound Number: 1 Primary Etiology: Trauma, Other Wound Location: Left Ankle Wound Status: Open Wounding Event: Trauma Date Acquired: 10/14/2020 Weeks Of Treatment: 45 Clustered Wound: No Wound Measurements Length: (cm) 2 Width: (cm) 5.3 Depth: (cm) 0.1 Area: (cm) 8.325 Volume: (cm) 0.833 % Reduction in Area: 86.5% % Reduction in Volume: 96.6% Epithelialization: Medium (34-66%) Wound Description Classification:  Full Thickness Without Exposed Support Structures Exudate Amount: Medium Exudate Type: Serosanguineous Exudate Color: red, brown Foul Odor After Cleansing: No Slough/Fibrino Yes Wound Bed Granulation Amount: Medium (34-66%) Exposed Structure Granulation Quality: Red Fascia Exposed: No Necrotic Amount: Medium (34-66%) Fat Layer (Subcutaneous Tissue) Exposed: Yes Necrotic Quality: Adherent Slough Tendon Exposed: No Muscle Exposed: No Joint Exposed: No Bone Exposed: No Treatment Notes Wound #1 (Ankle) Wound Laterality: Left Cleanser Soap and Water Discharge Instruction: May shower and wash wound with dial antibacterial soap and water prior to dressing change. Wound Cleanser Discharge Instruction: Cleanse the wound with wound cleanser prior to applying a clean dressing using gauze sponges, not tissue or cotton balls. Peri-Wound Care Sween Lotion (Moisturizing lotion) Discharge Instruction: Apply moisturizing lotion as  directed Topical Primary Dressing TheraSkin Secondary Dressing ABD Pad, 8x10 Discharge Instruction: Apply over primary dressing as directed. Zetuvit Plus 4x8 in Discharge Instruction: Apply over primary dressing as directed. Secured With Compression Wrap ThreePress (3 layer compression wrap) Discharge Instruction: Apply three layer compression as directed. Compression Stockings Add-Ons Electronic Signature(s) Signed: 12/18/2021 3:21:06 PM By: Samuella [name] Entered By: Samuella [name] on 12/18/2021 11:13:54 -------------------------------------------------------------------------------- Vitals Details Patient Name: Date of Service: [name] 12/18/2021 11:30 A M Medical Record Number: [id number] Patient Account Number: [id number] Date of Birth/Sex: Treating RN: 1976/01/13 (46 y.o. [name] Primary Care Delane Stalling: [name] Other Clinician: Referring Henreitta Spittler: Treating Hala Narula/Extender: [name] in Treatment: 45 Vital Signs Time Taken: 11:16 Temperature (F): 98.3 Weight (lbs): 170 Pulse (bpm): 85 Respiratory Rate (breaths/min): 18 Blood Pressure (mmHg): 122/85 Reference Range: 80 - 120 mg / dl Electronic Signature(s) Signed: 12/18/2021 3:21:06 PM By: Samuella [name] Entered By: Samuella [name] on 12/18/2021 11:16:45

## 2021-12-25 ENCOUNTER — Encounter (HOSPITAL_BASED_OUTPATIENT_CLINIC_OR_DEPARTMENT_OTHER): Payer: Medicaid Other | Admitting: General Surgery

## 2021-12-25 DIAGNOSIS — M85872 Other specified disorders of bone density and structure, left ankle and foot: Secondary | ICD-10-CM | POA: Diagnosis not present

## 2021-12-25 DIAGNOSIS — L97322 Non-pressure chronic ulcer of left ankle with fat layer exposed: Secondary | ICD-10-CM | POA: Diagnosis not present

## 2021-12-25 DIAGNOSIS — I87332 Chronic venous hypertension (idiopathic) with ulcer and inflammation of left lower extremity: Secondary | ICD-10-CM | POA: Diagnosis not present

## 2021-12-25 DIAGNOSIS — L97529 Non-pressure chronic ulcer of other part of left foot with unspecified severity: Secondary | ICD-10-CM | POA: Diagnosis not present

## 2021-12-25 DIAGNOSIS — F17200 Nicotine dependence, unspecified, uncomplicated: Secondary | ICD-10-CM | POA: Diagnosis not present

## 2021-12-25 DIAGNOSIS — L97328 Non-pressure chronic ulcer of left ankle with other specified severity: Secondary | ICD-10-CM | POA: Diagnosis not present

## 2021-12-25 DIAGNOSIS — M25472 Effusion, left ankle: Secondary | ICD-10-CM | POA: Diagnosis not present

## 2021-12-25 NOTE — Progress Notes (Signed)
[name] ([id number]) Visit Report for 12/25/2021 Arrival Information Details Patient Name: Date of Service: [name], [name] 12/25/2021 10:15 [name] M Medical Record Number: [id number] Patient Account Number: [id number] Date of Birth/Sex: Treating RN: 12/04/1975 (46 y.o. [name]) [name], [name] Primary Care [name]: [name] Other Clinician: Referring [name]: Treating [name]/Extender: [name]: [id number] Visit Information History Since Last Visit Added or deleted any medications: No Patient Arrived: Ambulatory Any new allergies or adverse reactions: No Arrival Time: 10:24 Had [name] fall or experienced change in No Accompanied By: interpreter activities of daily living that may affect Transfer Assistance: None risk of falls: Patient Requires Transmission-Based Precautions: No Signs or symptoms of abuse/neglect since last visito No Patient Has Alerts: No Hospitalized since last visit: No Implantable device outside of the clinic excluding No cellular tissue based products placed in the center since last visit: Has Dressing in Place as Prescribed: Yes Has Compression in Place as Prescribed: Yes Pain Present Now: No Electronic Signature(s) Signed: 12/25/2021 3:00:19 PM By: [name] SchwalbeScotton, Joanne RN Entered By: [name] on 12/25/2021 10:25:33 -------------------------------------------------------------------------------- Compression Therapy Details Patient Name: Date of Service: [name], [name] 12/25/2021 10:15 [name] M Medical Record Number: [id number] Patient Account Number: [id number] Date of Birth/Sex: Treating RN: 01/18/1976 (46 y.o. [name]) [name], [name] Primary Care Aven Cegielski: [name] Other Clinician: Referring Jaydence Arnesen: Treating Lacye Mccarn/Extender: [name]: 08: 46 Compression Therapy Performed for Wound Assessment: Wound #1 Left Ankle Performed By: Clinician [name] SchwalbeScotton, Joanne,  RN Compression Type: Three Layer Post Procedure Diagnosis Same as Pre-procedure Electronic Signature(s) Signed: 12/25/2021 3:00:19 PM By: [name] SchwalbeScotton, Joanne RN Entered By: [name] on 12/25/2021 10:54:28 -------------------------------------------------------------------------------- Encounter Discharge Information Details Patient Name: Date of Service: [name], [name] 12/25/2021 10:15 [name] M Medical Record Number: [id number] Patient Account Number: [id number] Date of Birth/Sex: Treating RN: 06/30/1976 (46 y.o. [name]) [name], [name] Primary Care Jamil Armwood: [name] Other Clinician: Referring Makinsey Pepitone: Treating Yurani Fettes/Extender: [name]: [id number] Encounter Discharge Information Items Post Procedure Vitals Discharge Condition: Stable Temperature (F): 98.1 Ambulatory Status: Ambulatory Pulse (bpm): 74 Discharge Destination: Home Respiratory Rate (breaths/min): 18 Transportation: Private Auto Blood Pressure (mmHg): 121/74 Accompanied By: Interpreter Schedule Follow-up Appointment: Yes Clinical Summary of Care: Patient Declined Electronic Signature(s) Signed: 12/25/2021 3:00:19 PM By: [name] SchwalbeScotton, Joanne RN Entered By: [name] on 12/25/2021 14:59:32 -------------------------------------------------------------------------------- Lower Extremity Assessment Details Patient Name: Date of Service: [name], [name] 12/25/2021 10:15 [name] M Medical Record Number: [id number] Patient Account Number: [id number] Date of Birth/Sex: Treating RN: 02/25/1976 (46 y.o. [name]) [name], [name] Primary Care Laurin Morgenstern: [name] Other Clinician: Referring Raelin Pixler: Treating Karel Mowers/Extender: [name]: 46 Edema Assessment Assessed: [name][Left: No] [name][Right: No] Edema: [Left: Ye] [Right: s] Calf Left: Right: Point of Measurement: 28 cm From Medial Instep 30.5 cm Ankle Left: Right: Point of Measurement: 8  cm From Medial Instep 22.5 cm Knee To Floor Left: Right: From Medial Instep 48 cm Electronic Signature(s) Signed: 12/25/2021 3:00:19 PM By: [name] SchwalbeScotton, Joanne RN Entered By: [name] on 12/25/2021 10:37:26 -------------------------------------------------------------------------------- Multi Wound Chart Details Patient Name: Date of Service: [name], [name] 12/25/2021 10:15 [name] M Medical Record Number: [id number] Patient Account Number: [id number] Date of Birth/Sex: Treating RN: 07/03/1976 (46 y.o. [name]) [name], [name] Primary Care Naylani Bradner: [name] Other Clinician: Referring Bronnie Vasseur: Treating Jatin Naumann/Extender: [name]: [phone] Vital Signs Height(in): Pulse(bpm): 74 Weight(lbs): 170 Blood Pressure(mmHg): 121/74 Body Mass  Index(BMI): Temperature(F): 98.1 Respiratory Rate(breaths/min): 18 Photos: [[name]:[name]] Left Ankle [name] [name] Wound Location: Trauma [name] [name] Wounding Event: Trauma, Other [name] [name] Primary Etiology: 10/14/2020 [name] [name] Date Acquired: 33 [name] [name] Weeks of [name]: Open [name] [name] Wound Status: No [name] [name] Wound Recurrence: 1.6x5x0.1 [name] [name] Measurements L x W x D (cm) 6.283 [name] [name] [name] (cm) : rea 0.628 [name] [name] Volume (cm) : 89.80% [name] [name] % Reduction in [name] rea: 97.50% [name] [name] % Reduction in Volume: Full Thickness Without Exposed [name] [name] Classification: Support Structures Medium [name] [name] Exudate [name] mount: Serosanguineous [name] [name] Exudate Type: red, brown [name] [name] Exudate Color: Medium (34-66%) [name] [name] Granulation [name] mount: Red [name] [name] Granulation Quality: Medium (34-66%) [name] [name] Necrotic [name] mount: Fat Layer (Subcutaneous Tissue): Yes [name] [name] Exposed Structures: Fascia: No Tendon: No Muscle: No Joint: No Bone: No Large (67-100%) [name] [name] Epithelialization: Debridement - Selective/Open Wound [name] [name] Debridement: Pre-procedure Verification/Time Out 10:47 [name] [name] Taken: Other [name] [name] Pain  Control: Necrotic/Eschar, Slough [name] [name] Tissue Debrided: Non-Viable Tissue [name] [name] Level: 8 [name] [name] Debridement [name] (sq cm): rea Curette [name] [name] Instrument: Minimum [name] [name] Bleeding: Pressure [name] [name] Hemostasis [name] chieved: 0 [name] [name] Procedural Pain: 0 [name] [name] Post Procedural Pain: Procedure was tolerated well [name] [name] Debridement [name] Response: 1.6x5x0.1 [name] [name] Post Debridement Measurements L x W x D (cm) 0.628 [name] [name] Post Debridement Volume: (cm) Compression Therapy [name] [name] Procedures Performed: Debridement [name] Notes Electronic Signature(s) Signed: 12/25/2021 11:05:06 AM By: [name] FACS Signed: 12/25/2021 3:00:19 PM By: [name] Schwalbe RN Entered By: Duanne Guess on 12/25/2021 11:05:06 -------------------------------------------------------------------------------- Multi-Disciplinary Care Plan Details Patient Name: Date of Service: [name] [name] Marseilles NA [name] 12/25/2021 10:15 [name] M Medical Record Number: [id number] Patient Account Number: [id number] Date of Birth/Sex: Treating RN: February 11, 1976 (46 y.o. [name] Limbo Primary Care Napoleon Monacelli: [name] Mattocks Other Clinician: Referring Harjit Douds: Treating Rex Oesterle/Extender: [name] Marshall in [name]: 19 Multidisciplinary Care Plan reviewed with physician Active Inactive Wound/Skin Impairment Nursing Diagnoses: Knowledge deficit related to ulceration/compromised skin integrity Goals: Patient/caregiver will verbalize understanding of skin care regimen Date Initiated: 02/05/2021 Target Resolution Date: 01/29/2022 Goal Status: Active Interventions: Assess patient/caregiver ability to obtain necessary supplies Assess patient/caregiver ability to perform ulcer/skin care regimen upon admission and as needed Provide education on ulcer and skin care [name] Activities: Skin care regimen initiated : 02/05/2021 Topical wound management initiated : 02/05/2021 Notes: 03/31/21: Wound  care regimen ongoing, target date extended. 04/21/21: Wound care ongoing, through interpreter patient states he is doing fine with his dressing changes. Electronic Signature(s) Signed: 12/25/2021 3:00:19 PM By: [name] Schwalbe RN Entered By: [name] Schwalbe on 12/25/2021 14:58:06 -------------------------------------------------------------------------------- Pain Assessment Details Patient Name: Date of Service: [name] [name] Marseilles NA [name] 12/25/2021 10:15 [name] M Medical Record Number: [id number] Patient Account Number: [id number] Date of Birth/Sex: Treating RN: 09-19-1975 (46 y.o. [name] Limbo Primary Care Muath Hallam: [name] Mattocks Other Clinician: Referring Kiowa Hollar: Treating Tayson Schnelle/Extender: [name] Marshall in [name]: 12 Active Problems Location of Pain Severity and Description of Pain Patient Has Paino No Site Locations Pain Management and Medication Current Pain Management: Electronic Signature(s) Signed: 12/25/2021 3:00:19 PM By: [name] Schwalbe RN Entered By: [name] Schwalbe on 12/25/2021 10:26:46 -------------------------------------------------------------------------------- Patient/Caregiver Education Details Patient Name: Date of Service: [name] [name] Marseilles NA [name] 5/26/2023andnbsp10:15 [name] M Medical Record Number: [id number] Patient Account Number: [id number] Date of Birth/Gender: Treating RN: 12-Sep-1975 (46 y.o. [name] Limbo Primary Care Physician: [name] Mattocks Other Clinician: Referring  Physician: Treating Physician/Extender: [name] Marshall in [name]: 76 Education Assessment Education Provided To: Patient Education Topics Provided Wound/Skin Impairment: Methods: Explain/Verbal Responses: Return demonstration correctly Electronic Signature(s) Signed: 12/25/2021 3:00:19 PM By: [name] Schwalbe RN Entered By: [name] Schwalbe on 12/25/2021  14:58:20 -------------------------------------------------------------------------------- Wound Assessment Details Patient Name: Date of Service: [name] [name] Marseilles NA [name] 12/25/2021 10:15 [name] M Medical Record Number: [id number] Patient Account Number: [id number] Date of Birth/Sex: Treating RN: [birth year] (46 y.o. [name] Limbo Primary Care Tanashia Ciesla: [name] Mattocks Other Clinician: Referring Michaeljohn Biss: Treating Sophie Quiles/Extender: [name] Marshall in [name]: 46 Wound Status Wound Number: 1 Primary Etiology: Trauma, Other Wound Location: Left Ankle Wound Status: Open Wounding Event: Trauma Date Acquired: 10/14/2020 Weeks Of [name]: 46 Clustered Wound: No Photos Wound Measurements Length: (cm) 1.6 Width: (cm) 5 Depth: (cm) 0.1 Area: (cm) 6.283 Volume: (cm) 0.628 % Reduction in Area: 89.8% % Reduction in Volume: 97.5% Epithelialization: Large (67-100%) Tunneling: No Undermining: No Wound Description Classification: Full Thickness Without Exposed Support Structures Exudate Amount: Medium Exudate Type: Serosanguineous Exudate Color: red, brown Foul Odor After Cleansing: No Slough/Fibrino Yes Wound Bed Granulation Amount: Medium (34-66%) Exposed Structure Granulation Quality: Red Fascia Exposed: No Necrotic Amount: Medium (34-66%) Fat Layer (Subcutaneous Tissue) Exposed: Yes Necrotic Quality: Adherent Slough Tendon Exposed: No Muscle Exposed: No Joint Exposed: No Bone Exposed: No [name] Notes Wound #1 (Ankle) Wound Laterality: Left Cleanser Soap and Water Discharge Instruction: May shower and wash wound with dial antibacterial soap and water prior to dressing change. Wound Cleanser Discharge Instruction: Cleanse the wound with wound cleanser prior to applying [name] clean dressing using gauze sponges, not tissue or cotton balls. Peri-Wound Care Sween Lotion (Moisturizing lotion) Discharge Instruction: Apply moisturizing lotion as  directed Topical Primary Dressing Endoform 2x2 in Discharge Instruction: Moisten with saline Secondary Dressing ABD Pad, 8x10 Discharge Instruction: Apply over primary dressing as directed. Zetuvit Plus 4x8 in Discharge Instruction: Apply over primary dressing as directed. Secured With Compression Wrap ThreePress (3 layer compression wrap) Discharge Instruction: Apply three layer compression as directed. Compression Stockings Add-Ons Electronic Signature(s) Signed: 12/25/2021 3:00:19 PM By: [name] Schwalbe RN Entered By: [name] Schwalbe on 12/25/2021 10:40:38 -------------------------------------------------------------------------------- Vitals Details Patient Name: Date of Service: [name] [name] Marseilles NA [name] 12/25/2021 10:15 [name] M Medical Record Number: [id number] Patient Account Number: [id number] Date of Birth/Sex: Treating RN: 30-Jun-1976 (46 y.o. [name] Limbo Primary Care Salaya Holtrop: [name] Mattocks Other Clinician: Referring Rasheed Welty: Treating Onita Pfluger/Extender: [name] Marshall in [name]: 9 Vital Signs Time Taken: 10:25 Temperature (F): 98.1 Weight (lbs): 170 Pulse (bpm): 74 Source: Stated Respiratory Rate (breaths/min): 18 Blood Pressure (mmHg): 121/74 Reference Range: 80 - 120 mg / dl Electronic Signature(s) Signed: 12/25/2021 3:00:19 PM By: [name] Schwalbe RN Entered By: [name] Schwalbe on 12/25/2021 10:26:39

## 2021-12-25 NOTE — Progress Notes (Signed)
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 listed below. He has not been specifically putting anything on this wound although when he was in the ER they prescribed bacitracin he is only been putting gauze. Apparently there is a lot of drainage associated with this. CLINICAL DATA: Left ankle swelling and pain. Wound. EXAM: LEFT ANKLE COMPLETE - 3+ VIEW COMPARISON: No prior. FINDINGS: Diffuse soft tissue swelling. Diffuse osteopenia degenerative change. Ossification noted over the high CS number a. no acute bony abnormality identified. No evidence of fracture. IMPRESSION: 1. Diffuse osteopenia and degenerative change. No acute abnormality identified. No acute bony abnormality identified. 2. Diffuse soft tissue swelling. No radiopaque foreign body. Past medical history; left ankle trauma as noted in 2003. The patient is a smoker he is not a diabetic lives with his wife. Came here with a Chief Executive Officer. He was brought here as a  refugee 02/11/2021; patient's ulcer is certainly no better today perhaps even more necrotic in the surface. Marked odor a lot of drainage which seep down into his normal skin below the ulcer on his lateral heel. X-ray I repeated last time was negative. Culture grew strep agalactiae perhaps not completely well covered by doxycycline that I gave him empirically. 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 Clearly left the [place] with something on the leg that rapidly expanded starting in March. He immigrated to the [place] on 05/22/2021. Other issues of importance is he has Medicaid which makes it difficult to get wound care supplies for dressings 7/20; the patient looks somewhat better with less of a necrotic surface. The odor is also improved. He is finishing the round of cephalexin I gave him I am not sure if that is the reason this is improved or whether this is all just colonized bacteria. In any case the patient says it is less painful and there appears to be less drainage. The patient was kindly seen by Dr. Arelia Longest after my conversation with Dr. Drucilla Schmidt last week. He has recommended biopsy with histology stain for fungal and AFB. As well as a separate  sample in saline for AFB culture fungal culture and bacterial culture. A separate sample can be sent to the [facility] for molecular testing for mycobacteriaooMycobacterium ulcerans/[place] ulcer I do not believe that this is some of the more atypical ulcers we see including pyoderma gangrenosum /pemphigus. It is quite possible that there is vascular issues here and I have tried to get him in for arterial and venous evaluation. Certainly the latter could be playing a primary role. 7/27; patient comes in with a wound absolutely no better. Marked malodor although he missed his appointment earlier this week for a dressing change. We still do not have vascular evaluation I ordered arterial and  venous. Again there are issues with communication here. He has completed the antibiotics I initially gave him for strep. I thought he was making some improvements but really no improvement in any aspect of this wound today. 8/5; interpreter present over the phone. Patient reports improvement in wound healing. He is currently taking the antibiotics prescribed by Dr. Linus Salmons (infectious disease). He has no issues or complaints today. He denies signs of infection. 03/10/2021 upon evaluation today patient appears to be doing okay in regard to his wound. This is measuring a little bit smaller. Does have a lot of slough and biofilm noted on the surface of the wound. I do believe that sharp debridement would be of benefit for him. 8/23; 3 and half weeks since I last saw this man. Quite an improvement. I note the biopsy I did was nonspecific stains for Mycobacterium and fungi were negative. He has been following with Dr. Lenna Gilford who is been helpful prescribing clarithromycin and Bactrim. He has now completed this. He also had arterial and venous studies. His arterial study on the right showed an ABI of 1.10 with a TBI of 1.08 on the left unfortunately they did not remove the bandages but his TBI was 0.73 which is normal. He also had venous reflux studies these showed evidence of venous reflux at the greater saphenous vein at the saphenofemoral junction as well as the greater saphenous vein proximally in the thigh but no reflux in the calf Things are quite a bit better than the last time I saw him although the progress is slow. We have been using silver alginate. 8/30; generally continuing improvement in surface area and condition of the wound surface we have been using Hydrofera Blue under compression. The patient's only complaint through the [place] interpreter is that he has some degree of itching 9/6; continued improvement in overall surface area down 1 cm in width we have been using Hydrofera Blue. We have  interviewed him through a [place] interpreter today. He reports no additional issues 9/13 not much change in surface area today. We have been using Hydrofera Blue. He was interviewed through the [place] interpreter today. Still have him under compression. We used [employer] imaging 9/20; the wound is actually larger in its width. Also noted an odor and drainage. I used Iodoflex last time to help with the debris on the surface. He is not on any antibiotics. We did this interview through the [place] interpreter 9/27; better and with today. Odor and drainage seems better. We use silver alginate last time and that seems to have helped. We used his neighbor his [place] interpreter 10/4; improved length and improved condition of the wound bed. We have been using silver alginate. We interviewed him through his [place] interpreter. I am going to have vein and vascular look at this including his reflux studies.  He came into the clinic with a very angry inflamed wound that admitted there for many months. This now looks a lot better. He did not have anything in the calf on the left that had significant reflux although he did have it in his thigh. I want to make sure that everything can be done for this man to prevent this from reoccurring He has Medicaid and we might be able to order him a TheraSkin for an advanced treatment option. We will look into this. 10/14; patient comes in after a 10-day hiatus. Drainage weeping through his wrap. Marked malodor although the surface of the wound does not look so bad and dimensions are about the same. Through the interpreter on the phone he is not complaining of pain 10/20; wound surface covered in fibrinous debris. This is largely on the lateral part of his foot. We interviewed him through a interpreter on the phone A little more drainage reported by our nurses. We have been using silver alginate under compression with sit to fit and CarboFlex He has been to  see infectious disease Dr. Linus Salmons. Noted that he has been on Bactrim and clarithromycin for possible mycobacterial or other indolent infection. I am not sure if he is still taking antibiotics but these are listed as being discontinued and by infectious disease 10/27; our intake nurse reported large amount of drainage today more than usual. We have been using silver alginate. He still has not seen vein and vascular about the reflux studies I am not sure what the issue is here. He is very itchy under the wound on the left lateral foot The patient comes into clinic concerned that the 1 year of Medicaid that apparently was assigned to him when he entered the [place]. This is now coming to an end. I told him that I thought the best thing to do is the [facility] I am not sure how else to help him with this. We of course will not discharge him which I think was his concern. He does have an appointment with Dr. Trula Slade on 11/7 with regards to the reflux studies. 11/8; the patient saw Dr. Trula Slade who noted mild at the saphenofemoral junction on the right but he did not feel that the vein was pathologic and he did not feel he would benefit from laser ablation. Suggested continuing to focus on wound care. We are using silver alginate with Bactroban 11/17; wound looks about the same. Still a fair amount of drainage here. Although the wound is coming in surface area it still a deep wound full-thickness. I am using silver alginate with Bactroban He really applied for Medicaid. Wondering about a skin graft. I am uncertain about that right now because of the drainage 12/1; wound is measuring slightly smaller in width. Surface of this looks better. Changed him to [facility] still using topical Bactroban 12/8; no major change in dimensions although the surface looks excellent we have been using Bactroban and covering Hydrofera Blue. Considering application for  TheraSkin if it is available through his version of Medicaid 12/15; nice healthy appearing wound advancing epithelialization 12/22; improvement in surface area using Bactroban under Hydrofera Blue. Originally a difficult large wound likely secondary to chronic venous insufficiency 08/06/2021; no major change in surface area. We are using Bactroban under Hydrofera Blue 08/19/2021; we are using Sorbact with covering calcium alginate and attempt to get a better looking wound surface with less debris.Still under compression He is denied  for TheraSkin by his version of Medicaid. This is in it self not that surprising 1/26; using Sorbact with covering silver alginate. Surfaces look better except for the lateral part of the left ankle wound. With the efforts of our staff we have him approved for TheraSkin through [facility] [previously we did not run the correct Medicaid version] 2/2; using Sorbact. Unfortunately the patient comes in with a large area of necrotic debris very malodorous. No clear surrounding infection. He is approved for TheraSkin but the wound bed just is not ready for that at this point. 2/9; because of the odor and debris last time we did not go ahead with Elgie Collard has a $4 affordable co-pay per application]. PCR culture I did last week showed high titers of E. coli moderate titers of Klebsiella and low titers of Pseudomonas Peptostreptococcus which is anaerobic. Does not have evidence of surrounding infection I have therefore elected to treat this with topical gentamicin under the silver alginate. Also with aggressive debridement 2/16; I'm using topical gentamicin to cover the culture gram negatives under silver alginate. Where making nice progress on this wound. I'm still have the thorough skin in reserve but I'm not ready to apply that next week perhaps ordero He still requiring debridement but overall the wound surfaces look a lot better 09/25/2021: I reviewed old images and I am truly  impressed with the significant improvement over time. He is still getting topical gentamicin under silver alginate with 3 layer compression. There has been substantial epithelialization. Drainage has improved and is significantly less. There is still some slough at the base, granulation tissue is forming. I think he is likely to be ready for TheraSkin application next week. 10/02/2021: There is just a minimal amount of slough present that was easily removed with a curette. Granulation tissue was present. TheraSkin and TheraSkin representative are on site for placement today. 10/16/2021: TheraSkin #1 application was done 2 weeks ago. I saw the wound when he came in for his 1 week follow-up check. All appeared to be progressing as expected. T oday, there is fairly good integration of the TheraSkin with granulation tissue beginning to but up through the fenestrations. There was a little bit of loss at the part of the wound over his dorsal foot and at the most lateral aspect by his malleolus, but the rest was fairly well adherent. 10/23/2021: TheraSkin #2 application was done last week. He was here today for a nurse visit, but when the dressing was taken down, blue-green staining typical of Pseudomonas was appreciated. The entire foot was quite macerated. The nurse called me into the room to evaluate. 10/30/2021: Last week, there was significant breakdown of the periwound skin and substantial drainage and odor. The drainage was blue-green, suggestive of Pseudomonas aeruginosa. We changed his dressing to silver alginate over topical gentamicin. We canceled the order for TheraSkin #3. T oday, he continues to have substantial drainage and his skin is again, quite macerated. There is an increase in the periwound erythema and the previously closed bridge of skin between the dorsum of his foot and his malleolus has reopened. The TheraSkin itself remained fairly adherent and there are some buds of granulation  tissue coming through the fenestrations. The wound is malodorous today. 11/06/2021: Over the the past week the wound has demonstrated significant improvement. There is no odor today and the wound is a bit smaller. The periwound skin is in much better condition without maceration. He has been on oral ciprofloxacin and we have applied  topical gentamicin under silver alginate to his wound. 11/13/2021: His wound has responded very well to the topical gentamicin and oral ciprofloxacin. His skin is in better condition and the wound is a good bit smaller. There is minimal slough accumulation and no odor. TheraSkin application #3 is scheduled for today. 11/27/2021: The wound is improving markedly. He had good take of the TheraSkin and the periwound skin is in good condition. He has epithelialized quite a bit of the wound. TheraSkin #4 application scheduled for today. 12/11/2021: The wound continues to contract and is quite a bit smaller. The periwound skin is in good condition and he has epithelialized even more of the previously open portions of his wound. TheraSkin #5 (the last 1) is scheduled for today. 12/25/2021: The wound continues to improve dramatically. He had his last application of TheraSkin 2 weeks ago. The periwound skin is in good condition and there is evidence of substantial epithelialization. Patient History Information obtained from Patient. Family History Unknown History. Social History Current every day smoker, Marital Status - Married, Alcohol Use - Rarely, Drug Use - No History, Caffeine Use - Moderate. Medical A Surgical History Notes nd Gastrointestinal Chronic Gastritis Objective Constitutional No acute distress. Vitals Time Taken: 10:25 AM, Weight: 170 lbs, Source: Stated, Temperature: 98.1 F, Pulse: 74 bpm, Respiratory Rate: 18 breaths/min, Blood Pressure: 121/74 mmHg. Respiratory Normal work of breathing on room air. General Notes: 12/25/2021: The wound continues to  contract. There is substantial epithelialization present. Underneath the old TheraSkin, the wound surface is a little bit better, particularly on the more anterior portion of the wound. There is still a fibrotic component on the more posterior aspect. Integumentary (Hair, Skin) Wound #1 status is Open. Original cause of wound was Trauma. The date acquired was: 10/14/2020. The wound has been in treatment 46 weeks. The wound is located on the Left Ankle. The wound measures 1.6cm length x 5cm width x 0.1cm depth; 6.283cm^2 area and 0.628cm^3 volume. There is Fat Layer (Subcutaneous Tissue) exposed. There is no tunneling or undermining noted. There is a medium amount of serosanguineous drainage noted. There is medium (34-66%) red granulation within the wound bed. There is a medium (34-66%) amount of necrotic tissue within the wound bed including Adherent Slough. 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 There was a Selective/Open Wound Non-Viable Tissue Debridement with a total area of 8 sq cm performed by [name], MD. With the following instrument(s): Curette to remove Non-Viable tissue/material. Material removed includes Eschar and Slough and after achieving pain control using Other (Benzocaine 20%). No specimens were taken. A time out was conducted at 10:47, prior to the start of the procedure. A Minimum amount of bleeding was controlled with Pressure. The procedure was tolerated well with a pain level of 0 throughout and a pain level of 0 following the procedure. Post Debridement Measurements: 1.6cm length x 5cm width x 0.1cm depth; 0.628cm^3 volume. Character of Wound/Ulcer Post Debridement is improved. Post procedure Diagnosis Wound  #1: Same as Pre-Procedure Pre-procedure diagnosis of Wound #1 is a Trauma, Other located on the Left Ankle . There was a Three Layer Compression Therapy Procedure by Dellie Catholic, RN. Post procedure Diagnosis Wound #1: Same as Pre-Procedure Plan Follow-up Appointments: Return Appointment in 2 weeks. - Dr [name] Room 3 Other: -  interpreter required Bathing/ Shower/ Hygiene: May shower with protection but do not get wound dressing(s) wet. Edema Control - Lymphedema / SCD / Other: Elevate legs to the level of the heart or above for 30 minutes daily and/or when sitting, a frequency of: - 3-4 times a day throughout the day. Avoid standing for long periods of time. Exercise regularly Off-Loading: Open toe surgical shoe to: - left foot WOUND #1: - Ankle Wound Laterality: Left Cleanser: Soap and Water 1 x Per Week/30 Days Discharge Instructions: May shower and wash wound with dial antibacterial soap and water prior to dressing change. Cleanser: Wound Cleanser 1 x Per Week/30 Days Discharge Instructions: Cleanse the wound with wound cleanser prior to applying a clean dressing using gauze sponges, not tissue or cotton balls. Peri-Wound Care: Sween Lotion (Moisturizing lotion) 1 x Per Week/30 Days Discharge Instructions: Apply moisturizing lotion as directed Prim Dressing: Endoform 2x2 in 1 x Per Week/30 Days ary Discharge Instructions: Moisten with saline Secondary Dressing: ABD Pad, 8x10 1 x Per Week/30 Days Discharge Instructions: Apply over primary dressing as directed. Secondary Dressing: Zetuvit Plus 4x8 in 1 x Per Week/30 Days Discharge Instructions: Apply over primary dressing as directed. Com pression Wrap: ThreePress (3 layer compression wrap) 1 x Per Week/30 Days Discharge Instructions: Apply three layer compression as directed. 12/25/2021: The wound continues to contract. There is substantial epithelialization present. Underneath the old TheraSkin, the wound surface is a little bit  better, particularly on the more anterior portion of the wound. There is still a fibrotic component on the more posterior aspect. I debrided some eschar, slough, and old TheraSkin from the wound. I am going to use endoform with 3 layer compression. Follow-up in 1 week. Electronic Signature(s) Signed: 12/25/2021 11:08:24 AM By: [name] MD FACS Entered By: [name] on 12/25/2021 11:08:24 -------------------------------------------------------------------------------- HxROS Details Patient Name: Date of Service: [name] NA [place] 12/25/2021 10:15 A M Medical Record Number: [id number] Patient Account Number: [id number] Date of Birth/Sex: Treating RN: 1975-09-09 (46 y.o. Collene Gobble Primary Care Provider: Wells Guiles Other Clinician: Referring Provider: Treating Provider/Extender: [name] in Treatment: 34 Information Obtained From Patient Gastrointestinal Medical History: Past Medical History Notes: Chronic Gastritis Immunizations Pneumococcal Vaccine: Received Pneumococcal Vaccination: No Implantable Devices No devices added Family and Social History Unknown History: Yes; Current every day smoker; Marital Status - Married; Alcohol Use: Rarely; Drug Use: No History; Caffeine Use: Moderate; Financial Concerns: No; Food, Clothing or Shelter Needs: No; Support System Lacking: No; Transportation Concerns: No Engineer, maintenance) Signed: 12/25/2021 12:53:30 PM By: [name] MD FACS Signed: 12/25/2021 3:00:19 PM By: Dellie Catholic RN Entered By: [name] on 12/25/2021 11:06:04 -------------------------------------------------------------------------------- SuperBill Details Patient Name: Date of Service: [name] [name] 12/25/2021 Medical Record Number: [id number] Patient Account Number: [id number] Date of Birth/Sex: Treating RN: 1975/08/13 (46 y.o. Collene Gobble Primary Care Provider: Wells Guiles Other Clinician: Referring Provider: Treating Provider/Extender: [name] in Treatment: 46 Diagnosis Coding ICD-10 Codes Code Description [phone] Non-pressure chronic ulcer of left ankle with other specified severity I87.332 Chronic venous hypertension (idiopathic) with ulcer and inflammation of left lower extremity Facility Procedures CPT4 Code: [id number] Description: [phone] - DEBRIDE WOUND 1ST 20 SQ CM OR < ICD-10 Diagnosis Description L97.328 Non-pressure chronic ulcer of left ankle with other specified severity Modifier: Quantity: 1 Physician Procedures : CPT4 Code Description Modifier [id number] - WC PHYS LEVEL 3 - EST PT 25 ICD-10 Diagnosis Description L97.328 Non-pressure chronic ulcer  of left ankle with other specified severity I87.332 Chronic venous hypertension (idiopathic) with ulcer and  inflammation of left lower extremity Quantity: 1 : [id number] - WC PHYS DEBR WO ANESTH 20 SQ CM ICD-10 Diagnosis Description L97.328 Non-pressure chronic ulcer of left ankle with other specified severity Quantity: 1 Electronic Signature(s) Signed: 12/25/2021 11:08:41 AM By: [name] MD FACS Entered By: [name] on 12/25/2021 11:08:41

## 2021-12-31 DIAGNOSIS — Z419 Encounter for procedure for purposes other than remedying health state, unspecified: Secondary | ICD-10-CM | POA: Diagnosis not present

## 2022-01-01 ENCOUNTER — Encounter (HOSPITAL_BASED_OUTPATIENT_CLINIC_OR_DEPARTMENT_OTHER): Payer: Medicaid Other | Attending: General Surgery | Admitting: General Surgery

## 2022-01-11 ENCOUNTER — Encounter (HOSPITAL_BASED_OUTPATIENT_CLINIC_OR_DEPARTMENT_OTHER): Payer: Medicaid Other | Attending: General Surgery | Admitting: General Surgery

## 2022-01-11 DIAGNOSIS — M85872 Other specified disorders of bone density and structure, left ankle and foot: Secondary | ICD-10-CM | POA: Insufficient documentation

## 2022-01-11 DIAGNOSIS — I87332 Chronic venous hypertension (idiopathic) with ulcer and inflammation of left lower extremity: Secondary | ICD-10-CM | POA: Insufficient documentation

## 2022-01-11 DIAGNOSIS — L97328 Non-pressure chronic ulcer of left ankle with other specified severity: Secondary | ICD-10-CM | POA: Diagnosis not present

## 2022-01-11 DIAGNOSIS — F17210 Nicotine dependence, cigarettes, uncomplicated: Secondary | ICD-10-CM | POA: Diagnosis not present

## 2022-01-11 DIAGNOSIS — I872 Venous insufficiency (chronic) (peripheral): Secondary | ICD-10-CM | POA: Insufficient documentation

## 2022-01-11 DIAGNOSIS — L97322 Non-pressure chronic ulcer of left ankle with fat layer exposed: Secondary | ICD-10-CM | POA: Diagnosis not present

## 2022-01-13 NOTE — Progress Notes (Signed)
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 the left ankle. He had some form of surgery in this area but I cannot get him to tell me whether there is underlying hardware here. 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 I do not really have [name] good sense of the exact progression however he was seen in the ER on 01/29/2021. He had an x-ray done that was negative listed below. He has not been specifically putting anything on this wound although when he was in the ER they prescribed bacitracin he is only been putting gauze. Apparently there is [name] lot of drainage associated with this. CLINICAL DATA: Left ankle swelling and pain. Wound. EXAM: LEFT ANKLE COMPLETE - 3+ VIEW COMPARISON: No prior. FINDINGS: Diffuse soft tissue swelling. Diffuse osteopenia degenerative change. Ossification noted over the high CS number [name]. no acute bony abnormality identified. No evidence of fracture. IMPRESSION: 1. Diffuse osteopenia and degenerative change. No acute abnormality identified. No acute bony abnormality identified. 2. Diffuse soft tissue swelling. No radiopaque foreign body. Past medical  history; left ankle trauma as noted in 2003. The patient is [name] smoker he is not [name] diabetic lives with his wife. 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 He was brought here as [name] refugee 02/11/2021; patient's ulcer is certainly no better today perhaps even more necrotic in the surface. Marked odor [name] lot of drainage which seep down into his normal skin below the ulcer on his lateral heel. X-ray I repeated last time was negative. Culture grew strep agalactiae perhaps not completely well covered by doxycycline that I gave him empirically. 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 Clearly left the [place] with something on the leg that rapidly expanded starting in March. He immigrated to the [place] on 05/22[place]2021. Other issues of importance is he has Medicaid which makes it difficult to get wound care supplies for dressings 7/20; the patient looks somewhat better with less of [name] necrotic surface. The odor is also improved. He is finishing the round of cephalexin I gave him I am not sure if that is the reason this is improved or whether this is all just colonized bacteria. In any case the patient says it is less painful and there appears to be less drainage.  The patient was kindly seen by Dr. Verdie Drown after my conversation with Dr. Algis Liming last week. He has recommended biopsy with histology stain for fungal and AFB. As well as [name] separate sample in saline for AFB culture fungal culture and bacterial culture. [name] separate sample can be sent to the [facility] for molecular testing for mycobacteriaooMycobacterium ulcerans/[place] ulcer I do not believe that this is some of the more atypical ulcers we see including pyoderma gangrenosum /pemphigus. It is quite possible that there is vascular issues here and I have tried to get him in for arterial and venous evaluation. Certainly the latter could be playing [name] primary role. 7/27; patient comes in with [name] wound absolutely  no better. Marked malodor although he missed his appointment earlier this week for [name] dressing change. We still do not have vascular evaluation I ordered arterial and venous. Again there are issues with communication here. He has completed the antibiotics I initially gave him for strep. I thought he was making some improvements but really no improvement in any aspect of this wound today. 8/5; interpreter present over the phone. Patient reports improvement in wound healing. He is currently taking the antibiotics prescribed by Dr. Luciana Axe (infectious disease). He has no issues or complaints today. He denies signs of infection. 03/10/2021 upon evaluation today patient appears to be doing okay in regard to his wound. This is measuring [name] little bit smaller. Does have [name] lot of slough and biofilm noted on the surface of the wound. I do believe that sharp debridement would be of benefit for him. 8/23; 3 and half weeks since I last saw this man. Quite an improvement. I note the biopsy I did was nonspecific stains for Mycobacterium and fungi were negative. He has been following with Dr. Timmothy Euler who is been helpful prescribing clarithromycin and Bactrim. He has now completed this. He also had arterial and venous studies. His arterial study on the right showed an ABI of 1.10 with [name] TBI of 1.08 on the left unfortunately they did not remove the bandages but his TBI was 0.73 which is normal. He also had venous reflux studies these showed evidence of venous reflux at the greater saphenous vein at the saphenofemoral junction as well as the greater saphenous vein proximally in the thigh but no reflux in the calf Things are quite [name] bit better than the last time I saw him although the progress is slow. We have been using silver alginate. 8/30; generally continuing improvement in surface area and condition of the wound surface we have been using Hydrofera Blue under compression. The patient's only complaint through the  [place] interpreter is that he has some degree of itching 9/6; continued improvement in overall surface area down 1 cm in width we have been using Hydrofera Blue. We have interviewed him through [name] [place] interpreter today. He reports no additional issues 9/13 not much change in surface area today. We have been using Hydrofera Blue. He was interviewed through the [place] interpreter today. Still have him under compression. We used Molecu[employer] imaging 9/20; the wound is actually larger in its width. Also noted an odor and drainage. I used Iodoflex last time to help with the debris on the surface. He is not on any antibiotics. We did this interview through the [place] interpreter 9/27; better and with today. Odor and drainage seems better. We use silver alginate last time and that seems to have helped. We used his neighbor his [place] interpreter 10/4; improved length and improved condition  of the wound bed. We have been using silver alginate. We interviewed him through his [place] interpreter. I am going to have vein and vascular look at this including his reflux studies. He came into the clinic with [name] very angry inflamed wound that admitted there for many months. This now looks [name] lot better. He did not have anything in the calf on the left that had significant reflux although he did have it in his thigh. I want to make sure that everything can be done for this man to prevent this from reoccurring He has Medicaid and we might be able to order him [name] TheraSkin for an advanced treatment option. We will look into this. 10/14; patient comes in after [name] 10-day hiatus. Drainage weeping through his wrap. Marked malodor although the surface of the wound does not look so bad and dimensions are about the same. Through the interpreter on the phone he is not complaining of pain 10/20; wound surface covered in fibrinous debris. This is largely on the lateral part of his foot. We interviewed him through [name]  interpreter on the phone [name] little more drainage reported by our nurses. We have been using silver alginate under compression with sit to fit and CarboFlex He has been to see infectious disease Dr. Luciana Axe. Noted that he has been on Bactrim and clarithromycin for possible mycobacterial or other indolent infection. I am not sure if he is still taking antibiotics but these are listed as being discontinued and by infectious disease 10/27; our intake nurse reported large amount of drainage today more than usual. We have been using silver alginate. He still has not seen vein and vascular about the reflux studies I am not sure what the issue is here. He is very itchy under the wound on the left lateral foot The patient comes into clinic concerned that the 1 year of Medicaid that apparently was assigned to him when he entered the [place]. This is now coming to an end. I told him that I thought the best thing to do is the county social services i.e. [facility] social services I am not sure how else to help him with this. We of course will not discharge him which I think was his concern. He does have an appointment with Dr. Myra Gianotti on 11/7 with regards to the reflux studies. 11/8; the patient saw Dr. Myra Gianotti who noted mild at the saphenofemoral junction on the right but he did not feel that the vein was pathologic and he did not feel he would benefit from laser ablation. Suggested continuing to focus on wound care. We are using silver alginate with Bactroban 11/17; wound looks about the same. Still [name] fair amount of drainage here. Although the wound is coming in surface area it still [name] deep wound full-thickness. I am using silver alginate with Bactroban He really applied for Medicaid. Wondering about [name] skin graft. I am uncertain about that right now because of the drainage 12/1; wound is measuring slightly smaller in width. Surface of this looks better. Changed him to [facility] still using topical  Bactroban 12/8; no major change in dimensions although the surface looks excellent we have been using Bactroban and covering Hydrofera Blue. Considering application for TheraSkin if it is available through his version of Medicaid 12/15; nice healthy appearing wound advancing epithelialization 12/22; improvement in surface area using Bactroban under Hydrofera Blue. Originally [name] difficult large wound likely secondary to chronic venous insufficiency 08/06/2021; no major change in surface area. We  are using Bactroban under Hydrofera Blue 08/19/2021; we are using Sorbact with covering calcium alginate and attempt to get [name] better looking wound surface with less debris.Still under compression He is denied for TheraSkin by his version of Medicaid. This is in it self not that surprising 1/26; using Sorbact with covering silver alginate. Surfaces look better except for the lateral part of the left ankle wound. With the efforts of our staff we have him approved for TheraSkin through [facility] [previously we did not run the correct Medicaid version] 2/2; using Sorbact. Unfortunately the patient comes in with [name] large area of necrotic debris very malodorous. No clear surrounding infection. He is approved for TheraSkin but the wound bed just is not ready for that at this point. 2/9; because of the odor and debris last time we did not go ahead with Elgie Collard has [name] $4 affordable co-pay per application]. PCR culture I did last week showed high titers of E. coli moderate titers of Klebsiella and low titers of Pseudomonas Peptostreptococcus which is anaerobic. Does not have evidence of surrounding infection I have therefore elected to treat this with topical gentamicin under the silver alginate. Also with aggressive debridement 2/16; I'[name] using topical gentamicin to cover the culture gram negatives under silver alginate. Where making nice progress on this wound. I'[name] still have the thorough skin in reserve but I'[name] not  ready to apply that next week perhaps ordero He still requiring debridement but overall the wound surfaces look [name] lot better 09/25/2021: I reviewed old images and I am truly impressed with the significant improvement over time. He is still getting topical gentamicin under silver alginate with 3 layer compression. There has been substantial epithelialization. Drainage has improved and is significantly less. There is still some slough at the base, granulation tissue is forming. I think he is likely to be ready for TheraSkin application next week. 10/02/2021: There is just [name] minimal amount of slough present that was easily removed with [name] curette. Granulation tissue was present. TheraSkin and TheraSkin representative are on site for placement today. 10/16/2021: TheraSkin #1 application was done 2 weeks ago. I saw the wound when he came in for his 1 week follow-up check. All appeared to be progressing as expected. T oday, there is fairly good integration of the TheraSkin with granulation tissue beginning to but up through the fenestrations. There was [name] little bit of loss at the part of the wound over his dorsal foot and at the most lateral aspect by his malleolus, but the rest was fairly well adherent. 10/23/2021: TheraSkin #2 application was done last week. He was here today for [name] nurse visit, but when the dressing was taken down, blue-green staining typical of Pseudomonas was appreciated. The entire foot was quite macerated. The nurse called me into the room to evaluate. 10/30/2021: Last week, there was significant breakdown of the periwound skin and substantial drainage and odor. The drainage was blue-green, suggestive of Pseudomonas aeruginosa. We changed his dressing to silver alginate over topical gentamicin. We canceled the order for TheraSkin #3. T oday, he continues to have substantial drainage and his skin is again, quite macerated. There is an increase in the periwound erythema and the previously closed  bridge of skin between the dorsum of his foot and his malleolus has reopened. The TheraSkin itself remained fairly adherent and there are some buds of granulation tissue coming through the fenestrations. The wound is malodorous today. 11/06/2021: Over the the past week the wound has demonstrated significant improvement.  There is no odor today and the wound is [name] bit smaller. The periwound skin is in much better condition without maceration. He has been on oral ciprofloxacin and we have applied topical gentamicin under silver alginate to his wound. 11/13/2021: His wound has responded very well to the topical gentamicin and oral ciprofloxacin. His skin is in better condition and the wound is [name] good bit smaller. There is minimal slough accumulation and no odor. TheraSkin application #3 is scheduled for today. 11/27/2021: The wound is improving markedly. He had good take of the TheraSkin and the periwound skin is in good condition. He has epithelialized quite [name] bit of the wound. TheraSkin #4 application scheduled for today. 12/11/2021: The wound continues to contract and is quite [name] bit smaller. The periwound skin is in good condition and he has epithelialized even more of the previously open portions of his wound. TheraSkin #5 (the last 1) is scheduled for today. 12/25/2021: The wound continues to improve dramatically. He had his last application of TheraSkin 2 weeks ago. The periwound skin is in good condition and there is evidence of substantial epithelialization. 01/11/2022: The patient did not make his appointment last week. T oday, the anterior portion of the wound is nearly closed with just [name] thin layer of eschar overlying the surface. The more lateral part is quite [name] bit smaller. Although the surface remains gritty and fibrous, it continues to epithelialize. Patient History Information obtained from Patient. Family History Unknown History. Social History Current every day smoker, Marital Status -  Married, Alcohol Use - Rarely, Drug Use - No History, Caffeine Use - Moderate. Medical [name] Surgical History Notes nd Gastrointestinal Chronic Gastritis Objective Constitutional No acute distress.. Vitals Time Taken: 11:17 AM, Weight: 170 lbs, Temperature: 98.4 F, Pulse: 80 bpm, Respiratory Rate: 16 breaths/min, Blood Pressure: 113/78 mmHg. Respiratory Normal work of breathing on room air.. General Notes: 01/11/2022: the anterior portion of the wound is nearly closed with just [name] thin layer of eschar overlying the surface. The more lateral part is quite [name] bit smaller. Although the surface remains gritty and fibrous, it continues to epithelialize. Integumentary (Hair, Skin) Wound #1 status is Open. Original cause of wound was Trauma. The date acquired was: 10/14/2020. The wound has been in treatment 48 weeks. The wound is located on the Left Ankle. The wound measures 1.2cm length x 2.4cm width x 0.2cm depth; 2.262cm^2 area and 0.452cm^3 volume. There is Fat Layer (Subcutaneous Tissue) exposed. There is no tunneling or undermining noted. There is [name] medium amount of serosanguineous drainage noted. The wound margin is flat and intact. There is large (67-100%) red, pink granulation within the wound bed. There is [name] small (1-33%) amount of necrotic tissue within the wound bed including Adherent Slough. Assessment Active Problems ICD-10 Non-pressure chronic ulcer of left ankle with other specified severity Chronic venous hypertension (idiopathic) with ulcer and inflammation of left lower extremity Procedures Wound #1 Pre-procedure diagnosis of Wound #1 is [name] Trauma, Other located on the Left Ankle . There was [name] Excisional Skin/Subcutaneous Tissue Debridement with [name] total area of 2.88 sq cm performed by Duanne Guess, MD. With the following instrument(s): Curette to remove Non-Viable tissue/material. Material removed includes Eschar, Subcutaneous Tissue, and Slough. No specimens were taken. [name] time  out was conducted at 11:35, prior to the start of the procedure. [name] Minimum amount of bleeding was controlled with Pressure. The procedure was tolerated well with [name] pain level of 0 throughout and [name] pain level of 0 following the  procedure. Post Debridement Measurements: 1.2cm length x 2.4cm width x 0.2cm depth; 0.452cm^3 volume. Character of Wound/Ulcer Post Debridement is improved. Post procedure Diagnosis Wound #1: Same as Pre-Procedure Pre-procedure diagnosis of Wound #1 is [name] Trauma, Other located on the Left Ankle . There was [name] Three Layer Compression Therapy Procedure by Zandra Abts, RN. Post procedure Diagnosis Wound #1: Same as Pre-Procedure Plan Follow-up Appointments: Return Appointment in 1 week. - Dr. Lady Gary - Room 3 - Wednesday 6/21 at 12:30 pm Other: - interpreter required Bathing/ Shower/ Hygiene: May shower with protection but do not get wound dressing(s) wet. Edema Control - Lymphedema / SCD / Other: Elevate legs to the level of the heart or above for 30 minutes daily and/or when sitting, [name] frequency of: - 3-4 times [name] day throughout the day. Avoid standing for long periods of time. Exercise regularly Off-Loading: Open toe surgical shoe to: - left foot WOUND #1: - Ankle Wound Laterality: Left Cleanser: Soap and Water 1 x Per Week/30 Days Discharge Instructions: May shower and wash wound with dial antibacterial soap and water prior to dressing change. Cleanser: Wound Cleanser 1 x Per Week/30 Days Discharge Instructions: Cleanse the wound with wound cleanser prior to applying [name] clean dressing using gauze sponges, not tissue or cotton balls. Peri-Wound Care: Sween Lotion (Moisturizing lotion) 1 x Per Week/30 Days Discharge Instructions: Apply moisturizing lotion as directed Prim Dressing: Endoform 2x2 in 1 x Per Week/30 Days ary Discharge Instructions: Moisten with saline Secondary Dressing: Zetuvit Plus 4x8 in 1 x Per Week/30 Days Discharge Instructions: Apply over  primary dressing as directed. Com pression Wrap: ThreePress (3 layer compression wrap) 1 x Per Week/30 Days Discharge Instructions: Apply three layer compression as directed. 01/11/2022: the anterior portion of the wound is nearly closed with just [name] thin layer of eschar overlying the surface. The more lateral part is quite [name] bit smaller. Although the surface remains gritty and fibrous, it continues to epithelialize. I used [name] curette to debride the eschar on the anterior portion of the wound. Only [name] tiny area is open underneath this. I also debrided the more posterior portion. We will continue using endoform and compression. Follow-up in 1 week. Electronic Signature(s) Signed: 01/11/2022 11:49:56 AM By: [name] FACS Entered By: Duanne Guess on 01/11/2022 11:49:56 -------------------------------------------------------------------------------- HxROS Details Patient Name: Date of Service: [name], [name] 01/11/2022 11:00 [name] Medical Record Number: [id number] Patient Account Number: [id number] Date of Birth/Sex: Treating RN: [birth year] (46 y.o. [name] Primary Care Provider: [name] Other Clinician: Referring Provider: Treating Provider/Extender: Horton Marshall in Treatment: 3 Information Obtained From Patient Gastrointestinal Medical History: Past Medical History Notes: Chronic Gastritis Immunizations Pneumococcal Vaccine: Received Pneumococcal Vaccination: No Implantable Devices No devices added Family and Social History Unknown History: Yes; Current every day smoker; Marital Status - Married; Alcohol Use: Rarely; Drug Use: No History; Caffeine Use: Moderate; Financial Concerns: No; Food, Clothing or Shelter Needs: No; Support System Lacking: No; Transportation Concerns: No Psychologist, prison and probation services) Signed: 01/11/2022 3:22:33 PM By: [name] FACS Signed: 01/12/2022 5:11:17 PM By: Karie Schwalbe RN Entered By:  Duanne Guess on 01/11/2022 11:44:46 -------------------------------------------------------------------------------- SuperBill Details Patient Name: Date of Service: [name] 01/11/2022 Medical Record Number: [id number] Patient Account Number: [id number] Date of Birth/Sex: Treating RN: [birth year] (46 y.o. [name] Primary Care Provider: [name] Other Clinician: Referring Provider: Treating Provider/Extender: Horton Marshall in Treatment: 48 Diagnosis Coding ICD-10 Codes Code Description [phone]  Non-pressure chronic ulcer of left ankle with other specified severity I87.332 Chronic venous hypertension (idiopathic) with ulcer and inflammation of left lower extremity Facility Procedures CPT4 Code: [id number] Description: 11042 - DEB SUBQ TISSUE 20 SQ CM/< ICD-10 Diagnosis Description L97.328 Non-pressure chronic ulcer of left ankle with other specified severity Modifier: Quantity: 1 Physician Procedures : CPT4 Code Description Modifier [id number] - WC PHYS LEVEL 3 - EST PT 25 ICD-10 Diagnosis Description L97.328 Non-pressure chronic ulcer of left ankle with other specified severity I87.332 Chronic venous hypertension (idiopathic) with ulcer and  inflammation of left lower extremity Quantity: 1 : [id number] 11042 - WC PHYS SUBQ TISS 20 SQ CM ICD-10 Diagnosis Description L97.328 Non-pressure chronic ulcer of left ankle with other specified severity Quantity: 1 Electronic Signature(s) Signed: 01/11/2022 11:50:15 AM By: [name] FACS Entered By: Duanne Guess on 01/11/2022 11:50:15

## 2022-01-13 NOTE — Progress Notes (Signed)
[name], [name] ([id number]) Visit Report for 01/11/2022 Arrival Information Details Patient Name: Date of Service: [name] [name] 01/11/2022 11:00 A M Medical Record Number: [id number] Patient Account Number: [id number] Date of Birth/Sex: Treating RN: 11/07/1975 (46 y.o. [name] Primary Care Overton Boggus: Wells Guiles Other Clinician: Referring Jazline Cumbee: Treating Bohden Dung/Extender: [name] in Treatment: 94 Visit Information History Since Last Visit Added or deleted any medications: No Patient Arrived: Ambulatory Any new allergies or adverse reactions: No Arrival Time: 11:17 Had a fall or experienced change in No Accompanied By: alone activities of daily living that may affect Transfer Assistance: None risk of falls: Patient Identification Verified: Yes Signs or symptoms of abuse/neglect since last visito No Secondary Verification Process Completed: Yes Hospitalized since last visit: No Patient Requires Transmission-Based Precautions: No Implantable device outside of the clinic excluding No Patient Has Alerts: No cellular tissue based products placed in the center since last visit: Has Dressing in Place as Prescribed: Yes Has Compression in Place as Prescribed: Yes Pain Present Now: No Electronic Signature(s) Signed: 01/13/2022 6:00:02 PM By: Levan Hurst RN, BSN Entered By: Levan Hurst on 01/11/2022 11:18:19 -------------------------------------------------------------------------------- Compression Therapy Details Patient Name: Date of Service: [name] NA [place] 01/11/2022 11:00 A M Medical Record Number: [id number] Patient Account Number: [id number] Date of Birth/Sex: Treating RN: 1976-04-28 (46 y.o. [name] Primary Care Kahil Agner: Wells Guiles Other Clinician: Referring Jenan Ellegood: Treating Wayland Baik/Extender: [name] in Treatment: 2 Compression Therapy Performed for Wound  Assessment: Wound #1 Left Ankle Performed By: Clinician Levan Hurst, RN Compression Type: Three Layer Post Procedure Diagnosis Same as Pre-procedure Electronic Signature(s) Signed: 01/13/2022 6:00:02 PM By: Levan Hurst RN, BSN Entered By: Levan Hurst on 01/11/2022 11:45:05 -------------------------------------------------------------------------------- Encounter Discharge Information Details Patient Name: Date of Service: [name] NA [place] 01/11/2022 11:00 [place] Record Number: [id number] Patient Account Number: [id number] Date of Birth/Sex: Treating RN: 05-15-1976 (46 y.o. [name] Primary Care Alpha Mysliwiec: Wells Guiles Other Clinician: Referring Keela Rubert: Treating Quince Santana/Extender: [name] in Treatment: 2 Encounter Discharge Information Items Post Procedure Vitals Discharge Condition: Stable Temperature (F): 98.4 Ambulatory Status: Ambulatory Pulse (bpm): 80 Discharge Destination: Home Respiratory Rate (breaths/min): 16 Transportation: Private Auto Blood Pressure (mmHg): 113/78 Accompanied By: alone Schedule Follow-up Appointment: Yes Clinical Summary of Care: Patient Declined Electronic Signature(s) Signed: 01/13/2022 6:00:02 PM By: Levan Hurst RN, BSN Entered By: Levan Hurst on 01/11/2022 17:09:03 -------------------------------------------------------------------------------- Lower Extremity Assessment Details Patient Name: Date of Service: [name] NA [place] 01/11/2022 11:00 A M Medical Record Number: [id number] Patient Account Number: [id number] Date of Birth/Sex: Treating RN: 1975-09-21 (46 y.o. [name] Primary Care Amanda Pote: Wells Guiles Other Clinician: Referring Brodey Bonn: Treating Zanyla Klebba/Extender: [name] in Treatment: 48 Edema Assessment Assessed: Shirlyn Goltz: No] [name]: No] Edema: [Left: Ye] [Right: s] Calf Left: Right: Point of Measurement: 28 cm  From Medial Instep 30.5 cm Ankle Left: Right: Point of Measurement: 8 cm From Medial Instep 22.5 cm Vascular Assessment Pulses: Dorsalis Pedis Palpable: [Left:Yes] Electronic Signature(s) Signed: 01/13/2022 6:00:02 PM By: Levan Hurst RN, BSN Entered By: Levan Hurst on 01/11/2022 11:27:54 -------------------------------------------------------------------------------- Multi Wound Chart Details Patient Name: Date of Service: [name] NA [place] 01/11/2022 11:00 A M Medical Record Number: [id number] Patient Account Number: [id number] Date of Birth/Sex: Treating RN: [birth year] (46 y.o. Collene Gobble Primary Care Jakory Matsuo: Wells Guiles Other Clinician: Referring Yamilee Harmes: Treating Helaine Yackel/Extender: [name] in Treatment: 56 Vital Signs  Height(in): Pulse(bpm): 80 Weight(lbs): 170 Blood Pressure(mmHg): 113/78 Body Mass Index(BMI): Temperature(F): 98.4 Respiratory Rate(breaths/min): 16 Photos: [N/A:N/A] Left Ankle N/A N/A Wound Location: Trauma N/A N/A Wounding Event: Trauma, Other N/A N/A Primary Etiology: 10/14/2020 N/A N/A Date Acquired: 61 N/A N/A Weeks of Treatment: Open N/A N/A Wound Status: No N/A N/A Wound Recurrence: 1.2x2.4x0.2 N/A N/A Measurements L x W x D (cm) 2.262 N/A N/A A (cm) : rea 0.452 N/A N/A Volume (cm) : 96.30% N/A N/A % Reduction in A rea: 98.20% N/A N/A % Reduction in Volume: Full Thickness Without Exposed N/A N/A Classification: Support Structures Medium N/A N/A Exudate A mount: Serosanguineous N/A N/A Exudate Type: red, brown N/A N/A Exudate Color: Flat and Intact N/A N/A Wound Margin: Large (67-100%) N/A N/A Granulation A mount: Red, Pink N/A N/A Granulation Quality: Small (1-33%) N/A N/A Necrotic A mount: Fat Layer (Subcutaneous Tissue): Yes N/A N/A Exposed Structures: Fascia: No Tendon: No Muscle: No Joint: No Bone: No Medium (34-66%) N/A  N/A Epithelialization: Debridement - Excisional N/A N/A Debridement: Pre-procedure Verification/Time Out 11:35 N/A N/A Taken: Necrotic/Eschar, Subcutaneous, N/A N/A Tissue Debrided: Slough Skin/Subcutaneous Tissue N/A N/A Level: 2.88 N/A N/A Debridement A (sq cm): rea Curette N/A N/A Instrument: Minimum N/A N/A Bleeding: Pressure N/A N/A Hemostasis Achieved: 0 N/A N/A Procedural Pain: 0 N/A N/A Post Procedural Pain: Debridement Treatment Response: Procedure was tolerated well N/A N/A Post Debridement Measurements L x 1.2x2.4x0.2 N/A N/A W x D (cm) 0.452 N/A N/A Post Debridement Volume: (cm) Debridement N/A N/A Procedures Performed: Treatment Notes Electronic Signature(s) Signed: 01/11/2022 11:43:40 AM By: [name] MD FACS Signed: 01/12/2022 5:11:17 PM By: Dellie Catholic RN Entered By: [name] on 01/11/2022 11:43:40 -------------------------------------------------------------------------------- Multi-Disciplinary Care Plan Details Patient Name: Date of Service: [name] 01/11/2022 11:00 A M Medical Record Number: [id number] Patient Account Number: [id number] Date of Birth/Sex: Treating RN: [birth year] (46 y.o. [name] Primary Care Titania Gault: Wells Guiles Other Clinician: Referring Chijioke Lasser: Treating Jodeen Mclin/Extender: [name] in Treatment: 45 Multidisciplinary Care Plan reviewed with physician Active Inactive Wound/Skin Impairment Nursing Diagnoses: Knowledge deficit related to ulceration/compromised skin integrity Goals: Patient/caregiver will verbalize understanding of skin care regimen Date Initiated: 02/05/2021 Target Resolution Date: 01/29/2022 Goal Status: Active Interventions: Assess patient/caregiver ability to obtain necessary supplies Assess patient/caregiver ability to perform ulcer/skin care regimen upon admission and as needed Provide education on ulcer and skin care Treatment  Activities: Skin care regimen initiated : 02/05/2021 Topical wound management initiated : 02/05/2021 Notes: 03/31/21: Wound care regimen ongoing, target date extended. 04/21/21: Wound care ongoing, through interpreter patient states he is doing fine with his dressing changes. Electronic Signature(s) Signed: 01/13/2022 6:00:02 PM By: Levan Hurst RN, BSN Entered By: Levan Hurst on 01/11/2022 11:32:57 -------------------------------------------------------------------------------- Pain Assessment Details Patient Name: Date of Service: [name] NA [place] 01/11/2022 11:00 A M Medical Record Number: [id number] Patient Account Number: [id number] Date of Birth/Sex: Treating RN: 09-02-1975 (46 y.o. [name] Primary Care Beata Beason: Wells Guiles Other Clinician: Referring Zyia Kaneko: Treating Jaquavis Felmlee/Extender: [name] in Treatment: 23 Active Problems Location of Pain Severity and Description of Pain Patient Has Paino No Site Locations Pain Management and Medication Current Pain Management: Electronic Signature(s) Signed: 01/13/2022 6:00:02 PM By: Levan Hurst RN, BSN Entered By: Levan Hurst on 01/11/2022 11:19:17 -------------------------------------------------------------------------------- Patient/Caregiver Education Details Patient Name: Date of Service: [name] 6/12/2023andnbsp11:00 [place] Record Number: [id number] Patient Account Number: [id number] Date of Birth/Gender: Treating RN: 25-Sep-1975 (46  y.o. [name] Primary Care Physician: Wells Guiles Other Clinician: Referring Physician: Treating Physician/Extender: [name] in Treatment: 61 Education Assessment Education Provided To: Patient Education Topics Provided Wound/Skin Impairment: Methods: Explain/Verbal Responses: State content correctly [employer]) Signed: 01/13/2022 6:00:02 PM By: Levan Hurst  RN, BSN Entered By: Levan Hurst on 01/11/2022 11:33:18 -------------------------------------------------------------------------------- Wound Assessment Details Patient Name: Date of Service: [name] NA [place] 01/11/2022 11:00 A M Medical Record Number: [id number] Patient Account Number: [id number] Date of Birth/Sex: Treating RN: [birth year] (46 y.o. [name] Primary Care Emannuel Vise: Wells Guiles Other Clinician: Referring Zellie Jenning: Treating Cynithia Hakimi/Extender: [name] in Treatment: 24 Wound Status Wound Number: 1 Primary Etiology: Trauma, Other Wound Location: Left Ankle Wound Status: Open Wounding Event: Trauma Date Acquired: 10/14/2020 Weeks Of Treatment: 48 Clustered Wound: No Photos Wound Measurements Length: (cm) 1.2 Width: (cm) 2.4 Depth: (cm) 0.2 Area: (cm) 2.262 Volume: (cm) 0.452 % Reduction in Area: 96.3% % Reduction in Volume: 98.2% Epithelialization: Medium (34-66%) Tunneling: No Undermining: No Wound Description Classification: Full Thickness Without Exposed Support Structures Wound Margin: Flat and Intact Exudate Amount: Medium Exudate Type: Serosanguineous Exudate Color: red, brown Foul Odor After Cleansing: No Slough/Fibrino Yes Wound Bed Granulation Amount: Large (67-100%) Exposed Structure Granulation Quality: Red, Pink Fascia Exposed: No Necrotic Amount: Small (1-33%) Fat Layer (Subcutaneous Tissue) Exposed: Yes Necrotic Quality: Adherent Slough Tendon Exposed: No Muscle Exposed: No Joint Exposed: No Bone Exposed: No Electronic Signature(s) Signed: 01/13/2022 6:00:02 PM By: Levan Hurst RN, BSN Entered By: Levan Hurst on 01/11/2022 11:27:47 -------------------------------------------------------------------------------- Vitals Details Patient Name: Date of Service: [name] [name], [name] 01/11/2022 11:00 A M Medical Record Number: [id number] Patient Account Number: [id number] Date of  Birth/Sex: Treating RN: Nov 20, 1975 (46 y.o. [name] Primary Care Ethel Meisenheimer: Wells Guiles Other Clinician: Referring Rocklyn Mayberry: Treating Jinnifer Montejano/Extender: [name] in Treatment: 48 Vital Signs Time Taken: 11:17 Temperature (F): 98.4 Weight (lbs): 170 Pulse (bpm): 80 Respiratory Rate (breaths/min): 16 Blood Pressure (mmHg): 113/78 Reference Range: 80 - 120 mg / dl Electronic Signature(s) Signed: 01/13/2022 6:00:02 PM By: Levan Hurst RN, BSN Entered By: Levan Hurst on 01/11/2022 11:18:34

## 2022-01-20 ENCOUNTER — Encounter (HOSPITAL_BASED_OUTPATIENT_CLINIC_OR_DEPARTMENT_OTHER): Payer: Medicaid Other | Admitting: General Surgery

## 2022-01-20 DIAGNOSIS — I872 Venous insufficiency (chronic) (peripheral): Secondary | ICD-10-CM | POA: Diagnosis not present

## 2022-01-20 DIAGNOSIS — L97322 Non-pressure chronic ulcer of left ankle with fat layer exposed: Secondary | ICD-10-CM | POA: Diagnosis not present

## 2022-01-20 DIAGNOSIS — L97328 Non-pressure chronic ulcer of left ankle with other specified severity: Secondary | ICD-10-CM | POA: Diagnosis not present

## 2022-01-20 DIAGNOSIS — F17210 Nicotine dependence, cigarettes, uncomplicated: Secondary | ICD-10-CM | POA: Diagnosis not present

## 2022-01-20 DIAGNOSIS — I87332 Chronic venous hypertension (idiopathic) with ulcer and inflammation of left lower extremity: Secondary | ICD-10-CM | POA: Diagnosis not present

## 2022-01-20 DIAGNOSIS — M85872 Other specified disorders of bone density and structure, left ankle and foot: Secondary | ICD-10-CM | POA: Diagnosis not present

## 2022-01-20 NOTE — Progress Notes (Signed)
[name] ([id number]) Visit Report for 01/20/2022 Arrival Information Details Patient Name: Date of Service: [name] [place] NIE 01/20/2022 12:30 PM Medical Record Number: [id number] Patient Account Number: [id number] Date of Birth/Sex: Treating RN: Nov 12, 1975 (46 y.o. [name] Primary Care Lestine Rahe: [name] Other Clinician: Referring Deaira Leckey: Treating Wolfgang Finigan/Extender: Horton Marshall in Treatment: 70 Visit Information History Since Last Visit Added or deleted any medications: No Patient Arrived: Ambulatory Any new allergies or adverse reactions: No Arrival Time: 12:55 Had a fall or experienced change in No Accompanied By: interpreter activities of daily living that may affect Transfer Assistance: Stretcher risk of falls: Patient Requires Transmission-Based Precautions: No Signs or symptoms of abuse/neglect since last visito No Patient Has Alerts: No Implantable device outside of the clinic excluding No cellular tissue based products placed in the center since last visit: Has Dressing in Place as Prescribed: Yes Has Compression in Place as Prescribed: Yes Pain Present Now: No Electronic Signature(s) Signed: 01/20/2022 6:13:31 PM By: Karie Schwalbe RN Entered By: Karie Schwalbe on 01/20/2022 12:58:33 -------------------------------------------------------------------------------- Compression Therapy Details Patient Name: Date of Service: [name] 01/20/2022 12:30 PM Medical Record Number: [id number] Patient Account Number: [id number] Date of Birth/Sex: Treating RN: 10/16/1975 (46 y.o. [name] Primary Care Tea Collums: [name] Other Clinician: Referring Shavon Zenz: Treating Elleigh Cassetta/Extender: Horton Marshall in Treatment: 63 Compression Therapy Performed for Wound Assessment: Wound #1 Left Ankle Performed By: Clinician Karie Schwalbe, RN Compression Type: Three Layer Post  Procedure Diagnosis Same as Pre-procedure Electronic Signature(s) Signed: 01/20/2022 6:13:31 PM By: Karie Schwalbe RN Entered By: Karie Schwalbe on 01/20/2022 13:25:43 -------------------------------------------------------------------------------- Encounter Discharge Information Details Patient Name: Date of Service: [name] 01/20/2022 12:30 PM Medical Record Number: [id number] Patient Account Number: [id number] Date of Birth/Sex: Treating RN: [birth year] (46 y.o. [name] Primary Care Russell Quinney: [name] Other Clinician: Referring Saintclair Schroader: Treating Laylani Pudwill/Extender: Horton Marshall in Treatment: 37 Encounter Discharge Information Items Post Procedure Vitals Discharge Condition: Stable Temperature (F): 98.2 Ambulatory Status: Ambulatory Pulse (bpm): 54 Discharge Destination: Home Respiratory Rate (breaths/min): 14 Transportation: Private Auto Blood Pressure (mmHg): 100/60 Accompanied By: self Schedule Follow-up Appointment: Yes Clinical Summary of Care: Patient Declined Electronic Signature(s) Signed: 01/20/2022 6:13:31 PM By: Karie Schwalbe RN Entered By: Karie Schwalbe on 01/20/2022 18:10:05 -------------------------------------------------------------------------------- Lower Extremity Assessment Details Patient Name: Date of Service: [name] 01/20/2022 12:30 PM Medical Record Number: [id number] Patient Account Number: [id number] Date of Birth/Sex: Treating RN: 06/07/1976 (46 y.o. [name] Primary Care Lando Alcalde: [name] Other Clinician: Referring Nashira Mcglynn: Treating Garlon Tuggle/Extender: Horton Marshall in Treatment: 49 Edema Assessment Assessed: [name]: No] [name]: No] Edema: [Left: Ye] [Right: s] Calf Left: Right: Point of Measurement: 28 cm From Medial Instep 31 cm Ankle Left: Right: Point of Measurement: 8 cm From Medial Instep 221 cm Electronic  Signature(s) Signed: 01/20/2022 6:13:31 PM By: Karie Schwalbe RN Entered By: Karie Schwalbe on 01/20/2022 13:05:54 -------------------------------------------------------------------------------- Multi Wound Chart Details Patient Name: Date of Service: [name] 01/20/2022 12:30 PM Medical Record Number: [id number] Patient Account Number: [id number] Date of Birth/Sex: Treating RN: 06-07-1976 (46 y.o. [name] Primary Care Kassadi Presswood: [name] Other Clinician: Referring Takeyah Wieman: Treating Laakea Pereira/Extender: Horton Marshall in Treatment: 14 Vital Signs Height(in): Pulse(bpm): 53 Weight(lbs): 170 Blood Pressure(mmHg): 100/60 Body Mass Index(BMI): Temperature(F): 98.2 Respiratory Rate(breaths/min): 14 Photos: [N/A:N/A] Left Ankle N/A N/A Wound Location: Trauma N/A N/A Wounding Event: Trauma,  Other N/A N/A Primary Etiology: 10/14/2020 N/A N/A Date Acquired: 58 N/A N/A Weeks of Treatment: Open N/A N/A Wound Status: No N/A N/A Wound Recurrence: 0.6x1.2x0.2 N/A N/A Measurements L x W x D (cm) 0.565 N/A N/A A (cm) : rea 0.113 N/A N/A Volume (cm) : 99.10% N/A N/A % Reduction in A rea: 99.50% N/A N/A % Reduction in Volume: Full Thickness Without Exposed N/A N/A Classification: Support Structures Medium N/A N/A Exudate A mount: Serosanguineous N/A N/A Exudate Type: red, brown N/A N/A Exudate Color: Flat and Intact N/A N/A Wound Margin: Large (67-100%) N/A N/A Granulation A mount: Red, Pink N/A N/A Granulation Quality: Small (1-33%) N/A N/A Necrotic A mount: Fat Layer (Subcutaneous Tissue): Yes N/A N/A Exposed Structures: Fascia: No Tendon: No Muscle: No Joint: No Bone: No Medium (34-66%) N/A N/A Epithelialization: Debridement - Excisional N/A N/A Debridement: Pre-procedure Verification/Time Out 13:11 N/A N/A Taken: Other N/A N/A Pain Control: Subcutaneous, Slough N/A N/A Tissue  Debrided: Skin/Subcutaneous Tissue N/A N/A Level: 0.72 N/A N/A Debridement A (sq cm): rea Curette N/A N/A Instrument: Minimum N/A N/A Bleeding: Pressure N/A N/A Hemostasis A chieved: 0 N/A N/A Procedural Pain: 0 N/A N/A Post Procedural Pain: Procedure was tolerated well N/A N/A Debridement Treatment Response: 0.6x1.2x0.2 N/A N/A Post Debridement Measurements L x W x D (cm) 0.113 N/A N/A Post Debridement Volume: (cm) Compression Therapy N/A N/A Procedures Performed: Debridement Treatment Notes Electronic Signature(s) Signed: 01/20/2022 1:27:04 PM By: [name] FACS Signed: 01/20/2022 6:13:31 PM By: Karie Schwalbe RN Entered By: Duanne Guess on 01/20/2022 13:27:04 -------------------------------------------------------------------------------- Multi-Disciplinary Care Plan Details Patient Name: Date of Service: [name] 01/20/2022 12:30 PM Medical Record Number: [id number] Patient Account Number: [id number] Date of Birth/Sex: Treating RN: 05-02-1976 (46 y.o. [name] Primary Care Tita Terhaar: [name] Other Clinician: Referring Ronny Korff: Treating Helios Kohlmann/Extender: Horton Marshall in Treatment: 36 Multidisciplinary Care Plan reviewed with physician Active Inactive Wound/Skin Impairment Nursing Diagnoses: Knowledge deficit related to ulceration/compromised skin integrity Goals: Patient/caregiver will verbalize understanding of skin care regimen Date Initiated: 02/05/2021 Target Resolution Date: 02/26/2022 Goal Status: Active Interventions: Assess patient/caregiver ability to obtain necessary supplies Assess patient/caregiver ability to perform ulcer/skin care regimen upon admission and as needed Provide education on ulcer and skin care Treatment Activities: Skin care regimen initiated : 02/05/2021 Topical wound management initiated : 02/05/2021 Notes: 03/31/21: Wound care regimen ongoing, target date  extended. 04/21/21: Wound care ongoing, through interpreter patient states he is doing fine with his dressing changes. Electronic Signature(s) Signed: 01/20/2022 6:13:31 PM By: Karie Schwalbe RN Entered By: Karie Schwalbe on 01/20/2022 18:07:49 -------------------------------------------------------------------------------- Pain Assessment Details Patient Name: Date of Service: [name] 01/20/2022 12:30 PM Medical Record Number: [id number] Patient Account Number: [id number] Date of Birth/Sex: Treating RN: 11/13/1975 (46 y.o. [name] Primary Care Phoebie Shad: [name] Other Clinician: Referring Norely Schlick: Treating Pinkie Manger/Extender: Horton Marshall in Treatment: 18 Active Problems Location of Pain Severity and Description of Pain Patient Has Paino No Site Locations Pain Management and Medication Current Pain Management: Electronic Signature(s) Signed: 01/20/2022 6:13:31 PM By: Karie Schwalbe RN Entered By: Karie Schwalbe on 01/20/2022 13:05:41 -------------------------------------------------------------------------------- Patient/Caregiver Education Details Patient Name: Date of Service: [name] 6/21/2023andnbsp12:30 PM Medical Record Number: [id number] Patient Account Number: [id number] Date of Birth/Gender: Treating RN: 1976-05-04 (46 y.o. [name] Primary Care Physician: [name] Other Clinician: Referring Physician: Treating Physician/Extender: Horton Marshall in Treatment: 7 Education Assessment Education Provided To:  Patient Education Topics Provided Wound/Skin Impairment: Methods: Explain/Verbal Responses: Return demonstration correctly Electronic Signature(s) Signed: 01/20/2022 6:13:31 PM By: Karie Schwalbe RN Entered By: Karie Schwalbe on 01/20/2022 18:08:28 -------------------------------------------------------------------------------- Wound Assessment  Details Patient Name: Date of Service: [name] 01/20/2022 12:30 PM Medical Record Number: [id number] Patient Account Number: [id number] Date of Birth/Sex: Treating RN: 19-Aug-1975 (46 y.o. [name] Primary Care Tramya Schoenfelder: [name] Other Clinician: Referring Tanay Misuraca: Treating Doyle Tegethoff/Extender: Horton Marshall in Treatment: 70 Wound Status Wound Number: 1 Primary Etiology: Trauma, Other Wound Location: Left Ankle Wound Status: Open Wounding Event: Trauma Date Acquired: 10/14/2020 Weeks Of Treatment: 49 Clustered Wound: No Photos Wound Measurements Length: (cm) 0.6 Width: (cm) 1.2 Depth: (cm) 0.2 Area: (cm) 0.565 Volume: (cm) 0.113 % Reduction in Area: 99.1% % Reduction in Volume: 99.5% Epithelialization: Medium (34-66%) Tunneling: No Undermining: No Wound Description Classification: Full Thickness Without Exposed Support Structures Wound Margin: Flat and Intact Exudate Amount: Medium Exudate Type: Serosanguineous Exudate Color: red, brown Foul Odor After Cleansing: No Slough/Fibrino Yes Wound Bed Granulation Amount: Large (67-100%) Exposed Structure Granulation Quality: Red, Pink Fascia Exposed: No Necrotic Amount: Small (1-33%) Fat Layer (Subcutaneous Tissue) Exposed: Yes Necrotic Quality: Adherent Slough Tendon Exposed: No Muscle Exposed: No Joint Exposed: No Bone Exposed: No Treatment Notes Wound #1 (Ankle) Wound Laterality: Left Cleanser Soap and Water Discharge Instruction: May shower and wash wound with dial antibacterial soap and water prior to dressing change. Wound Cleanser Discharge Instruction: Cleanse the wound with wound cleanser prior to applying a clean dressing using gauze sponges, not tissue or cotton balls. Peri-Wound Care Sween Lotion (Moisturizing lotion) Discharge Instruction: Apply moisturizing lotion as directed Topical Primary Dressing Endoform 2x2 in Discharge Instruction:  Moisten with saline Secondary Dressing Zetuvit Plus 4x8 in Discharge Instruction: Apply over primary dressing as directed. Secured With Compression Wrap ThreePress (3 layer compression wrap) Discharge Instruction: Apply three layer compression as directed. Compression Stockings Add-Ons Electronic Signature(s) Signed: 01/20/2022 6:13:31 PM By: Karie Schwalbe RN Entered By: Karie Schwalbe on 01/20/2022 13:08:27 -------------------------------------------------------------------------------- Vitals Details Patient Name: Date of Service: [name] 01/20/2022 12:30 PM Medical Record Number: [id number] Patient Account Number: [id number] Date of Birth/Sex: Treating RN: March 29, 1976 (46 y.o. [name] Primary Care Nyasiah Moffet: [name] Other Clinician: Referring Mildred Bollard: Treating Marshia Tropea/Extender: Horton Marshall in Treatment: 34 Vital Signs Time Taken: 12:56 Temperature (F): 98.2 Weight (lbs): 170 Pulse (bpm): 53 Respiratory Rate (breaths/min): 14 Blood Pressure (mmHg): 100/60 Reference Range: 80 - 120 mg / dl Electronic Signature(s) Signed: 01/20/2022 6:13:31 PM By: Karie Schwalbe RN Entered By: Karie Schwalbe on 01/20/2022 12:58:56

## 2022-01-20 NOTE — Progress Notes (Signed)
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 whether there is underlying hardware here. 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 I do not really have a good sense of the exact progression however he was seen in the ER on 01/29/2021. He had an x-ray done that was negative listed below. He has not been specifically putting anything on this wound although when he was in the ER they prescribed bacitracin he is only been putting gauze. Apparently there is a lot of drainage associated with this. CLINICAL DATA: Left ankle swelling and pain. Wound. EXAM: LEFT ANKLE COMPLETE - 3+ VIEW COMPARISON: No prior. FINDINGS: Diffuse soft tissue swelling. Diffuse osteopenia degenerative change. Ossification noted over the high CS  number a. no acute bony abnormality identified. No evidence of fracture. IMPRESSION: 1. Diffuse osteopenia and degenerative change. No acute abnormality identified. No acute bony abnormality identified. 2. Diffuse soft tissue swelling. No radiopaque foreign body. Past medical history; left ankle trauma as noted in 2003. The patient is a smoker he is not a diabetic lives with his wife. 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 He was brought here as a refugee 02/11/2021; patient's ulcer is certainly no better today perhaps even more necrotic in the surface. Marked odor a lot of drainage which seep down into his normal skin below the ulcer on his lateral heel. X-ray I repeated last time was negative. Culture grew strep agalactiae perhaps not completely well covered by doxycycline that I gave him empirically. 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 Clearly left the [place] with something on the leg that rapidly expanded starting in March. He immigrated to the [place] on 05/22/2021. Other issues of importance is he has Medicaid which makes it difficult to get wound care supplies for dressings 7/20; the patient looks somewhat better with less of a necrotic surface. The odor is also improved. He is finishing the round of cephalexin I gave him I am not sure if  that is the reason this is improved or whether this is all just colonized bacteria. In any case the patient says it is less painful and there appears to be less drainage. The patient was kindly seen by Dr. Verdie Drown after my conversation with Dr. Algis Liming last week. He has recommended biopsy with histology stain for fungal and AFB. As well as a separate sample in saline for AFB culture fungal culture and bacterial culture. A separate sample can be sent to the [facility] for molecular testing for mycobacteriaooMycobacterium ulcerans/[place] ulcer I do not believe that this is some of the more atypical  ulcers we see including pyoderma gangrenosum /pemphigus. It is quite possible that there is vascular issues here and I have tried to get him in for arterial and venous evaluation. Certainly the latter could be playing a primary role. 7/27; patient comes in with a wound absolutely no better. Marked malodor although he missed his appointment earlier this week for a dressing change. We still do not have vascular evaluation I ordered arterial and venous. Again there are issues with communication here. He has completed the antibiotics I initially gave him for strep. I thought he was making some improvements but really no improvement in any aspect of this wound today. 8/5; interpreter present over the phone. Patient reports improvement in wound healing. He is currently taking the antibiotics prescribed by Dr. Luciana Axe (infectious disease). He has no issues or complaints today. He denies signs of infection. 03/10/2021 upon evaluation today patient appears to be doing okay in regard to his wound. This is measuring a little bit smaller. Does have a lot of slough and biofilm noted on the surface of the wound. I do believe that sharp debridement would be of benefit for him. 8/23; 3 and half weeks since I last saw this man. Quite an improvement. I note the biopsy I did was nonspecific stains for Mycobacterium and fungi were negative. He has been following with Dr. Timmothy Euler who is been helpful prescribing clarithromycin and Bactrim. He has now completed this. He also had arterial and venous studies. His arterial study on the right showed an ABI of 1.10 with a TBI of 1.08 on the left unfortunately they did not remove the bandages but his TBI was 0.73 which is normal. He also had venous reflux studies these showed evidence of venous reflux at the greater saphenous vein at the saphenofemoral junction as well as the greater saphenous vein proximally in the thigh but no reflux in the calf Things are quite a bit better than  the last time I saw him although the progress is slow. We have been using silver alginate. 8/30; generally continuing improvement in surface area and condition of the wound surface we have been using Hydrofera Blue under compression. The patient's only complaint through the [place] interpreter is that he has some degree of itching 9/6; continued improvement in overall surface area down 1 cm in width we have been using Hydrofera Blue. We have interviewed him through a [place] interpreter today. He reports no additional issues 9/13 not much change in surface area today. We have been using Hydrofera Blue. He was interviewed through the [place] interpreter today. Still have him under compression. We used Molecu[employer] imaging 9/20; the wound is actually larger in its width. Also noted an odor and drainage. I used Iodoflex last time to help with the debris on the surface. He is not on any antibiotics. We did this interview through the [place] interpreter 9/27; better and  with today. Odor and drainage seems better. We use silver alginate last time and that seems to have helped. We used his neighbor his [place] interpreter 10/4; improved length and improved condition of the wound bed. We have been using silver alginate. We interviewed him through his [place] interpreter. I am going to have vein and vascular look at this including his reflux studies. He came into the clinic with a very angry inflamed wound that admitted there for many months. This now looks a lot better. He did not have anything in the calf on the left that had significant reflux although he did have it in his thigh. I want to make sure that everything can be done for this man to prevent this from reoccurring He has Medicaid and we might be able to order him a TheraSkin for an advanced treatment option. We will look into this. 10/14; patient comes in after a 10-day hiatus. Drainage weeping through his wrap. Marked malodor although  the surface of the wound does not look so bad and dimensions are about the same. Through the interpreter on the phone he is not complaining of pain 10/20; wound surface covered in fibrinous debris. This is largely on the lateral part of his foot. We interviewed him through a interpreter on the phone A little more drainage reported by our nurses. We have been using silver alginate under compression with sit to fit and CarboFlex He has been to see infectious disease Dr. Luciana Axe. Noted that he has been on Bactrim and clarithromycin for possible mycobacterial or other indolent infection. I am not sure if he is still taking antibiotics but these are listed as being discontinued and by infectious disease 10/27; our intake nurse reported large amount of drainage today more than usual. We have been using silver alginate. He still has not seen vein and vascular about the reflux studies I am not sure what the issue is here. He is very itchy under the wound on the left lateral foot The patient comes into clinic concerned that the 1 year of Medicaid that apparently was assigned to him when he entered the [place]. This is now coming to an end. I told him that I thought the best thing to do is the county social services i.e. [facility] social services I am not sure how else to help him with this. We of course will not discharge him which I think was his concern. He does have an appointment with Dr. Myra Gianotti on 11/7 with regards to the reflux studies. 11/8; the patient saw Dr. Myra Gianotti who noted mild at the saphenofemoral junction on the right but he did not feel that the vein was pathologic and he did not feel he would benefit from laser ablation. Suggested continuing to focus on wound care. We are using silver alginate with Bactroban 11/17; wound looks about the same. Still a fair amount of drainage here. Although the wound is coming in surface area it still a deep wound full-thickness. I am using silver  alginate with Bactroban He really applied for Medicaid. Wondering about a skin graft. I am uncertain about that right now because of the drainage 12/1; wound is measuring slightly smaller in width. Surface of this looks better. Changed him to [facility] still using topical Bactroban 12/8; no major change in dimensions although the surface looks excellent we have been using Bactroban and covering Hydrofera Blue. Considering application for TheraSkin if it is available through his version of Medicaid 12/15; nice healthy appearing  wound advancing epithelialization 12/22; improvement in surface area using Bactroban under Hydrofera Blue. Originally a difficult large wound likely secondary to chronic venous insufficiency 08/06/2021; no major change in surface area. We are using Bactroban under Hydrofera Blue 08/19/2021; we are using Sorbact with covering calcium alginate and attempt to get a better looking wound surface with less debris.Still under compression He is denied for TheraSkin by his version of Medicaid. This is in it self not that surprising 1/26; using Sorbact with covering silver alginate. Surfaces look better except for the lateral part of the left ankle wound. With the efforts of our staff we have him approved for TheraSkin through [facility] [previously we did not run the correct Medicaid version] 2/2; using Sorbact. Unfortunately the patient comes in with a large area of necrotic debris very malodorous. No clear surrounding infection. He is approved for TheraSkin but the wound bed just is not ready for that at this point. 2/9; because of the odor and debris last time we did not go ahead with Elgie Collard has a $4 affordable co-pay per application]. PCR culture I did last week showed high titers of E. coli moderate titers of Klebsiella and low titers of Pseudomonas Peptostreptococcus which is anaerobic. Does not have evidence of surrounding infection I have therefore elected to treat this  with topical gentamicin under the silver alginate. Also with aggressive debridement 2/16; I'm using topical gentamicin to cover the culture gram negatives under silver alginate. Where making nice progress on this wound. I'm still have the thorough skin in reserve but I'm not ready to apply that next week perhaps ordero He still requiring debridement but overall the wound surfaces look a lot better 09/25/2021: I reviewed old images and I am truly impressed with the significant improvement over time. He is still getting topical gentamicin under silver alginate with 3 layer compression. There has been substantial epithelialization. Drainage has improved and is significantly less. There is still some slough at the base, granulation tissue is forming. I think he is likely to be ready for TheraSkin application next week. 10/02/2021: There is just a minimal amount of slough present that was easily removed with a curette. Granulation tissue was present. TheraSkin and TheraSkin representative are on site for placement today. 10/16/2021: TheraSkin #1 application was done 2 weeks ago. I saw the wound when he came in for his 1 week follow-up check. All appeared to be progressing as expected. T oday, there is fairly good integration of the TheraSkin with granulation tissue beginning to but up through the fenestrations. There was a little bit of loss at the part of the wound over his dorsal foot and at the most lateral aspect by his malleolus, but the rest was fairly well adherent. 10/23/2021: TheraSkin #2 application was done last week. He was here today for a nurse visit, but when the dressing was taken down, blue-green staining typical of Pseudomonas was appreciated. The entire foot was quite macerated. The nurse called me into the room to evaluate. 10/30/2021: Last week, there was significant breakdown of the periwound skin and substantial drainage and odor. The drainage was blue-green, suggestive of Pseudomonas  aeruginosa. We changed his dressing to silver alginate over topical gentamicin. We canceled the order for TheraSkin #3. T oday, he continues to have substantial drainage and his skin is again, quite macerated. There is an increase in the periwound erythema and the previously closed bridge of skin between the dorsum of his foot and his malleolus has reopened. The TheraSkin itself  remained fairly adherent and there are some buds of granulation tissue coming through the fenestrations. The wound is malodorous today. 11/06/2021: Over the the past week the wound has demonstrated significant improvement. There is no odor today and the wound is a bit smaller. The periwound skin is in much better condition without maceration. He has been on oral ciprofloxacin and we have applied topical gentamicin under silver alginate to his wound. 11/13/2021: His wound has responded very well to the topical gentamicin and oral ciprofloxacin. His skin is in better condition and the wound is a good bit smaller. There is minimal slough accumulation and no odor. TheraSkin application #3 is scheduled for today. 11/27/2021: The wound is improving markedly. He had good take of the TheraSkin and the periwound skin is in good condition. He has epithelialized quite a bit of the wound. TheraSkin #4 application scheduled for today. 12/11/2021: The wound continues to contract and is quite a bit smaller. The periwound skin is in good condition and he has epithelialized even more of the previously open portions of his wound. TheraSkin #5 (the last 1) is scheduled for today. 12/25/2021: The wound continues to improve dramatically. He had his last application of TheraSkin 2 weeks ago. The periwound skin is in good condition and there is evidence of substantial epithelialization. 01/11/2022: The patient did not make his appointment last week. T oday, the anterior portion of the wound is nearly closed with just a thin layer of eschar overlying the  surface. The more lateral part is quite a bit smaller. Although the surface remains gritty and fibrous, it continues to epithelialize. 01/20/2022: The more distal and anterior portion of the wound has closed completely. The more lateral and proximal part is substantially smaller. There is some slough on the wound surface, but overall things continue to improve nicely. Patient History Information obtained from Patient. Family History Unknown History. Social History Current every day smoker, Marital Status - Married, Alcohol Use - Rarely, Drug Use - No History, Caffeine Use - Moderate. Medical A Surgical History Notes nd Gastrointestinal Chronic Gastritis Objective Constitutional Bradycardic, asymptomatic.Marland Kitchen No acute distress.. Vitals Time Taken: 12:56 PM, Weight: 170 lbs, Temperature: 98.2 F, Pulse: 53 bpm, Respiratory Rate: 14 breaths/min, Blood Pressure: 100/60 mmHg. Respiratory Normal work of breathing on room air.. General Notes: 01/20/2022: The more distal and anterior portion of the wound has closed completely. The more lateral and proximal part is substantially smaller. There is some slough on the wound surface, but overall things continue to improve nicely. Integumentary (Hair, Skin) Wound #1 status is Open. Original cause of wound was Trauma. The date acquired was: 10/14/2020. The wound has been in treatment 49 weeks. The wound is located on the Left Ankle. The wound measures 0.6cm length x 1.2cm width x 0.2cm depth; 0.565cm^2 area and 0.113cm^3 volume. There is Fat Layer (Subcutaneous Tissue) exposed. There is no tunneling or undermining noted. There is a medium amount of serosanguineous drainage noted. The wound margin is flat and intact. There is large (67-100%) red, pink granulation within the wound bed. There is a small (1-33%) amount of necrotic tissue within the wound bed including Adherent Slough. Assessment Active Problems ICD-10 Non-pressure chronic ulcer of left ankle  with other specified severity Chronic venous hypertension (idiopathic) with ulcer and inflammation of left lower extremity Procedures Wound #1 Pre-procedure diagnosis of Wound #1 is a Trauma, Other located on the Left Ankle . There was a Excisional Skin/Subcutaneous Tissue Debridement with a total area of 0.72 sq cm performed  by Duanne Guessannon, Chioma Mukherjee, MD. With the following instrument(s): Curette to remove Non-Viable tissue/material. Material removed includes Subcutaneous Tissue and Slough and after achieving pain control using Other (benzocaine 20%). No specimens were taken. A time out was conducted at 13:11, prior to the start of the procedure. A Minimum amount of bleeding was controlled with Pressure. The procedure was tolerated well with a pain level of 0 throughout and a pain level of 0 following the procedure. Post Debridement Measurements: 0.6cm length x 1.2cm width x 0.2cm depth; 0.113cm^3 volume. Character of Wound/Ulcer Post Debridement is improved. Post procedure Diagnosis Wound #1: Same as Pre-Procedure Pre-procedure diagnosis of Wound #1 is a Trauma, Other located on the Left Ankle . There was a Three Layer Compression Therapy Procedure by Karie SchwalbeScotton, Joanne, RN. Post procedure Diagnosis Wound #1: Same as Pre-Procedure Plan Follow-up Appointments: Return Appointment in 1 week. - Dr. Lady Garyannon - Room 3 - Thursday June 29th at 1:15pm Other: - interpreter required Bathing/ Shower/ Hygiene: May shower with protection but do not get wound dressing(s) wet. Edema Control - Lymphedema / SCD / Other: Elevate legs to the level of the heart or above for 30 minutes daily and/or when sitting, a frequency of: - 3-4 times a day throughout the day. Avoid standing for long periods of time. Exercise regularly Off-Loading: Open toe surgical shoe to: - left foot WOUND #1: - Ankle Wound Laterality: Left Cleanser: Soap and Water 1 x Per Week/30 Days Discharge Instructions: May shower and wash wound with dial  antibacterial soap and water prior to dressing change. Cleanser: Wound Cleanser 1 x Per Week/30 Days Discharge Instructions: Cleanse the wound with wound cleanser prior to applying a clean dressing using gauze sponges, not tissue or cotton balls. Peri-Wound Care: Sween Lotion (Moisturizing lotion) 1 x Per Week/30 Days Discharge Instructions: Apply moisturizing lotion as directed Prim Dressing: Endoform 2x2 in 1 x Per Week/30 Days ary Discharge Instructions: Moisten with saline Secondary Dressing: Zetuvit Plus 4x8 in 1 x Per Week/30 Days Discharge Instructions: Apply over primary dressing as directed. Com pression Wrap: ThreePress (3 layer compression wrap) 1 x Per Week/30 Days Discharge Instructions: Apply three layer compression as directed. 01/20/2022: The more distal and anterior portion of the wound has closed completely. The more lateral and proximal part is substantially smaller. There is some slough on the wound surface, but overall things continue to improve nicely. I used a curette to debride the slough from the open wound surface. We will continue using endoform and 3 layer compression. On previous occasions, the patient has asked me whether or not he could begin to look for work. I think his wound has finally progressed to the point where it would be safe for him to do so. My caveat is that every 2 hours, he will need to take a break to elevate his leg for 15 minutes. We will provide him with a letter to that effect. Follow-up in 1 week. Electronic Signature(s) Signed: 01/20/2022 1:29:36 PM By: Duanne Guessannon, Chet Greenley MD FACS Entered By: Duanne Guessannon, Alonda Weaber on 01/20/2022 13:29:36 -------------------------------------------------------------------------------- HxROS Details Patient Name: Date of Service: [name] [name] MarseillesYISHIMYE, [name] 01/20/2022 12:30 PM Medical Record Number: [id number] Patient Account Number: [id number] Date of Birth/Sex: Treating RN: 05/09/1976 (46 y.o. [name] LimboM) Scotton, Joanne Primary  Care Provider: Shelby Mattocksahbura, [name] Other Clinician: Referring Provider: Treating Provider/Extender: Horton Marshallannon, Brannon Decaire Dahbura, [name] Weeks in Treatment: 4249 Information Obtained From Patient Gastrointestinal Medical History: Past Medical History Notes: Chronic Gastritis Immunizations Pneumococcal Vaccine: Received Pneumococcal Vaccination: No Implantable Devices No devices added  Family and Social History Unknown History: Yes; Current every day smoker; Marital Status - Married; Alcohol Use: Rarely; Drug Use: No History; Caffeine Use: Moderate; Financial Concerns: No; Food, Clothing or Shelter Needs: No; Support System Lacking: No; Transportation Concerns: No Psychologist, prison and probation services) Signed: 01/20/2022 3:36:19 PM By: [name] FACS Signed: 01/20/2022 6:13:31 PM By: Karie Schwalbe RN Entered By: Duanne Guess on 01/20/2022 13:28:02 -------------------------------------------------------------------------------- SuperBill Details Patient Name: Date of Service: [name] 01/20/2022 Medical Record Number: [id number] Patient Account Number: [id number] Date of Birth/Sex: Treating RN: [birth year] (46 y.o. [name] Primary Care Provider: [name] Other Clinician: Referring Provider: Treating Provider/Extender: Horton Marshall in Treatment: 49 Diagnosis Coding ICD-10 Codes Code Description [id number] Non-pressure chronic ulcer of left ankle with other specified severity I87.332 Chronic venous hypertension (idiopathic) with ulcer and inflammation of left lower extremity Facility Procedures Physician Procedures : CPT4 Code Description Modifier [id number] - WC PHYS LEVEL 3 - EST PT 25 ICD-10 Diagnosis Description L97.328 Non-pressure chronic ulcer of left ankle with other specified severity I87.332 Chronic venous hypertension (idiopathic) with ulcer and  inflammation of left lower extremity Quantity: 1 : [id number] 11042 - WC PHYS SUBQ  TISS 20 SQ CM ICD-10 Diagnosis Description L97.328 Non-pressure chronic ulcer of left ankle with other specified severity Quantity: 1 Electronic Signature(s) Signed: 01/20/2022 1:30:17 PM By: [name] FACS Entered By: Duanne Guess on 01/20/2022 13:30:17

## 2022-01-28 ENCOUNTER — Encounter (HOSPITAL_BASED_OUTPATIENT_CLINIC_OR_DEPARTMENT_OTHER): Payer: Medicaid Other | Admitting: General Surgery

## 2022-01-28 DIAGNOSIS — I87332 Chronic venous hypertension (idiopathic) with ulcer and inflammation of left lower extremity: Secondary | ICD-10-CM | POA: Diagnosis not present

## 2022-01-28 DIAGNOSIS — L97328 Non-pressure chronic ulcer of left ankle with other specified severity: Secondary | ICD-10-CM | POA: Diagnosis not present

## 2022-01-28 DIAGNOSIS — I872 Venous insufficiency (chronic) (peripheral): Secondary | ICD-10-CM | POA: Diagnosis not present

## 2022-01-28 DIAGNOSIS — L97322 Non-pressure chronic ulcer of left ankle with fat layer exposed: Secondary | ICD-10-CM | POA: Diagnosis not present

## 2022-01-28 DIAGNOSIS — F17210 Nicotine dependence, cigarettes, uncomplicated: Secondary | ICD-10-CM | POA: Diagnosis not present

## 2022-01-28 DIAGNOSIS — M85872 Other specified disorders of bone density and structure, left ankle and foot: Secondary | ICD-10-CM | POA: Diagnosis not present

## 2022-01-28 NOTE — Progress Notes (Signed)
GIVANNI, STARON ([id number]) Visit Report for 01/28/2022 Arrival Information Details Patient Name: Date of Service: [name] 01/28/2022 1:15 PM Medical Record Number: [id number] Patient Account Number: [id number] Date of Birth/Sex: Treating RN: 1976/07/27 (46 y.o. [name] Primary Care Nashayla Telleria: [name] Other Clinician: Referring Dera Vanaken: Treating Dontay Harm/Extender: Horton Marshall in Treatment: 68 Visit Information History Since Last Visit Added or deleted any medications: No Patient Arrived: Ambulatory Any new allergies or adverse reactions: No Arrival Time: 13:16 Had a fall or experienced change in No Accompanied By: interpreter activities of daily living that may affect Transfer Assistance: None risk of falls: Patient Requires Transmission-Based Precautions: No Signs or symptoms of abuse/neglect since last visito No Patient Has Alerts: No Implantable device outside of the clinic excluding No cellular tissue based products placed in the center since last visit: Has Dressing in Place as Prescribed: Yes Pain Present Now: No Electronic Signature(s) Signed: 01/28/2022 6:12:45 PM By: Karie Schwalbe RN Entered By: Karie Schwalbe on 01/28/2022 13:22:26 -------------------------------------------------------------------------------- Compression Therapy Details Patient Name: Date of Service: [name] 01/28/2022 1:15 PM Medical Record Number: [id number] Patient Account Number: [id number] Date of Birth/Sex: Treating RN: 11-03-1975 (46 y.o. [name] Primary Care Uzair Godley: [name] Other Clinician: Referring Savannaha Stonerock: Treating Sabatino Williard/Extender: Horton Marshall in Treatment: 38 Compression Therapy Performed for Wound Assessment: Wound #1 Left Ankle Performed By: Clinician Karie Schwalbe, RN Compression Type: Three Layer Post Procedure Diagnosis Same as  Pre-procedure Electronic Signature(s) Signed: 01/28/2022 6:12:45 PM By: Karie Schwalbe RN Entered By: Karie Schwalbe on 01/28/2022 13:49:24 -------------------------------------------------------------------------------- Encounter Discharge Information Details Patient Name: Date of Service: [name] 01/28/2022 1:15 PM Medical Record Number: [id number] Patient Account Number: [id number] Date of Birth/Sex: Treating RN: November 08, 1975 (46 y.o. [name] Primary Care Rayssa Atha: [name] Other Clinician: Referring Amadu Schlageter: Treating Taurus Willis/Extender: Horton Marshall in Treatment: 68 Encounter Discharge Information Items Post Procedure Vitals Discharge Condition: Stable Temperature (F): 98.3 Ambulatory Status: Ambulatory Pulse (bpm): 66 Discharge Destination: Home Respiratory Rate (breaths/min): 14 Transportation: Private Auto Blood Pressure (mmHg): 99/65 Accompanied By: self Schedule Follow-up Appointment: Yes Clinical Summary of Care: Patient Declined Electronic Signature(s) Signed: 01/28/2022 6:12:45 PM By: Karie Schwalbe RN Entered By: Karie Schwalbe on 01/28/2022 16:53:45 -------------------------------------------------------------------------------- Lower Extremity Assessment Details Patient Name: Date of Service: [name] 01/28/2022 1:15 PM Medical Record Number: [id number] Patient Account Number: [id number] Date of Birth/Sex: Treating RN: 08/30/1975 (46 y.o. [name] Primary Care Parilee Hally: [name] Other Clinician: Referring Leana Springston: Treating Johany Hansman/Extender: Horton Marshall in Treatment: 51 Edema Assessment Assessed: [name]: No] [name]: No] Edema: [Left: Ye] [Right: s] Calf Left: Right: Point of Measurement: 28 cm From Medial Instep 31 cm Ankle Left: Right: Point of Measurement: 8 cm From Medial Instep 22.1 cm Electronic Signature(s) Signed: 01/28/2022 6:12:45  PM By: Karie Schwalbe RN Entered By: Karie Schwalbe on 01/28/2022 13:26:42 -------------------------------------------------------------------------------- Multi Wound Chart Details Patient Name: Date of Service: [name] 01/28/2022 1:15 PM Medical Record Number: [id number] Patient Account Number: [id number] Date of Birth/Sex: Treating RN: April 09, 1976 (46 y.o. [name] Primary Care Perry Brucato: [name] Other Clinician: Referring Ivianna Notch: Treating Lou Irigoyen/Extender: Horton Marshall in Treatment: 37 Vital Signs Height(in): Pulse(bpm): 66 Weight(lbs): 170 Blood Pressure(mmHg): 99/65 Body Mass Index(BMI): Temperature(F): 98.3 Respiratory Rate(breaths/min): 14 Photos: [N/A:N/A] Left Ankle N/A N/A Wound Location: Trauma N/A N/A Wounding Event: Trauma, Other N/A N/A Primary Etiology: 10/14/2020 N/A  N/A Date Acquired: 63 N/A N/A Weeks of Treatment: Open N/A N/A Wound Status: No N/A N/A Wound Recurrence: 0.6x1.1x0.2 N/A N/A Measurements L x W x D (cm) 0.518 N/A N/A A (cm) : rea 0.104 N/A N/A Volume (cm) : 99.20% N/A N/A % Reduction in A rea: 99.60% N/A N/A % Reduction in Volume: Full Thickness Without Exposed N/A N/A Classification: Support Structures Medium N/A N/A Exudate Amount: Serosanguineous N/A N/A Exudate Type: red, brown N/A N/A Exudate Color: Flat and Intact N/A N/A Wound Margin: Large (67-100%) N/A N/A Granulation Amount: Red, Pink N/A N/A Granulation Quality: Small (1-33%) N/A N/A Necrotic Amount: Fat Layer (Subcutaneous Tissue): Yes N/A N/A Exposed Structures: Fascia: No Tendon: No Muscle: No Joint: No Bone: No Medium (34-66%) N/A N/A Epithelialization: Treatment Notes Electronic Signature(s) Signed: 01/28/2022 1:43:01 PM By: [name] FACS Signed: 01/28/2022 6:12:45 PM By: Karie Schwalbe RN Entered By: Duanne Guess on 01/28/2022  13:43:01 -------------------------------------------------------------------------------- Multi-Disciplinary Care Plan Details Patient Name: Date of Service: [name] 01/28/2022 1:15 PM Medical Record Number: [id number] Patient Account Number: [id number] Date of Birth/Sex: Treating RN: 02/12/1976 (46 y.o. [name] Primary Care Hester Joslin: [name] Other Clinician: Referring Teja Judice: Treating Delorise Hunkele/Extender: Horton Marshall in Treatment: 98 Multidisciplinary Care Plan reviewed with physician Active Inactive Wound/Skin Impairment Nursing Diagnoses: Knowledge deficit related to ulceration/compromised skin integrity Goals: Patient/caregiver will verbalize understanding of skin care regimen Date Initiated: 02/05/2021 Target Resolution Date: 02/26/2022 Goal Status: Active Interventions: Assess patient/caregiver ability to obtain necessary supplies Assess patient/caregiver ability to perform ulcer/skin care regimen upon admission and as needed Provide education on ulcer and skin care Treatment Activities: Skin care regimen initiated : 02/05/2021 Topical wound management initiated : 02/05/2021 Notes: 03/31/21: Wound care regimen ongoing, target date extended. 04/21/21: Wound care ongoing, through interpreter patient states he is doing fine with his dressing changes. Electronic Signature(s) Signed: 01/28/2022 6:12:45 PM By: Karie Schwalbe RN Entered By: Karie Schwalbe on 01/28/2022 16:51:34 -------------------------------------------------------------------------------- Pain Assessment Details Patient Name: Date of Service: [name] 01/28/2022 1:15 PM Medical Record Number: [id number] Patient Account Number: [id number] Date of Birth/Sex: Treating RN: 1976-06-24 (46 y.o. [name] Primary Care Aragorn Recker: [name] Other Clinician: Referring Octavio Matheney: Treating Rhylen Pulido/Extender: Horton Marshall in Treatment: 57 Active Problems Location of Pain Severity and Description of Pain Patient Has Paino No Site Locations Pain Management and Medication Current Pain Management: Electronic Signature(s) Signed: 01/28/2022 6:12:45 PM By: Karie Schwalbe RN Entered By: Karie Schwalbe on 01/28/2022 13:26:31 -------------------------------------------------------------------------------- Patient/Caregiver Education Details Patient Name: Date of Service: [name] 6/29/2023andnbsp1:15 PM Medical Record Number: [id number] Patient Account Number: [id number] Date of Birth/Gender: Treating RN: [birth year] (46 y.o. [name] Primary Care Physician: [name] Other Clinician: Referring Physician: Treating Physician/Extender: Horton Marshall in Treatment: 8 Education Assessment Education Provided To: Patient Education Topics Provided Wound/Skin Impairment: Methods: Explain/Verbal Responses: Return demonstration correctly Electronic Signature(s) Signed: 01/28/2022 6:12:45 PM By: Karie Schwalbe RN Entered By: Karie Schwalbe on 01/28/2022 16:52:06 -------------------------------------------------------------------------------- Wound Assessment Details Patient Name: Date of Service: [name] 01/28/2022 1:15 PM Medical Record Number: [id number] Patient Account Number: [id number] Date of Birth/Sex: Treating RN: 25-Mar-1976 (46 y.o. [name] Primary Care Brie Eppard: [name] Other Clinician: Referring Rehmat Murtagh: Treating Sanaa Zilberman/Extender: Horton Marshall in Treatment: 51 Wound Status Wound Number: 1 Primary Etiology: Trauma, Other Wound Location: Left Ankle Wound Status: Open Wounding Event: Trauma Date Acquired: 10/14/2020 Tania Ade  Of Treatment: 51 Clustered Wound: No Photos Wound Measurements Length: (cm) 0.6 Width: (cm) 1.1 Depth: (cm) 0.2 Area: (cm) 0.518 Volume: (cm)  0.104 % Reduction in Area: 99.2% % Reduction in Volume: 99.6% Epithelialization: Medium (34-66%) Tunneling: No Undermining: No Wound Description Classification: Full Thickness Without Exposed Support Structures Wound Margin: Flat and Intact Exudate Amount: Medium Exudate Type: Serosanguineous Exudate Color: red, brown Foul Odor After Cleansing: No Slough/Fibrino Yes Wound Bed Granulation Amount: Large (67-100%) Exposed Structure Granulation Quality: Red, Pink Fascia Exposed: No Necrotic Amount: Small (1-33%) Fat Layer (Subcutaneous Tissue) Exposed: Yes Necrotic Quality: Adherent Slough Tendon Exposed: No Muscle Exposed: No Joint Exposed: No Bone Exposed: No Treatment Notes Wound #1 (Ankle) Wound Laterality: Left Cleanser Soap and Water Discharge Instruction: May shower and wash wound with dial antibacterial soap and water prior to dressing change. Wound Cleanser Discharge Instruction: Cleanse the wound with wound cleanser prior to applying a clean dressing using gauze sponges, not tissue or cotton balls. Peri-Wound Care Sween Lotion (Moisturizing lotion) Discharge Instruction: Apply moisturizing lotion as directed Topical Primary Dressing Endoform 2x2 in Discharge Instruction: Moisten with saline Secondary Dressing Zetuvit Plus 4x8 in Discharge Instruction: Apply over primary dressing as directed. Secured With Compression Wrap ThreePress (3 layer compression wrap) Discharge Instruction: Apply three layer compression as directed. Compression Stockings Add-Ons Electronic Signature(s) Signed: 01/28/2022 6:12:45 PM By: Dellie Catholic RN Entered By: Dellie Catholic on 01/28/2022 13:36:29 -------------------------------------------------------------------------------- Vitals Details Patient Name: Date of Service: [name] NA [place] 01/28/2022 1:15 PM Medical Record Number: [id number] Patient Account Number: [id number] Date of Birth/Sex: Treating RN: [birth year] (46  y.o. Collene Gobble Primary Care Naheim Burgen: Wells Guiles Other Clinician: Referring Munachimso Palin: Treating Hannah Crill/Extender: [name] in Treatment: 81 Vital Signs Time Taken: 13:21 Temperature (F): 98.3 Weight (lbs): 170 Pulse (bpm): 66 Respiratory Rate (breaths/min): 14 Blood Pressure (mmHg): 99/65 Reference Range: 80 - 120 mg / dl Electronic Signature(s) Signed: 01/28/2022 6:12:45 PM By: Dellie Catholic RN Entered By: Dellie Catholic on 01/28/2022 [id number]
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
## 2022-01-30 DIAGNOSIS — Z419 Encounter for procedure for purposes other than remedying health state, unspecified: Secondary | ICD-10-CM | POA: Diagnosis not present

## 2022-02-05 ENCOUNTER — Encounter (HOSPITAL_BASED_OUTPATIENT_CLINIC_OR_DEPARTMENT_OTHER): Payer: Medicaid Other | Admitting: General Surgery

## 2022-02-12 ENCOUNTER — Encounter (HOSPITAL_BASED_OUTPATIENT_CLINIC_OR_DEPARTMENT_OTHER): Payer: Medicaid Other | Attending: General Surgery | Admitting: Internal Medicine

## 2022-02-19 ENCOUNTER — Encounter (HOSPITAL_BASED_OUTPATIENT_CLINIC_OR_DEPARTMENT_OTHER): Payer: Medicaid Other | Attending: General Surgery | Admitting: General Surgery

## 2022-02-19 DIAGNOSIS — I87332 Chronic venous hypertension (idiopathic) with ulcer and inflammation of left lower extremity: Secondary | ICD-10-CM | POA: Diagnosis not present

## 2022-02-19 DIAGNOSIS — L97328 Non-pressure chronic ulcer of left ankle with other specified severity: Secondary | ICD-10-CM | POA: Diagnosis not present

## 2022-02-19 DIAGNOSIS — M85872 Other specified disorders of bone density and structure, left ankle and foot: Secondary | ICD-10-CM | POA: Insufficient documentation

## 2022-02-19 DIAGNOSIS — F17200 Nicotine dependence, unspecified, uncomplicated: Secondary | ICD-10-CM | POA: Diagnosis not present

## 2022-02-19 NOTE — Progress Notes (Signed)
MARVEN, VELEY ([id number]) Visit Report for 02/19/2022 Arrival Information Details Patient Name: Date of Service: [name] [place] NIE 02/19/2022 12:30 PM Medical Record Number: [id number] Patient Account Number: [id number] Date of Birth/Sex: Treating RN: March 05, 1976 (46 y.o. [name], [name] Primary Care Latroy Gaymon: [name] Other Clinician: Referring Mayvis Agudelo: Treating Marcelles Clinard/Extender: Horton Marshall in Treatment: 39 Visit Information History Since Last Visit Added or deleted any medications: No Patient Arrived: Ambulatory Any new allergies or adverse reactions: No Arrival Time: 12:54 Had a fall or experienced change in No Accompanied By: interpreter activities of daily living that may affect Transfer Assistance: None risk of falls: Patient Identification Verified: Yes Signs or symptoms of abuse/neglect since last visito No Secondary Verification Process Completed: Yes Hospitalized since last visit: No Patient Requires Transmission-Based Precautions: No Implantable device outside of the clinic excluding No Patient Has Alerts: No cellular tissue based products placed in the center since last visit: Has Dressing in Place as Prescribed: Yes Has Compression in Place as Prescribed: Yes Pain Present Now: No Electronic Signature(s) Signed: 02/19/2022 6:12:58 PM By: [name], BSN Entered By: Zenaida Deed on 02/19/2022 12:55:31 -------------------------------------------------------------------------------- Compression Therapy Details Patient Name: Date of Service: [name] 02/19/2022 12:30 PM Medical Record Number: [id number] Patient Account Number: [id number] Date of Birth/Sex: Treating RN: Jul 27, 1976 (46 y.o. [name] Primary Care Almyra Birman: [name] Other Clinician: Referring Oswald Pott: Treating Marciana Uplinger/Extender: Horton Marshall in Treatment: 22 Compression Therapy Performed for  Wound Assessment: Wound #1 Left Ankle Performed By: Clinician Zenaida Deed, RN Compression Type: Three Layer Post Procedure Diagnosis Same as Pre-procedure Electronic Signature(s) Signed: 02/19/2022 6:12:58 PM By: [name], BSN Entered By: Zenaida Deed on 02/19/2022 13:11:28 -------------------------------------------------------------------------------- Encounter Discharge Information Details Patient Name: Date of Service: [name] 02/19/2022 12:30 PM Medical Record Number: [id number] Patient Account Number: [id number] Date of Birth/Sex: Treating RN: [birth year] (46 y.o. [name] Primary Care Takeria Marquina: [name] Other Clinician: Referring Hilmar Moldovan: Treating Mattalyn Anderegg/Extender: Horton Marshall in Treatment: 41 Encounter Discharge Information Items Post Procedure Vitals Discharge Condition: Stable Temperature (F): 97.5 Ambulatory Status: Ambulatory Pulse (bpm): 55 Discharge Destination: Home Respiratory Rate (breaths/min): 18 Transportation: Private Auto Blood Pressure (mmHg): 99/65 Accompanied By: interpreter Schedule Follow-up Appointment: Yes Clinical Summary of Care: Patient Declined Electronic Signature(s) Signed: 02/19/2022 6:12:58 PM By: [name], BSN Entered By: Zenaida Deed on 02/19/2022 13:28:57 -------------------------------------------------------------------------------- Lower Extremity Assessment Details Patient Name: Date of Service: [name] 02/19/2022 12:30 PM Medical Record Number: [id number] Patient Account Number: [id number] Date of Birth/Sex: Treating RN: 06/30/1976 (46 y.o. [name] Primary Care Shamonique Battiste: [name] Other Clinician: Referring Garet Hooton: Treating Marquetta Weiskopf/Extender: Horton Marshall in Treatment: 54 Edema Assessment Assessed: [name]: No] [name]: No] Edema: [Left: Ye] [Right: s] Calf Left: Right: Point of  Measurement: 28 cm From Medial Instep 31 cm Ankle Left: Right: Point of Measurement: 8 cm From Medial Instep 22 cm Vascular Assessment Pulses: Dorsalis Pedis Palpable: [Left:No] Electronic Signature(s) Signed: 02/19/2022 6:12:58 PM By: [name], BSN Entered By: Zenaida Deed on 02/19/2022 13:02:04 -------------------------------------------------------------------------------- Multi Wound Chart Details Patient Name: Date of Service: [name] 02/19/2022 12:30 PM Medical Record Number: [id number] Patient Account Number: [id number] Date of Birth/Sex: Treating RN: [birth year] (46 y.o. [name] Primary Care Latresha Yahr: [name] Other Clinician: Referring Brydan Downard: Treating Shondell Poulson/Extender: Horton Marshall in Treatment: 52 Vital Signs Height(in): Pulse(bpm): 55 Weight(lbs): 170  Blood Pressure(mmHg): 99/65 Body Mass Index(BMI): Temperature(F): 97.5 Respiratory Rate(breaths/min): 18 Photos: [N/A:N/A] Left Ankle N/A N/A Wound Location: Trauma N/A N/A Wounding Event: Trauma, Other N/A N/A Primary Etiology: 10/14/2020 N/A N/A Date Acquired: 84 N/A N/A Weeks of Treatment: Open N/A N/A Wound Status: No N/A N/A Wound Recurrence: 1.2x3.1x0.2 N/A N/A Measurements L x W x D (cm) 2.922 N/A N/A A (cm) : rea 0.584 N/A N/A Volume (cm) : 95.30% N/A N/A % Reduction in A rea: 97.60% N/A N/A % Reduction in Volume: Full Thickness Without Exposed N/A N/A Classification: Support Structures Medium N/A N/A Exudate A mount: Serosanguineous N/A N/A Exudate Type: red, brown N/A N/A Exudate Color: Flat and Intact N/A N/A Wound Margin: Medium (34-66%) N/A N/A Granulation A mount: Red N/A N/A Granulation Quality: Medium (34-66%) N/A N/A Necrotic A mount: Fat Layer (Subcutaneous Tissue): Yes N/A N/A Exposed Structures: Fascia: No Tendon: No Muscle: No Joint: No Bone: No Small (1-33%) N/A  N/A Epithelialization: Debridement - Excisional N/A N/A Debridement: Pre-procedure Verification/Time Out 13:10 N/A N/A Taken: Lidocaine 4% Topical Solution N/A N/A Pain Control: Subcutaneous, Slough N/A N/A Tissue Debrided: Skin/Subcutaneous Tissue N/A N/A Level: 3.72 N/A N/A Debridement A (sq cm): rea Curette N/A N/A Instrument: Minimum N/A N/A Bleeding: Pressure N/A N/A Hemostasis A chieved: 0 N/A N/A Procedural Pain: 0 N/A N/A Post Procedural Pain: Procedure was tolerated well N/A N/A Debridement Treatment Response: 1.2x3.1x0.2 N/A N/A Post Debridement Measurements L x W x D (cm) 0.584 N/A N/A Post Debridement Volume: (cm) Compression Therapy N/A N/A Procedures Performed: Debridement Treatment Notes Electronic Signature(s) Signed: 02/19/2022 1:17:46 PM By: [name] FACS Signed: 02/19/2022 6:12:58 PM By: [name], BSN Entered By: Duanne Guess on 02/19/2022 13:17:46 -------------------------------------------------------------------------------- Multi-Disciplinary Care Plan Details Patient Name: Date of Service: [name] 02/19/2022 12:30 PM Medical Record Number: [id number] Patient Account Number: [id number] Date of Birth/Sex: Treating RN: 10-30-1975 (46 y.o. 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 Compression as ordered Treatment Activities: Therapeutic compression  applied : 02/19/2022 Notes: Wound/Skin Impairment Nursing Diagnoses: Knowledge deficit related to ulceration/compromised skin integrity Goals: Patient/caregiver will verbalize understanding of skin care regimen Date Initiated: 02/05/2021 Target Resolution Date: 02/26/2022 Goal Status: Active Interventions: Assess patient/caregiver ability to obtain necessary supplies Assess patient/caregiver ability to perform ulcer/skin care regimen upon admission and as needed Provide education on ulcer and skin care Treatment Activities: Skin care regimen initiated : 02/05/2021 Topical wound management initiated : 02/05/2021 Notes: 03/31/21: Wound care regimen ongoing, target date extended. 04/21/21: Wound care ongoing, through interpreter patient states he is doing fine with his dressing changes. Electronic Signature(s) Signed: 02/19/2022 6:12:58 PM By: [name], BSN Entered By: Zenaida Deed on 02/19/2022 13:07:13 -------------------------------------------------------------------------------- Pain Assessment Details Patient Name: Date of Service: [name] 02/19/2022 12:30 PM Medical Record Number: [id number] Patient Account Number: [id number] Date of Birth/Sex: Treating RN: October 19, 1975 (46 y.o. [name] Primary Care Cleon Signorelli: [name] Other Clinician: Referring Johannes Everage: Treating Murl Golladay/Extender: Horton Marshall in Treatment: 57 Active Problems Location of Pain Severity and Description of Pain Patient Has Paino No  Site Locations Rate the pain. Current Pain Level: 0 Pain Management and Medication Current Pain Management: Electronic Signature(s) Signed: 02/19/2022 6:12:58 PM By: [name], BSN Entered By: Zenaida Deed on 02/19/2022 12:56:17 -------------------------------------------------------------------------------- Patient/Caregiver Education Details Patient Name: Date of Service: [name]  7/21/2023andnbsp12:30 PM Medical Record Number: [id number] Patient Account Number: [id number] Date of Birth/Gender: Treating RN: 07/08/1976 (46 y.o. [name] Primary Care Physician: [name] Other Clinician: Referring Physician: Treating Physician/Extender: Horton Marshall in Treatment: 28 Education Assessment Education Provided To: Patient Education Topics Provided Venous: Methods: Explain/Verbal Responses: Reinforcements needed, State content correctly Wound/Skin Impairment: Methods: Explain/Verbal Responses: Reinforcements needed, State content correctly Electronic Signature(s) Signed: 02/19/2022 6:12:58 PM By: [name], BSN Entered By: Zenaida Deed on 02/19/2022 13:07:32 -------------------------------------------------------------------------------- Wound Assessment Details Patient Name: Date of Service: [name] 02/19/2022 12:30 PM Medical Record Number: [id number] Patient Account Number: [id number] Date of Birth/Sex: Treating RN: 08/05/1975 (46 y.o. [name] Primary Care Jaymien Landin: [name] Other Clinician: Referring Jawanda Passey: Treating Klare Criss/Extender: Horton Marshall in Treatment: 95 Wound Status Wound Number: 1 Primary Etiology: Trauma, Other Wound Location: Left Ankle Wound Status: Open Wounding Event: Trauma Date Acquired: 10/14/2020 Weeks Of Treatment: 54 Clustered Wound: No Photos Wound Measurements Length: (cm) 1.2 Width: (cm) 3.1 Depth: (cm) 0.2 Area: (cm) 2.922 Volume: (cm) 0.584 % Reduction in Area: 95.3% % Reduction in Volume: 97.6% Epithelialization: Small (1-33%) Tunneling: No Undermining: No Wound Description Classification: Full Thickness Without Exposed Support Structures Wound Margin: Flat and Intact Exudate Amount: Medium Exudate Type: Serosanguineous Exudate Color: red, brown Foul Odor After Cleansing: No Slough/Fibrino  Yes Wound Bed Granulation Amount: Medium (34-66%) Exposed Structure Granulation Quality: Red Fascia Exposed: No Necrotic Amount: Medium (34-66%) Fat Layer (Subcutaneous Tissue) Exposed: Yes Necrotic Quality: Adherent Slough Tendon Exposed: No Muscle Exposed: No Joint Exposed: No Bone Exposed: No Treatment Notes Wound #1 (Ankle) Wound Laterality: Left Cleanser Soap and Water Discharge Instruction: May shower and wash wound with dial antibacterial soap and water prior to dressing change. Wound Cleanser Discharge Instruction: Cleanse the wound with wound cleanser prior to applying a clean dressing using gauze sponges, not tissue or cotton balls. Peri-Wound Care Zinc Oxide Ointment 30g tube Discharge Instruction: Apply Zinc Oxide to periwound with each dressing change Sween Lotion (Moisturizing lotion) Discharge Instruction: Apply moisturizing lotion as directed Topical Primary Dressing KerraCel Ag Gelling Fiber Dressing, 2x2 in (silver alginate) Discharge Instruction: Apply silver alginate to wound bed as instructed Secondary Dressing Zetuvit Plus 4x8 in Discharge Instruction: Apply over primary dressing as directed. Secured With Compression Wrap ThreePress (3 layer compression wrap) Discharge Instruction: Apply three layer compression as directed. Compression Stockings Add-Ons Electronic Signature(s) Signed: 02/19/2022 6:12:58 PM By: [name], BSN Entered By: Zenaida Deed on 02/19/2022 13:04:53 -------------------------------------------------------------------------------- Vitals Details Patient Name: Date of Service: [name] 02/19/2022 12:30 PM Medical Record Number: [id number] Patient Account Number: [id number] Date of Birth/Sex: Treating RN: 1975/10/27 (46 y.o. [name] Primary Care Nyle Limb: [name] Other Clinician: Referring Zoee Heeney: Treating Verdis Koval/Extender: Horton Marshall in Treatment:  49 Vital Signs Time Taken: 12:51 Temperature (F): 97.5 Weight (lbs): 170 Pulse (bpm): 55 Respiratory Rate (breaths/min): 18 Blood Pressure (mmHg): 99/65 Reference Range: 80 - 120 mg / dl Electronic Signature(s) Signed: 02/19/2022 6:12:58 PM By: [name], BSN Entered By: Zenaida Deed on 02/19/2022 12:56:10

## 2022-02-19 NOTE — Progress Notes (Signed)
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 CPT4 Code Description Modifier [id number] - WC PHYS LEVEL 3 - EST PT 25 ICD-10  Diagnosis Description L97.328 Non-pressure chronic ulcer of left ankle with other specified severity I87.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

## 2022-03-01 ENCOUNTER — Ambulatory Visit (HOSPITAL_BASED_OUTPATIENT_CLINIC_OR_DEPARTMENT_OTHER): Payer: Medicaid Other | Admitting: General Surgery

## 2022-03-02 DIAGNOSIS — Z419 Encounter for procedure for purposes other than remedying health state, unspecified: Secondary | ICD-10-CM | POA: Diagnosis not present

## 2022-03-08 ENCOUNTER — Encounter (HOSPITAL_BASED_OUTPATIENT_CLINIC_OR_DEPARTMENT_OTHER): Payer: Medicaid Other | Attending: General Surgery | Admitting: General Surgery

## 2022-03-08 DIAGNOSIS — L97328 Non-pressure chronic ulcer of left ankle with other specified severity: Secondary | ICD-10-CM | POA: Insufficient documentation

## 2022-03-08 DIAGNOSIS — I87332 Chronic venous hypertension (idiopathic) with ulcer and inflammation of left lower extremity: Secondary | ICD-10-CM | POA: Insufficient documentation

## 2022-03-08 DIAGNOSIS — F17200 Nicotine dependence, unspecified, uncomplicated: Secondary | ICD-10-CM | POA: Insufficient documentation

## 2022-03-09 NOTE — Progress Notes (Signed)
[name], CAUDILLO ([id number]) Visit Report for 03/08/2022 Arrival Information Details Patient Name: Date of Service: [name] [place] NIE 03/08/2022 11:30 A M Medical Record Number: [id number] Patient Account Number: [id number] Date of Birth/Sex: Treating RN: May 06, 1976 (46 y.o. [name] Primary Care Kidus Delman: [name] Other Clinician: Referring Deklen Popelka: Treating Kristalynn Coddington/Extender: Horton Marshall in Treatment: 64 Visit Information History Since Last Visit Added or deleted any medications: No Patient Arrived: Ambulatory Any new allergies or adverse reactions: No Arrival Time: 10:39 Had a fall or experienced change in No Accompanied By: self/Interpreter phone activities of daily living that may affect used risk of falls: Transfer Assistance: None Signs or symptoms of abuse/neglect since last visito No Patient Identification Verified: Yes Hospitalized since last visit: No Patient Requires Transmission-Based No Implantable device outside of the clinic excluding No Precautions: cellular tissue based products placed in the center Patient Has Alerts: No since last visit: Pain Present Now: No Notes The Interpreter phone was used to translate through a Swahili interpreter Electronic Signature(s) Signed: 03/09/2022 5:46:32 PM By: Karie Schwalbe RN Entered By: Karie Schwalbe on 03/09/2022 17:43:50 -------------------------------------------------------------------------------- Compression Therapy Details Patient Name: Date of Service: [name] 03/08/2022 11:30 A M Medical Record Number: [id number] Patient Account Number: [id number] Date of Birth/Sex: Treating RN: [birth year] (46 y.o. [name] Primary Care Alford Gamero: [name] Other Clinician: Referring Luiscarlos Kaczmarczyk: Treating Matilde Pottenger/Extender: Horton Marshall in Treatment: 40 Compression Therapy Performed for Wound Assessment: Wound #1 Left  Ankle Performed By: Clinician Karie Schwalbe, RN Compression Type: Three Layer Electronic Signature(s) Signed: 03/09/2022 5:46:32 PM By: Karie Schwalbe RN Entered By: Karie Schwalbe on 03/09/2022 17:45:00 -------------------------------------------------------------------------------- Encounter Discharge Information Details Patient Name: Date of Service: [name] 03/08/2022 11:30 A M Medical Record Number: [id number] Patient Account Number: [id number] Date of Birth/Sex: Treating RN: 02-06-1976 (46 y.o. [name] Primary Care Vineta Carone: [name] Other Clinician: Referring Bethzaida Boord: Treating Shamaine Mulkern/Extender: Horton Marshall in Treatment: 86 Encounter Discharge Information Items Discharge Condition: Stable Ambulatory Status: Ambulatory Discharge Destination: Home Transportation: Private Auto Accompanied By: self Schedule Follow-up Appointment: Yes Clinical Summary of Care: Patient Declined Electronic Signature(s) Signed: 03/09/2022 5:46:32 PM By: Karie Schwalbe RN Entered By: Karie Schwalbe on 03/09/2022 17:45:54 -------------------------------------------------------------------------------- Patient/Caregiver Education Details Patient Name: Date of Service: [name] 8/7/2023andnbsp11:30 A M Medical Record Number: [id number] Patient Account Number: [id number] Date of Birth/Gender: Treating RN: [birth year] (46 y.o. [name] Primary Care Physician: [name] Other Clinician: Referring Physician: Treating Physician/Extender: Horton Marshall in Treatment: 63 Education Assessment Education Provided To: Patient Education Topics Provided Wound/Skin Impairment: Methods: Explain/Verbal Responses: Return demonstration correctly Electronic Signature(s) Signed: 03/09/2022 5:46:32 PM By: Karie Schwalbe RN Entered By: Karie Schwalbe on 03/09/2022  17:45:39 -------------------------------------------------------------------------------- Wound Assessment Details Patient Name: Date of Service: [name] 03/08/2022 11:30 A M Medical Record Number: [id number] Patient Account Number: [id number] Date of Birth/Sex: Treating RN: 10/27/1975 (46 y.o. [name] Primary Care Emerick Weatherly: [name] Other Clinician: Referring Lizzet Hendley: Treating Eamonn Sermeno/Extender: Horton Marshall in Treatment: 56 Wound Status Wound Number: 1 Primary Etiology: Trauma, Other Wound Location: Left Ankle Wound Status: Open Wounding Event: Trauma Date Acquired: 10/14/2020 Weeks Of Treatment: 56 Clustered Wound: No Wound Measurements Length: (cm) 1.2 Width: (cm) 3.1 Depth: (cm) 0.2 Area: (cm) 2.922 Volume: (cm) 0.584 % Reduction in Area: 95.3% % Reduction in Volume: 97.6% Epithelialization: Small (1-33%) Tunneling: No Undermining: No Wound Description  Classification: Full Thickness Without Exposed Support Structures Wound Margin: Flat and Intact Exudate Amount: Medium Exudate Type: Serosanguineous Exudate Color: red, brown Foul Odor After Cleansing: No Slough/Fibrino Yes Wound Bed Granulation Amount: Medium (34-66%) Exposed Structure Granulation Quality: Red Fascia Exposed: No Necrotic Amount: Medium (34-66%) Fat Layer (Subcutaneous Tissue) Exposed: Yes Necrotic Quality: Adherent Slough Tendon Exposed: No Muscle Exposed: No Joint Exposed: No Bone Exposed: No Treatment Notes Wound #1 (Ankle) Wound Laterality: Left Cleanser Soap and Water Discharge Instruction: May shower and wash wound with dial antibacterial soap and water prior to dressing change. Wound Cleanser Discharge Instruction: Cleanse the wound with wound cleanser prior to applying a clean dressing using gauze sponges, not tissue or cotton balls. Peri-Wound Care Zinc Oxide Ointment 30g tube Discharge Instruction: Apply Zinc Oxide to  periwound with each dressing change Sween Lotion (Moisturizing lotion) Discharge Instruction: Apply moisturizing lotion as directed Topical Primary Dressing KerraCel Ag Gelling Fiber Dressing, 2x2 in (silver alginate) Discharge Instruction: Apply silver alginate to wound bed as instructed Secondary Dressing Zetuvit Plus 4x8 in Discharge Instruction: Apply over primary dressing as directed. Secured With Compression Wrap ThreePress (3 layer compression wrap) Discharge Instruction: Apply three layer compression as directed. Compression Stockings Add-Ons Electronic Signature(s) Signed: 03/09/2022 5:46:32 PM By: Karie Schwalbe RN Entered By: Karie Schwalbe on 03/09/2022 17:44:40 -------------------------------------------------------------------------------- Vitals Details Patient Name: Date of Service: [name] 03/08/2022 11:30 A M Medical Record Number: [id number] Patient Account Number: [id number] Date of Birth/Sex: Treating RN: April 15, 1976 (46 y.o. [name] Primary Care Berlie Hatchel: [name] Other Clinician: Referring Spiro Ausborn: Treating Cheila Wickstrom/Extender: Horton Marshall in Treatment: 52 Vital Signs Time Taken: 10:39 Temperature (F): 98.3 Weight (lbs): 170 Pulse (bpm): 71 Respiratory Rate (breaths/min): 16 Blood Pressure (mmHg): 109/73 Reference Range: 80 - 120 mg / dl Electronic Signature(s) Signed: 03/09/2022 5:46:32 PM By: Karie Schwalbe RN Entered By: Karie Schwalbe on 03/09/2022 17:44:20

## 2022-03-10 NOTE — Progress Notes (Signed)
[name], [name] ([id number]) Visit Report for 03/08/2022 SuperBill Details Patient Name: Date of Service: [name] 03/08/2022 Medical Record Number: [id number] Patient Account Number: [id number] Date of Birth/Sex: Treating RN: 1975-09-23 (46 y.o. [name] Primary Care Provider: [name] Other Clinician: Referring Provider: Treating Provider/Extender: Horton Marshall in Treatment: 56 Diagnosis Coding ICD-10 Codes Code Description [phone] Non-pressure chronic ulcer of left ankle with other specified severity I87.332 Chronic venous hypertension (idiopathic) with ulcer and inflammation of left lower extremity Facility Procedures CPT4 Code Description Modifier Quantity [id number] (Facility Use Only) [phone] - APPLY MULTLAY COMPRS LWR LT LEG 1 Electronic Signature(s) Signed: 03/09/2022 5:46:32 PM By: Karie Schwalbe RN Signed: 03/10/2022 7:33:15 AM By: [name] FACS Entered By: Karie Schwalbe on 03/09/2022 17:46:09

## 2022-03-16 ENCOUNTER — Encounter (HOSPITAL_BASED_OUTPATIENT_CLINIC_OR_DEPARTMENT_OTHER): Payer: Medicaid Other | Admitting: General Surgery

## 2022-03-23 ENCOUNTER — Encounter (HOSPITAL_BASED_OUTPATIENT_CLINIC_OR_DEPARTMENT_OTHER): Payer: Medicaid Other | Admitting: General Surgery

## 2022-03-23 DIAGNOSIS — L97328 Non-pressure chronic ulcer of left ankle with other specified severity: Secondary | ICD-10-CM | POA: Diagnosis not present

## 2022-03-23 DIAGNOSIS — I87332 Chronic venous hypertension (idiopathic) with ulcer and inflammation of left lower extremity: Secondary | ICD-10-CM | POA: Diagnosis not present

## 2022-03-23 DIAGNOSIS — F17200 Nicotine dependence, unspecified, uncomplicated: Secondary | ICD-10-CM | POA: Diagnosis not present

## 2022-03-23 NOTE — Progress Notes (Signed)
[name] ([id number]) Visit Report for 03/23/2022 Arrival Information Details Patient Name: Date of Service: [name] [place] NIE 03/23/2022 11:15 A M Medical Record Number: [id number] Patient Account Number: [id number] Date of Birth/Sex: Treating RN: 07-25-1976 (46 y.o. [name], [name] Primary Care Ceyda Peterka: [name] Other Clinician: Referring Lambert Jeanty: Treating Floella Ensz/Extender: Horton Marshall in Treatment: 62 Visit Information History Since Last Visit Added or deleted any medications: No Patient Arrived: Ambulatory Any new allergies or adverse reactions: No Arrival Time: 11:34 Had a fall or experienced change in No Accompanied By: interpreter activities of daily living that may affect Transfer Assistance: None risk of falls: Patient Identification Verified: Yes Signs or symptoms of abuse/neglect since last visito No Secondary Verification Process Completed: Yes Hospitalized since last visit: No Patient Requires Transmission-Based Precautions: No Implantable device outside of the clinic excluding No Patient Has Alerts: No cellular tissue based products placed in the center since last visit: Has Dressing in Place as Prescribed: Yes Has Compression in Place as Prescribed: Yes Pain Present Now: No Electronic Signature(s) Signed: 03/23/2022 5:37:55 PM By: [name], BSN Entered By: Zenaida Deed on 03/23/2022 11:37:50 -------------------------------------------------------------------------------- Compression Therapy Details Patient Name: Date of Service: [name] 03/23/2022 11:15 A M Medical Record Number: [id number] Patient Account Number: [id number] Date of Birth/Sex: Treating RN: 30-Apr-1976 (46 y.o. [name] Primary Care Shanece Cochrane: [name] Other Clinician: Referring Madeline Pho: Treating Stanley Helmuth/Extender: Horton Marshall in Treatment: 87 Compression Therapy Performed for  Wound Assessment: Wound #1 Left Ankle Performed By: Clinician Zenaida Deed, RN Compression Type: Three Layer Post Procedure Diagnosis Same as Pre-procedure Electronic Signature(s) Signed: 03/23/2022 5:37:55 PM By: [name], BSN Entered By: Zenaida Deed on 03/23/2022 11:52:52 -------------------------------------------------------------------------------- Encounter Discharge Information Details Patient Name: Date of Service: [name] 03/23/2022 11:15 A M Medical Record Number: [id number] Patient Account Number: [id number] Date of Birth/Sex: Treating RN: May 23, 1976 (46 y.o. [name] Primary Care Toribio Seiber: [name] Other Clinician: Referring Fardeen Steinberger: Treating Joedy Eickhoff/Extender: Horton Marshall in Treatment: 56 Encounter Discharge Information Items Post Procedure Vitals Discharge Condition: Stable Temperature (F): 97.9 Ambulatory Status: Ambulatory Pulse (bpm): 68 Discharge Destination: Home Respiratory Rate (breaths/min): 18 Transportation: Private Auto Blood Pressure (mmHg): 104/73 Accompanied By: interpreter Schedule Follow-up Appointment: Yes Clinical Summary of Care: Patient Declined Electronic Signature(s) Signed: 03/23/2022 5:37:55 PM By: [name], BSN Entered By: Zenaida Deed on 03/23/2022 12:11:53 -------------------------------------------------------------------------------- Lower Extremity Assessment Details Patient Name: Date of Service: [name] 03/23/2022 11:15 A M Medical Record Number: [id number] Patient Account Number: [id number] Date of Birth/Sex: Treating RN: 1976/02/21 (46 y.o. [name] Primary Care Shalisha Clausing: [name] Other Clinician: Referring Viona Hosking: Treating Terik Haughey/Extender: Horton Marshall in Treatment: 21 Edema Assessment Assessed: [name]: No] [name]: No] Edema: [Left: Ye] [Right: s] Calf Left: Right: Point of  Measurement: 28 cm From Medial Instep 32 cm Ankle Left: Right: Point of Measurement: 8 cm From Medial Instep 22.5 cm Vascular Assessment Pulses: Dorsalis Pedis Palpable: [Left:Yes] Electronic Signature(s) Signed: 03/23/2022 5:37:55 PM By: [name], BSN Entered By: Zenaida Deed on 03/23/2022 11:41:30 -------------------------------------------------------------------------------- Multi Wound Chart Details Patient Name: Date of Service: [name] 03/23/2022 11:15 A M Medical Record Number: [id number] Patient Account Number: [id number] Date of Birth/Sex: Treating RN: July 10, 1976 (46 y.o. [name] Primary Care Jaylea Plourde: [name] Other Clinician: Referring Nashali Ditmer: Treating Charnita Trudel/Extender: Horton Marshall in Treatment: 27 Vital Signs  Height(in): Pulse(bpm): 68 Weight(lbs): 170 Blood Pressure(mmHg): 104/73 Body Mass Index(BMI): Temperature(F): 97.9 Respiratory Rate(breaths/min): 18 Photos: [N/A:N/A] Left Ankle N/A N/A Wound Location: Trauma N/A N/A Wounding Event: Trauma, Other N/A N/A Primary Etiology: 10/14/2020 N/A N/A Date Acquired: 40 N/A N/A Weeks of Treatment: Open N/A N/A Wound Status: No N/A N/A Wound Recurrence: 1.3x2.2x0.2 N/A N/A Measurements L x W x D (cm) 2.246 N/A N/A A (cm) : rea 0.449 N/A N/A Volume (cm) : 96.40% N/A N/A % Reduction in A rea: 98.20% N/A N/A % Reduction in Volume: Full Thickness Without Exposed N/A N/A Classification: Support Structures Medium N/A N/A Exudate A mount: Serosanguineous N/A N/A Exudate Type: red, brown N/A N/A Exudate Color: Flat and Intact N/A N/A Wound Margin: Medium (34-66%) N/A N/A Granulation A mount: Red N/A N/A Granulation Quality: Medium (34-66%) N/A N/A Necrotic A mount: Fat Layer (Subcutaneous Tissue): Yes N/A N/A Exposed Structures: Fascia: No Tendon: No Muscle: No Joint: No Bone: No Small (1-33%) N/A  N/A Epithelialization: Debridement - Selective/Open Wound N/A N/A Debridement: Pre-procedure Verification/Time Out 11:50 N/A N/A Taken: Lidocaine 4% Topical Solution N/A N/A Pain Control: Slough N/A N/A Tissue Debrided: Non-Viable Tissue N/A N/A Level: 2.86 N/A N/A Debridement A (sq cm): rea Curette N/A N/A Instrument: Minimum N/A N/A Bleeding: Pressure N/A N/A Hemostasis A chieved: 0 N/A N/A Procedural Pain: 0 N/A N/A Post Procedural Pain: Procedure was tolerated well N/A N/A Debridement Treatment Response: 1.3x2.2x0.2 N/A N/A Post Debridement Measurements L x W x D (cm) 0.449 N/A N/A Post Debridement Volume: (cm) Compression Therapy N/A N/A Procedures Performed: Debridement Treatment Notes Electronic Signature(s) Signed: 03/23/2022 12:03:53 PM By: [name] FACS Signed: 03/23/2022 5:37:55 PM By: [name], BSN Entered By: Duanne Guess on 03/23/2022 12:03:53 -------------------------------------------------------------------------------- Multi-Disciplinary Care Plan Details Patient Name: Date of Service: [name] 03/23/2022 11:15 A M Medical Record Number: [id number] Patient Account Number: [id number] Date of Birth/Sex: Treating RN: [birth year] (46 y.o. [name] Primary Care Gabryelle Whitmoyer: [name] Other Clinician: Referring Zoiey Christy: Treating Yamilee Harmes/Extender: Horton Marshall in Treatment: 7 Multidisciplinary Care Plan reviewed with physician Active Inactive Venous Leg Ulcer Nursing Diagnoses: Actual venous Insuffiency (use after diagnosis is confirmed) Knowledge deficit related to disease process and management Goals: Patient will maintain optimal edema control Date Initiated: 02/19/2022 Target Resolution Date: 04/23/2022 Goal Status: Active Interventions: Assess peripheral edema status every visit. Compression as ordered Treatment Activities: Therapeutic compression applied :  02/19/2022 Notes: Wound/Skin Impairment Nursing Diagnoses: Knowledge deficit related to ulceration/compromised skin integrity Goals: Patient/caregiver will verbalize understanding of skin care regimen Date Initiated: 02/05/2021 Target Resolution Date: 04/23/2022 Goal Status: Active Interventions: Assess patient/caregiver ability to obtain necessary supplies Assess patient/caregiver ability to perform ulcer/skin care regimen upon admission and as needed Provide education on ulcer and skin care Treatment Activities: Skin care regimen initiated : 02/05/2021 Topical wound management initiated : 02/05/2021 Notes: 03/31/21: Wound care regimen ongoing, target date extended. 04/21/21: Wound care ongoing, through interpreter patient states he is doing fine with his dressing changes. Electronic Signature(s) Signed: 03/23/2022 5:37:55 PM By: [name], BSN Entered By: Zenaida Deed on 03/23/2022 11:50:37 -------------------------------------------------------------------------------- Pain Assessment Details Patient Name: Date of Service: [name] 03/23/2022 11:15 A M Medical Record Number: [id number] Patient Account Number: [id number] Date of Birth/Sex: Treating RN: 10-Nov-1975 (46 y.o. [name] Primary Care Alaja Goldinger: [name] Other Clinician: Referring Rjay Revolorio: Treating Trevar Boehringer/Extender: Horton Marshall in Treatment: 61 Active Problems Location of Pain Severity and  Description of Pain Patient Has Paino No Site Locations Rate the pain. Current Pain Level: 0 Pain Management and Medication Current Pain Management: Electronic Signature(s) Signed: 03/23/2022 5:37:55 PM By: [name], BSN Entered By: Zenaida Deed on 03/23/2022 11:38:20 -------------------------------------------------------------------------------- Patient/Caregiver Education Details Patient Name: Date of Service: [name]  8/22/2023andnbsp11:15 A M Medical Record Number: [id number] Patient Account Number: [id number] Date of Birth/Gender: Treating RN: September 14, 1975 (46 y.o. [name] Primary Care Physician: [name] Other Clinician: Referring Physician: Treating Physician/Extender: Horton Marshall in Treatment: 42 Education Assessment Education Provided To: Patient Education Topics Provided Venous: Methods: Explain/Verbal Responses: Reinforcements needed, State content correctly [employer]) Signed: 03/23/2022 5:37:55 PM By: [name], BSN Entered By: Zenaida Deed on 03/23/2022 11:50:52 -------------------------------------------------------------------------------- Wound Assessment Details Patient Name: Date of Service: [name] 03/23/2022 11:15 A M Medical Record Number: [id number] Patient Account Number: [id number] Date of Birth/Sex: Treating RN: 24-Jan-1976 (46 y.o. [name] Primary Care Evangelene Vora: [name] Other Clinician: Referring Chevella Pearce: Treating Sarahbeth Cashin/Extender: Horton Marshall in Treatment: 5 Wound Status Wound Number: 1 Primary Etiology: Trauma, Other Wound Location: Left Ankle Wound Status: Open Wounding Event: Trauma Date Acquired: 10/14/2020 Weeks Of Treatment: 58 Clustered Wound: No Photos Wound Measurements Length: (cm) 1.3 Width: (cm) 2.2 Depth: (cm) 0.2 Area: (cm) 2.246 Volume: (cm) 0.449 % Reduction in Area: 96.4% % Reduction in Volume: 98.2% Epithelialization: Small (1-33%) Tunneling: No Undermining: No Wound Description Classification: Full Thickness Without Exposed Support Structures Wound Margin: Flat and Intact Exudate Amount: Medium Exudate Type: Serosanguineous Exudate Color: red, brown Foul Odor After Cleansing: No Slough/Fibrino Yes Wound Bed Granulation Amount: Medium (34-66%) Exposed Structure Granulation Quality: Red Fascia Exposed:  No Necrotic Amount: Medium (34-66%) Fat Layer (Subcutaneous Tissue) Exposed: Yes Necrotic Quality: Adherent Slough Tendon Exposed: No Muscle Exposed: No Joint Exposed: No Bone Exposed: No Treatment Notes Wound #1 (Ankle) Wound Laterality: Left Cleanser Soap and Water Discharge Instruction: May shower and wash wound with dial antibacterial soap and water prior to dressing change. Wound Cleanser Discharge Instruction: Cleanse the wound with wound cleanser prior to applying a clean dressing using gauze sponges, not tissue or cotton balls. Peri-Wound Care Zinc Oxide Ointment 30g tube Discharge Instruction: Apply Zinc Oxide to periwound with each dressing change Sween Lotion (Moisturizing lotion) Discharge Instruction: Apply moisturizing lotion as directed Topical Primary Dressing KerraCel Ag Gelling Fiber Dressing, 2x2 in (silver alginate) Discharge Instruction: Apply silver alginate to wound bed as instructed Secondary Dressing Zetuvit Plus 4x8 in Discharge Instruction: Apply over primary dressing as directed. Secured With Compression Wrap ThreePress (3 layer compression wrap) Discharge Instruction: Apply three layer compression as directed. Compression Stockings Add-Ons Electronic Signature(s) Signed: 03/23/2022 5:37:55 PM By: [name], BSN Entered By: Zenaida Deed on 03/23/2022 11:47:43 -------------------------------------------------------------------------------- Vitals Details Patient Name: Date of Service: [name] 03/23/2022 11:15 A M Medical Record Number: [id number] Patient Account Number: [id number] Date of Birth/Sex: Treating RN: 12/27/1975 (46 y.o. [name] Primary Care Verl Kitson: [name] Other Clinician: Referring Jashayla Glatfelter: Treating Ijeoma Loor/Extender: Horton Marshall in Treatment: 57 Vital Signs Time Taken: 11:37 Temperature (F): 97.9 Weight (lbs): 170 Pulse (bpm): 68 Respiratory Rate  (breaths/min): 18 Blood Pressure (mmHg): 104/73 Reference Range: 80 - 120 mg / dl Electronic Signature(s) Signed: 03/23/2022 5:37:55 PM By: [name], BSN Entered By: Zenaida Deed on 03/23/2022 11:38:14

## 2022-03-23 NOTE — Progress Notes (Signed)
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 Signed: 03/23/2022 12:10:13 PM By: [name] Guess MD  FACS Entered By: [name] on 03/23/2022 12:10:13

## 2022-03-29 ENCOUNTER — Encounter (HOSPITAL_BASED_OUTPATIENT_CLINIC_OR_DEPARTMENT_OTHER): Payer: Medicaid Other | Admitting: General Surgery

## 2022-03-29 DIAGNOSIS — L97328 Non-pressure chronic ulcer of left ankle with other specified severity: Secondary | ICD-10-CM | POA: Diagnosis not present

## 2022-03-29 DIAGNOSIS — I87332 Chronic venous hypertension (idiopathic) with ulcer and inflammation of left lower extremity: Secondary | ICD-10-CM | POA: Diagnosis not present

## 2022-03-29 DIAGNOSIS — F17200 Nicotine dependence, unspecified, uncomplicated: Secondary | ICD-10-CM | POA: Diagnosis not present

## 2022-03-30 NOTE — Progress Notes (Signed)
[name], [name] ([id number]) Visit Report for 03/29/2022 Arrival Information Details Patient Name: Date of Service: [name] [place] NIE 03/29/2022 11:15 A M Medical Record Number: [id number] Patient Account Number: [id number] Date of Birth/Sex: Treating RN: [birth year] (46 y.o. [name], [name] Primary Care Karolee Meloni: [name] Other Clinician: Referring Zauria Dombek: Treating Fanny Agan/Extender: Horton Marshall in Treatment: 52 Visit Information History Since Last Visit Added or deleted any medications: No Patient Arrived: Ambulatory Any new allergies or adverse reactions: No Arrival Time: 11:14 Had a fall or experienced change in No Accompanied By: interpreter activities of daily living that may affect Transfer Assistance: None risk of falls: Patient Identification Verified: Yes Signs or symptoms of abuse/neglect since last visito No Secondary Verification Process Completed: Yes Hospitalized since last visit: No Patient Requires Transmission-Based Precautions: No Implantable device outside of the clinic excluding No Patient Has Alerts: No cellular tissue based products placed in the center since last visit: Has Dressing in Place as Prescribed: Yes Has Compression in Place as Prescribed: Yes Pain Present Now: No Electronic Signature(s) Signed: 03/30/2022 6:08:33 PM By: [name], BSN Entered By: Zenaida Deed on 03/29/2022 11:21:08 -------------------------------------------------------------------------------- Compression Therapy Details Patient Name: Date of Service: [name] 03/29/2022 11:15 A M Medical Record Number: [id number] Patient Account Number: [id number] Date of Birth/Sex: Treating RN: 09-10-1975 (46 y.o. [name] Primary Care Masaki Rothbauer: [name] Other Clinician: Referring Marajade Lei: Treating Weyman Bogdon/Extender: Horton Marshall in Treatment: 46 Compression Therapy Performed for  Wound Assessment: Wound #1 Left Ankle Performed By: Clinician Zenaida Deed, RN Compression Type: Three Layer Post Procedure Diagnosis Same as Pre-procedure Electronic Signature(s) Signed: 03/30/2022 6:08:33 PM By: [name], BSN Entered By: Zenaida Deed on 03/29/2022 11:37:35 -------------------------------------------------------------------------------- Encounter Discharge Information Details Patient Name: Date of Service: [name] 03/29/2022 11:15 A M Medical Record Number: [id number] Patient Account Number: [id number] Date of Birth/Sex: Treating RN: [birth year] (46 y.o. [name] Primary Care Karuna Balducci: [name] Other Clinician: Referring Morrigan Wickens: Treating Guadalupe Nickless/Extender: Horton Marshall in Treatment: 39 Encounter Discharge Information Items Post Procedure Vitals Discharge Condition: Stable Temperature (F): 97.4 Ambulatory Status: Ambulatory Pulse (bpm): 64 Discharge Destination: Home Respiratory Rate (breaths/min): 18 Transportation: Private Auto Blood Pressure (mmHg): 101/67 Accompanied By: interpreter Schedule Follow-up Appointment: Yes Clinical Summary of Care: Patient Declined Electronic Signature(s) Signed: 03/30/2022 6:08:33 PM By: [name], BSN Entered By: Zenaida Deed on 03/29/2022 11:57:37 -------------------------------------------------------------------------------- Lower Extremity Assessment Details Patient Name: Date of Service: [name] 03/29/2022 11:15 A M Medical Record Number: [id number] Patient Account Number: [id number] Date of Birth/Sex: Treating RN: 02-10-1976 (46 y.o. [name] Primary Care Willies Laviolette: [name] Other Clinician: Referring Mckenzi Buonomo: Treating Donelda Mailhot/Extender: Horton Marshall in Treatment: 59 Edema Assessment Assessed: [name]: No] [name]: No] Edema: [Left: Ye] [Right: s] Calf Left: Right: Point of  Measurement: 28 cm From Medial Instep 30.3 cm Ankle Left: Right: Point of Measurement: 8 cm From Medial Instep 22.5 cm Vascular Assessment Pulses: Dorsalis Pedis Palpable: [Left:No] Electronic Signature(s) Signed: 03/30/2022 6:08:33 PM By: [name], BSN Entered By: Zenaida Deed on 03/29/2022 11:24:48 -------------------------------------------------------------------------------- Multi Wound Chart Details Patient Name: Date of Service: [name] 03/29/2022 11:15 A M Medical Record Number: [id number] Patient Account Number: [id number] Date of Birth/Sex: Treating RN: September 09, 1975 (46 y.o. [name] Primary Care Kol Consuegra: [name] Other Clinician: Referring Brycelyn Gambino: Treating Kolbi Altadonna/Extender: Horton Marshall in Treatment: 70 Vital Signs  Height(in): Pulse(bpm): 64 Weight(lbs): 170 Blood Pressure(mmHg): 101/67 Body Mass Index(BMI): Temperature(F): 97.4 Respiratory Rate(breaths/min): 18 Photos: [N/A:N/A] Left Ankle N/A N/A Wound Location: Trauma N/A N/A Wounding Event: Trauma, Other N/A N/A Primary Etiology: 10/14/2020 N/A N/A Date Acquired: 36 N/A N/A Weeks of Treatment: Open N/A N/A Wound Status: No N/A N/A Wound Recurrence: 1.3x3.1x0.2 N/A N/A Measurements L x W x D (cm) 3.165 N/A N/A A (cm) : rea 0.633 N/A N/A Volume (cm) : 94.90% N/A N/A % Reduction in A rea: 97.40% N/A N/A % Reduction in Volume: Full Thickness Without Exposed N/A N/A Classification: Support Structures Medium N/A N/A Exudate A mount: Serosanguineous N/A N/A Exudate Type: red, brown N/A N/A Exudate Color: Flat and Intact N/A N/A Wound Margin: Medium (34-66%) N/A N/A Granulation A mount: Red N/A N/A Granulation Quality: Medium (34-66%) N/A N/A Necrotic A mount: Fat Layer (Subcutaneous Tissue): Yes N/A N/A Exposed Structures: Fascia: No Tendon: No Muscle: No Joint: No Bone: No Small (1-33%) N/A  N/A Epithelialization: Debridement - Excisional N/A N/A Debridement: Pre-procedure Verification/Time Out 11:35 N/A N/A Taken: Lidocaine 4% Topical Solution N/A N/A Pain Control: Subcutaneous, Slough N/A N/A Tissue Debrided: Skin/Subcutaneous Tissue N/A N/A Level: 4.03 N/A N/A Debridement A (sq cm): rea Curette N/A N/A Instrument: Minimum N/A N/A Bleeding: Pressure N/A N/A Hemostasis A chieved: 2 N/A N/A Procedural Pain: 1 N/A N/A Post Procedural Pain: Procedure was tolerated well N/A N/A Debridement Treatment Response: 1.3x3.1x0.2 N/A N/A Post Debridement Measurements L x W x D (cm) 0.633 N/A N/A Post Debridement Volume: (cm) Compression Therapy N/A N/A Procedures Performed: Debridement Treatment Notes Electronic Signature(s) Signed: 03/29/2022 11:49:29 AM By: [name] FACS Signed: 03/30/2022 6:08:33 PM By: [name], BSN Entered By: Duanne Guess on 03/29/2022 11:49:29 -------------------------------------------------------------------------------- Multi-Disciplinary Care Plan Details Patient Name: Date of Service: [name] 03/29/2022 11:15 A M Medical Record Number: [id number] Patient Account Number: [id number] Date of Birth/Sex: Treating RN: 21-Nov-1975 (46 y.o. 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 Compression as ordered Treatment Activities: Therapeutic compression  applied : 02/19/2022 Notes: Wound/Skin Impairment Nursing Diagnoses: Knowledge deficit related to ulceration/compromised skin integrity Goals: Patient/caregiver will verbalize understanding of skin care regimen Date Initiated: 02/05/2021 Target Resolution Date: 04/23/2022 Goal Status: Active Interventions: Assess patient/caregiver ability to obtain necessary supplies Assess patient/caregiver ability to perform ulcer/skin care regimen upon admission and as needed Provide education on ulcer and skin care Treatment Activities: Skin care regimen initiated : 02/05/2021 Topical wound management initiated : 02/05/2021 Notes: 03/31/21: Wound care regimen ongoing, target date extended. 04/21/21: Wound care ongoing, through interpreter patient states he is doing fine with his dressing changes. Electronic Signature(s) Signed: 03/30/2022 6:08:33 PM By: [name], BSN Entered By: Zenaida Deed on 03/29/2022 11:32:14 -------------------------------------------------------------------------------- Pain Assessment Details Patient Name: Date of Service: [name] 03/29/2022 11:15 A M Medical Record Number: [id number] Patient Account Number: [id number] Date of Birth/Sex: Treating RN: [birth year] (46 y.o. [name] Primary Care Charly Holcomb: [name] Other Clinician: Referring Romayne Ticas: Treating Breda Bond/Extender: Horton Marshall in Treatment: 91 Active Problems Location of Pain Severity and  Description of Pain Patient Has Paino No Site Locations Rate the pain. Current Pain Level: 0 Pain Management and Medication Current Pain Management: Electronic Signature(s) Signed: 03/30/2022 6:08:33 PM By: [name], BSN Entered By: Zenaida Deed on 03/29/2022 11:21:50 -------------------------------------------------------------------------------- Patient/Caregiver Education Details Patient Name: Date of Service: [name]  8/28/2023andnbsp11:15 A M Medical Record Number: [id number] Patient Account Number: [id number] Date of Birth/Gender: Treating RN: Dec 20, 1975 (46 y.o. [name] Primary Care Physician: [name] Other Clinician: Referring Physician: Treating Physician/Extender: Horton Marshall in Treatment: 72 Education Assessment Education Provided To: Patient Education Topics Provided Venous: Methods: Explain/Verbal Responses: Reinforcements needed, State content correctly Wound/Skin Impairment: Methods: Explain/Verbal Responses: Reinforcements needed, State content correctly Electronic Signature(s) Signed: 03/30/2022 6:08:33 PM By: [name], BSN Entered By: Zenaida Deed on 03/29/2022 11:33:37 -------------------------------------------------------------------------------- Wound Assessment Details Patient Name: Date of Service: [name] 03/29/2022 11:15 A M Medical Record Number: [id number] Patient Account Number: [id number] Date of Birth/Sex: Treating RN: [birth year] (46 y.o. [name] Primary Care Akasha Melena: [name] Other Clinician: Referring Shirlean Berman: Treating Tynika Luddy/Extender: Horton Marshall in Treatment: 106 Wound Status Wound Number: 1 Primary Etiology: Trauma, Other Wound Location: Left Ankle Wound Status: Open Wounding Event: Trauma Date Acquired: 10/14/2020 Weeks Of Treatment: 59 Clustered Wound: No Photos Wound Measurements Length: (cm) 1.3 Width: (cm) 3.1 Depth: (cm) 0.2 Area: (cm) 3.165 Volume: (cm) 0.633 % Reduction in Area: 94.9% % Reduction in Volume: 97.4% Epithelialization: Small (1-33%) Tunneling: No Undermining: No Wound Description Classification: Full Thickness Without Exposed Support Structures Wound Margin: Flat and Intact Exudate Amount: Medium Exudate Type: Serosanguineous Exudate Color: red, brown Foul Odor After Cleansing: No Slough/Fibrino  Yes Wound Bed Granulation Amount: Medium (34-66%) Exposed Structure Granulation Quality: Red Fascia Exposed: No Necrotic Amount: Medium (34-66%) Fat Layer (Subcutaneous Tissue) Exposed: Yes Necrotic Quality: Adherent Slough Tendon Exposed: No Muscle Exposed: No Joint Exposed: No Bone Exposed: No Treatment Notes Wound #1 (Ankle) Wound Laterality: Left Cleanser Soap and Water Discharge Instruction: May shower and wash wound with dial antibacterial soap and water prior to dressing change. Wound Cleanser Discharge Instruction: Cleanse the wound with wound cleanser prior to applying a clean dressing using gauze sponges, not tissue or cotton balls. Peri-Wound Care Zinc Oxide Ointment 30g tube Discharge Instruction: Apply Zinc Oxide to periwound with each dressing change Sween Lotion (Moisturizing lotion) Discharge Instruction: Apply moisturizing lotion as directed Topical Primary Dressing Promogran Prisma Matrix, 4.34 (sq in) (silver collagen) Discharge Instruction: Moisten collagen with saline or hydrogel Secondary Dressing Drawtex 4x4 in Discharge Instruction: Apply over primary dressing as directed. Zetuvit Plus 4x8 in Discharge Instruction: Apply over primary dressing as directed. Secured With Compression Wrap ThreePress (3 layer compression wrap) Discharge Instruction: Apply three layer compression as directed. Compression Stockings Add-Ons Electronic Signature(s) Signed: 03/30/2022 6:08:33 PM By: [name], BSN Entered By: Zenaida Deed on 03/29/2022 11:30:43 -------------------------------------------------------------------------------- Vitals Details Patient Name: Date of Service: [name] 03/29/2022 11:15 A M Medical Record Number: [id number] Patient Account Number: [id number] Date of Birth/Sex: Treating RN: 1975-10-14 (46 y.o. [name] Primary Care Inell Mimbs: [name] Other Clinician: Referring Gyasi Hazzard: Treating  Jourdain Guay/Extender: Horton Marshall in Treatment: 38 Vital Signs Time Taken: 11:21 Temperature (F): 97.4 Weight (lbs): 170 Pulse (bpm): 64 Respiratory Rate (breaths/min): 18 Blood Pressure (mmHg): 101/67 Reference Range: 80 - 120 mg / dl Electronic Signature(s) Signed: 03/30/2022 6:08:33 PM By: [name], BSN Entered By: Zenaida Deed  on 03/29/2022 11:21:32

## 2022-03-30 NOTE — Progress Notes (Signed)
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 Signed: 03/30/2022 6:08:33 PM By: [name], BSN Entered By: Duanne Guess on  03/29/2022 11:50:27 -------------------------------------------------------------------------------- SuperBill Details Patient Name: Date of Service: [name] 03/29/2022 Medical Record Number: [id number] Patient  Account Number: [id number] Date of Birth/Sex: Treating RN: August 29, 1975 (46 y.o. 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 Chronic venous hypertension (idiopathic) with ulcer and inflammation of left lower extremity Facility Procedures CPT4 Code: [id number] Description: [id number] - DEB SUBQ TISSUE 20 SQ CM/< ICD-10 Diagnosis Description L97.328 Non-pressure chronic ulcer of left ankle with other specified severity Modifier: Quantity: 1 Physician Procedures : CPT4 Code Description Modifier [id number] - WC PHYS LEVEL 3 - EST PT 25 ICD-10 Diagnosis Description L97.328 Non-pressure chronic ulcer of left ankle with other specified severity I87.332 Chronic venous hypertension (idiopathic) with ulcer and  inflammation of left lower extremity Quantity: 1 : [id number] - WC PHYS SUBQ TISS 20 SQ CM ICD-10 Diagnosis Description L97.328 Non-pressure chronic ulcer of left ankle with other specified severity Quantity: 1 Electronic Signature(s) Signed: 03/29/2022 11:51:50 AM By: [name] MD FACS Entered By: [name] on 03/29/2022 11:51:50

## 2022-04-02 DIAGNOSIS — Z419 Encounter for procedure for purposes other than remedying health state, unspecified: Secondary | ICD-10-CM | POA: Diagnosis not present

## 2022-04-07 ENCOUNTER — Encounter (HOSPITAL_BASED_OUTPATIENT_CLINIC_OR_DEPARTMENT_OTHER): Payer: Medicaid Other | Attending: General Surgery | Admitting: General Surgery

## 2022-04-07 DIAGNOSIS — L97322 Non-pressure chronic ulcer of left ankle with fat layer exposed: Secondary | ICD-10-CM | POA: Diagnosis not present

## 2022-04-07 DIAGNOSIS — I87332 Chronic venous hypertension (idiopathic) with ulcer and inflammation of left lower extremity: Secondary | ICD-10-CM | POA: Diagnosis not present

## 2022-04-07 DIAGNOSIS — L97328 Non-pressure chronic ulcer of left ankle with other specified severity: Secondary | ICD-10-CM | POA: Insufficient documentation

## 2022-04-07 NOTE — Progress Notes (Signed)
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 Signature(s) Signed: 04/07/2022 11:31:48 AM By: [name] FACS Entered By: Duanne Guess on 04/07/2022  11:19:29 -------------------------------------------------------------------------------- SuperBill Details Patient Name: Date of Service: [name], [name] 04/07/2022 Medical  Record Number: [id number] Patient Account Number: [id number] Date of Birth/Sex: Treating RN: [birth year] (46 y.o. M) Primary Care Provider: [name] Other Clinician: Referring Provider: Treating Provider/Extender: Horton Marshall in Treatment: 60 Diagnosis Coding ICD-10 Codes Code Description [phone] Non-pressure chronic ulcer of left ankle with other specified severity I87.332 Chronic venous hypertension (idiopathic) with ulcer and inflammation of left lower extremity Facility Procedures CPT4 Code: [id number] Description: 11042 - DEB SUBQ TISSUE 20 SQ CM/< ICD-10 Diagnosis Description L97.328 Non-pressure chronic ulcer of left ankle with other specified severity Modifier: Quantity: 1 Physician Procedures : CPT4 Code Description Modifier [id number] - WC PHYS LEVEL 3 - EST PT 25 ICD-10 Diagnosis Description L97.328 Non-pressure chronic ulcer of left ankle with other specified severity I87.332 Chronic venous hypertension (idiopathic) with ulcer and  inflammation of left lower extremity Quantity: 1 : [id number] 11042 - WC PHYS SUBQ TISS 20 SQ CM ICD-10 Diagnosis Description L97.328 Non-pressure chronic ulcer of left ankle with other specified severity Quantity: 1 Electronic Signature(s) Signed: 04/07/2022 11:20:35 AM By: [name] FACS Entered By: Duanne Guess on 04/07/2022 11:20:34

## 2022-04-07 NOTE — Progress Notes (Signed)
[name], [name] ([id number]) Visit Report for 04/07/2022 Arrival Information Details Patient Name: Date of Service: [name] [place] NIE 04/07/2022 10:30 [name] M Medical Record Number: [id number] Patient Account Number: [id number] Date of Birth/Sex: Treating RN: 08/09/1975 (46 y.o. [name], [name] Primary Care [name]: [name] Other Clinician: Referring [name]: Treating [name]/Extender: [name] in Treatment: 60 Visit Information History Since Last Visit Added or deleted any medications: No Patient Arrived: Ambulatory Any new allergies or adverse reactions: No Arrival Time: 10:21 Had [name] fall or experienced change in No Accompanied By: interpreter activities of daily living that may affect Transfer Assistance: None risk of falls: Patient Identification Verified: Yes Signs or symptoms of abuse/neglect since last visito No Secondary Verification Process Completed: Yes Hospitalized since last visit: No Patient Requires Transmission-Based Precautions: No Implantable device outside of the clinic excluding No Patient Has Alerts: No cellular tissue based products placed in the center since last visit: Has Dressing in Place as Prescribed: Yes Has Compression in Place as Prescribed: Yes Pain Present Now: No Electronic Signature(s) Signed: 04/07/2022 5:54:13 PM By: [name] Deed RN, BSN Entered By: [name] on 04/07/2022 10:22:16 -------------------------------------------------------------------------------- Compression Therapy Details Patient Name: Date of Service: [name] 04/07/2022 10:30 [name] M Medical Record Number: [id number] Patient Account Number: [id number] Date of Birth/Sex: Treating RN: 03-08-1976 (46 y.o. [name] Primary Care [name]: [name] Other Clinician: Referring [name]: Treating [name]/Extender: [name] in Treatment: 60 Compression Therapy Performed for  Wound Assessment: Wound #1 Left Ankle Performed By: Clinician [name] Deed, RN Compression Type: Three Layer Post Procedure Diagnosis Same as Pre-procedure Electronic Signature(s) Signed: 04/07/2022 5:54:13 PM By: [name] Deed RN, BSN Entered By: [name] on 04/07/2022 11:02:28 -------------------------------------------------------------------------------- Encounter Discharge Information Details Patient Name: Date of Service: [name] 04/07/2022 10:30 [name] M Medical Record Number: [id number] Patient Account Number: [id number] Date of Birth/Sex: Treating RN: 1976-08-01 (46 y.o. [name] Primary Care [name]: [name] Other Clinician: Referring [name]: Treating [name]/Extender: [name] in Treatment: 67 Encounter Discharge Information Items Post Procedure Vitals Discharge Condition: Stable Temperature (F): 98.3 Ambulatory Status: Ambulatory Pulse (bpm): 66 Discharge Destination: Home Respiratory Rate (breaths/min): 18 Transportation: Private Auto Blood Pressure (mmHg): 108/68 Accompanied By: self Schedule Follow-up Appointment: Yes Clinical Summary of Care: Patient Declined Electronic Signature(s) Signed: 04/07/2022 5:54:13 PM By: [name] Deed RN, BSN Entered By: [name] on 04/07/2022 11:22:27 -------------------------------------------------------------------------------- Lower Extremity Assessment Details Patient Name: Date of Service: [name] 04/07/2022 10:30 [name] M Medical Record Number: [id number] Patient Account Number: [id number] Date of Birth/Sex: Treating RN: 13-Dec-1975 (46 y.o. [name] Primary Care [name]: [name] Other Clinician: Referring Venida Tsukamoto: Treating [name]/Extender: [name] in Treatment: 60 Edema Assessment Assessed: [name]: No] [name]: No] Edema: [Left: Ye] [Right: s] Calf Left: Right: Point of Measurement:  28 cm From Medial Instep 30.5 cm Ankle Left: Right: Point of Measurement: 8 cm From Medial Instep 22.5 cm Vascular Assessment Pulses: Dorsalis Pedis Palpable: [Left:Yes] Electronic Signature(s) Signed: 04/07/2022 5:54:13 PM By: [name] Deed RN, BSN Entered By: [name] on 04/07/2022 10:25:53 -------------------------------------------------------------------------------- Multi Wound Chart Details Patient Name: Date of Service: [name] 04/07/2022 10:30 [name] M Medical Record Number: [id number] Patient Account Number: [id number] Date of Birth/Sex: Treating RN: 1976-03-04 (46 y.o. M) Primary Care [name]: [name] Other Clinician: Referring [name]: Treating [name]/Extender: [name] in Treatment: 60 Vital Signs Height(in): Pulse(bpm):  66 Weight(lbs): 170 Blood Pressure(mmHg): 108/68 Body Mass Index(BMI): Temperature(F): 98.3 Respiratory Rate(breaths/min): 18 Photos: [[name]:[name]] Left Ankle [name] [name] Wound Location: Trauma [name] [name] Wounding Event: Trauma, Other [name] [name] Primary Etiology: 10/14/2020 [name] [name] Date Acquired: 99 [name] [name] Weeks of Treatment: Open [name] [name] Wound Status: No [name] [name] Wound Recurrence: 1.4x2.8x0.1 [name] [name] Measurements L x W x D (cm) 3.079 [name] [name] [name] (cm) : rea 0.308 [name] [name] Volume (cm) : 95.00% [name] [name] % Reduction in [name] rea: 98.70% [name] [name] % Reduction in Volume: Full Thickness Without Exposed [name] [name] Classification: Support Structures Medium [name] [name] Exudate [name] mount: Serosanguineous [name] [name] Exudate Type: red, brown [name] [name] Exudate Color: Flat and Intact [name] [name] Wound Margin: Large (67-100%) [name] [name] Granulation [name] mount: Red [name] [name] Granulation Quality: Small (1-33%) [name] [name] Necrotic [name] mount: Fat Layer (Subcutaneous Tissue): Yes [name] [name] Exposed Structures: Fascia: No Tendon: No Muscle: No Joint: No Bone: No Small (1-33%) [name] [name] Epithelialization: Debridement -  Excisional [name] [name] Debridement: Pre-procedure Verification/Time Out 11:00 [name] [name] Taken: Lidocaine 4% Topical Solution [name] [name] Pain Control: Necrotic/Eschar, Subcutaneous, [name] [name] Tissue Debrided: Slough Skin/Subcutaneous Tissue [name] [name] Level: 3.92 [name] [name] Debridement [name] (sq cm): rea Curette [name] [name] Instrument: Minimum [name] [name] Bleeding: Pressure [name] [name] Hemostasis Achieved: 3 [name] [name] Procedural Pain: 1 [name] [name] Post Procedural Pain: Debridement Treatment Response: Procedure was tolerated well [name] [name] Post Debridement Measurements L x 1.4x2.8x0.2 [name] [name] W x D (cm) 0.616 [name] [name] Post Debridement Volume: (cm) Compression Therapy [name] [name] Procedures Performed: Debridement Treatment Notes Electronic Signature(s) Signed: 04/07/2022 11:18:29 AM By: [name] FACS Entered By: Duanne Guess on 04/07/2022 11:18:28 -------------------------------------------------------------------------------- Multi-Disciplinary Care Plan Details Patient Name: Date of Service: [name] 04/07/2022 10:30 [name] M Medical Record Number: [id number] Patient Account Number: [id number] Date of Birth/Sex: Treating RN: 09-17-1975 (46 y.o. [name] Primary Care Yuki Brunsman: [name] Other Clinician: Referring Rubina Basinski: Treating Vic Esco/Extender: [name] in Treatment: 60 Multidisciplinary Care Plan reviewed with physician Active Inactive Venous Leg Ulcer Nursing Diagnoses: Actual venous Insuffiency (use after diagnosis is confirmed) Knowledge deficit related to disease process and management Goals: Patient will maintain optimal edema control Date Initiated: 02/19/2022 Target Resolution Date: 04/23/2022 Goal Status: Active Interventions: Assess peripheral edema status every visit. Compression as ordered Treatment Activities: Therapeutic compression applied : 02/19/2022 Notes: Wound/Skin Impairment Nursing Diagnoses: Knowledge  deficit related to ulceration/compromised skin integrity Goals: Patient/caregiver will verbalize understanding of skin care regimen Date Initiated: 02/05/2021 Target Resolution Date: 04/23/2022 Goal Status: Active Interventions: Assess patient/caregiver ability to obtain necessary supplies Assess patient/caregiver ability to perform ulcer/skin care regimen upon admission and as needed Provide education on ulcer and skin care Treatment Activities: Skin care regimen initiated : 02/05/2021 Topical wound management initiated : 02/05/2021 Notes: 03/31/21: Wound care regimen ongoing, target date extended. 04/21/21: Wound care ongoing, through interpreter patient states he is doing fine with his dressing changes. Electronic Signature(s) Signed: 04/07/2022 5:54:13 PM By: [name] Deed RN, BSN Entered By: [name] on 04/07/2022 10:36:37 -------------------------------------------------------------------------------- Pain Assessment Details Patient Name: Date of Service: [name] 04/07/2022 10:30 [name] M Medical Record Number: [id number] Patient Account Number: [id number] Date of Birth/Sex: Treating RN: 19-Oct-1975 (46 y.o. [name] Primary Care Zadrian Mccauley: [name] Other Clinician: Referring Jhon Mallozzi: Treating Irfan Veal/Extender: [name] in Treatment: 71 Active Problems Location of Pain Severity and Description of Pain Patient Has Paino No Site Locations Rate the  pain. Current Pain Level: 0 Pain Management and Medication Current Pain Management: Electronic Signature(s) Signed: 04/07/2022 5:54:13 PM By: [name] Deed RN, BSN Entered By: [name] on 04/07/2022 10:22:44 -------------------------------------------------------------------------------- Patient/Caregiver Education Details Patient Name: Date of Service: [name] 9/6/2023andnbsp10:30 [name] M Medical Record Number: [id number] Patient Account Number:  [id number] Date of Birth/Gender: Treating RN: 14-May-1976 (46 y.o. [name] Primary Care Physician: [name] Other Clinician: Referring Physician: Treating Physician/Extender: [name] in Treatment: 64 Education Assessment Education Provided To: Patient Education Topics Provided Venous: Methods: Explain/Verbal Responses: Reinforcements needed, State content correctly Wound/Skin Impairment: Methods: Explain/Verbal Responses: Reinforcements needed, State content correctly Electronic Signature(s) Signed: 04/07/2022 5:54:13 PM By: [name] Deed RN, BSN Entered By: [name] on 04/07/2022 10:37:01 -------------------------------------------------------------------------------- Wound Assessment Details Patient Name: Date of Service: [name] 04/07/2022 10:30 [name] M Medical Record Number: [id number] Patient Account Number: [id number] Date of Birth/Sex: Treating RN: 1976-03-19 (46 y.o. [name] Primary Care Kizzy Olafson: [name] Other Clinician: Referring Stefannie Defeo: Treating Jani Moronta/Extender: [name] in Treatment: 60 Wound Status Wound Number: 1 Primary Etiology: Trauma, Other Wound Location: Left Ankle Wound Status: Open Wounding Event: Trauma Date Acquired: 10/14/2020 Weeks Of Treatment: 60 Clustered Wound: No Photos Wound Measurements Length: (cm) 1.4 Width: (cm) 2.8 Depth: (cm) 0.1 Area: (cm) 3.079 Volume: (cm) 0.308 % Reduction in Area: 95% % Reduction in Volume: 98.7% Epithelialization: Small (1-33%) Tunneling: No Undermining: No Wound Description Classification: Full Thickness Without Exposed Support Structures Wound Margin: Flat and Intact Exudate Amount: Medium Exudate Type: Serosanguineous Exudate Color: red, brown Foul Odor After Cleansing: No Slough/Fibrino Yes Wound Bed Granulation Amount: Large (67-100%) Exposed Structure Granulation Quality:  Red Fascia Exposed: No Necrotic Amount: Small (1-33%) Fat Layer (Subcutaneous Tissue) Exposed: Yes Necrotic Quality: Adherent Slough Tendon Exposed: No Muscle Exposed: No Joint Exposed: No Bone Exposed: No Treatment Notes Wound #1 (Ankle) Wound Laterality: Left Cleanser Soap and Water Discharge Instruction: May shower and wash wound with dial antibacterial soap and water prior to dressing change. Wound Cleanser Discharge Instruction: Cleanse the wound with wound cleanser prior to applying [name] clean dressing using gauze sponges, not tissue or cotton balls. Peri-Wound Care Zinc Oxide Ointment 30g tube Discharge Instruction: Apply Zinc Oxide to periwound with each dressing change Sween Lotion (Moisturizing lotion) Discharge Instruction: Apply moisturizing lotion as directed Topical Primary Dressing Promogran Prisma Matrix, 4.34 (sq in) (silver collagen) Discharge Instruction: Moisten collagen with saline or hydrogel Secondary Dressing Drawtex 4x4 in Discharge Instruction: Apply over primary dressing as directed. Zetuvit Plus 4x8 in Discharge Instruction: Apply over primary dressing as directed. Secured With Compression Wrap ThreePress (3 layer compression wrap) Discharge Instruction: Apply three layer compression as directed. Compression Stockings Add-Ons Electronic Signature(s) Signed: 04/07/2022 5:54:13 PM By: [name] Deed RN, BSN Entered By: [name] on 04/07/2022 10:35:05 -------------------------------------------------------------------------------- Vitals Details Patient Name: Date of Service: [name], [name] [name] 04/07/2022 10:30 [name] M Medical Record Number: [id number] Patient Account Number: [id number] Date of Birth/Sex: Treating RN: 1976/03/07 (46 y.o. [name] Primary Care Matheo Rathbone: [name] Other Clinician: Referring Arelie Kuzel: Treating Ivis Nicolson/Extender: [name] in Treatment: 60 Vital Signs Time Taken:  10:22 Temperature (F): 98.3 Weight (lbs): 170 Pulse (bpm): 66 Respiratory Rate (breaths/min): 18 Blood Pressure (mmHg): 108/68 Reference Range: 80 - 120 mg / dl Electronic Signature(s) Signed: 04/07/2022 5:54:13 PM By: [name] Deed RN, BSN Entered By: [name] on 04/07/2022 10:22:37

## 2022-04-15 ENCOUNTER — Encounter (HOSPITAL_BASED_OUTPATIENT_CLINIC_OR_DEPARTMENT_OTHER): Payer: Medicaid Other | Admitting: General Surgery

## 2022-04-15 DIAGNOSIS — L97328 Non-pressure chronic ulcer of left ankle with other specified severity: Secondary | ICD-10-CM | POA: Diagnosis not present

## 2022-04-15 DIAGNOSIS — I87332 Chronic venous hypertension (idiopathic) with ulcer and inflammation of left lower extremity: Secondary | ICD-10-CM | POA: Diagnosis not present

## 2022-04-15 DIAGNOSIS — S91002A Unspecified open wound, left ankle, initial encounter: Secondary | ICD-10-CM | POA: Diagnosis not present

## 2022-04-15 NOTE — Progress Notes (Signed)
TODDY, WAUGH ([id number]) Visit Report for 04/15/2022 Arrival Information Details Patient Name: Date of Service: [name] [place] 04/15/2022 11:15 A M Medical Record Number: [id number] Patient Account Number: [id number] Date of Birth/Sex: Treating RN: 04-Apr-1976 (46 y.o. [name] Primary Care Ceil Roderick: Wells Guiles Other Clinician: Referring Jameire Kouba: Treating Ciaira Natividad/Extender: [name] in Treatment: 30 Visit Information History Since Last Visit Added or deleted any medications: No Patient Arrived: Ambulatory Any new allergies or adverse reactions: No Arrival Time: 11:38 Had a fall or experienced change in No Transfer Assistance: None activities of daily living that may affect Patient Identification Verified: Yes risk of falls: Secondary Verification Process Completed: Yes Signs or symptoms of abuse/neglect since last visito No Patient Requires Transmission-Based Precautions: No Hospitalized since last visit: No Patient Has Alerts: No Implantable device outside of the clinic excluding No cellular tissue based products placed in the center since last visit: Has Compression in Place as Prescribed: Yes Pain Present Now: No Electronic Signature(s) Signed: 04/15/2022 4:42:06 PM By: Erenest Blank Entered By: Erenest Blank on 04/15/2022 11:38:54 -------------------------------------------------------------------------------- Compression Therapy Details Patient Name: Date of Service: [name] NA [place] 04/15/2022 11:15 A M Medical Record Number: [id number] Patient Account Number: [id number] Date of Birth/Sex: Treating RN: [birth year] (46 y.o. [name] Primary Care Lillianne Eick: Wells Guiles Other Clinician: Referring Lema Heinkel: Treating Yoshiko Keleher/Extender: [name] in Treatment: 31 Compression Therapy Performed for Wound Assessment: Wound #1 Left Ankle Performed By: Clinician Baruch Gouty,  RN Compression Type: Three Layer Post Procedure Diagnosis Same as Pre-procedure Electronic Signature(s) Signed: 04/15/2022 5:22:18 PM By: Baruch Gouty RN, BSN Entered By: Baruch Gouty on 04/15/2022 12:06:50 -------------------------------------------------------------------------------- Encounter Discharge Information Details Patient Name: Date of Service: [name] NA [place] 04/15/2022 11:15 A M Medical Record Number: [id number] Patient Account Number: [id number] Date of Birth/Sex: Treating RN: Jan 05, 1976 (46 y.o. [name] Primary Care Leeanne Butters: Wells Guiles Other Clinician: Referring Prinston Kynard: Treating Zyriah Mask/Extender: [name] in Treatment: 62 Encounter Discharge Information Items Post Procedure Vitals Discharge Condition: Stable Temperature (F): 97.4 Ambulatory Status: Ambulatory Pulse (bpm): 69 Discharge Destination: Home Respiratory Rate (breaths/min): 18 Transportation: Private Auto Blood Pressure (mmHg): 109/60 Accompanied By: interpreter Schedule Follow-up Appointment: Yes Clinical Summary of Care: Patient Declined Electronic Signature(s) Signed: 04/15/2022 5:22:18 PM By: Baruch Gouty RN, BSN Entered By: Baruch Gouty on 04/15/2022 16:30:53 -------------------------------------------------------------------------------- Lower Extremity Assessment Details Patient Name: Date of Service: [name] NA [place] 04/15/2022 11:15 A M Medical Record Number: [id number] Patient Account Number: [id number] Date of Birth/Sex: Treating RN: 11-25-1975 (46 y.o. [name] Primary Care Florine Sprenkle: Wells Guiles Other Clinician: Referring Delainie Chavana: Treating Sueann Brownley/Extender: [name] in Treatment: 106 Edema Assessment Assessed: Shirlyn Goltz: No] [name]: Yes] Edema: [Left: Ye] [Right: s] Calf Left: Right: Point of Measurement: 28 cm From Medial Instep 30.5 cm Ankle Left: Right: Point of  Measurement: 8 cm From Medial Instep 22.5 cm Vascular Assessment Pulses: Dorsalis Pedis Palpable: [Left:Yes] Electronic Signature(s) Signed: 04/15/2022 4:42:06 PM By: Erenest Blank Signed: 04/15/2022 5:22:18 PM By: Baruch Gouty RN, BSN Entered By: Erenest Blank on 04/15/2022 11:50:37 -------------------------------------------------------------------------------- Multi Wound Chart Details Patient Name: Date of Service: [name] NA [place] 04/15/2022 11:15 A M Medical Record Number: [id number] Patient Account Number: [id number] Date of Birth/Sex: Treating RN: 10/02/1975 (46 y.o. [name] Primary Care Eron Goble: Wells Guiles Other Clinician: Referring Amamda Curbow: Treating Tashawnda Bleiler/Extender: [name] in Treatment: 34 Vital Signs Height(in): Pulse(bpm): 69 Weight(lbs): 170  Blood Pressure(mmHg): 109/60 Body Mass Index(BMI): Temperature(F): 97.4 Respiratory Rate(breaths/min): 18 Photos: [N/A:N/A] Left Ankle N/A N/A Wound Location: Trauma N/A N/A Wounding Event: Trauma, Other N/A N/A Primary Etiology: 10/14/2020 N/A N/A Date Acquired: 79 N/A N/A Weeks of Treatment: Open N/A N/A Wound Status: No N/A N/A Wound Recurrence: 1.4x2x0.2 N/A N/A Measurements L x W x D (cm) 2.199 N/A N/A A (cm) : rea 0.44 N/A N/A Volume (cm) : 96.40% N/A N/A % Reduction in A rea: 98.20% N/A N/A % Reduction in Volume: Full Thickness Without Exposed N/A N/A Classification: Support Structures Medium N/A N/A Exudate A mount: Serosanguineous N/A N/A Exudate Type: red, brown N/A N/A Exudate Color: Flat and Intact N/A N/A Wound Margin: Large (67-100%) N/A N/A Granulation A mount: Red N/A N/A Granulation Quality: Small (1-33%) N/A N/A Necrotic A mount: Fat Layer (Subcutaneous Tissue): Yes N/A N/A Exposed Structures: Fascia: No Tendon: No Muscle: No Joint: No Bone: No Small (1-33%) N/A N/A Epithelialization: Debridement - Excisional  N/A N/A Debridement: Pre-procedure Verification/Time Out 12:00 N/A N/A Taken: Lidocaine 4% Topical Solution N/A N/A Pain Control: Subcutaneous, Slough N/A N/A Tissue Debrided: Skin/Subcutaneous Tissue N/A N/A Level: 2.8 N/A N/A Debridement A (sq cm): rea Curette N/A N/A Instrument: Minimum N/A N/A Bleeding: Pressure N/A N/A Hemostasis A chieved: 3 N/A N/A Procedural Pain: 1 N/A N/A Post Procedural Pain: Procedure was tolerated well N/A N/A Debridement Treatment Response: 1.4x2x0.2 N/A N/A Post Debridement Measurements L x W x D (cm) 0.44 N/A N/A Post Debridement Volume: (cm) Compression Therapy N/A N/A Procedures Performed: Debridement Treatment Notes Electronic Signature(s) Signed: 04/15/2022 12:21:51 PM By: [name] FACS Signed: 04/15/2022 5:22:18 PM By: [name], BSN Entered By: Duanne Guess on 04/15/2022 12:21:51 -------------------------------------------------------------------------------- Multi-Disciplinary Care Plan Details Patient Name: Date of Service: [name] 04/15/2022 11:15 A M Medical Record Number: [id number] Patient Account Number: [id number] Date of Birth/Sex: Treating RN: [birth year] (46 y.o. 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 Compression as ordered Treatment Activities: Therapeutic compression applied : 02/19/2022 Notes: Wound/Skin  Impairment Nursing Diagnoses: Knowledge deficit related to ulceration/compromised skin integrity Goals: Patient/caregiver will verbalize understanding of skin care regimen Date Initiated: 02/05/2021 Target Resolution Date: 04/23/2022 Goal Status: Active Interventions: Assess patient/caregiver ability to obtain necessary supplies Assess patient/caregiver ability to perform ulcer/skin care regimen upon admission and as needed Provide education on ulcer and skin care Treatment Activities: Skin care regimen initiated : 02/05/2021 Topical wound management initiated : 02/05/2021 Notes: 03/31/21: Wound care regimen ongoing, target date extended. 04/21/21: Wound care ongoing, through interpreter patient states he is doing fine with his dressing changes. Electronic Signature(s) Signed: 04/15/2022 5:22:18 PM By: [name], BSN Entered By: Zenaida Deed on 04/15/2022 11:54:18 -------------------------------------------------------------------------------- Pain Assessment Details Patient Name: Date of Service: [name] 04/15/2022 11:15 A M Medical Record Number: [id number] Patient Account Number: [id number] Date of Birth/Sex: Treating RN: 11-08-1975 (46 y.o. [name] Primary Care Kristyana Notte: [name] Other Clinician: Referring Kaleb Linquist: Treating Elai Vanwyk/Extender: Horton Marshall in Treatment: 31 Active Problems Location of Pain Severity and Description of Pain Patient Has  Paino No Site Locations Pain Management and Medication Current Pain Management: Electronic Signature(s) Signed: 04/15/2022 4:42:06 PM By: Thayer Dallas Signed: 04/15/2022 5:22:18 PM By: [name], BSN Entered By: Thayer Dallas on 04/15/2022 11:42:06 -------------------------------------------------------------------------------- Patient/Caregiver Education Details Patient Name: Date of Service: [name] 9/14/2023andnbsp11:15 A M Medical  Record Number: [id number] Patient Account Number: [id number] Date of Birth/Gender: Treating RN: Jan 20, 1976 (46 y.o. [name] Primary Care Physician: [name] Other Clinician: Referring Physician: Treating Physician/Extender: Horton Marshall in Treatment: 84 Education Assessment Education Provided To: Patient Education Topics Provided Venous: Methods: Explain/Verbal Responses: Reinforcements needed, State content correctly Wound/Skin Impairment: Methods: Explain/Verbal Responses: Reinforcements needed, State content correctly Electronic Signature(s) Signed: 04/15/2022 5:22:18 PM By: [name], BSN Entered By: Zenaida Deed on 04/15/2022 11:54:45 -------------------------------------------------------------------------------- Wound Assessment Details Patient Name: Date of Service: [name] 04/15/2022 11:15 A M Medical Record Number: [id number] Patient Account Number: [id number] Date of Birth/Sex: Treating RN: 10-17-1975 (46 y.o. [name] Primary Care Rc Amison: [name] Other Clinician: Referring Zemirah Krasinski: Treating Arnetra Terris/Extender: Horton Marshall in Treatment: 90 Wound Status Wound Number: 1 Primary Etiology: Trauma, Other Wound Location: Left Ankle Wound Status: Open Wounding Event: Trauma Date Acquired: 10/14/2020 Weeks Of Treatment: 62 Clustered Wound: No Photos Wound Measurements Length: (cm) 1.4 Width: (cm) 2 Depth: (cm) 0.2 Area: (cm) 2.199 Volume: (cm) 0.44 % Reduction in Area: 96.4% % Reduction in Volume: 98.2% Epithelialization: Small (1-33%) Tunneling: No Undermining: No Wound Description Classification: Full Thickness Without Exposed Support Structures Wound Margin: Flat and Intact Exudate Amount: Medium Exudate Type: Serosanguineous Exudate Color: red, brown Foul Odor After Cleansing: No Slough/Fibrino Yes Wound Bed Granulation Amount: Large  (67-100%) Exposed Structure Granulation Quality: Red Fascia Exposed: No Necrotic Amount: Small (1-33%) Fat Layer (Subcutaneous Tissue) Exposed: Yes Necrotic Quality: Adherent Slough Tendon Exposed: No Muscle Exposed: No Joint Exposed: No Bone Exposed: No Treatment Notes Wound #1 (Ankle) Wound Laterality: Left Cleanser Soap and Water Discharge Instruction: May shower and wash wound with dial antibacterial soap and water prior to dressing change. Wound Cleanser Discharge Instruction: Cleanse the wound with wound cleanser prior to applying a clean dressing using gauze sponges, not tissue or cotton balls. Peri-Wound Care Zinc Oxide Ointment 30g tube Discharge Instruction: Apply Zinc Oxide to periwound with each dressing change Sween Lotion (Moisturizing lotion) Discharge Instruction: Apply moisturizing lotion as directed Topical Primary Dressing Endoform 2x2 in Discharge Instruction: Moisten with saline Secondary Dressing Drawtex 4x4 in Discharge Instruction: Apply over primary dressing as directed. Zetuvit Plus 4x8 in Discharge Instruction: Apply over primary dressing as directed. Secured With Compression Wrap ThreePress (3 layer compression wrap) Discharge Instruction: Apply three layer compression as directed. Compression Stockings Add-Ons Electronic Signature(s) Signed: 04/15/2022 4:42:06 PM By: Thayer Dallas Signed: 04/15/2022 5:22:18 PM By: [name], BSN Entered By: Thayer Dallas on 04/15/2022 11:53:43 -------------------------------------------------------------------------------- Vitals Details Patient Name: Date of Service: [name], [name] 04/15/2022 11:15 A M Medical Record Number: [id number] Patient Account Number: [id number] Date of Birth/Sex: Treating RN: Jun 02, 1976 (46 y.o. [name] Primary Care Blonnie Maske: [name] Other Clinician: Referring Cecelia Graciano: Treating Trae Bovenzi/Extender: Horton Marshall in  Treatment: 54 Vital Signs Time Taken: 11:38 Temperature (F): 97.4 Weight (lbs): 170 Pulse (bpm): 69 Respiratory Rate (breaths/min): 18 Blood Pressure (mmHg): 109/60 Reference Range: 80 - 120 mg / dl Electronic Signature(s) Signed: 04/15/2022 4:42:06 PM By: Thayer Dallas Entered By: Thayer Dallas on 04/15/2022 11:41:58

## 2022-04-16 NOTE — Progress Notes (Signed)
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 Provider/Extender: Horton Marshall in Treatment: 50 Information Obtained From Patient Gastrointestinal Medical History: Past Medical History Notes: Chronic Gastritis Immunizations Pneumococcal Vaccine: Received Pneumococcal Vaccination: No Implantable Devices No devices added Family and Social History Unknown History: Yes; Current every day smoker; Marital Status - Married; Alcohol Use: Rarely; Drug Use: No History; Caffeine Use: Moderate; Financial Concerns: No; Food, Clothing or Shelter Needs: No; Support System Lacking: No; Transportation Concerns: No Electronic Signature(s) Signed: 04/15/2022 12:58:04 PM By: [name] FACS Signed: 04/15/2022 5:22:18 PM By: [name], BSN Entered By: Duanne Guess on 04/15/2022  12:23:10 -------------------------------------------------------------------------------- SuperBill Details Patient Name: Date of Service: [name], [name] 04/15/2022 Medical Record Number: [id number] Patient Account  Number: [id number] Date of Birth/Sex: Treating RN: May 31, 1976 (46 y.o. [name] Primary Care Provider: [name] Other Clinician: Referring Provider: Treating Provider/Extender: Horton Marshall in Treatment: 34 Diagnosis Coding ICD-10 Codes Code Description [phone] Non-pressure chronic ulcer of left ankle with other specified severity I87.332 Chronic venous hypertension (idiopathic) with ulcer and inflammation of left lower extremity Facility Procedures CPT4 Code: [id number] Description: 11042 - DEB SUBQ TISSUE 20 SQ CM/< ICD-10 Diagnosis Description L97.328 Non-pressure chronic ulcer of left ankle with other specified severity Modifier: Quantity: 1 Physician Procedures : CPT4 Code Description Modifier [id number] 99213 - WC PHYS LEVEL 3 - EST PT 25 ICD-10 Diagnosis Description L97.328 Non-pressure chronic ulcer of left ankle with other specified severity I87.332 Chronic venous hypertension (idiopathic) with ulcer and  inflammation of left lower extremity Quantity: 1 : [id number] 11042 - WC PHYS SUBQ TISS 20 SQ CM ICD-10 Diagnosis Description L97.328 Non-pressure chronic ulcer of left ankle with other specified severity Quantity: 1 Electronic Signature(s) Signed: 04/15/2022 12:25:12 PM By: [name] FACS Entered By: Duanne Guess on 04/15/2022 12:25:11

## 2022-04-22 ENCOUNTER — Encounter (HOSPITAL_BASED_OUTPATIENT_CLINIC_OR_DEPARTMENT_OTHER): Payer: Medicaid Other | Admitting: General Surgery

## 2022-04-22 DIAGNOSIS — I872 Venous insufficiency (chronic) (peripheral): Secondary | ICD-10-CM | POA: Diagnosis not present

## 2022-04-22 DIAGNOSIS — L97322 Non-pressure chronic ulcer of left ankle with fat layer exposed: Secondary | ICD-10-CM | POA: Diagnosis not present

## 2022-04-22 DIAGNOSIS — I87332 Chronic venous hypertension (idiopathic) with ulcer and inflammation of left lower extremity: Secondary | ICD-10-CM | POA: Diagnosis not present

## 2022-04-22 DIAGNOSIS — L97328 Non-pressure chronic ulcer of left ankle with other specified severity: Secondary | ICD-10-CM | POA: Diagnosis not present

## 2022-04-22 NOTE — Progress Notes (Signed)
STERLIN, KNIGHTLY ([id number]) Visit Report for 04/22/2022 Arrival Information Details Patient Name: Date of Service: [name] 04/22/2022 8:15 A M Medical Record Number: [id number] Patient Account Number: [id number] Date of Birth/Sex: Treating RN: February 03, 1976 (46 y.o. [name] Primary Care Jaclynn Laumann: Wells Guiles Other Clinician: Referring Briceyda Abdullah: Treating Lalita Ebel/Extender: [name] in Treatment: 4 Visit Information History Since Last Visit All ordered tests and consults were completed: Yes Patient Arrived: Ambulatory Added or deleted any medications: No Arrival Time: 08:33 Any new allergies or adverse reactions: No Transfer Assistance: None Had a fall or experienced change in No Patient Requires Transmission-Based Precautions: No activities of daily living that may affect Patient Has Alerts: No risk of falls: Signs or symptoms of abuse/neglect since last visito No Hospitalized since last visit: No Implantable device outside of the clinic excluding No cellular tissue based products placed in the center since last visit: Has Dressing in Place as Prescribed: Yes Has Compression in Place as Prescribed: Yes Pain Present Now: No Electronic Signature(s) Signed: 04/22/2022 4:18:53 PM By: [name] RN Entered By: [name] on 04/22/2022 08:33:58 -------------------------------------------------------------------------------- Compression Therapy Details Patient Name: Date of Service: [name] [name] 04/22/2022 8:15 A M Medical Record Number: [id number] Patient Account Number: [id number] Date of Birth/Sex: Treating RN: 06-22-1976 (46 y.o. [name] Primary Care Dalyla Chui: Wells Guiles Other Clinician: Referring Abbigale Mcelhaney: Treating Nirvaan Frett/Extender: [name] in Treatment: 9 Compression Therapy Performed for Wound Assessment: Wound #1 Left Ankle Performed By: Clinician Baruch Gouty, RN Compression Type: Three Layer Post Procedure Diagnosis Same as Pre-procedure Electronic Signature(s) Signed: 04/22/2022 5:15:24 PM By: Baruch Gouty RN, BSN Entered By: Baruch Gouty on 04/22/2022 08:45:36 -------------------------------------------------------------------------------- Encounter Discharge Information Details Patient Name: Date of Service: [name] [name] 04/22/2022 8:15 A M Medical Record Number: [id number] Patient Account Number: [id number] Date of Birth/Sex: Treating RN: [birth year] (46 y.o. [name] Primary Care Liara Holm: Wells Guiles Other Clinician: Referring Edye Hainline: Treating Margarette Vannatter/Extender: [name] in Treatment: 100 Encounter Discharge Information Items Post Procedure Vitals Discharge Condition: Stable Temperature (F): 97.6 Ambulatory Status: Ambulatory Pulse (bpm): 73 Discharge Destination: Home Respiratory Rate (breaths/min): 18 Transportation: Private Auto Blood Pressure (mmHg): 108/68 Accompanied By: self Schedule Follow-up Appointment: Yes Clinical Summary of Care: Patient Declined Electronic Signature(s) Signed: 04/22/2022 5:15:24 PM By: Baruch Gouty RN, BSN Entered By: Baruch Gouty on 04/22/2022 09:09:35 -------------------------------------------------------------------------------- Lower Extremity Assessment Details Patient Name: Date of Service: [name] [name] 04/22/2022 8:15 A M Medical Record Number: [id number] Patient Account Number: [id number] Date of Birth/Sex: Treating RN: 03/31/1976 (46 y.o. [name] Primary Care Vandana Haman: Wells Guiles Other Clinician: Referring Dereck Agerton: Treating Caya Soberanis/Extender: [name] in Treatment: 32 Edema Assessment Assessed: Shirlyn Goltz: No] [name]: No] Edema: [Left: Ye] [Right: s] Calf Left: Right: Point of Measurement: 28 cm From Medial Instep 30 cm Ankle Left: Right: Point of  Measurement: 8 cm From Medial Instep 21 cm Vascular Assessment Pulses: Dorsalis Pedis Palpable: [Left:Yes] Electronic Signature(s) Signed: 04/22/2022 4:18:53 PM By: [name] RN Entered By: [name] on 04/22/2022 08:35:09 -------------------------------------------------------------------------------- Multi Wound Chart Details Patient Name: Date of Service: [name] [name] 04/22/2022 8:15 A M Medical Record Number: [id number] Patient Account Number: [id number] Date of Birth/Sex: Treating RN: [birth year] (46 y.o. [name] Primary Care Nardos Putnam: Wells Guiles Other Clinician: Referring Bayler Gehrig: Treating Dasiah Hooley/Extender: [name] in Treatment: 33 Vital Signs Height(in): Pulse(bpm): 73 Weight(lbs): 170 Blood  Pressure(mmHg): 108/68 Body Mass Index(BMI): Temperature(F): 97.6 Respiratory Rate(breaths/min): 16 Photos: [N/A:N/A] Left Ankle N/A N/A Wound Location: Trauma N/A N/A Wounding Event: Trauma, Other N/A N/A Primary Etiology: 10/14/2020 N/A N/A Date Acquired: 76 N/A N/A Weeks of Treatment: Open N/A N/A Wound Status: No N/A N/A Wound Recurrence: 2.3x1.9x0.2 N/A N/A Measurements L x W x D (cm) 3.432 N/A N/A A (cm) : rea 0.686 N/A N/A Volume (cm) : 94.40% N/A N/A % Reduction in A rea: 97.20% N/A N/A % Reduction in Volume: Full Thickness Without Exposed N/A N/A Classification: Support Structures Medium N/A N/A Exudate A mount: Serosanguineous N/A N/A Exudate Type: red, brown N/A N/A Exudate Color: Flat and Intact N/A N/A Wound Margin: Large (67-100%) N/A N/A Granulation A mount: Red N/A N/A Granulation Quality: Small (1-33%) N/A N/A Necrotic A mount: Fat Layer (Subcutaneous Tissue): Yes N/A N/A Exposed Structures: Fascia: No Tendon: No Muscle: No Joint: No Bone: No Small (1-33%) N/A N/A Epithelialization: Debridement - Selective/Open Wound N/A N/A Debridement: Pre-procedure  Verification/Time Out 08:40 N/A N/A Taken: Lidocaine 4% Topical Solution N/A N/A Pain Control: Slough N/A N/A Tissue Debrided: Non-Viable Tissue N/A N/A Level: 4.37 N/A N/A Debridement A (sq cm): rea Curette N/A N/A Instrument: Minimum N/A N/A Bleeding: Pressure N/A N/A Hemostasis A chieved: 3 N/A N/A Procedural Pain: 1 N/A N/A Post Procedural Pain: Procedure was tolerated well N/A N/A Debridement Treatment Response: 2.3x1.9x0.2 N/A N/A Post Debridement Measurements L x W x D (cm) 0.686 N/A N/A Post Debridement Volume: (cm) Compression Therapy N/A N/A Procedures Performed: Debridement Treatment Notes Electronic Signature(s) Signed: 04/22/2022 9:06:26 AM By: [name] FACS Signed: 04/22/2022 5:15:24 PM By: [name], BSN Entered By: Duanne Guess on 04/22/2022 09:06:25 -------------------------------------------------------------------------------- Multi-Disciplinary Care Plan Details Patient Name: Date of Service: [name] 04/22/2022 8:15 A M Medical Record Number: [id number] Patient Account Number: [id number] Date of Birth/Sex: Treating RN: 10-Jun-1976 (46 y.o. [name] Primary Care Khalif Stender: [name] Other Clinician: Referring Madgie Dhaliwal: Treating Anias Bartol/Extender: Horton Marshall in Treatment: 68 Multidisciplinary Care Plan reviewed with physician Active Inactive Venous Leg Ulcer Nursing Diagnoses: Actual venous Insuffiency (use after diagnosis is confirmed) Knowledge deficit related to disease process and management Goals: Patient will maintain optimal edema control Date Initiated: 02/19/2022 Target Resolution Date: 05/21/2022 Goal Status: Active Interventions: Assess peripheral edema status every visit. Compression as ordered Treatment Activities: Therapeutic compression applied : 02/19/2022 Notes: Wound/Skin Impairment Nursing Diagnoses: Knowledge deficit related to  ulceration/compromised skin integrity Goals: Patient/caregiver will verbalize understanding of skin care regimen Date Initiated: 02/05/2021 Target Resolution Date: 05/21/2022 Goal Status: Active Interventions: Assess patient/caregiver ability to obtain necessary supplies Assess patient/caregiver ability to perform ulcer/skin care regimen upon admission and as needed Provide education on ulcer and skin care Treatment Activities: Skin care regimen initiated : 02/05/2021 Topical wound management initiated : 02/05/2021 Notes: 03/31/21: Wound care regimen ongoing, target date extended. 04/21/21: Wound care ongoing, through interpreter patient states he is doing fine with his dressing changes. Electronic Signature(s) Signed: 04/22/2022 5:15:24 PM By: [name], BSN Entered By: Zenaida Deed on 04/22/2022 08:47:41 -------------------------------------------------------------------------------- Pain Assessment Details Patient Name: Date of Service: [name] 04/22/2022 8:15 A M Medical Record Number: [id number] Patient Account Number: [id number] Date of Birth/Sex: Treating RN: 08-29-1975 (46 y.o. Valma Cava Primary Care Guled Gahan: [name] Other Clinician: Referring Nou Chard: Treating Kaiea Esselman/Extender: Horton Marshall in Treatment: 42 Active Problems Location of Pain Severity and Description of Pain Patient Has Paino  No Site Locations Pain Management and Medication Current Pain Management: Electronic Signature(s) Signed: 04/22/2022 4:18:53 PM By: Tommie Ard RN Entered By: Tommie Ard on 04/22/2022 08:34:46 -------------------------------------------------------------------------------- Patient/Caregiver Education Details Patient Name: Date of Service: [name] 9/21/2023andnbsp8:15 A M Medical Record Number: [id number] Patient Account Number: [id number] Date of Birth/Gender: Treating RN: 26-Aug-1975 (46 y.o. [name] Primary Care Physician: [name] Other Clinician: Referring Physician: Treating Physician/Extender: Horton Marshall in Treatment: 24 Education Assessment Education Provided To: Patient Education Topics Provided Venous: Methods: Explain/Verbal Responses: Reinforcements needed, State content correctly Electronic Signature(s) Signed: 04/22/2022 5:15:24 PM By: [name], BSN Entered By: Zenaida Deed on 04/22/2022 08:48:07 -------------------------------------------------------------------------------- Wound Assessment Details Patient Name: Date of Service: [name] 04/22/2022 8:15 A M Medical Record Number: [id number] Patient Account Number: [id number] Date of Birth/Sex: Treating RN: 1975-12-27 (46 y.o. Valma Cava Primary Care Caylee Vlachos: [name] Other Clinician: Referring Tamikia Chowning: Treating Rockne Dearinger/Extender: Horton Marshall in Treatment: 70 Wound Status Wound Number: 1 Primary Etiology: Trauma, Other Wound Location: Left Ankle Wound Status: Open Wounding Event: Trauma Date Acquired: 10/14/2020 Weeks Of Treatment: 63 Clustered Wound: No Photos Wound Measurements Length: (cm) 2.3 Width: (cm) 1.9 Depth: (cm) 0.2 Area: (cm) 3.432 Volume: (cm) 0.686 % Reduction in Area: 94.4% % Reduction in Volume: 97.2% Epithelialization: Small (1-33%) Tunneling: No Undermining: No Wound Description Classification: Full Thickness Without Exposed Support Structures Wound Margin: Flat and Intact Exudate Amount: Medium Exudate Type: Serosanguineous Exudate Color: red, brown Foul Odor After Cleansing: No Slough/Fibrino Yes Wound Bed Granulation Amount: Large (67-100%) Exposed Structure Granulation Quality: Red Fascia Exposed: No Necrotic Amount: Small (1-33%) Fat Layer (Subcutaneous Tissue) Exposed: Yes Necrotic Quality: Adherent Slough Tendon Exposed: No Muscle Exposed:  No Joint Exposed: No Bone Exposed: No Treatment Notes Wound #1 (Ankle) Wound Laterality: Left Cleanser Soap and Water Discharge Instruction: May shower and wash wound with dial antibacterial soap and water prior to dressing change. Wound Cleanser Discharge Instruction: Cleanse the wound with wound cleanser prior to applying a clean dressing using gauze sponges, not tissue or cotton balls. Peri-Wound Care Sween Lotion (Moisturizing lotion) Discharge Instruction: Apply moisturizing lotion as directed Topical Primary Dressing Endoform 2x2 in Discharge Instruction: Moisten with saline Secondary Dressing Drawtex 4x4 in Discharge Instruction: Apply over primary dressing as directed. Zetuvit Plus 4x8 in Discharge Instruction: Apply over primary dressing as directed. Secured With Compression Wrap ThreePress (3 layer compression wrap) Discharge Instruction: Apply three layer compression as directed. Compression Stockings Add-Ons Electronic Signature(s) Signed: 04/22/2022 4:18:53 PM By: Tommie Ard RN Entered By: Tommie Ard on 04/22/2022 08:33:29 -------------------------------------------------------------------------------- Vitals Details Patient Name: Date of Service: [name] 04/22/2022 8:15 A M Medical Record Number: [id number] Patient Account Number: [id number] Date of Birth/Sex: Treating RN: Sep 23, 1975 (46 y.o. Valma Cava Primary Care Maragret Vanacker: [name] Other Clinician: Referring Debbe Crumble: Treating Elie Gragert/Extender: Horton Marshall in Treatment: 23 Vital Signs Time Taken: 08:20 Temperature (F): 97.6 Weight (lbs): 170 Pulse (bpm): 73 Respiratory Rate (breaths/min): 16 Blood Pressure (mmHg): 108/68 Reference Range: 80 - 120 mg / dl Electronic Signature(s) Signed: 04/22/2022 4:18:53 PM By: Tommie Ard RN Entered By: Tommie Ard on 04/22/2022 08:34:37

## 2022-04-22 NOTE — Progress Notes (Signed)
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 Guess on 04/22/2022  09:09:11 -------------------------------------------------------------------------------- SuperBill Details Patient Name: Date of Service: [name] 04/22/2022 Medical Record Number: [id number] Patient Account Number: [id number] Date of Birth/Sex: Treating RN:  1976-05-18 (46 y.o. [name] Primary Care Provider: [name] Other Clinician: Referring Provider: Treating Provider/Extender: Horton Marshall in Treatment: 46 Diagnosis Coding ICD-10 Codes Code Description [phone] Non-pressure chronic ulcer of left ankle with other specified severity I87.332 Chronic venous hypertension (idiopathic) with ulcer and inflammation of left lower extremity Facility Procedures CPT4 Code: [id number] Description: [phone] - DEBRIDE WOUND 1ST 20 SQ CM OR < ICD-10 Diagnosis Description L97.328 Non-pressure chronic ulcer of left ankle with other specified severity Modifier: Quantity: 1 Physician Procedures : CPT4 Code Description Modifier [id number] - WC PHYS LEVEL 3 - EST PT 25 ICD-10 Diagnosis Description L97.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

## 2022-04-25 ENCOUNTER — Encounter (HOSPITAL_COMMUNITY): Payer: Self-pay | Admitting: Physician Assistant

## 2022-04-25 ENCOUNTER — Emergency Department (HOSPITAL_COMMUNITY)
Admission: EM | Admit: 2022-04-25 | Discharge: 2022-04-25 | Discharge status: Against Medical Advice | Payer: Medicaid Other | Attending: Emergency Medicine | Admitting: Emergency Medicine

## 2022-04-25 ENCOUNTER — Encounter (HOSPITAL_COMMUNITY): Payer: Self-pay | Admitting: Emergency Medicine

## 2022-04-25 ENCOUNTER — Ambulatory Visit (HOSPITAL_COMMUNITY)
Admission: EM | Admit: 2022-04-25 | Discharge: 2022-04-25 | Disposition: A | Discharge status: Other Acute Inpatient Hospital | Payer: Medicaid Other | Attending: Physician Assistant | Admitting: Physician Assistant

## 2022-04-25 ENCOUNTER — Emergency Department (HOSPITAL_COMMUNITY): Payer: Medicaid Other

## 2022-04-25 ENCOUNTER — Other Ambulatory Visit: Payer: Self-pay

## 2022-04-25 DIAGNOSIS — R079 Chest pain, unspecified: Secondary | ICD-10-CM | POA: Insufficient documentation

## 2022-04-25 DIAGNOSIS — Z5321 Procedure and treatment not carried out due to patient leaving prior to being seen by health care provider: Secondary | ICD-10-CM | POA: Insufficient documentation

## 2022-04-25 LAB — URINALYSIS, ROUTINE W REFLEX MICROSCOPIC
Bilirubin Urine: NEGATIVE
Glucose, UA: NEGATIVE mg/dL
Ketones, ur: NEGATIVE mg/dL
Leukocytes,Ua: NEGATIVE
Nitrite: NEGATIVE
Protein, ur: NEGATIVE mg/dL
Specific Gravity, Urine: 1.011 (ref 1.005–1.030)
pH: 7 (ref 5.0–8.0)

## 2022-04-25 LAB — BASIC METABOLIC PANEL
Anion gap: 5 (ref 5–15)
BUN: 9 mg/dL (ref 6–20)
CO2: 19 mmol/L — ABNORMAL LOW (ref 22–32)
Calcium: 9.4 mg/dL (ref 8.9–10.3)
Chloride: 116 mmol/L — ABNORMAL HIGH (ref 98–111)
Creatinine, Ser: 1.07 mg/dL (ref 0.61–1.24)
GFR, Estimated: >60 mL/min (ref >60)
Glucose, Bld: 98 mg/dL (ref 70–99)
Potassium: 3.6 mmol/L (ref 3.5–5.1)
Sodium: 140 mmol/L (ref 135–145)

## 2022-04-25 LAB — CBC
HCT: 46.1 % (ref 39.0–52.0)
Hemoglobin: 15.3 g/dL (ref 13.0–17.0)
MCH: 33.3 pg (ref 26.0–34.0)
MCHC: 33.2 g/dL (ref 30.0–36.0)
MCV: 100.4 fL — ABNORMAL HIGH (ref 80.0–100.0)
Platelets: 219 10*3/uL (ref 150–400)
RBC: 4.59 MIL/uL (ref 4.22–5.81)
RDW: 11.7 % (ref 11.5–15.5)
WBC: 4 10*3/uL (ref 4.0–10.5)
nRBC: 0 % (ref 0.0–0.2)

## 2022-04-25 LAB — TROPONIN I (HIGH SENSITIVITY): Troponin I (High Sensitivity): 4 ng/L (ref <18)

## 2022-04-25 LAB — LIPASE, BLOOD: Lipase: 31 U/L (ref 11–51)

## 2022-04-25 NOTE — ED Notes (Signed)
Patient is being discharged from the Urgent Care and sent to the Emergency Department via wheelchair with RN . Per E. Raspet, PA, patient is in need of higher level of care due to chest pain and epigastric pain. Patient is aware and verbalizes understanding of plan of care.  Vitals:   04/25/22 1302  BP: 115/65  Pulse: 61  SpO2: 99%

## 2022-04-25 NOTE — ED Notes (Incomplete)
Provider and RN repeatedly explaining to family member how important it is that pt have ED eval. Family member continually stating he has another place to be and wishes to wait until tomorrow. Explained to family member repeatedly how important it is that pt go directly to ED now. Family continues to be concerned about transportation issues and will not agree to take pt to ED. Video interpreter obtained, and pt states he would rather go to ED via ambulance.

## 2022-04-25 NOTE — ED Provider Notes (Signed)
I was called to triage to evaluate patient for chest pain.  His son was initially in the room who provided translation.  Reports a 2-day history of epigastric and substernal chest pain with intermittent nausea and vomiting.  Son is unaware of any medical problems but patient is wearing an Haematologist but are unsure why he requires his care.  Reports that it is being changed once a week.  He is unable to tell me his doctor and there is no information in EMR.  EKG was obtained that showed sinus bradycardia with ventricular rate of 56 bpm without ischemic changes; no previous to compare.  Initially son and father kept requesting that they make an appointment at the emergency room and we discussed that this is not a possibility.  They then asked if he could drive home and then come back in the future which we recommended against in case patient were to decompensated and reinforced that he needed evaluation immediately.  Ultimately son decided that he would walk home and video Swahili interpreter was brought into the room.  After reviewing the discussion again through [place] interpreter patient was agreeable to going to the emergency room and initially requested [facility].  I was then informed by nursing staff that he preferred to drive himself and [facility] transport was canceled.  He will go directly to the emergency room for further evaluation of management of chest pain.  His vital signs were stable at the time of discharge.   Terrilee Croak, PA-C 04/25/22 1336

## 2022-04-25 NOTE — ED Triage Notes (Addendum)
Pt and family decline interpreter at this time. Per family: states pt C/O chest pain intermittently with occasional vomiting x 2 days. Pt points to epigastric region. States he feels some improvement after vomiting; denies any pain at present.  RN noticed possible unna boot to LLE - pt and family are unsure what the problem is, but states he gets it checked/changed weekly somewhere - family states this problem has been ongoing from when pt was in [place].

## 2022-04-25 NOTE — ED Triage Notes (Signed)
W/ use of swahilli interpreter, patient here with complaints of chest pain intermittently, and n/v for 2 days. Patient points to epigastric area radiating to the back. Patient describes the pain as getting worse after eating or drinking things. Denies SHOB at this time.

## 2022-04-25 NOTE — ED Notes (Signed)
Patient seen walking out of the department with visitor while this tech returned to the lobby.

## 2022-04-25 NOTE — ED Notes (Signed)
Patient left without being seen.

## 2022-04-25 NOTE — ED Notes (Signed)
Pt and son informed pt needs to go to ED for further eval. Son stated he has somewhere he has to be, and that they will make an appt. Explained to son that he needs to go to ED immediately, for pt's safety. Son repeatedly stating that he's not able to take the pt there bc he has somewhere to be; staff recommended son take him and drop him off, but son continued to state he could not do that. Son states they are going to wait until tomorrow. Explained to son that they will have to sign out AMA, as we recommend he go to ED immediately, due to risk of serious complication or death. [employer] video interpreter brought to exam room and again explained to pt (without son) the importance of going to ED for eval. Pt verbalized understanding, and states he is willing to go by ambulance. Once RN stated she was going to start an IV, pt states he has concern for cost of ambulance, and wishes to drive himself, and that his son left. Provider notified, and [facility] tranport cancelled. Upon giving repeated instructions via interpreter on to pt in how to get to ED, pt is not verbalizing understanding, therefore, pt transported to ED via wheelchair with RN assistance.

## 2022-04-26 ENCOUNTER — Encounter: Payer: Self-pay | Admitting: Surgery

## 2022-04-26 ENCOUNTER — Other Ambulatory Visit: Payer: Self-pay

## 2022-04-26 ENCOUNTER — Emergency Department (HOSPITAL_COMMUNITY)
Admission: EM | Admit: 2022-04-26 | Discharge: 2022-04-27 | Discharge status: Against Medical Advice | Payer: Medicaid Other

## 2022-04-26 NOTE — ED Notes (Signed)
Called pt for triage, no answer 

## 2022-04-26 NOTE — ED Notes (Signed)
No answer for triage x2 

## 2022-04-26 NOTE — ED Notes (Signed)
No answer for triage x3 

## 2022-04-27 NOTE — ED Notes (Signed)
Pt. Left and then came back put back into the system and then left again.

## 2022-04-30 ENCOUNTER — Encounter (HOSPITAL_BASED_OUTPATIENT_CLINIC_OR_DEPARTMENT_OTHER): Payer: Medicaid Other | Admitting: General Surgery

## 2022-05-01 ENCOUNTER — Other Ambulatory Visit: Payer: Self-pay

## 2022-05-01 ENCOUNTER — Emergency Department (HOSPITAL_COMMUNITY)
Admission: EM | Admit: 2022-05-01 | Discharge: 2022-05-02 | Disposition: A | Discharge status: Home / Self Care | Payer: Medicaid Other | Attending: Emergency Medicine | Admitting: Emergency Medicine

## 2022-05-01 ENCOUNTER — Emergency Department (HOSPITAL_COMMUNITY): Payer: Medicaid Other

## 2022-05-01 DIAGNOSIS — E876 Hypokalemia: Secondary | ICD-10-CM | POA: Diagnosis not present

## 2022-05-01 DIAGNOSIS — R1013 Epigastric pain: Secondary | ICD-10-CM | POA: Diagnosis not present

## 2022-05-01 DIAGNOSIS — F17200 Nicotine dependence, unspecified, uncomplicated: Secondary | ICD-10-CM | POA: Diagnosis not present

## 2022-05-01 DIAGNOSIS — R079 Chest pain, unspecified: Secondary | ICD-10-CM | POA: Diagnosis not present

## 2022-05-01 DIAGNOSIS — R001 Bradycardia, unspecified: Secondary | ICD-10-CM | POA: Diagnosis not present

## 2022-05-01 DIAGNOSIS — R1011 Right upper quadrant pain: Secondary | ICD-10-CM | POA: Diagnosis not present

## 2022-05-01 LAB — CBC
HCT: 45.4 % (ref 39.0–52.0)
Hemoglobin: 15.3 g/dL (ref 13.0–17.0)
MCH: 32.7 pg (ref 26.0–34.0)
MCHC: 33.7 g/dL (ref 30.0–36.0)
MCV: 97 fL (ref 80.0–100.0)
Platelets: 219 10*3/uL (ref 150–400)
RBC: 4.68 MIL/uL (ref 4.22–5.81)
RDW: 11.7 % (ref 11.5–15.5)
WBC: 4.2 10*3/uL (ref 4.0–10.5)
nRBC: 0 % (ref 0.0–0.2)

## 2022-05-01 LAB — URINALYSIS, ROUTINE W REFLEX MICROSCOPIC
Bacteria, UA: NONE SEEN
Bilirubin Urine: NEGATIVE
Glucose, UA: NEGATIVE mg/dL
Ketones, ur: NEGATIVE mg/dL
Nitrite: NEGATIVE
Protein, ur: NEGATIVE mg/dL
Specific Gravity, Urine: 1.012 (ref 1.005–1.030)
pH: 7 (ref 5.0–8.0)

## 2022-05-01 LAB — TROPONIN I (HIGH SENSITIVITY): Troponin I (High Sensitivity): 3 ng/L (ref <18)

## 2022-05-01 LAB — COMPREHENSIVE METABOLIC PANEL
ALT: 16 U/L (ref 0–44)
AST: 21 U/L (ref 15–41)
Albumin: 3.8 g/dL (ref 3.5–5.0)
Alkaline Phosphatase: 53 U/L (ref 38–126)
Anion gap: 9 (ref 5–15)
BUN: 12 mg/dL (ref 6–20)
CO2: 23 mmol/L (ref 22–32)
Calcium: 9.4 mg/dL (ref 8.9–10.3)
Chloride: 111 mmol/L (ref 98–111)
Creatinine, Ser: 1.23 mg/dL (ref 0.61–1.24)
GFR, Estimated: >60 mL/min (ref >60)
Glucose, Bld: 90 mg/dL (ref 70–99)
Potassium: 2.8 mmol/L — ABNORMAL LOW (ref 3.5–5.1)
Sodium: 143 mmol/L (ref 135–145)
Total Bilirubin: 0.6 mg/dL (ref 0.3–1.2)
Total Protein: 6.5 g/dL (ref 6.5–8.1)

## 2022-05-01 LAB — LIPASE, BLOOD: Lipase: 34 U/L (ref 11–51)

## 2022-05-01 NOTE — ED Triage Notes (Signed)
Pt here for abd pain and emesis after eating. RN using swahili interpreter and unable to get more of a story due to connection w/ interpreter. Pt has tenderness noted to RUQ

## 2022-05-01 NOTE — ED Provider Triage Note (Signed)
Emergency Medicine Provider Triage Evaluation Note  [name] , a 46 y.o. male  was evaluated in triage.  Pt complains of mild chest pain and nausea with vomiting.  Swahili interpreter used throughout encounter.  Patient reportedly seen here approximate 3 to 4 days ago but states he is unsure as to why he was here.  Brief chart review shows history of chest pain.  No significant abdominal tenderness noted.  Chest pain and abdominal labs ordered.  Review of Systems  Positive: As above Negative: As above  Physical Exam  BP 110/75 (BP Location: Right Arm)   Pulse (!) 59   Temp 97.9 F (36.6 C) (Oral)   Resp 16   SpO2 98%  Gen:   Awake, no distress   Resp:  Normal effort  MSK:   Moves extremities without difficulty  Other:    Medical Decision Making  Medically screening exam initiated at 6:29 PM.  Appropriate orders placed.  [name] was informed that the remainder of the evaluation will be completed by another provider, this initial triage assessment does not replace that evaluation, and the importance of remaining in the ED until their evaluation is complete.     [name] Peng, PA-C 05/01/22 1859

## 2022-05-02 ENCOUNTER — Encounter (HOSPITAL_COMMUNITY): Payer: Self-pay

## 2022-05-02 ENCOUNTER — Emergency Department (HOSPITAL_COMMUNITY): Payer: Medicaid Other

## 2022-05-02 DIAGNOSIS — R1011 Right upper quadrant pain: Secondary | ICD-10-CM | POA: Diagnosis not present

## 2022-05-02 DIAGNOSIS — Z419 Encounter for procedure for purposes other than remedying health state, unspecified: Secondary | ICD-10-CM | POA: Diagnosis not present

## 2022-05-02 LAB — TROPONIN I (HIGH SENSITIVITY): Troponin I (High Sensitivity): 3 ng/L (ref <18)

## 2022-05-02 LAB — MAGNESIUM: Magnesium: 2.2 mg/dL (ref 1.7–2.4)

## 2022-05-02 MED ORDER — POTASSIUM CHLORIDE CRYS ER 20 MEQ PO TBCR
40.0000 meq | EXTENDED_RELEASE_TABLET | Freq: Once | ORAL | Status: AC
  Administered 2022-05-02: 40 meq via ORAL
  Filled 2022-05-02: qty 2

## 2022-05-02 MED ORDER — POTASSIUM CHLORIDE 10 MEQ/100ML IV SOLN
10.0000 meq | INTRAVENOUS | Status: AC
  Administered 2022-05-02 (×3): 10 meq via INTRAVENOUS
  Filled 2022-05-02 (×3): qty 100

## 2022-05-02 MED ORDER — OMEPRAZOLE 40 MG PO CPDR: 40.0000 mg | DELAYED_RELEASE_CAPSULE | Freq: Every day | ORAL | 0 refills | Status: DC

## 2022-05-02 MED ORDER — SODIUM CHLORIDE 0.9 % IV BOLUS
250.0000 mL | Freq: Once | INTRAVENOUS | Status: AC
  Administered 2022-05-02: 250 mL via INTRAVENOUS

## 2022-05-02 NOTE — Discharge Instructions (Addendum)
Your work-up today is reassuring.  It is possible you have an ulcer in your stomach which is giving you pain when you try to eat or drink anything.  Take the medication prescribed to you today to help with your pain.  Follow-up with your primary care provider.  If you do not have a primary care provider, a referral will be provided, you will need to call to schedule an appointment.   Kazi yako leo inatia moyo. Inawezekana una kidonda tumboni ambacho kinakupa maumivu unapojaribu kula au kunywa chochote.  [place] d[place] leo ili kusaidia na maumivu yako.  Fuatilia na mtoa huduma wako wa msingi. [place] huna mtoa huduma ya msingi, rufaa [place], utahitaji [place] simu ili [place] miadi.  Kidonda cha peptic ni kidonda kinachoumiza kwenye utando wa tumbo lako au sehemu ya kwanza ya utumbo wako mdogo. Ni sababu gani? Sababu za kawaida za hali hii ni pamoja na:  [place].  Kutumia dawa fulani za maumivu mara nyingi au [place].  Vivimbe adimu kwenye tumbo, utumbo [place], au [place]. Ni nini huongeza hatari? Dwana Curd mkubwa wa kupata hali hii ikiwa:  [place].  Kuwa na historia ya familia ya ugonjwa wa vidonda.  Kunywa pombe.  Amelazwa Marzetta Board chumba cha wagonjwa mahututi (ICU). [place] au dalili ni zipi? [place] ni pamoja na:  Maumivu ya moto [place] eneo kati ya kifua na kifungo cha tumbo. Maumivu yanaweza: o [place] (kuwa na subira). o Kuwa mbaya zaidi wakati tumbo lako ni tupu. o Kuwa mbaya zaidi usiku.  [place].  Kuhisi kuumwa na tumbo (kichefuchefu) na [place] Neena Rhymes).  Kuvimba. Macy Mis kidonda husababisha damu, inaweza kusababisha:  Jed [place] na kinyesi (kinyesi) ambacho ni cheusi na kinafanana na lami.  Tupa damu nyekundu.  Tupa nyenzo zinazofanana na kahawa.  Je, hii inatibiwaje? Matibabu ya hali hii inaweza kujumuisha:  [place] vitu vinavyoweza GLOVFIEPPIR [place], [place] vile: o Kuvuta sigara. o Kutumia dawa za maumivu. o Kunywa pombe au [place].  Dawa  za kupunguza asidi ya tumbo.  Dawa za antibiotic ikiwa kidonda kimesababishwa na maambukizi.  Utaratibu unaofanywa kwa kutumia mirija ndogo, inayonyumbulika ambayo ina [place] mwishoni (endoscopy ya juu). Hii inaweza kufanywa ikiwa una kidonda cha damu.  [place]. Hii inaweza [place] ikiwa: o [place] damu nyingi. o Kidonda kilisababisha tundu mahali fulani kwenye mfumo wa usagaji chakula. Fuata maagizo haya nyumbani:  Usinywe pombe ikiwa daktari wako atakuambia usinywe.  Punguza kiasi cha kafeini unachotumia.  Usivute sigara au kutumia bidhaa zozote zilizo na nikotini au tumbaku. Ikiwa unahitaji usaidizi wa Melina [employer], muulize daktari wako.  [place] dawa za dukani na zilizoagizwa tu kama ulivyoambiwa na daktari wako. o [place] au ubadilishe dawa zako isipokuwa uzungumze na daktari wako kuhusu hilo kwanza. o Usinywe aspirini, ibuprofen, au NSAID zingine isipokuwa daktari wako amekuambia ufanye hivyo.  [place] na ziara zote za ufuatiliaji. Omelia [place] na daktari ikiwa:  [place] nafuu ndani ya siku 7 baada ya kuanza matibabu.  Unaendelea kusumbuliwa na tumbo (kutokula chakula) au [place]. Pata usaidizi mara moja ikiwa:  Una maumivu ya ghafla, makali kwenye tumbo lako (tumbo).  Una maumivu ya tumbo ambayo hayaondoki.  Una kinyesi chenye damu (kinyesi) au [place], [place].  Unatupa damu. Epifania Gore kama misingi ya kahawa.  Unahisi kichwa chepesi au unahisi kama unaweza kuzimia [place]).  Unakuwa dhaifu.  [place] na [place] au kuhisi kunata na baridi kwa kuguswa (clammy).

## 2022-05-02 NOTE — ED Provider Notes (Signed)
[facility] EMERGENCY DEPARTMENT Provider Note   CSN: [id number] Arrival date & time: 05/01/22  1730     History  Chief Complaint  Patient presents with   Emesis   Abdominal Pain    [name] is a 45 y.o. male.  46 year old male presents with complaint of epigastric discomfort which radiates to right lower quadrant and has been ongoing for an undefined but prolonged amount of time presents with complaint of ongoing pain.  Pain is worse when he tries to eat or drink anything.  Reports he last vomited about 2 weeks ago, denies feeling nauseous but reports feeling like he is going to vomit frequently.  When asked if he has had any changes in his stools specifically any blood in his stools patient does not answer the question.  When asked if he takes NSAIDs, does not answer question.  No prior abdominal surgeries. Speaks Swahili, interpreter was used for entirety of history and physical exam however interpreter notes patient often does not answer the questions despite his best efforts to obtain the information.       Home Medications Prior to Admission medications   Medication Sig Start Date End Date Taking? Authorizing Provider  omeprazole (PRILOSEC) 40 MG capsule Take 1 capsule (40 mg total) by mouth daily. 05/02/22 06/01/22 Yes Tacy Learn, PA-C      Allergies    Patient has no known allergies.    Review of Systems   Review of Systems Level 5 caveat due to difficult historian and language barrier despite use of interpreter  Physical Exam Updated Vital Signs BP 108/77   Pulse (!) 56   Temp 98.1 F (36.7 C) (Oral)   Resp 16   Ht 5\' 9"  (1.753 m)   Wt 70 kg   SpO2 100%   BMI 22.79 kg/m  Physical Exam Vitals and nursing note reviewed.  Constitutional:      General: He is not in acute distress.    Appearance: He is well-developed. He is not diaphoretic.  HENT:     Head: Normocephalic and atraumatic.  Cardiovascular:     Rate and Rhythm:  Normal rate and regular rhythm.     Heart sounds: Normal heart sounds.  Pulmonary:     Effort: Pulmonary effort is normal.     Breath sounds: Normal breath sounds.  Abdominal:     Palpations: Abdomen is soft.     Tenderness: There is no abdominal tenderness.     Hernia: No hernia is present.  Skin:    General: Skin is warm and dry.     Findings: No erythema or rash.  Neurological:     Mental Status: He is alert and oriented to person, place, and time.  Psychiatric:        Behavior: Behavior normal.     ED Results / Procedures / Treatments   Labs (all labs ordered are listed, but only abnormal results are displayed) Labs Reviewed  URINALYSIS, ROUTINE W REFLEX MICROSCOPIC - Abnormal; Notable for the following components:      Result Value   Hgb urine dipstick SMALL (*)    Leukocytes,Ua TRACE (*)    All other components within normal limits  COMPREHENSIVE METABOLIC PANEL - Abnormal; Notable for the following components:   Potassium 2.8 (*)    All other components within normal limits  LIPASE, BLOOD  CBC  MAGNESIUM  TROPONIN I (HIGH SENSITIVITY)  TROPONIN I (HIGH SENSITIVITY)    EKG EKG Interpretation  Date/Time:  Saturday May 01 2022 18:34:06 EDT Ventricular Rate:  59 PR Interval:  176 QRS Duration: 98 QT Interval:  412 QTC Calculation: 407 R Axis:   81 Text Interpretation: Sinus bradycardia Otherwise normal ECG No previous ECGs available Confirmed by Dione Booze ([id number]) on 05/02/2022 5:41:17 AM  Radiology US Abdomen Limited  Result Date: 05/02/2022 CLINICAL DATA:  Right upper quadrant pain EXAM: ULTRASOUND ABDOMEN LIMITED RIGHT UPPER QUADRANT COMPARISON:  None Available. FINDINGS: Gallbladder: Incompletely distended gallbladder.  No stone or focal tenderness. Common bile duct: Diameter: 5 mm.  Where visualized, no filling defect. Liver: No focal lesion identified. Within normal limits in parenchymal echogenicity. Portal vein is patent on color Doppler imaging  with normal direction of blood flow towards the liver. IMPRESSION: Negative right upper quadrant ultrasound. Electronically Signed   By: Tiburcio Pea M.D.   On: 05/02/2022 09:56   DG Chest 2 View  Result Date: 05/01/2022 CLINICAL DATA:  Chest pain EXAM: CHEST - 2 VIEW COMPARISON:  11/24/2020 FINDINGS: The heart size and mediastinal contours are within normal limits. Both lungs are clear. The visualized skeletal structures are unremarkable. Mild thickening along the right fissure. IMPRESSION: No active cardiopulmonary disease. Electronically Signed   By: Jasmine Pang M.D.   On: 05/01/2022 19:17    Procedures Procedures    Medications Ordered in ED Medications  potassium chloride 10 mEq in 100 mL IVPB (0 mEq Intravenous Stopped 05/02/22 1248)  sodium chloride 0.9 % bolus 250 mL (0 mLs Intravenous Stopped 05/02/22 1248)  potassium chloride SA (KLOR-CON M) CR tablet 40 mEq (40 mEq Oral Given 05/02/22 1145)    ED Course/ Medical Decision Making/ A&P                           Medical Decision Making Amount and/or Complexity of Data Reviewed Labs: ordered. Radiology: ordered.  Risk Prescription drug management.   This patient presents to the ED for concern of epigastric abdominal pain, this involves an extensive number of treatment options, and is a complaint that carries with it a high risk of complications and morbidity.  The differential diagnosis includes but not limited to gastritis, peptic ulcer disease, cholelithiasis, pancreatitis, choledocholithiasis, ACS   Co morbidities that complicate the patient evaluation  Tobacco use, latent TB, chronic gastritis   Additional history obtained:  External records from outside source obtained and reviewed including visit to wound care dated 04/22/2022 where patient is being managed for left leg ulcer.   Lab Tests:  I Ordered, and personally interpreted labs.  The pertinent results include: CBC unremarkable.  Magnesium within normal  limits.  CMP with hypokalemia with potassium of 2.8.  Urinalysis with trace leukocytes, no bacteria seen.  Troponins unchanged at 3 and 3.   Imaging Studies ordered:  I ordered imaging studies including right upper quadrant ultrasound I independently visualized and interpreted imaging which showed no acute abnormality I agree with the radiologist interpretation   Cardiac Monitoring: / EKG:  The patient was maintained on a cardiac monitor.  I personally viewed and interpreted the cardiac monitored which showed an underlying rhythm of: Sinus bradycardia, rate 59  Problem List / ED Course / Critical interventions / Medication management  46 year old male presents with complaint of epigastric pain which radiates to the right upper quadrant, ongoing problem.  History is complicated, patient requires use of an interpreter, the interpreter also struggles to get patient to answer questions at times.  This seems to be  an ongoing problem without acute change.  His vitals are reviewed and are stable.  His labs are reviewed, he is found to have a hypokalemia with potassium 2.8, replaced orally as well as with IV.  Blood counts are stable, cardiac work-up has been unremarkable including troponin x2 and EKG.  Right upper quadrant ultrasound is also found to be unremarkable.  Consider possible peptic ulcer disease.  Patient does smoke tobacco, no report of NSAID use.  He has a history of gastritis.  Patient is treated with omeprazole and advised to recheck with his primary care provider. I ordered medication including KCL, KDUR  for hypokalemia  Reevaluation of the patient after these medicines showed that the patient stayed the same I have reviewed the patients home medicines and have made adjustments as needed   Social Determinants of Health:  Has PCP   Test / Admission - Considered:  Consider CT for further work up of epigastric abdominal pain however with stable labs, felt reasonable for dc with  further work up with PCP, consider GI referral for endoscopy for possible PUD.          Final Clinical Impression(s) / ED Diagnoses Final diagnoses:  Epigastric pain  Hypokalemia    Rx / DC Orders ED Discharge Orders          Ordered    omeprazole (PRILOSEC) 40 MG capsule  Daily        05/02/22 1111              [name], PA-C 05/02/22 1255    Melene Plan, DO 05/02/22 1528

## 2022-05-02 NOTE — ED Notes (Signed)
Patient transported to Ultrasound 

## 2022-05-02 NOTE — ED Notes (Signed)
Pt name called, no response

## 2022-05-17 ENCOUNTER — Encounter (HOSPITAL_BASED_OUTPATIENT_CLINIC_OR_DEPARTMENT_OTHER): Payer: Medicaid Other | Attending: General Surgery | Admitting: General Surgery

## 2022-05-17 DIAGNOSIS — L97328 Non-pressure chronic ulcer of left ankle with other specified severity: Secondary | ICD-10-CM | POA: Insufficient documentation

## 2022-05-17 DIAGNOSIS — S91002A Unspecified open wound, left ankle, initial encounter: Secondary | ICD-10-CM | POA: Diagnosis not present

## 2022-05-17 DIAGNOSIS — I87332 Chronic venous hypertension (idiopathic) with ulcer and inflammation of left lower extremity: Secondary | ICD-10-CM | POA: Diagnosis not present

## 2022-05-18 NOTE — Progress Notes (Signed)
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 use endoform with drawtex and 3 layer compression. I reminded him of the importance of coming to clinic each week so we can adequately manage his wound and adjust therapy as indicated.  He also requested some notes for accommodations at work, including a temporary handicap placard and enforced rest breaks. Both of these were provided. He will follow-up in 1 week. Electronic Signature(s) Signed: 05/17/2022 6:18:58 AM By: [name]  MD FACS Entered By: [name] on 05/17/2022 09:18:58 -------------------------------------------------------------------------------- HxROS Details Patient Name: Date of Service: [name] [name] 05/17/2022 8:30 A M Medical Record Number: [id number] Patient Account Number: [id number] Date of Birth/Sex: Treating RN: 1975/11/08 (46 y.o. M) Primary Care Provider: PA [name], NO Other Clinician: Referring Provider: Treating Provider/Extender: [name] in Treatment: 66 Information Obtained From Patient Gastrointestinal Medical History: Past Medical History Notes: Chronic Gastritis Immunizations Pneumococcal Vaccine: Received Pneumococcal Vaccination: No Rase, [name] ([id number]) [id number] Page 11 of 11 Implantable Devices No devices added Family and Social History Unknown History: Yes; Current every day smoker; Marital Status - Married; Alcohol Use: Rarely; Drug Use: No History; Caffeine Use: Moderate; Financial Concerns: No; Food, Clothing or Shelter Needs: No; Support System Lacking: No; Transportation Concerns: No Electronic Signature(s) Signed: 05/17/2022 9:20:30 AM By: [name] MD FACS Entered By: [name] on 05/17/2022 09:17:08 -------------------------------------------------------------------------------- SuperBill Details Patient Name: Date of Service: [name] [name] 05/17/2022 Medical Record Number: [id number] Patient Account Number: [id number] Date of Birth/Sex: Treating RN: 07-10-1976 (46 y.o. M) Primary Care Provider: PA TIENT, NO Other Clinician: Referring Provider: Treating Provider/Extender: [name] in Treatment: 66 Diagnosis Coding ICD-10 Codes Code Description L97.328 Non-pressure chronic ulcer of left ankle with other specified severity I87.332 Chronic venous hypertension (idiopathic) with ulcer and inflammation of left lower extremity Facility Procedures : CPT4 Code: [id number] Description: [id number] - DEB SUBQ TISSUE 20 SQ CM/< ICD-10 Diagnosis Description L97.328 Non-pressure chronic ulcer of left ankle with other specified severity Modifier: Quantity: 1 Physician Procedures : CPT4 Code Description Modifier [id number] - WC PHYS LEVEL 3 - EST PT 25 ICD-10 Diagnosis Description L97.328 Non-pressure chronic ulcer of left ankle with other specified severity I87.332 Chronic venous hypertension (idiopathic) with ulcer and  inflammation of left lower extremity Quantity: 1 : [id number] - WC PHYS SUBQ TISS 20 SQ CM ICD-10 Diagnosis Description L97.328 Non-pressure chronic ulcer of left ankle with other specified severity Quantity: 1 Electronic Signature(s) Signed: 05/17/2022 9:20:11 AM By: [name] MD FACS Entered By: [name] on 05/17/2022 09:20:11

## 2022-05-18 NOTE — Progress Notes (Signed)
Rozenberg, [name] ([id number]) [id number] Page 1 of 7 Visit Report for 05/17/2022 Arrival Information Details Patient Name: Date of Service: [name] 05/17/2022 8:30 A M Medical Record Number: [id number] Patient Account Number: [id number] Date of Birth/Sex: Treating RN: 1976-05-30 (46 y.o. [name] Primary Care Jadon Ressler: PA [name], [place] Other Clinician: Referring Katira Dumais: Treating Zimere Dunlevy/Extender: [name] in Treatment: 66 Visit Information History Since Last Visit Added or deleted any medications: No Patient Arrived: Ambulatory Any new allergies or adverse reactions: No Arrival Time: 05:20 Had a fall or experienced change in No Transfer Assistance: None activities of daily living that may affect Patient Requires Transmission-Based Precautions: No risk of falls: Patient Has Alerts: No Signs or symptoms of abuse/neglect since last visito No Hospitalized since last visit: No Has Dressing in Place as Prescribed: Yes Has Compression in Place as Prescribed: Yes Pain Present Now: No Electronic Signature(s) Signed: 05/17/2022 9:48:15 PM By: [name] RN Entered By: [name] on 05/17/2022 08:21:15 -------------------------------------------------------------------------------- Encounter Discharge Information Details Patient Name: Date of Service: [name] NA [place] 05/17/2022 8:30 A M Medical Record Number: [id number] Patient Account Number: [id number] Date of Birth/Sex: Treating RN: Aug 22, 1975 (46 y.o. [name] Primary Care Margel Joens: PA [name], NO Other Clinician: Referring Benancio Osmundson: Treating Ival Pacer/Extender: [name] in Treatment: 28 Encounter Discharge Information Items Post Procedure Vitals Discharge Condition: Stable Temperature (F): 97.8 Ambulatory Status: Ambulatory Pulse (bpm): 55 Discharge Destination: Home Respiratory Rate (breaths/min):  16 Transportation: Private Auto Blood Pressure (mmHg): 109/63 Accompanied By: translator Schedule Follow-up Appointment: Yes Clinical Summary of Care: Patient Declined Electronic Signature(s) Signed: 05/17/2022 2:31:58 PM By: [name] Entered By: [name] on 05/17/2022 09:17:22 Willhelm, [name] ([id number]) [id number] Page 2 of 7 -------------------------------------------------------------------------------- Lower Extremity Assessment Details Patient Name: Date of Service: [name] [name] 05/17/2022 8:30 A M Medical Record Number: [id number] Patient Account Number: [id number] Date of Birth/Sex: Treating RN: 02-16-1976 (46 y.o. [name] Primary Care Aydien Majette: PA [name], [place] Other Clinician: Referring Carra Brindley: Treating Madysun Thall/Extender: [name] in Treatment: 66 Edema Assessment Assessed: [Left: No] [Right: No] Edema: [Left: Ye] [Right: s] Calf Left: Right: Point of Measurement: 28 cm From Medial Instep 32 cm Ankle Left: Right: Point of Measurement: 8 cm From Medial Instep 22.8 cm Vascular Assessment Pulses: Dorsalis Pedis Palpable: [Left:Yes] Electronic Signature(s) Signed: 05/17/2022 9:48:15 PM By: [name] RN Entered By: [name] on 05/17/2022 08:28:27 -------------------------------------------------------------------------------- Multi Wound Chart Details Patient Name: Date of Service: [name] NA [place] 05/17/2022 8:30 A M Medical Record Number: [id number] Patient Account Number: [id number] Date of Birth/Sex: Treating RN: 03-Feb-1976 (46 y.o. M) Primary Care Tyrome Donatelli: PA [name], NO Other Clinician: Referring Jaiveer Panas: Treating Joeann Steppe/Extender: [name] in Treatment: 66 Vital Signs Height(in): Pulse(bpm): 55 Weight(lbs): 170 Blood Pressure(mmHg): 109/63 Body Mass Index(BMI): Temperature(F): 97.8 Respiratory Rate(breaths/min):  16 [1:Photos:] [N/A:N/A] Left Ankle N/A N/A Wound Location: Trauma N/A N/A Wounding Event: Trauma, Other N/A N/A Primary Etiology: 10/14/2020 N/A N/A Date Acquired: 49 N/A N/A Weeks of Treatment: Hayner, [name] ([id number]) [id number] Page 3 of 7 Open N/A N/A Wound Status: No N/A N/A Wound Recurrence: 1.3x2x0.3 N/A N/A Measurements L x W x D (cm) 2.042 N/A N/A A (cm) : rea 0.613 N/A N/A Volume (cm) : 96.70% N/A N/A % Reduction in Area: 97.50% N/A N/A % Reduction in Volume: Full Thickness Without Exposed N/A N/A Classification: Support Structures Medium N/A N/A Exudate A mount: Serosanguineous N/A N/A Exudate  Type: red, brown N/A N/A Exudate Color: Flat and Intact N/A N/A Wound Margin: Medium (34-66%) N/A N/A Granulation A mount: Red N/A N/A Granulation Quality: Medium (34-66%) N/A N/A Necrotic A mount: Fat Layer (Subcutaneous Tissue): Yes N/A N/A Exposed Structures: Fascia: No Tendon: No Muscle: No Joint: No Bone: No Small (1-33%) N/A N/A Epithelialization: Debridement - Excisional N/A N/A Debridement: Pre-procedure Verification/Time Out 08:35 N/A N/A Taken: Lidocaine 4% Topical Solution N/A N/A Pain Control: Necrotic/Eschar, Subcutaneous, N/A N/A Tissue Debrided: Slough Skin/Subcutaneous Tissue N/A N/A Level: 2.6 N/A N/A Debridement A (sq cm): rea Curette N/A N/A Instrument: Minimum N/A N/A Bleeding: Pressure N/A N/A Hemostasis Achieved: Debridement Treatment Response: Procedure was tolerated well N/A N/A Post Debridement Measurements L x 1.3x2x0.3 N/A N/A W x D (cm) 0.613 N/A N/A Post Debridement Volume: (cm) Scarring: Yes N/A N/A Periwound Skin Texture: Dry/Scaly: Yes N/A N/A Periwound Skin Moisture: No Abnormalities Noted N/A N/A Periwound Skin Color: No Abnormality N/A N/A Temperature: Debridement N/A N/A Procedures Performed: Treatment Notes Electronic Signature(s) Signed: 05/17/2022  9:15:22 AM By: [name] FACS Entered By: Duanne Guess on 05/17/2022 09:15:22 -------------------------------------------------------------------------------- Multi-Disciplinary Care Plan Details Patient Name: Date of Service: [name] 05/17/2022 8:30 A M Medical Record Number: [id number] Patient Account Number: [id number] Date of Birth/Sex: Treating RN: [birth year] (46 y.o. Valma Cava Primary Care Moyses Pavey: PA Zenovia Jordan, [place] Other Clinician: Referring [place] Gustafson: Treating Khaden Gater/Extender: Horton Marshall in Treatment: 66 Multidisciplinary Care Plan reviewed with physician Active Inactive Venous Leg Ulcer Nursing Diagnoses: Actual venous Insuffiency (use after diagnosis is confirmed) Knowledge deficit related to disease process and management Goals: Bonnin, [name] ([id number]) [id number] Page 4 of 7 Patient will maintain optimal edema control Date Initiated: 02/19/2022 Target Resolution Date: 07/02/2022 Goal Status: Active Interventions: Assess peripheral edema status every visit. Compression as ordered Treatment Activities: Therapeutic compression applied : 02/19/2022 Notes: Wound/Skin Impairment Nursing Diagnoses: Knowledge deficit related to ulceration/compromised skin integrity Goals: Patient/caregiver will verbalize understanding of skin care regimen Date Initiated: 02/05/2021 Target Resolution Date: 07/02/2022 Goal Status: Active Interventions: Assess patient/caregiver ability to obtain necessary supplies Assess patient/caregiver ability to perform ulcer/skin care regimen upon admission and as needed Provide education on ulcer and skin care Treatment Activities: Skin care regimen initiated : 02/05/2021 Topical wound management initiated : 02/05/2021 Notes: 03/31/21: Wound care regimen ongoing, target date extended. 04/21/21: Wound care ongoing, through interpreter patient states he is doing fine  with his dressing changes. Electronic Signature(s) Signed: 05/17/2022 9:48:15 PM By: Tommie Ard RN Entered By: Tommie Ard on 05/17/2022 08:35:22 -------------------------------------------------------------------------------- Pain Assessment Details Patient Name: Date of Service: [name] 05/17/2022 8:30 A M Medical Record Number: [id number] Patient Account Number: [id number] Date of Birth/Sex: Treating RN: 1976-01-05 (46 y.o. Valma Cava Primary Care Binnie Droessler: PA Zenovia Jordan, [place] Other Clinician: Referring Lenette Rau: Treating Kati Riggenbach/Extender: Horton Marshall in Treatment: 66 Active Problems Location of Pain Severity and Description of Pain Patient Has Paino No Site Locations Schult, [name] ([id number]) [id number] Page 5 of 7 Pain Management and Medication Current Pain Management: Electronic Signature(s) Signed: 05/17/2022 9:48:15 PM By: Tommie Ard RN Entered By: Tommie Ard on 05/17/2022 08:21:53 -------------------------------------------------------------------------------- Patient/Caregiver Education Details Patient Name: Date of Service: [name] 10/16/2023andnbsp8:30 A M Medical Record Number: [id number] Patient Account Number: [id number] Date of Birth/Gender: Treating RN: 1975-09-25 (46 y.o. Valma Cava Primary Care Physician: PA Zenovia Jordan, [place] Other Clinician: Referring Physician: Treating Physician/Extender: Horton Marshall in Treatment: 85 Education Assessment  Education Provided To: Patient Education Topics Provided Wound/Skin Impairment: Methods: Explain/Verbal Responses: Reinforcements needed, State content correctly Electronic Signature(s) Signed: 05/17/2022 9:48:15 PM By: Tommie Ard RN Entered By: Tommie Ard on 05/17/2022 08:35:46 -------------------------------------------------------------------------------- Wound Assessment  Details Patient Name: Date of Service: [name] 05/17/2022 8:30 A M Medical Record Number: [id number] Patient Account Number: [id number] Date of Birth/Sex: Treating RN: [birth year] (46 y.o. Valma Cava Primary Care Sherin Murdoch: PA Zenovia Jordan, [place] Other Clinician: Referring Shamon Lobo: Treating Javari Bufkin/Extender: Tollie Pizza Tripodi, [name] ([id number]) [id number] Page 6 of 7 Weeks in Treatment: 66 Wound Status Wound Number: 1 Primary Etiology: Trauma, Other Wound Location: Left Ankle Wound Status: Open Wounding Event: Trauma Date Acquired: 10/14/2020 Weeks Of Treatment: 66 Clustered Wound: No Photos Wound Measurements Length: (cm) 1.3 Width: (cm) 2 Depth: (cm) 0.3 Area: (cm) 2.042 Volume: (cm) 0.613 % Reduction in Area: 96.7% % Reduction in Volume: 97.5% Epithelialization: Small (1-33%) Tunneling: No Undermining: No Wound Description Classification: Full Thickness Without Exposed Support Structures Wound Margin: Flat and Intact Exudate Amount: Medium Exudate Type: Serosanguineous Exudate Color: red, brown Foul Odor After Cleansing: No Slough/Fibrino Yes Wound Bed Granulation Amount: Medium (34-66%) Exposed Structure Granulation Quality: Red Fascia Exposed: No Necrotic Amount: Medium (34-66%) Fat Layer (Subcutaneous Tissue) Exposed: Yes Necrotic Quality: Adherent Slough Tendon Exposed: No Muscle Exposed: No Joint Exposed: No Bone Exposed: No Periwound Skin Texture Texture Color No Abnormalities Noted: No No Abnormalities Noted: Yes Scarring: Yes Temperature / Pain Temperature: No Abnormality Moisture No Abnormalities Noted: No Dry / Scaly: Yes Treatment Notes Wound #1 (Ankle) Wound Laterality: Left Cleanser Soap and Water Discharge Instruction: May shower and wash wound with dial antibacterial soap and water prior to dressing change. Wound Cleanser Discharge Instruction: Cleanse the wound with  wound cleanser prior to applying a clean dressing using gauze sponges, not tissue or cotton balls. Peri-Wound Care Sween Lotion (Moisturizing lotion) Discharge Instruction: Apply moisturizing lotion as directed Topical Primary Dressing Endoform 2x2 in Wigger, [name] ([id number]) [phone].pdf Page 7 of 7 Discharge Instruction: Moisten with saline Secondary Dressing Drawtex 4x4 in Discharge Instruction: Apply over primary dressing as directed. Zetuvit Plus 4x8 in Discharge Instruction: Apply over primary dressing as directed. Secured With Compression Wrap ThreePress (3 layer compression wrap) Discharge Instruction: Apply three layer compression as directed. Compression Stockings Add-Ons Electronic Signature(s) Signed: 05/17/2022 2:31:58 PM By: Samuella Bruin Signed: 05/17/2022 9:48:15 PM By: Tommie Ard RN Entered By: Samuella Bruin on 05/17/2022 08:31:32 -------------------------------------------------------------------------------- Vitals Details Patient Name: Date of Service: [name], [name] 05/17/2022 8:30 A M Medical Record Number: [id number] Patient Account Number: [id number] Date of Birth/Sex: Treating RN: 1976-01-04 (46 y.o. Valma Cava Primary Care Foy Vanduyne: PA Zenovia Jordan, [place] Other Clinician: Referring Reyes Aldaco: Treating Maylee Bare/Extender: Horton Marshall in Treatment: 66 Vital Signs Time Taken: 08:21 Temperature (F): 97.8 Weight (lbs): 170 Pulse (bpm): 55 Respiratory Rate (breaths/min): 16 Blood Pressure (mmHg): 109/63 Reference Range: 80 - 120 mg / dl Electronic Signature(s) Signed: 05/17/2022 9:48:15 PM By: Tommie Ard RN Entered By: Tommie Ard on 05/17/2022 08:21:47

## 2022-05-25 ENCOUNTER — Encounter (HOSPITAL_BASED_OUTPATIENT_CLINIC_OR_DEPARTMENT_OTHER): Payer: Medicaid Other | Admitting: General Surgery

## 2022-05-25 DIAGNOSIS — I87332 Chronic venous hypertension (idiopathic) with ulcer and inflammation of left lower extremity: Secondary | ICD-10-CM | POA: Diagnosis not present

## 2022-05-25 DIAGNOSIS — L97322 Non-pressure chronic ulcer of left ankle with fat layer exposed: Secondary | ICD-10-CM | POA: Diagnosis not present

## 2022-05-25 DIAGNOSIS — L97328 Non-pressure chronic ulcer of left ankle with other specified severity: Secondary | ICD-10-CM | POA: Diagnosis not present

## 2022-05-25 NOTE — Progress Notes (Signed)
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 compression wrap) 1 x Per Week/30 Days Discharge Instructions: Apply three layer  compression as directed. 05/25/2022: The wound surface is improved, with less fibrosis and a more pink color. There is slough on the  surface with some periwound eschar. I used a curette to debride slough, eschar, and nonviable subcutaneous tissue from the wound. We will continue to use endoform, as I am seeing positive results in terms of the tissue surface. Continue 3 layer compression. Follow-up in 1 week. Electronic Signature(s) Signed: 05/25/2022 8:16:26 AM By: [name] MD FACS Entered By: [name] on 05/25/2022 08:16:26 -------------------------------------------------------------------------------- HxROS Details Patient Name: Date of Service: [name] NA [place] 05/25/2022 7:30 A M Medical Record Number: [id number] Patient Account Number: [id number] Date of Birth/Sex: Treating RN: [birth year] (47 y.o. M) Primary Care Provider: PA Darnelle Spangle Other Clinician: Referring Provider: Treating Provider/Extender: [name] in Treatment: 67 Information Obtained From Patient Gastrointestinal Medical History: Past Medical History Notes: Chronic Gastritis [name], [name] ([id number]) [id number] Page 11 of 11 Immunizations Pneumococcal Vaccine: Received Pneumococcal Vaccination: No Implantable Devices No devices added Family and Social History Unknown History: Yes; Current every day smoker; Marital Status - Married; Alcohol Use: Rarely; Drug Use: No History; Caffeine Use: Moderate; Financial Concerns: No; Food, Clothing or Shelter Needs: No; Support System Lacking: No; Transportation Concerns: No Electronic Signature(s) Signed: 05/25/2022 8:23:17 AM By: [name] MD FACS Entered By: [name] on 05/25/2022 08:14:20 -------------------------------------------------------------------------------- SuperBill Details Patient Name: Date of Service: [name] 05/25/2022 Medical Record  Number: [id number] Patient Account Number: [id number] Date of Birth/Sex: Treating RN: [birth year] (46 y.o. 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 Chronic venous hypertension (idiopathic) with ulcer and inflammation of left lower extremity Facility Procedures : CPT4 Code: [id number] Description: [id number] - DEB SUBQ TISSUE 20 SQ CM/< ICD-10 Diagnosis Description L97.328 Non-pressure chronic ulcer of left ankle with other specified severity Modifier: Quantity: 1 Physician Procedures : CPT4 Code Description Modifier [id number] - WC PHYS LEVEL 3 - EST PT 25 ICD-10 Diagnosis Description L97.328 Non-pressure chronic ulcer of left ankle with other specified severity I87.332 Chronic venous hypertension (idiopathic) with ulcer and  inflammation of left lower extremity Quantity: 1 : [id number] - WC PHYS SUBQ TISS 20 SQ CM ICD-10 Diagnosis Description L97.328 Non-pressure chronic ulcer of left ankle with other specified severity Quantity: 1 Electronic Signature(s) Signed: 05/25/2022 8:17:26 AM By: [name] MD FACS Previous Signature: 05/25/2022 8:16:49 AM Version By: [name] MD FACS Entered By: [name] on 05/25/2022 08:17:26

## 2022-05-25 NOTE — Progress Notes (Signed)
[name], [name] ([id number]) [id number] Page 1 of 8 Visit Report for 05/25/2022 Arrival Information Details Patient Name: Date of Service: [name] [place] NIE 05/25/2022 7:30 A M Medical Record Number: [id number] Patient Account Number: [id number] Date of Birth/Sex: Treating RN: Aug 18, 1975 (46 y.o. [name] Primary Care [name]: PA TIENT, NO Other Clinician: Referring [name]: Treating [name]/Extender: [name] in Treatment: 43 Visit Information History Since Last Visit Added or deleted any medications: No Patient Arrived: Ambulatory Any new allergies or adverse reactions: No Arrival Time: 07:53 Had a fall or experienced change in No Accompanied By: interpreter activities of daily living that may affect Transfer Assistance: None risk of falls: Patient Identification Verified: Yes Signs or symptoms of abuse/neglect since last visito No Secondary Verification Process Completed: Yes Hospitalized since last visit: No Patient Requires Transmission-Based Precautions: No Implantable device outside of the clinic excluding No Patient Has Alerts: No cellular tissue based products placed in the center since last visit: Has Dressing in Place as Prescribed: Yes Has Compression in Place as Prescribed: Yes Pain Present Now: No Electronic Signature(s) Signed: 05/25/2022 4:40:39 PM By: [name] Pulling RN, BSN Entered By: [name] on 05/25/2022 07:54:18 -------------------------------------------------------------------------------- Compression Therapy Details Patient Name: Date of Service: [name] 05/25/2022 7:30 A M Medical Record Number: [id number] Patient Account Number: [id number] Date of Birth/Sex: Treating RN: 1976-07-19 (46 y.o. [name] Primary Care [name]: PA [name], [place] Other Clinician: Referring [name]: Treating [name]/Extender: [name] in  Treatment: 29 Compression Therapy Performed for Wound Assessment: Wound #1 Left Ankle Performed By: Clinician [name] Pulling, RN Compression Type: Three Layer Post Procedure Diagnosis Same as Pre-procedure Electronic Signature(s) Signed: 05/25/2022 4:40:39 PM By: [name] Pulling RN, BSN Entered By: [name] on 05/25/2022 11:03:17 [name], [name] ([id number]) [id number] Page 2 of 8 -------------------------------------------------------------------------------- Encounter Discharge Information Details Patient Name: Date of Service: [name] 05/25/2022 7:30 A M Medical Record Number: [id number] Patient Account Number: [id number] Date of Birth/Sex: Treating RN: 11-04-1975 (46 y.o. [name] Primary Care [name]: PA [name], [place] Other Clinician: Referring [name]: Treating [name]/Extender: [name] in Treatment: 53 Encounter Discharge Information Items Post Procedure Vitals Discharge Condition: Stable Temperature (F): 97.7 Ambulatory Status: Ambulatory Pulse (bpm): 76 Discharge Destination: Home Respiratory Rate (breaths/min): 16 Transportation: Private Auto Blood Pressure (mmHg): 111/67 Accompanied By: interpreter Schedule Follow-up Appointment: Yes Clinical Summary of Care: Patient Declined Electronic Signature(s) Signed: 05/25/2022 4:40:39 PM By: [name] Pulling RN, BSN Entered By: [name] on 05/25/2022 08:09:13 -------------------------------------------------------------------------------- Lower Extremity Assessment Details Patient Name: Date of Service: [name] 05/25/2022 7:30 A M Medical Record Number: [id number] Patient Account Number: [id number] Date of Birth/Sex: Treating RN: [birth year] (46 y.o. [name] Primary Care Zaquan Duffner: PA [name], NO Other Clinician: Referring [name]: Treating [name]/Extender: [name] in Treatment:  67 Edema Assessment Assessed: [Left: No] [Right: No] Edema: [Left: Ye] [Right: s] Calf Left: Right: Point of Measurement: 28 cm From Medial Instep 22 cm Ankle Left: Right: Point of Measurement: 8 cm From Medial Instep 32.5 cm Vascular Assessment Pulses: Dorsalis Pedis Palpable: [Left:Yes] Electronic Signature(s) Signed: 05/25/2022 4:40:39 PM By: [name] Pulling RN, BSN Entered By: [name] on 05/25/2022 07:55:35 Multi Wound Chart Details -------------------------------------------------------------------------------- [name] ([id number]) [id number] Page 3 of 8 Patient Name: Date of Service: [name] 05/25/2022 7:30 A M Medical Record Number: [id number] Patient Account Number: [id number] Date of Birth/Sex: Treating RN: [birth year] (46  y.o. M) Primary Care [name]: PA [name], NO Other Clinician: Referring [name]: Treating [name]/Extender: [name] in Treatment: 67 Vital Signs Height(in): Pulse(bpm): 26 Weight(lbs): 170 Blood Pressure(mmHg): 111/67 Body Mass Index(BMI): Temperature(F): 97.7 Respiratory Rate(breaths/min): 16 Wound Assessments Wound Number: 1 N/A N/A Photos: N/A N/A Left Ankle N/A N/A Wound Location: Trauma N/A N/A Wounding Event: Trauma, Other N/A N/A Primary Etiology: 10/14/2020 N/A N/A Date Acquired: 47 N/A N/A Weeks of Treatment: Open N/A N/A Wound Status: No N/A N/A Wound Recurrence: 1.1x1.9x0.3 N/A N/A Measurements L x W x D (cm) 1.641 N/A N/A A (cm) : rea 0.492 N/A N/A Volume (cm) : 97.30% N/A N/A % Reduction in A rea: 98.00% N/A N/A % Reduction in Volume: Full Thickness Without Exposed N/A N/A Classification: Support Structures Medium N/A N/A Exudate A mount: Serosanguineous N/A N/A Exudate Type: red, brown N/A N/A Exudate Color: Flat and Intact N/A N/A Wound Margin: Small (1-33%) N/A N/A Granulation A mount: Red N/A  N/A Granulation Quality: Large (67-100%) N/A N/A Necrotic A mount: Fat Layer (Subcutaneous Tissue): Yes N/A N/A Exposed Structures: Fascia: No Tendon: No Muscle: No Joint: No Bone: No Large (67-100%) N/A N/A Epithelialization: Debridement - Selective/Open Wound N/A N/A Debridement: Pre-procedure Verification/Time Out 08:00 N/A N/A Taken: Lidocaine 4% Topical Solution N/A N/A Pain Control: Necrotic/Eschar, Slough N/A N/A Tissue Debrided: Non-Viable Tissue N/A N/A Level: 2.09 N/A N/A Debridement A (sq cm): rea Curette N/A N/A Instrument: Minimum N/A N/A Bleeding: Pressure N/A N/A Hemostasis A chieved: 0 N/A N/A Procedural Pain: 0 N/A N/A Post Procedural Pain: Procedure was tolerated well N/A N/A Debridement Treatment Response: 1.1x1.9x0.3 N/A N/A Post Debridement Measurements L x W x D (cm) 0.492 N/A N/A Post Debridement Volume: (cm) Scarring: Yes N/A N/A Periwound Skin Texture: Dry/Scaly: Yes N/A N/A Periwound Skin Moisture: No Abnormalities Noted N/A N/A Periwound Skin Color: Cool/Cold N/A N/A Temperature: Debridement N/A N/A Procedures Performed: Treatment Notes Wound #1 (Ankle) Wound Laterality: Left Cleanser Vital, [name] ([id number]) [id number] Page 4 of 8 Soap and Water Discharge Instruction: May shower and wash wound with dial antibacterial soap and water prior to dressing change. Wound Cleanser Discharge Instruction: Cleanse the wound with wound cleanser prior to applying a clean dressing using gauze sponges, not tissue or cotton balls. Peri-Wound Care Sween Lotion (Moisturizing lotion) Discharge Instruction: Apply moisturizing lotion as directed Topical Primary Dressing Endoform 2x2 in Discharge Instruction: Moisten with saline Secondary Dressing Drawtex 4x4 in Discharge Instruction: Apply over primary dressing as directed. Zetuvit Plus 4x8 in Discharge Instruction: Apply over primary dressing as  directed. Secured With Compression Wrap ThreePress (3 layer compression wrap) Discharge Instruction: Apply three layer compression as directed. Compression Stockings Add-Ons Electronic Signature(s) Signed: 05/25/2022 8:13:09 AM By: [name] MD FACS Entered By: [name] on 05/25/2022 08:13:09 -------------------------------------------------------------------------------- Multi-Disciplinary Care Plan Details Patient Name: Date of Service: [name] [name] 05/25/2022 7:30 A M Medical Record Number: [id number] Patient Account Number: [id number] Date of Birth/Sex: Treating RN: 1976-03-12 (46 y.o. Mare Ferrari Primary Care Alanee Ting: PA [name], [place] Other Clinician: Referring Deshannon Seide: Treating Sharonann Malbrough/Extender: [name] in Treatment: 47 Multidisciplinary Care Plan reviewed with physician Active Inactive Venous Leg Ulcer Nursing Diagnoses: Actual venous Insuffiency (use after diagnosis is confirmed) Knowledge deficit related to disease process and management Goals: Patient will maintain optimal edema control Date Initiated: 02/19/2022 Target Resolution Date: 07/02/2022 Goal Status: Active Interventions: Assess peripheral edema status every visit. Compression as ordered Treatment Activities: Therapeutic compression applied : 02/19/2022 Brister, [name] (  [id number]) [id number] Page 5 of 8 Notes: Wound/Skin Impairment Nursing Diagnoses: Knowledge deficit related to ulceration/compromised skin integrity Goals: Patient/caregiver will verbalize understanding of skin care regimen Date Initiated: 02/05/2021 Target Resolution Date: 07/02/2022 Goal Status: Active Interventions: Assess patient/caregiver ability to obtain necessary supplies Assess patient/caregiver ability to perform ulcer/skin care regimen upon admission and as needed Provide education on ulcer and skin care Treatment Activities: Skin care  regimen initiated : 02/05/2021 Topical wound management initiated : 02/05/2021 Notes: 03/31/21: Wound care regimen ongoing, target date extended. 04/21/21: Wound care ongoing, through interpreter patient states he is doing fine with his dressing changes. Electronic Signature(s) Signed: 05/25/2022 4:40:39 PM By: [name] Pulling RN, BSN Entered By: [name] on 05/25/2022 07:56:02 -------------------------------------------------------------------------------- Pain Assessment Details Patient Name: Date of Service: [name] 05/25/2022 7:30 A M Medical Record Number: [id number] Patient Account Number: [id number] Date of Birth/Sex: Treating RN: 1976-02-21 (46 y.o. [name] Primary Care Regis Wiland: PA [name], [place] Other Clinician: Referring Shloima Clinch: Treating Ashelynn Marks/Extender: [name] in Treatment: 74 Active Problems Location of Pain Severity and Description of Pain Patient Has Paino No Site Locations Pain Management and Medication Current Pain Management: Electronic Signature(s) Daigler, [name] ([id number]) [id number].pdf Page 6 of 8 Signed: 05/25/2022 4:40:39 PM By: [name] Pulling RN, BSN Entered By: [name] on 05/25/2022 07:54:51 -------------------------------------------------------------------------------- Patient/Caregiver Education Details Patient Name: Date of Service: [name] 10/24/2023andnbsp7:30 A M Medical Record Number: [id number] Patient Account Number: [id number] Date of Birth/Gender: Treating RN: 1976-05-23 (46 y.o. [name] Primary Care Physician: PA [name], [place] Other Clinician: Referring Physician: Treating Physician/Extender: [name] in Treatment: 70 Education Assessment Education Provided To: Patient Education Topics Provided Wound/Skin Impairment: Methods: Explain/Verbal Responses: State content correctly [employer]) Signed: 05/25/2022 4:40:39 PM By: [name] Pulling RN, BSN Entered By: [name] on 05/25/2022 08:07:54 -------------------------------------------------------------------------------- Wound Assessment Details Patient Name: Date of Service: [name] 05/25/2022 7:30 A M Medical Record Number: [id number] Patient Account Number: [id number] Date of Birth/Sex: Treating RN: Nov 03, 1975 (46 y.o. [name] Primary Care Jermar Colter: PA [name], [place] Other Clinician: Referring Otis Burress: Treating Oletta Buehring/Extender: [name] in Treatment: 52 Wound Status Wound Number: 1 Primary Etiology: Trauma, Other Wound Location: Left Ankle Wound Status: Open Wounding Event: Trauma Date Acquired: 10/14/2020 Weeks Of Treatment: 67 Clustered Wound: No Photos Klimaszewski, [name] ([id number]) [id number] Page 7 of 8 Wound Measurements Length: (cm) 1.1 Width: (cm) 1.9 Depth: (cm) 0.3 Area: (cm) 1.641 Volume: (cm) 0.492 % Reduction in Area: 97.3% % Reduction in Volume: 98% Epithelialization: Large (67-100%) Tunneling: No Undermining: No Wound Description Classification: Full Thickness Without Exposed Support Structures Wound Margin: Flat and Intact Exudate Amount: Medium Exudate Type: Serosanguineous Exudate Color: red, brown Foul Odor After Cleansing: No Slough/Fibrino Yes Wound Bed Granulation Amount: Small (1-33%) Exposed Structure Granulation Quality: Red Fascia Exposed: No Necrotic Amount: Large (67-100%) Fat Layer (Subcutaneous Tissue) Exposed: Yes Necrotic Quality: Adherent Slough Tendon Exposed: No Muscle Exposed: No Joint Exposed: No Bone Exposed: No Periwound Skin Texture Texture Color No Abnormalities Noted: No No Abnormalities Noted: Yes Scarring: Yes Temperature / Pain Temperature: Cool/Cold Moisture No Abnormalities Noted: No Dry / Scaly: Yes Treatment Notes Wound #1 (Ankle) Wound  Laterality: Left Cleanser Soap and Water Discharge Instruction: May shower and wash wound with dial antibacterial soap and water prior to dressing change. Wound Cleanser Discharge Instruction: Cleanse the wound with wound cleanser prior to applying a clean dressing using gauze sponges, not  tissue or cotton balls. Peri-Wound Care Sween Lotion (Moisturizing lotion) Discharge Instruction: Apply moisturizing lotion as directed Topical Primary Dressing Endoform 2x2 in Discharge Instruction: Moisten with saline Secondary Dressing Drawtex 4x4 in Discharge Instruction: Apply over primary dressing as directed. Zetuvit Plus 4x8 in Discharge Instruction: Apply over primary dressing as directed. Secured With Compression Wrap ThreePress (3 layer compression wrap) Discharge Instruction: Apply three layer compression as directed. Compression Stockings Add-Ons Electronic Signature(s) Signed: 05/25/2022 4:40:39 PM By: [name] Pulling RN, BSN Entered By: [name] on 05/25/2022 07:49:56 Allred, [name] ([id number]) [id number] Page 8 of 8 -------------------------------------------------------------------------------- Vitals Details Patient Name: Date of Service: [name] 05/25/2022 7:30 A M Medical Record Number: [id number] Patient Account Number: [id number] Date of Birth/Sex: Treating RN: October 08, 1975 (46 y.o. [name] Primary Care Ankith Edmonston: PA TIENT, NO Other Clinician: Referring Ryin Ambrosius: Treating Liahm Grivas/Extender: [name] in Treatment: 80 Vital Signs Time Taken: 07:51 Temperature (F): 97.7 Weight (lbs): 170 Pulse (bpm): 76 Respiratory Rate (breaths/min): 16 Blood Pressure (mmHg): 111/67 Reference Range: 80 - 120 mg / dl Electronic Signature(s) Signed: 05/25/2022 4:40:39 PM By: [name] Pulling RN, BSN Entered By: [name] on 05/25/2022 07:54:45

## 2022-06-01 ENCOUNTER — Encounter (HOSPITAL_BASED_OUTPATIENT_CLINIC_OR_DEPARTMENT_OTHER): Payer: Medicaid Other | Admitting: General Surgery

## 2022-06-01 DIAGNOSIS — L97328 Non-pressure chronic ulcer of left ankle with other specified severity: Secondary | ICD-10-CM | POA: Diagnosis not present

## 2022-06-01 DIAGNOSIS — L97322 Non-pressure chronic ulcer of left ankle with fat layer exposed: Secondary | ICD-10-CM | POA: Diagnosis not present

## 2022-06-01 DIAGNOSIS — I87332 Chronic venous hypertension (idiopathic) with ulcer and inflammation of left lower extremity: Secondary | ICD-10-CM | POA: Diagnosis not present

## 2022-06-02 DIAGNOSIS — Z419 Encounter for procedure for purposes other than remedying health state, unspecified: Secondary | ICD-10-CM | POA: Diagnosis not present

## 2022-06-02 NOTE — Progress Notes (Signed)
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 gauze sponges, not tissue or cotton balls. Peri-Wound Care: Sween Lotion (Moisturizing lotion) 1 x Per Week/30 Days Discharge Instructions: Apply moisturizing lotion as directed Prim Dressing: Endoform 2x2 in 1 x Per  Week/30 Days ary Discharge Instructions: Moisten with saline Secondary Dressing: Drawtex 4x4 in 1 x Per Week/30 Days  Discharge Instructions: Apply over primary dressing as directed. Secondary Dressing: Zetuvit Plus 4x8 in 1 x Per Week/30 Days Discharge Instructions: Apply over primary dressing as directed. Com pression Wrap: ThreePress (3 layer compression wrap) 1 x Per Week/30 Days Discharge Instructions: Apply three layer compression as directed. 06/01/2022: The wound is a little bit smaller again today and the surface continues to improve. Still with slough buildup, but no concern for infection. I used a curette to debride the slough from his wound. We will continue endoform and 3 layer compression. He also requested a note for work to try and move to a warmer part of his facility. I do think that good blood flow to the wound could potentially be compromised by working in a very cold environment, so I did provide the requested note. He will follow-up in 1 week's time. Electronic Signature(s) Signed: 06/01/2022 12:27:23 PM By: [name] MD FACS Entered By: [name] on 06/01/2022 12:27:23 -------------------------------------------------------------------------------- HxROS Details Patient Name: Date of Service: [name] 06/01/2022 10:30 A M Medical Record Number: [id number] Patient Account Number: [id number] Date of Birth/Sex: Treating RN: 05/22/1976 (46 y.o. M) Primary Care Provider: PA Darnelle Spangle Other Clinician: Referring Provider: Treating Provider/Extender: [name] in Treatment: 68 Information Obtained From Patient Gastrointestinal Medical History: Past Medical History Notes: Chronic Gastritis Huneke, [name] ([id number]) [id number] Page 11 of 11 Immunizations Pneumococcal Vaccine: Received Pneumococcal Vaccination: No Implantable Devices No devices added Family and Social  History Unknown History: Yes; Current every day smoker; Marital Status - Married; Alcohol Use: Rarely; Drug Use: No History; Caffeine Use: Moderate; Financial Concerns: No; Food, Clothing or Shelter Needs: No; Support System Lacking: No; Transportation Concerns: No Electronic Signature(s) Signed: 06/01/2022 12:34:01 PM By: [name] MD FACS Entered By: [name] on 06/01/2022 12:25:49 -------------------------------------------------------------------------------- SuperBill Details Patient Name: Date of Service: [name] 06/01/2022 Medical Record Number: [id number] Patient Account Number: [id number] Date of Birth/Sex: Treating RN: Aug 17, 1975 (46 y.o. M) Primary Care Provider: PA TIENT, NO Other Clinician: Referring Provider: Treating Provider/Extender: [name] in Treatment: 68 Diagnosis Coding ICD-10 Codes Code Description L97.328 Non-pressure chronic ulcer of left ankle with other specified severity I87.332 Chronic venous hypertension (idiopathic) with ulcer and inflammation of left lower extremity Facility Procedures : CPT4 Code: [id number] Description: [id number] - DEBRIDE WOUND 1ST 20 SQ CM OR < ICD-10 Diagnosis Description L97.328 Non-pressure chronic ulcer of left ankle with other specified severity Modifier: Quantity: 1 Physician Procedures : CPT4 Code Description Modifier [id number] - WC PHYS LEVEL 3 - EST PT 25 ICD-10 Diagnosis Description L97.328 Non-pressure chronic ulcer of left ankle with other specified severity I87.332 Chronic venous hypertension (idiopathic) with ulcer and  inflammation of left lower extremity Quantity: 1 : [id number] - WC PHYS DEBR WO ANESTH 20 SQ CM ICD-10 Diagnosis Description L97.328 Non-pressure chronic ulcer of left ankle with other specified severity Quantity: 1 Electronic Signature(s) Signed: 06/01/2022 12:27:49 PM By: [name] MD FACS Entered By: [name] on 06/01/2022  12:27:49

## 2022-06-02 NOTE — Progress Notes (Signed)
Truszkowski, [name] ([id number]) [id number] Page 1 of 7 Visit Report for 06/01/2022 Arrival Information Details Patient Name: Date of Service: [name] [place] NIE 06/01/2022 10:30 A M Medical Record Number: [id number] Patient Account Number: [id number] Date of Birth/Sex: Treating RN: [birth year] (46 y.o. M) Primary Care Taysen Bushart: PA Zenovia Jordan, NO Other Clinician: Referring Britt Petroni: Treating Jomel Whittlesey/Extender: Horton Marshall in Treatment: 68 Visit Information History Since Last Visit All ordered tests and consults were completed: No Patient Arrived: Ambulatory Added or deleted any medications: No Arrival Time: 10:36 Any new allergies or adverse reactions: No Transfer Assistance: None Had a fall or experienced change in No Patient Identification Verified: Yes activities of daily living that may affect Secondary Verification Process Completed: Yes risk of falls: Patient Requires Transmission-Based Precautions: No Signs or symptoms of abuse/neglect since last visito No Patient Has Alerts: No Hospitalized since last visit: No Implantable device outside of the clinic excluding No cellular tissue based products placed in the center since last visit: Pain Present Now: No Electronic Signature(s) Signed: 06/01/2022 11:58:41 AM By: Dayton Scrape Entered By: Dayton Scrape on 06/01/2022 10:38:25 -------------------------------------------------------------------------------- Compression Therapy Details Patient Name: Date of Service: [name] 06/01/2022 10:30 A M Medical Record Number: [id number] Patient Account Number: [id number] Date of Birth/Sex: Treating RN: 11-25-1975 (46 y.o. [name] Primary Care Calaya Gildner: PA Zenovia Jordan, [place] Other Clinician: Referring Brendi Mccarroll: Treating Darril Patriarca/Extender: Horton Marshall in Treatment: 68 Compression Therapy Performed for Wound Assessment: Wound #1 Left  Ankle Performed By: Clinician Zenaida Deed, RN Compression Type: Three Layer Post Procedure Diagnosis Same as Pre-procedure Electronic Signature(s) Signed: 06/01/2022 5:36:35 PM By: [name], BSN Entered By: Zenaida Deed on 06/01/2022 11:25:02 Barreras, [name] ([id number]) [id number] Page 2 of 7 -------------------------------------------------------------------------------- Encounter Discharge Information Details Patient Name: Date of Service: [name] [place] NIE 06/01/2022 10:30 A M Medical Record Number: [id number] Patient Account Number: [id number] Date of Birth/Sex: Treating RN: [birth year] (46 y.o. [name] Primary Care Janaya Broy: PA Zenovia Jordan, [place] Other Clinician: Referring Caldonia Leap: Treating Brentlee Delage/Extender: Horton Marshall in Treatment: 53 Encounter Discharge Information Items Post Procedure Vitals Discharge Condition: Stable Temperature (F): 98.3 Ambulatory Status: Ambulatory Pulse (bpm): 67 Discharge Destination: Home Respiratory Rate (breaths/min): 18 Transportation: Private Auto Blood Pressure (mmHg): 116/68 Accompanied By: Interpreter Schedule Follow-up Appointment: Yes Clinical Summary of Care: Patient Declined Electronic Signature(s) Signed: 06/01/2022 5:36:35 PM By: [name], BSN Entered By: Zenaida Deed on 06/01/2022 13:00:46 -------------------------------------------------------------------------------- Lower Extremity Assessment Details Patient Name: Date of Service: [name] 06/01/2022 10:30 A M Medical Record Number: [id number] Patient Account Number: [id number] Date of Birth/Sex: Treating RN: 16-Nov-1975 (46 y.o. [name] Primary Care Autumm Hattery: PA Zenovia Jordan, NO Other Clinician: Referring Cathan Gearin: Treating Dawanda Mapel/Extender: Horton Marshall in Treatment: 68 Edema Assessment Assessed: [Left: No] [Right: No] Edema: [Left:  Ye] [Right: s] Calf Left: Right: Point of Measurement: 28 cm From Medial Instep 30.8 cm Ankle Left: Right: Point of Measurement: 8 cm From Medial Instep 22.1 cm Vascular Assessment Pulses: Dorsalis Pedis Palpable: [Left:Yes] Electronic Signature(s) Signed: 06/01/2022 4:24:58 PM By: Samuella Bruin Entered By: Samuella Bruin on 06/01/2022 11:06:52 Multi Wound Chart Details -------------------------------------------------------------------------------- [name] ([id number]) [id number] Page 3 of 7 Patient Name: Date of Service: [name] [place] NIE 06/01/2022 10:30 A M Medical Record Number: [id number] Patient Account Number: [id number] Date of Birth/Sex: Treating RN: 03/12/1976 (47 y.o. M) Primary Care Chipper Koudelka: PA Zenovia Jordan, NO Other Clinician: Referring Hattie Pine: Treating Avianna Moynahan/Extender: Lady Gary,  Astrid Drafts, Marolyn Hammock in Treatment: 68 Vital Signs Height(in): Pulse(bpm): 57 Weight(lbs): 170 Blood Pressure(mmHg): 112/68 Body Mass Index(BMI): Temperature(F): 98.3 Respiratory Rate(breaths/min): 20 Wound Assessments Wound Number: 1 N/A N/A Photos: N/A N/A Left Ankle N/A N/A Wound Location: Trauma N/A N/A Wounding Event: Trauma, Other N/A N/A Primary Etiology: 10/14/2020 N/A N/A Date Acquired: 85 N/A N/A Weeks of Treatment: Open N/A N/A Wound Status: No N/A N/A Wound Recurrence: 0.9x1.8x0.1 N/A N/A Measurements L x W x D (cm) 1.272 N/A N/A A (cm) : rea 0.127 N/A N/A Volume (cm) : 97.90% N/A N/A % Reduction in A rea: 99.50% N/A N/A % Reduction in Volume: Full Thickness Without Exposed N/A N/A Classification: Support Structures Medium N/A N/A Exudate A mount: Serosanguineous N/A N/A Exudate Type: red, brown N/A N/A Exudate Color: Flat and Intact N/A N/A Wound Margin: Small (1-33%) N/A N/A Granulation A mount: Red N/A N/A Granulation Quality: Large (67-100%) N/A N/A Necrotic A mount: Fat Layer  (Subcutaneous Tissue): Yes N/A N/A Exposed Structures: Fascia: No Tendon: No Muscle: No Joint: No Bone: No Large (67-100%) N/A N/A Epithelialization: Debridement - Selective/Open Wound N/A N/A Debridement: Pre-procedure Verification/Time Out 11:20 N/A N/A Taken: Lidocaine 4% Topical Solution N/A N/A Pain Control: Necrotic/Eschar, Slough N/A N/A Tissue Debrided: Non-Viable Tissue N/A N/A Level: 1.62 N/A N/A Debridement A (sq cm): rea Curette N/A N/A Instrument: Minimum N/A N/A Bleeding: Pressure N/A N/A Hemostasis A chieved: 0 N/A N/A Procedural Pain: 0 N/A N/A Post Procedural Pain: Procedure was tolerated well N/A N/A Debridement Treatment Response: 0.9x1.8x0.1 N/A N/A Post Debridement Measurements L x W x D (cm) 0.127 N/A N/A Post Debridement Volume: (cm) Scarring: Yes N/A N/A Periwound Skin Texture: Dry/Scaly: Yes N/A N/A Periwound Skin Moisture: No Abnormalities Noted N/A N/A Periwound Skin Color: Cool/Cold N/A N/A Temperature: Compression Therapy N/A N/A Procedures Performed: Debridement Treatment Notes Electronic Signature(s) Perrone, [name] ([id number]) [id number] Page 4 of 7 Signed: 06/01/2022 12:24:35 PM By: [name] FACS Entered By: Duanne Guess on 06/01/2022 12:24:35 -------------------------------------------------------------------------------- Multi-Disciplinary Care Plan Details Patient Name: Date of Service: [name] 06/01/2022 10:30 A M Medical Record Number: [id number] Patient Account Number: [id number] Date of Birth/Sex: Treating RN: 1975/11/09 (46 y.o. [name] Primary Care Venia Riveron: PA Zenovia Jordan, NO Other Clinician: Referring Mame Twombly: Treating Shan Valdes/Extender: Horton Marshall in Treatment: 68 Multidisciplinary Care Plan reviewed with physician Active Inactive Venous Leg Ulcer Nursing Diagnoses: Actual venous Insuffiency (use after  diagnosis is confirmed) Knowledge deficit related to disease process and management Goals: Patient will maintain optimal edema control Date Initiated: 02/19/2022 Target Resolution Date: 07/02/2022 Goal Status: Active Interventions: Assess peripheral edema status every visit. Compression as ordered Treatment Activities: Therapeutic compression applied : 02/19/2022 Notes: Wound/Skin Impairment Nursing Diagnoses: Knowledge deficit related to ulceration/compromised skin integrity Goals: Patient/caregiver will verbalize understanding of skin care regimen Date Initiated: 02/05/2021 Target Resolution Date: 07/02/2022 Goal Status: Active Interventions: Assess patient/caregiver ability to obtain necessary supplies Assess patient/caregiver ability to perform ulcer/skin care regimen upon admission and as needed Provide education on ulcer and skin care Treatment Activities: Skin care regimen initiated : 02/05/2021 Topical wound management initiated : 02/05/2021 Notes: 03/31/21: Wound care regimen ongoing, target date extended. 04/21/21: Wound care ongoing, through interpreter patient states he is doing fine with his dressing changes. Electronic Signature(s) Signed: 06/01/2022 4:24:58 PM By: Samuella Bruin Entered By: Samuella Bruin on 06/01/2022 11:07:08 Mihelich, [name] ([id number]) [id number] Page 5 of 7 -------------------------------------------------------------------------------- Pain Assessment Details Patient Name: Date of Service: [name], [name]  NIE 06/01/2022 10:30 A M Medical Record Number: [id number] Patient Account Number: [id number] Date of Birth/Sex: Treating RN: [birth year] (46 y.o. M) Primary Care Scotlynn Noyes: PA Zenovia Jordan, NO Other Clinician: Referring Kessler Solly: Treating Marlo Goodrich/Extender: Horton Marshall in Treatment: 68 Active Problems Location of Pain Severity and Description of Pain Patient Has Paino No Site  Locations Pain Management and Medication Current Pain Management: Electronic Signature(s) Signed: 06/01/2022 11:58:41 AM By: Dayton Scrape Entered By: Dayton Scrape on 06/01/2022 10:38:56 -------------------------------------------------------------------------------- Patient/Caregiver Education Details Patient Name: Date of Service: [name] 10/31/2023andnbsp10:30 A M Medical Record Number: [id number] Patient Account Number: [id number] Date of Birth/Gender: Treating RN: 1976-04-24 (46 y.o. [name] Primary Care Physician: PA Zenovia Jordan, [place] Other Clinician: Referring Physician: Treating Physician/Extender: Horton Marshall in Treatment: 5 Education Assessment Education Provided To: Patient Education Topics Provided Wound/Skin Impairment: Methods: Explain/Verbal Responses: Reinforcements needed, State content correctly Electronic Signature(s) Karas, [name] ([id number]) [phone].pdf Page 6 of 7 Signed: 06/01/2022 4:24:58 PM By: Gelene Mink By: Samuella Bruin on 06/01/2022 11:08:23 -------------------------------------------------------------------------------- Wound Assessment Details Patient Name: Date of Service: [name] 06/01/2022 10:30 A M Medical Record Number: [id number] Patient Account Number: [id number] Date of Birth/Sex: Treating RN: [birth year] (46 y.o. M) Primary Care Dorleen Kissel: PA TIENT, NO Other Clinician: Referring Rosalene Wardrop: Treating Jenavie Stanczak/Extender: Horton Marshall in Treatment: 68 Wound Status Wound Number: 1 Primary Etiology: Trauma, Other Wound Location: Left Ankle Wound Status: Open Wounding Event: Trauma Date Acquired: 10/14/2020 Weeks Of Treatment: 68 Clustered Wound: No Photos Wound Measurements Length: (cm) 0.9 Width: (cm) 1.8 Depth: (cm) 0.1 Area: (cm) 1.272 Volume: (cm) 0.127 % Reduction in Area: 97.9% % Reduction in  Volume: 99.5% Epithelialization: Large (67-100%) Wound Description Classification: Full Thickness Without Exposed Suppor Wound Margin: Flat and Intact Exudate Amount: Medium Exudate Type: Serosanguineous Exudate Color: red, brown t Structures Foul Odor After Cleansing: No Slough/Fibrino Yes Wound Bed Granulation Amount: Small (1-33%) Exposed Structure Granulation Quality: Red Fascia Exposed: No Necrotic Amount: Large (67-100%) Fat Layer (Subcutaneous Tissue) Exposed: Yes Necrotic Quality: Adherent Slough Tendon Exposed: No Muscle Exposed: No Joint Exposed: No Bone Exposed: No Periwound Skin Texture Texture Color No Abnormalities Noted: No No Abnormalities Noted: Yes Scarring: Yes Temperature / Pain Temperature: Cool/Cold Moisture No Abnormalities Noted: No Dry / Scaly: Yes Klutts, [name] ([id number]) [id number] Page 7 of 7 Treatment Notes Wound #1 (Ankle) Wound Laterality: Left Cleanser Soap and Water Discharge Instruction: May shower and wash wound with dial antibacterial soap and water prior to dressing change. Wound Cleanser Discharge Instruction: Cleanse the wound with wound cleanser prior to applying a clean dressing using gauze sponges, not tissue or cotton balls. Peri-Wound Care Sween Lotion (Moisturizing lotion) Discharge Instruction: Apply moisturizing lotion as directed Topical Primary Dressing Endoform 2x2 in Discharge Instruction: Moisten with saline Secondary Dressing Drawtex 4x4 in Discharge Instruction: Apply over primary dressing as directed. Zetuvit Plus 4x8 in Discharge Instruction: Apply over primary dressing as directed. Secured With Compression Wrap ThreePress (3 layer compression wrap) Discharge Instruction: Apply three layer compression as directed. Compression Stockings Add-Ons Electronic Signature(s) Signed: 06/01/2022 11:58:41 AM By: Dayton Scrape Entered By: Dayton Scrape on 06/01/2022  10:48:29 -------------------------------------------------------------------------------- Vitals Details Patient Name: Date of Service: [name] 06/01/2022 10:30 A M Medical Record Number: [id number] Patient Account Number: [id number] Date of Birth/Sex: Treating RN: 11-03-1975 (46 y.o. M) Primary Care Juanjesus Pepperman: PA Zenovia Jordan, NO Other Clinician: Referring Jonette Wassel: Treating Brieonna Crutcher/Extender: Horton Marshall in Treatment: 68 Vital  Signs Time Taken: 10:38 Temperature (F): 98.3 Weight (lbs): 170 Pulse (bpm): 57 Respiratory Rate (breaths/min): 20 Blood Pressure (mmHg): 112/68 Reference Range: 80 - 120 mg / dl Electronic Signature(s) Signed: 06/01/2022 11:58:41 AM By: Worthy Rancher Entered By: Worthy Rancher on 06/01/2022 10:38:48

## 2022-06-08 ENCOUNTER — Encounter (HOSPITAL_BASED_OUTPATIENT_CLINIC_OR_DEPARTMENT_OTHER): Payer: Medicaid Other | Attending: General Surgery | Admitting: General Surgery

## 2022-06-08 DIAGNOSIS — I87332 Chronic venous hypertension (idiopathic) with ulcer and inflammation of left lower extremity: Secondary | ICD-10-CM | POA: Insufficient documentation

## 2022-06-08 DIAGNOSIS — L97328 Non-pressure chronic ulcer of left ankle with other specified severity: Secondary | ICD-10-CM | POA: Insufficient documentation

## 2022-06-21 ENCOUNTER — Encounter (HOSPITAL_BASED_OUTPATIENT_CLINIC_OR_DEPARTMENT_OTHER): Payer: Medicaid Other | Attending: General Surgery | Admitting: General Surgery

## 2022-06-21 DIAGNOSIS — M85872 Other specified disorders of bone density and structure, left ankle and foot: Secondary | ICD-10-CM | POA: Diagnosis not present

## 2022-06-21 DIAGNOSIS — I87332 Chronic venous hypertension (idiopathic) with ulcer and inflammation of left lower extremity: Secondary | ICD-10-CM | POA: Insufficient documentation

## 2022-06-21 DIAGNOSIS — F17200 Nicotine dependence, unspecified, uncomplicated: Secondary | ICD-10-CM | POA: Diagnosis not present

## 2022-06-21 DIAGNOSIS — L97328 Non-pressure chronic ulcer of left ankle with other specified severity: Secondary | ICD-10-CM | POA: Diagnosis not present

## 2022-06-21 DIAGNOSIS — L97322 Non-pressure chronic ulcer of left ankle with fat layer exposed: Secondary | ICD-10-CM | POA: Diagnosis not present

## 2022-06-21 DIAGNOSIS — K2950 Unspecified chronic gastritis without bleeding: Secondary | ICD-10-CM | POA: Insufficient documentation

## 2022-06-21 NOTE — Progress Notes (Signed)
[name] ([id number]) [id number] Page 1 of 8 Visit Report for 06/21/2022 Arrival Information Details Patient Name: Date of Service: [name] 06/21/2022 10:15 A M Medical Record Number: [id number] Patient Account Number: [id number] Date of Birth/Sex: Treating RN: 1975-09-20 (46 y.o. [name] Primary Care Antoninette Lerner: PA Zenovia Jordan, [place] Other Clinician: Referring [name] [name]: Treating Dorthey Depace/Extender: Horton Marshall in Treatment: 86 Visit Information History Since Last Visit Added or deleted any medications: No Patient Arrived: Ambulatory Any new allergies or adverse reactions: No Arrival Time: 10:48 Had a fall or experienced change in No Accompanied By: interprter activities of daily living that may affect Transfer Assistance: None risk of falls: Patient Identification Verified: Yes Signs or symptoms of abuse/neglect since last visito No Patient Requires Transmission-Based Precautions: No Hospitalized since last visit: No Patient Has Alerts: No Implantable device outside of the clinic excluding No cellular tissue based products placed in the center since last visit: Has Dressing in Place as Prescribed: Yes Pain Present Now: Yes Electronic Signature(s) Signed: 06/21/2022 5:26:40 PM By: Karie Schwalbe RN Entered By: Karie Schwalbe on 06/21/2022 10:48:32 -------------------------------------------------------------------------------- Compression Therapy Details Patient Name: Date of Service: [name] [name] Marseilles NA [name] 06/21/2022 10:15 A M Medical Record Number: [id number] Patient Account Number: [id number] Date of Birth/Sex: Treating RN: 10/05/1975 (46 y.o. [name] Primary Care Caleel Kiner: PA Zenovia Jordan, [place] Other Clinician: Referring Alma Mohiuddin: Treating Georgette Helmer/Extender: Horton Marshall in Treatment: 83 Compression Therapy Performed for Wound Assessment: Wound #1 Left  Ankle Performed By: Clinician Karie Schwalbe, RN Compression Type: Three Layer Post Procedure Diagnosis Same as Pre-procedure Electronic Signature(s) Signed: 06/21/2022 5:26:40 PM By: Karie Schwalbe RN Entered By: Karie Schwalbe on 06/21/2022 13:58:07 [name] ([id number]) [id number] Page 2 of 8 -------------------------------------------------------------------------------- Encounter Discharge Information Details Patient Name: Date of Service: [name] 06/21/2022 10:15 A M Medical Record Number: [id number] Patient Account Number: [id number] Date of Birth/Sex: Treating RN: 02/05/1976 (46 y.o. [name] Primary Care Aniaya Bacha: PA Zenovia Jordan, [place] Other Clinician: Referring Emmalynn Pinkham: Treating Zahirah Cheslock/Extender: Horton Marshall in Treatment: 53 Encounter Discharge Information Items Post Procedure Vitals Discharge Condition: Stable Temperature (F): 97.7 Ambulatory Status: Ambulatory Pulse (bpm): 75 Discharge Destination: Home Respiratory Rate (breaths/min): 16 Transportation: Private Auto Blood Pressure (mmHg): 113/75 Accompanied By: INTERPRETER Schedule Follow-up Appointment: Yes Clinical Summary of Care: Patient Declined Electronic Signature(s) Signed: 06/21/2022 5:26:40 PM By: Karie Schwalbe RN Entered By: Karie Schwalbe on 06/21/2022 14:01:28 -------------------------------------------------------------------------------- Lower Extremity Assessment Details Patient Name: Date of Service: [name] [name] Marseilles NA [name] 06/21/2022 10:15 A M Medical Record Number: [id number] Patient Account Number: [id number] Date of Birth/Sex: Treating RN: 1975-10-29 (46 y.o. [name] Primary Care Jaydn Fincher: PA Zenovia Jordan, [place] Other Clinician: Referring Stephanye Finnicum: Treating Emilly Lavey/Extender: Horton Marshall in Treatment: 71 Edema Assessment Assessed: [Left: No] [Right: No] Edema: [Left: Ye] [Right:  s] Calf Left: Right: Point of Measurement: 28 cm From Medial Instep 31 cm Ankle Left: Right: Point of Measurement: 8 cm From Medial Instep 23.5 cm Vascular Assessment Pulses: Dorsalis Pedis Palpable: [Left:Yes] Electronic Signature(s) Signed: 06/21/2022 5:26:40 PM By: Karie Schwalbe RN Entered By: Karie Schwalbe on 06/21/2022 10:57:56 Multi Wound Chart Details -------------------------------------------------------------------------------- [name] ([id number]) [id number] Page 3 of 8 Patient Name: Date of Service: [name] 06/21/2022 10:15 A M Medical Record Number: [id number] Patient Account Number: [id number] Date of Birth/Sex: Treating RN: 1976/04/26 (46 y.o. M) Primary Care Arvella Massingale: PA Zenovia Jordan, [place] Other Clinician: Referring Trinette Vera: Treating Areal Cochrane/Extender: Duanne Guess  Dannielle Huhahbura, [name] Weeks in Treatment: 4371 Vital Signs Height(in): Pulse(bpm): 75 Weight(lbs): 170 Blood Pressure(mmHg): 113/75 Body Mass Index(BMI): Temperature(F): 97.7 Respiratory Rate(breaths/min): 16 Wound Assessments Wound Number: 1 N/A N/A Photos: N/A N/A Left Ankle N/A N/A Wound Location: Trauma N/A N/A Wounding Event: Trauma, Other N/A N/A Primary Etiology: 10/14/2020 N/A N/A Date Acquired: 7771 N/A N/A Weeks of Treatment: Open N/A N/A Wound Status: No N/A N/A Wound Recurrence: 1.1x2.1x0.2 N/A N/A Measurements L x W x D (cm) 1.814 N/A N/A A (cm) : rea 0.363 N/A N/A Volume (cm) : 97.10% N/A N/A % Reduction in A rea: 98.50% N/A N/A % Reduction in Volume: Full Thickness Without Exposed N/A N/A Classification: Support Structures Medium N/A N/A Exudate A mount: Serosanguineous N/A N/A Exudate Type: red, brown N/A N/A Exudate Color: Flat and Intact N/A N/A Wound Margin: Small (1-33%) N/A N/A Granulation A mount: Red N/A N/A Granulation Quality: Large (67-100%) N/A N/A Necrotic A mount: Fat Layer (Subcutaneous  Tissue): Yes N/A N/A Exposed Structures: Fascia: No Tendon: No Muscle: No Joint: No Bone: No Large (67-100%) N/A N/A Epithelialization: Debridement - Selective/Open Wound N/A N/A Debridement: Pre-procedure Verification/Time Out 11:19 N/A N/A Taken: Lidocaine 4% Topical Solution N/A N/A Pain Control: Slough N/A N/A Tissue Debrided: Non-Viable Tissue N/A N/A Level: 2.31 N/A N/A Debridement A (sq cm): rea Curette N/A N/A Instrument: Minimum N/A N/A Bleeding: Pressure N/A N/A Hemostasis A chieved: 0 N/A N/A Procedural Pain: 0 N/A N/A Post Procedural Pain: Procedure was tolerated well N/A N/A Debridement Treatment Response: 1.1x2.1x0.2 N/A N/A Post Debridement Measurements L x W x D (cm) 0.363 N/A N/A Post Debridement Volume: (cm) Scarring: Yes N/A N/A Periwound Skin Texture: Dry/Scaly: Yes N/A N/A Periwound Skin Moisture: No Abnormalities Noted N/A N/A Periwound Skin Color: Cool/Cold N/A N/A Temperature: Debridement N/A N/A Procedures Performed: Treatment Notes Electronic Signature(s) Signed: 06/21/2022 11:33:00 AM By: Duanne Guessannon, Jennifer MD FACS Mace, [name] ([id number]) By: Duanne Guessannon, Jennifer MD FACS ([phone]) [id number] Page 4 of 8 Signed: 06/21/2022 11:33:00 AM Entered By: Duanne Guessannon, Jennifer on 06/21/2022 11:33:00 -------------------------------------------------------------------------------- Multi-Disciplinary Care Plan Details Patient Name: Date of Service: [name] [name] MarseillesYISHIMYE, A [place] [name] 06/21/2022 10:15 A M Medical Record Number: [id number] Patient Account Number: [id number] Date of Birth/Sex: Treating RN: 11/28/1975 (46 y.o. Dianna LimboM) Scotton, Joanne Primary Care Diona Peregoy: PA Zen[place]ia [place], [place] Other Clinician: Referring Norely Schlick: Treating Emalia Witkop/Extender: Horton Marshallannon, Jennifer Dahbura, [name] Weeks in Treatment: 2971 Multidisciplinary Care Plan reviewed with physician Active Inactive Venous Leg Ulcer Nursing Diagnoses: Actual venous Insuffiency (use after  diagnosis is confirmed) Knowledge deficit related to disease process and management Goals: Patient will maintain optimal edema control Date Initiated: 02/19/2022 Target Resolution Date: 10/30/2022 Goal Status: Active Interventions: Assess peripheral edema status every visit. Compression as ordered Treatment Activities: Therapeutic compression applied : 02/19/2022 Notes: Wound/Skin Impairment Nursing Diagnoses: Knowledge deficit related to ulceration/compromised skin integrity Goals: Patient/caregiver will verbalize understanding of skin care regimen Date Initiated: 02/05/2021 Target Resolution Date: 10/30/2022 Goal Status: Active Interventions: Assess patient/caregiver ability to obtain necessary supplies Assess patient/caregiver ability to perform ulcer/skin care regimen upon admission and as needed Provide education on ulcer and skin care Treatment Activities: Skin care regimen initiated : 02/05/2021 Topical wound management initiated : 02/05/2021 Notes: 03/31/21: Wound care regimen ongoing, target date extended. 04/21/21: Wound care ongoing, through interpreter patient states he is doing fine with his dressing changes. Electronic Signature(s) Signed: 06/21/2022 5:26:40 PM By: Karie SchwalbeScotton, Joanne RN Entered By: Karie SchwalbeScotton, Joanne on 06/21/2022 13:58:32 [name] [name] ([id number]) [id number] Page 5 of 8 -------------------------------------------------------------------------------- Pain Assessment Details Patient Name: Date  of Service: [name] [name] Marseilles NA [name] 06/21/2022 10:15 A M Medical Record Number: [id number] Patient Account Number: [id number] Date of Birth/Sex: Treating RN: Jan 22, 1976 (46 y.o. [name] Primary Care Zasha Belleau: PA Zenovia Jordan, [place] Other Clinician: Referring Cherysh Epperly: Treating Simone Rodenbeck/Extender: Horton Marshall in Treatment: 51 Active Problems Location of Pain Severity and Description of Pain Patient Has Paino  Yes Site Locations Pain Location: Pain in Ulcers With Dressing Change: Yes Duration of the Pain. Constant / Intermittento Constant Rate the pain. Current Pain Level: 5 Worst Pain Level: 10 Least Pain Level: 0 Tolerable Pain Level: 5 Character of Pain Describe the Pain: Difficult to Pinpoint Pain Management and Medication Current Pain Management: Medication: Yes Cold Application: No Rest: No Massage: No Activity: No T.E.N.S.: No Heat Application: No Leg drop or elevation: No Is the Current Pain Management Adequate: Adequate How does your wound impact your activities of daily livingo Sleep: No Bathing: No Appetite: No Relationship With Others: No Bladder Continence: No Emotions: No Bowel Continence: No Work: No Toileting: No Drive: No Dressing: No Hobbies: No Electronic Signature(s) Signed: 06/21/2022 5:26:40 PM By: Karie Schwalbe RN Entered By: Karie Schwalbe on 06/21/2022 10:50:47 -------------------------------------------------------------------------------- Patient/Caregiver Education Details Patient Name: Date of Service: [name] [name] Marseilles NA [name] 11/20/2023andnbsp10:15 A M Medical Record Number: [id number] Patient Account Number: [id number] Date of Birth/Gender: Treating RN: Sep 15, 1975 (46 y.o. Staci Acosta, Joanne Laverg[place], [name] ([id number]) [id number] Page 6 of 8 Primary Care Physician: PA Zenovia Jordan, [place] Other Clinician: Referring Physician: Treating Physician/Extender: Horton Marshall in Treatment: 27 Education Assessment Education Provided To: Patient Education Topics Provided Wound/Skin Impairment: Methods: Explain/Verbal Responses: Return demonstration correctly Electronic Signature(s) Signed: 06/21/2022 5:26:40 PM By: Karie Schwalbe RN Entered By: Karie Schwalbe on 06/21/2022 13:58:46 -------------------------------------------------------------------------------- Wound Assessment Details Patient  Name: Date of Service: [name] [name] Marseilles NA [name] 06/21/2022 10:15 A M Medical Record Number: [id number] Patient Account Number: [id number] Date of Birth/Sex: Treating RN: Jul 12, 1976 (46 y.o. [name] Primary Care Nhyla Nappi: PA Zenovia Jordan, [place] Other Clinician: Referring Samuele Storey: Treating Shameika Speelman/Extender: Horton Marshall in Treatment: 71 Wound Status Wound Number: 1 Primary Etiology: Trauma, Other Wound Location: Left Ankle Wound Status: Open Wounding Event: Trauma Date Acquired: 10/14/2020 Weeks Of Treatment: 71 Clustered Wound: No Photos Wound Measurements Length: (cm) 1.1 Width: (cm) 2.1 Depth: (cm) 0.2 Area: (cm) 1.814 Volume: (cm) 0.363 % Reduction in Area: 97.1% % Reduction in Volume: 98.5% Epithelialization: Large (67-100%) Tunneling: No Undermining: No Wound Description Classification: Full Thickness Without Exposed Support Structures Wound Margin: Flat and Intact Exudate Amount: Medium Exudate Type: Serosanguineous Exudate Color: red, brown [name] [name] ([id number]) Wound Bed Granulation Amount: Small (1-33%) Granulation Quality: Red Necrotic Amount: Large (67-100%) Necrotic Quality: Adherent Slough Foul Odor After Cleansing: No Slough/Fibrino Yes [id number] Page 7 of 8 Exposed Structure Fascia Exposed: No Fat Layer (Subcutaneous Tissue) Exposed: Yes Tendon Exposed: No Muscle Exposed: No Joint Exposed: No Bone Exposed: No Periwound Skin Texture Texture Color No Abnormalities Noted: No No Abnormalities Noted: Yes Scarring: Yes Temperature / Pain Temperature: Cool/Cold Moisture No Abnormalities Noted: No Dry / Scaly: Yes Treatment Notes Wound #1 (Ankle) Wound Laterality: Left Cleanser Soap and Water Discharge Instruction: May shower and wash wound with dial antibacterial soap and water prior to dressing change. Wound Cleanser Discharge Instruction: Cleanse the wound with wound cleanser  prior to applying a clean dressing using gauze sponges, not tissue or cotton balls. Peri-Wound Care Sween Lotion (Moisturizing lotion) Discharge Instruction: Apply moisturizing lotion as directed Topical Primary Dressing  Endoform 2x2 in Discharge Instruction: Moisten with saline Secondary Dressing Drawtex 4x4 in Discharge Instruction: Apply over primary dressing as directed. Zetuvit Plus 4x8 in Discharge Instruction: Apply over primary dressing as directed. Secured With Compression Wrap ThreePress (3 layer compression wrap) Discharge Instruction: Apply three layer compression as directed. Compression Stockings Add-Ons Electronic Signature(s) Signed: 06/21/2022 5:26:40 PM By: Karie Schwalbe RN Entered By: Karie Schwalbe on 06/21/2022 11:13:36 -------------------------------------------------------------------------------- Vitals Details Patient Name: Date of Service: [name] [name] Marseilles NA [name] 06/21/2022 10:15 A M Medical Record Number: [id number] Patient Account Number: [id number] Date of Birth/Sex: Treating RN: 05-29-1976 (46 y.o. [name] Primary Care Aiva Miskell: PA Zenovia Jordan, [place] Other Clinician: Referring Demitrius Crass: Treating Yamna Mackel/Extender: Horton Marshall in Treatment: [address] Dinh, [name] ([id number]) [id number] Page 8 of 8 Vital Signs Time Taken: 10:47 Temperature (F): 97.7 Weight (lbs): 170 Pulse (bpm): 75 Respiratory Rate (breaths/min): 16 Blood Pressure (mmHg): 113/75 Reference Range: 80 - 120 mg / dl Electronic Signature(s) Signed: 06/21/2022 5:26:40 PM By: Karie Schwalbe RN Entered By: Karie Schwalbe on 06/21/2022 10:50:13

## 2022-06-21 NOTE — Progress Notes (Signed)
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 applied to wound bed Bathing/ Shower/ Hygiene: May shower with protection but do not get wound dressing(s) wet. Edema Control - Lymphedema / SCD / Other: Elevate legs to the level of the heart or above for 30 minutes daily and/or when sitting, a frequency of: - 3-4 times a day throughout  the day. Avoid standing for long periods of time. Exercise  regularly Off-Loading: Open toe surgical shoe to: - left foot WOUND #1: - Ankle Wound Laterality: Left Cleanser: Soap and Water 1 x Per Week/30 Days Discharge Instructions: May shower and wash wound with dial antibacterial soap and water prior to dressing change. Cleanser: Wound Cleanser 1 x Per Week/30 Days Discharge Instructions: Cleanse the wound with wound cleanser prior to applying a clean dressing using gauze sponges, not tissue or cotton balls. Peri-Wound Care: Sween Lotion (Moisturizing lotion) 1 x Per Week/30 Days Discharge Instructions: Apply moisturizing lotion as directed Prim Dressing: Endoform 2x2 in 1 x Per Week/30 Days ary Discharge Instructions: Moisten with saline Secondary Dressing: Drawtex 4x4 in 1 x Per Week/30 Days Discharge Instructions: Apply over primary dressing as directed. Secondary Dressing: Zetuvit Plus 4x8 in 1 x Per Week/30 Days Discharge Instructions: Apply over primary dressing as directed. Com pression Wrap: ThreePress (3 layer compression wrap) 1 x Per Week/30 Days Discharge Instructions: Apply three layer compression as directed. 06/21/2022: The wound is a little bit larger in all dimensions today. The surface is slightly grayish and fibrotic. There is slough accumulation on the surface as well as extensive periwound eschar. I used a curette to debride slough and eschar from the wound. We also wrote a letter to the patient's employer today indicating to them that his current work conditions are causing deterioration of the wound and that he needs to be moved to a dry area and 1 in which she is able to move freely throughout the day. We will continue endoform and 3 layer compression. Follow-up in 1 week. Electronic Signature(s) Signed: 06/21/2022 1:16:24 PM By: [name] MD FACS Signed: 06/21/2022 5:26:40 PM By: Dellie Catholic RN Previous Signature: 06/21/2022 11:38:23 AM Version By: [name] MD FACS Entered By: Dellie Catholic on  06/21/2022 12:51:54 -------------------------------------------------------------------------------- HxROS Details Patient Name: Date of Service: [name] NA [place] 06/21/2022 10:15 A M Medical Record Number: [id number] Patient Account Number: [id number] Date of Birth/Sex: Treating RN: 02-Feb-1976 (46 y.o. M) Primary Care Provider: PA [name], NO Other Clinician: Referring Provider: Treating Provider/Extender: [name] in Treatment: 78 Information Obtained From Patient [name], [name] ([id number]) [id number] Page 11 of 11 Gastrointestinal Medical History: Past Medical History Notes: Chronic Gastritis Immunizations Pneumococcal Vaccine: Received Pneumococcal Vaccination: No Implantable Devices No devices added Family and Social History Unknown History: Yes; Current every day smoker; Marital Status - Married; Alcohol Use: Rarely; Drug Use: No History; Caffeine Use: Moderate; Financial Concerns: No; Food, Clothing or Shelter Needs: No; Support System Lacking: No; Transportation Concerns: No Electronic Signature(s) Signed: 06/21/2022 11:39:09 AM By: [name] MD FACS Entered By: [name] on 06/21/2022 11:34:51 -------------------------------------------------------------------------------- SuperBill Details Patient Name: Date of Service: [name] NA [place] 06/21/2022 Medical Record Number: [id number] Patient Account Number: [id number] Date of Birth/Sex: Treating RN: September 06, 1975 (46 y.o. 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 Chronic venous hypertension (idiopathic) with ulcer and inflammation of left lower extremity Facility Procedures : CPT4 Code:  [id number] Description: [id number] - DEBRIDE WOUND 1ST 20 SQ CM OR < ICD-10 Diagnosis Description L97.328 Non-pressure chronic ulcer of left ankle with other specified severity Modifier: Quantity: 1 Physician Procedures : CPT4 Code Description Modifier [id number] - WC PHYS LEVEL 3 - EST PT 25 ICD-10 Diagnosis  Description L97.328 Non-pressure chronic ulcer of left ankle with other specified severity I87.332 Chronic venous hypertension (idiopathic) with ulcer and  inflammation of left lower extremity Quantity: 1 : [id number] - WC PHYS DEBR WO ANESTH 20 SQ CM ICD-10 Diagnosis Description L97.328 Non-pressure chronic ulcer of left ankle with other specified severity Quantity: 1 Electronic Signature(s) Signed: 06/21/2022 11:38:37 AM By: [name] MD FACS Entered By: [name] on 06/21/2022 11:38:36

## 2022-06-29 ENCOUNTER — Encounter (HOSPITAL_BASED_OUTPATIENT_CLINIC_OR_DEPARTMENT_OTHER): Payer: Medicaid Other | Admitting: General Surgery

## 2022-06-29 DIAGNOSIS — I87332 Chronic venous hypertension (idiopathic) with ulcer and inflammation of left lower extremity: Secondary | ICD-10-CM | POA: Diagnosis not present

## 2022-06-29 DIAGNOSIS — S91002A Unspecified open wound, left ankle, initial encounter: Secondary | ICD-10-CM | POA: Diagnosis not present

## 2022-06-29 DIAGNOSIS — L97328 Non-pressure chronic ulcer of left ankle with other specified severity: Secondary | ICD-10-CM | POA: Diagnosis not present

## 2022-06-29 NOTE — Progress Notes (Signed)
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 Appointments: Return Appointment in 1 week. - Dr. Lady Gary - Room 4 Other: - interpreter required.  Anesthetic: (In clinic) Topical Lidocaine 4% applied to wound bed Bathing/ Shower/ Hygiene: May  shower with protection but do not get wound dressing(s) wet. Edema Control - Lymphedema / SCD / Other: Elevate legs to the level of the heart or above for 30 minutes daily and/or when sitting, a frequency of: - 3-4 times a day throughout the day. Avoid standing for long periods of time. Exercise regularly Off-Loading: Open toe surgical shoe to: - left foot The following medication(s) was prescribed: lidocaine topical 5 % ointment ointment topical once daily for prior to debridement was prescribed at facility WOUND #1: - Ankle Wound Laterality: Left Cleanser: Soap and Water 1 x Per Week/30 Days Discharge Instructions: May shower and wash wound with dial antibacterial soap and water prior to dressing change. Cleanser: Wound Cleanser 1 x Per Week/30 Days Discharge Instructions: Cleanse the wound with wound cleanser prior to applying a clean dressing using gauze sponges, not tissue or cotton balls. Peri-Wound Care: Sween Lotion (Moisturizing lotion) 1 x Per Week/30 Days Discharge Instructions: Apply moisturizing lotion as directed Prim Dressing: Endoform 2x2 in 1 x Per Week/30 Days ary Discharge Instructions: Moisten with saline Secondary Dressing: Drawtex 4x4 in 1 x Per Week/30 Days Discharge Instructions: Apply over primary dressing as directed. Secondary Dressing: Zetuvit Plus 4x8 in 1 x Per Week/30 Days Discharge Instructions: Apply over primary dressing as directed. Com pression Wrap: ThreePress (3 layer compression wrap) 1 x Per Week/30 Days Discharge Instructions: Apply three layer compression as directed. 06/29/2022: The wound is about the same size, but it is quite clean and the surface has more of a pink color. I used a curette to debride slough and eschar from the wound. We will continue with endoform and 3 layer compression. Follow-up in 1 week. Electronic Signature(s) Signed: 06/29/2022 10:46:24 AM By: [name] FACS Entered By: Duanne Guess on 06/29/2022  10:46:24 -------------------------------------------------------------------------------- HxROS Details Patient Name: Date of Service: [name], [name] 06/29/2022 9:15 A M Medical Record Number: [id number] Patient Account Number: [id number] NDAYISHIMYEPaige, Vanderwoude ([id number]) [id number] Page 11 of 12 Date of Birth/Sex: Treating RN: 06-Sep-1975 (46 y.o. M) Primary Care Provider: Other Clinician: PA TIENT, NO Referring Provider: Treating Provider/Extender: Horton Marshall in Treatment: 72 Information Obtained From Patient Gastrointestinal Medical History: Past Medical History Notes: Chronic Gastritis Immunizations Pneumococcal Vaccine: Received Pneumococcal Vaccination: No Implantable Devices No devices added Family and Social History Unknown History: Yes; Current every day smoker; Marital Status - Married; Alcohol Use: Rarely; Drug Use: No History; Caffeine Use: Moderate; Financial Concerns: No; Food, Clothing or Shelter Needs: No; Support System Lacking: No; Transportation Concerns: No Psychologist, prison and probation services) Signed: 06/29/2022 12:17:40 PM By: [name] FACS Entered By: Duanne Guess on 06/29/2022 10:45:00 -------------------------------------------------------------------------------- SuperBill Details Patient Name: Date of Service: [name] 06/29/2022 Medical Record Number: [id number] Patient Account Number: [id number] Date of Birth/Sex: Treating RN: 23-Jan-1976 (46 y.o. M) Primary Care Provider: PA Zenovia Jordan, NO Other Clinician: Referring Provider: Treating Provider/Extender: Horton Marshall in Treatment: 72 Diagnosis Coding ICD-10 Codes Code Description L97.328 Non-pressure chronic ulcer of left ankle with other specified severity I87.332 Chronic venous hypertension (idiopathic) with ulcer and inflammation of left lower extremity Facility Procedures : CPT4 Code:  [id number] Description: 97597 - DEBRIDE WOUND 1ST 20 SQ CM OR < ICD-10 Diagnosis Description L97.328 Non-pressure chronic ulcer of left ankle with other specified severity Modifier: Quantity: 1 Physician Procedures : CPT4 Code Description Modifier [id number] 99213 - WC PHYS LEVEL 3 - EST  PT 25 ICD-10 Diagnosis Description L97.328 Non-pressure chronic ulcer of left ankle with other specified severity I87.332 Chronic venous hypertension (idiopathic) with ulcer and  inflammation of left lower extremity Quantity: 1 : [id number] 97597 - WC PHYS DEBR WO ANESTH 20 SQ CM ICD-10 Diagnosis Description L97.328 Non-pressure chronic ulcer of left ankle with other specified severity Massimino, [name] ([id number]) [id number] Quantity: 1 Page 12 of 12 Electronic Signature(s) Signed: 06/29/2022 10:46:38 AM By: [name] FACS Entered By: Duanne Guess on 06/29/2022 10:46:38

## 2022-06-29 NOTE — Progress Notes (Signed)
[name], [name] ([id number]) [id number] Page 1 of 8 Visit Report for 06/29/2022 Arrival Information Details Patient Name: Date of Service: [name] [place] NIE 06/29/2022 9:15 A M Medical Record Number: [id number] Patient Account Number: [id number] Date of Birth/Sex: Treating RN: [birth year] (46 y.o. [name] Primary Care [name]: PA [name], NO Other Clinician: Referring [name]: Treating [name]/Extender: [name] in Treatment: 72 Visit Information History Since Last Visit Added or deleted any medications: No Patient Arrived: Crutches Any new allergies or adverse reactions: No Arrival Time: 09:28 Had a fall or experienced change in No Accompanied By: self activities of daily living that may affect Transfer Assistance: None risk of falls: Patient Identification Verified: Yes Signs or symptoms of abuse/neglect since last visito No Secondary Verification Process Completed: Yes Hospitalized since last visit: No Patient Requires Transmission-Based Precautions: No Implantable device outside of the clinic excluding No Patient Has Alerts: No cellular tissue based products placed in the center since last visit: Has Dressing in Place as Prescribed: Yes Has Compression in Place as Prescribed: Yes Pain Present Now: Yes Electronic Signature(s) Signed: 06/29/2022 5:04:15 PM By: [name] Pulling RN, BSN Entered By: [name] on 06/29/2022 09:32:40 -------------------------------------------------------------------------------- Compression Therapy Details Patient Name: Date of Service: [name] 06/29/2022 9:15 A M Medical Record Number: [id number] Patient Account Number: [id number] Date of Birth/Sex: Treating RN: November 19, 1975 (46 y.o. [name] Primary Care Ameriah Lint: PA [name], [place] Other Clinician: Referring [name]: Treating [name]/Extender: [name] in Treatment:  72 Compression Therapy Performed for Wound Assessment: Wound #1 Left Ankle Performed By: Clinician [name] Pulling, RN Compression Type: Three Layer Post Procedure Diagnosis Same as Pre-procedure Electronic Signature(s) Signed: 06/29/2022 5:04:15 PM By: [name] Pulling RN, BSN Entered By: [name] on 06/29/2022 16:46:43 [name], [name] ([id number]) [id number] Page 2 of 8 -------------------------------------------------------------------------------- Encounter Discharge Information Details Patient Name: Date of Service: [name] [place] NIE 06/29/2022 9:15 A M Medical Record Number: [id number] Patient Account Number: [id number] Date of Birth/Sex: Treating RN: [birth year] (46 y.o. [name] Primary Care [name]: PA [name], [place] Other Clinician: Referring [name]: Treating [name]/Extender: [name] in Treatment: 72 Encounter Discharge Information Items Post Procedure Vitals Discharge Condition: Stable Temperature (F): 97.6 Ambulatory Status: Crutches Pulse (bpm): 73 Discharge Destination: Home Respiratory Rate (breaths/min): 16 Transportation: Private Auto Blood Pressure (mmHg): 115/76 Accompanied By: self Schedule Follow-up Appointment: Yes Clinical Summary of Care: Patient Declined Electronic Signature(s) Signed: 06/29/2022 5:04:15 PM By: [name] Pulling RN, BSN Entered By: [name] on 06/29/2022 09:54:21 -------------------------------------------------------------------------------- Lower Extremity Assessment Details Patient Name: Date of Service: [name] 06/29/2022 9:15 A M Medical Record Number: [id number] Patient Account Number: [id number] Date of Birth/Sex: Treating RN: 02/17/1976 (46 y.o. [name] Primary Care [name]: PA [name], NO Other Clinician: Referring [name]: Treating [name]/Extender: [name] in Treatment: 72 Edema  Assessment Assessed: [Left: No] [Right: No] Edema: [Left: Ye] [Right: s] Calf Left: Right: Point of Measurement: 28 cm From Medial Instep 30.5 cm Ankle Left: Right: Point of Measurement: 8 cm From Medial Instep 21.5 cm Vascular Assessment Pulses: Dorsalis Pedis Palpable: [Left:Yes] Electronic Signature(s) Signed: 06/29/2022 5:04:15 PM By: [name] Pulling RN, BSN Entered By: [name] on 06/29/2022 09:40:44 Multi Wound Chart Details -------------------------------------------------------------------------------- [name] ([id number]) [id number] Page 3 of 8 Patient Name: Date of Service: [name] [place] NIE 06/29/2022 9:15 A M Medical Record Number: [id number] Patient Account Number: [id number] Date of Birth/Sex: Treating RN: 06-26-1976 (46  y.o. M) Primary Care [name]: PA [name] JordanIENT, NO Other Clinician: Referring [name]: Treating [name]/Extender: [name] Marshallannon, [name], [name] in Treatment: 72 Vital Signs Height(in): Pulse(bpm): 73 Weight(lbs): 170 Blood Pressure(mmHg): 115/76 Body Mass Index(BMI): Temperature(F): 97.6 Respiratory Rate(breaths/min): 16 Wound Assessments Wound Number: 1 N/A N/A Photos: N/A N/A Left Ankle N/A N/A Wound Location: Trauma N/A N/A Wounding Event: Trauma, Other N/A N/A Primary Etiology: 10/14/2020 N/A N/A Date Acquired: 4372 N/A N/A [name] of Treatment: Open N/A N/A Wound Status: No N/A N/A Wound Recurrence: 1.6x2.3x0.2 N/A N/A Measurements L x W x D (cm) 2.89 N/A N/A A (cm) : rea 0.578 N/A N/A Volume (cm) : 95.30% N/A N/A % Reduction in A rea: 97.70% N/A N/A % Reduction in Volume: Full Thickness Without Exposed N/A N/A Classification: Support Structures Medium N/A N/A Exudate A mount: Serosanguineous N/A N/A Exudate Type: red, brown N/A N/A Exudate Color: Flat and Intact N/A N/A Wound Margin: Large (67-100%) N/A N/A Granulation A mount: Red N/A N/A Granulation  Quality: Small (1-33%) N/A N/A Necrotic A mount: Fat Layer (Subcutaneous Tissue): Yes N/A N/A Exposed Structures: Fascia: No Tendon: No Muscle: No Joint: No Bone: No Large (67-100%) N/A N/A Epithelialization: Debridement - Selective/Open Wound N/A N/A Debridement: Pre-procedure Verification/Time Out 09:45 N/A N/A Taken: Lidocaine 5% topical ointment N/A N/A Pain Control: Necrotic/Eschar, Slough N/A N/A Tissue Debrided: Non-Viable Tissue N/A N/A Level: 3.68 N/A N/A Debridement A (sq cm): rea Curette N/A N/A Instrument: Minimum N/A N/A Bleeding: Pressure N/A N/A Hemostasis A chieved: 0 N/A N/A Procedural Pain: 0 N/A N/A Post Procedural Pain: Procedure was tolerated well N/A N/A Debridement Treatment Response: 1.6x2.3x0.2 N/A N/A Post Debridement Measurements L x W x D (cm) 0.578 N/A N/A Post Debridement Volume: (cm) Scarring: Yes N/A N/A Periwound Skin Texture: Dry/Scaly: Yes N/A N/A Periwound Skin Moisture: No Abnormalities Noted N/A N/A Periwound Skin Color: Cool/Cold N/A N/A Temperature: Debridement N/A N/A Procedures Performed: Treatment Notes Wound #1 (Ankle) Wound Laterality: Left Cleanser Angeletti, [name] ([id number]) [id number] Page 4 of 8 Soap and Water Discharge Instruction: May shower and wash wound with dial antibacterial soap and water prior to dressing change. Wound Cleanser Discharge Instruction: Cleanse the wound with wound cleanser prior to applying a clean dressing using gauze sponges, not tissue or cotton balls. Peri-Wound Care Sween Lotion (Moisturizing lotion) Discharge Instruction: Apply moisturizing lotion as directed Topical Primary Dressing Endoform 2x2 in Discharge Instruction: Moisten with saline Secondary Dressing Drawtex 4x4 in Discharge Instruction: Apply over primary dressing as directed. Zetuvit Plus 4x8 in Discharge Instruction: Apply over primary dressing as directed. Secured  With Compression Wrap ThreePress (3 layer compression wrap) Discharge Instruction: Apply three layer compression as directed. Compression Stockings Add-Ons Electronic Signature(s) Signed: 06/29/2022 10:43:24 AM By: Duanne Guessannon, Jennifer MD FACS Entered By: Duanne Guessannon, [name] on 06/29/2022 10:43:24 -------------------------------------------------------------------------------- Multi-Disciplinary Care Plan Details Patient Name: Date of Service: [name] [name] MarseillesYISHIMYE, [name] 06/29/2022 9:15 A M Medical Record Number: [id number] Patient Account Number: [id number] Date of Birth/Sex: Treating RN: 07/25/1976 (46 y.o. [name] CoolsM) Palmer, Carrie Primary Care Meshell Abdulaziz: PA [name] JordanIENT, [place] Other Clinician: Referring Mylah Baynes: Treating Xiamara Hulet/Extender: [name] Marshallannon, [name], [name] in Treatment: 72 Multidisciplinary Care Plan reviewed with physician Active Inactive Venous Leg Ulcer Nursing Diagnoses: Actual venous Insuffiency (use after diagnosis is confirmed) Knowledge deficit related to disease process and management Goals: Patient will maintain optimal edema control Date Initiated: 02/19/2022 Target Resolution Date: 10/30/2022 Goal Status: Active Interventions: Assess peripheral edema status every visit. Compression as ordered Treatment Activities: Therapeutic compression applied : 02/19/2022 [place], [name] (  [id number]) [id number] Page 5 of 8 Notes: Wound/Skin Impairment Nursing Diagnoses: Knowledge deficit related to ulceration/compromised skin integrity Goals: Patient/caregiver will verbalize understanding of skin care regimen Date Initiated: 02/05/2021 Target Resolution Date: 10/30/2022 Goal Status: Active Interventions: Assess patient/caregiver ability to obtain necessary supplies Assess patient/caregiver ability to perform ulcer/skin care regimen upon admission and as needed Provide education on ulcer and skin care Treatment Activities: Skin care regimen initiated :  02/05/2021 Topical wound management initiated : 02/05/2021 Notes: 03/31/21: Wound care regimen ongoing, target date extended. 04/21/21: Wound care ongoing, through interpreter patient states he is doing fine with his dressing changes. Electronic Signature(s) Signed: 06/29/2022 5:04:15 PM By: [name] Pulling RN, BSN Entered By: [name] on 06/29/2022 09:49:51 -------------------------------------------------------------------------------- Pain Assessment Details Patient Name: Date of Service: [name] 06/29/2022 9:15 A M Medical Record Number: [id number] Patient Account Number: [id number] Date of Birth/Sex: Treating RN: 01-04-1976 (46 y.o. [name] Primary Care Jazman Reuter: PA [name], [place] Other Clinician: Referring Lilla Callejo: Treating Carmesha Morocco/Extender: [name] in Treatment: 72 Active Problems Location of Pain Severity and Description of Pain Patient Has Paino Yes Site Locations Rate the pain. Current Pain Level: 8 Pain Management and Medication Current Pain Management: Electronic Signature(s) Rodden, [name] ([id number]) [phone].pdf Page 6 of 8 Signed: 06/29/2022 5:04:15 PM By: [name] Pulling RN, BSN Entered By: [name] on 06/29/2022 09:33:14 -------------------------------------------------------------------------------- Patient/Caregiver Education Details Patient Name: Date of Service: [name] 11/28/2023andnbsp9:15 A M Medical Record Number: [id number] Patient Account Number: [id number] Date of Birth/Gender: Treating RN: 1975/12/27 (46 y.o. [name] Primary Care Physician: PA [name], [place] Other Clinician: Referring Physician: Treating Physician/Extender: [name] in Treatment: 85 Education Assessment Education Provided To: Patient Education Topics Provided Wound/Skin Impairment: Methods: Explain/Verbal Responses: State content  correctly [name]) Signed: 06/29/2022 5:04:15 PM By: [name] Pulling RN, BSN Entered By: [name] on 06/29/2022 09:50:24 -------------------------------------------------------------------------------- Wound Assessment Details Patient Name: Date of Service: [name] 06/29/2022 9:15 A M Medical Record Number: [id number] Patient Account Number: [id number] Date of Birth/Sex: Treating RN: [birth year] (46 y.o. [name] Primary Care Danyka Merlin: PA [name], [place] Other Clinician: Referring Taressa Rauh: Treating Calin Fantroy/Extender: [name] in Treatment: 72 Wound Status Wound Number: 1 Primary Etiology: Trauma, Other Wound Location: Left Ankle Wound Status: Open Wounding Event: Trauma Date Acquired: 10/14/2020 [name] Of Treatment: 72 Clustered Wound: No Photos Demedeiros, [name] ([id number]) [id number] Page 7 of 8 Wound Measurements Length: (cm) 1.6 Width: (cm) 2.3 Depth: (cm) 0.2 Area: (cm) 2.89 Volume: (cm) 0.578 % Reduction in Area: 95.3% % Reduction in Volume: 97.7% Epithelialization: Large (67-100%) Tunneling: No Undermining: No Wound Description Classification: Full Thickness Without Exposed Support Structures Wound Margin: Flat and Intact Exudate Amount: Medium Exudate Type: Serosanguineous Exudate Color: red, brown Foul Odor After Cleansing: No Slough/Fibrino Yes Wound Bed Granulation Amount: Large (67-100%) Exposed Structure Granulation Quality: Red Fascia Exposed: No Necrotic Amount: Small (1-33%) Fat Layer (Subcutaneous Tissue) Exposed: Yes Necrotic Quality: Adherent Slough Tendon Exposed: No Muscle Exposed: No Joint Exposed: No Bone Exposed: No Periwound Skin Texture Texture Color No Abnormalities Noted: No No Abnormalities Noted: Yes Scarring: Yes Temperature / Pain Temperature: Cool/Cold Moisture No Abnormalities Noted: No Dry / Scaly: Yes Treatment Notes Wound  #1 (Ankle) Wound Laterality: Left Cleanser Soap and Water Discharge Instruction: May shower and wash wound with dial antibacterial soap and water prior to dressing change. Wound Cleanser Discharge Instruction: Cleanse the wound with wound cleanser prior to applying  a clean dressing using gauze sponges, not tissue or cotton balls. Peri-Wound Care Sween Lotion (Moisturizing lotion) Discharge Instruction: Apply moisturizing lotion as directed Topical Primary Dressing Endoform 2x2 in Discharge Instruction: Moisten with saline Secondary Dressing Drawtex 4x4 in Discharge Instruction: Apply over primary dressing as directed. Zetuvit Plus 4x8 in Discharge Instruction: Apply over primary dressing as directed. Secured With Compression Wrap ThreePress (3 layer compression wrap) Discharge Instruction: Apply three layer compression as directed. Compression Stockings Add-Ons Electronic Signature(s) Signed: 06/29/2022 5:04:15 PM By: [name] Pulling RN, BSN Entered By: [name] on 06/29/2022 09:42:43 Petsch, [name] ([id number]) [id number] Page 8 of 8 -------------------------------------------------------------------------------- Vitals Details Patient Name: Date of Service: [name] [place] NIE 06/29/2022 9:15 A M Medical Record Number: [id number] Patient Account Number: [id number] Date of Birth/Sex: Treating RN: 1975/08/16 (46 y.o. [name] Primary Care Marshay Slates: PA TIENT, NO Other Clinician: Referring Kardell Virgil: Treating Kolbee Bogusz/Extender: [name] in Treatment: 72 Vital Signs Time Taken: 09:30 Temperature (F): 97.6 Weight (lbs): 170 Pulse (bpm): 73 Respiratory Rate (breaths/min): 16 Blood Pressure (mmHg): 115/76 Reference Range: 80 - 120 mg / dl Electronic Signature(s) Signed: 06/29/2022 5:04:15 PM By: [name] Pulling RN, BSN Entered By: [name] on 06/29/2022 09:33:04

## 2022-07-02 DIAGNOSIS — Z419 Encounter for procedure for purposes other than remedying health state, unspecified: Secondary | ICD-10-CM | POA: Diagnosis not present

## 2022-07-06 ENCOUNTER — Encounter (HOSPITAL_BASED_OUTPATIENT_CLINIC_OR_DEPARTMENT_OTHER): Payer: Medicaid Other | Attending: General Surgery | Admitting: General Surgery

## 2022-07-06 DIAGNOSIS — I872 Venous insufficiency (chronic) (peripheral): Secondary | ICD-10-CM | POA: Diagnosis not present

## 2022-07-06 DIAGNOSIS — M85872 Other specified disorders of bone density and structure, left ankle and foot: Secondary | ICD-10-CM | POA: Insufficient documentation

## 2022-07-06 DIAGNOSIS — L97529 Non-pressure chronic ulcer of other part of left foot with unspecified severity: Secondary | ICD-10-CM | POA: Insufficient documentation

## 2022-07-06 DIAGNOSIS — S91002A Unspecified open wound, left ankle, initial encounter: Secondary | ICD-10-CM | POA: Diagnosis not present

## 2022-07-06 DIAGNOSIS — L97328 Non-pressure chronic ulcer of left ankle with other specified severity: Secondary | ICD-10-CM | POA: Diagnosis not present

## 2022-07-13 ENCOUNTER — Encounter (HOSPITAL_BASED_OUTPATIENT_CLINIC_OR_DEPARTMENT_OTHER): Payer: Medicaid Other | Admitting: Internal Medicine

## 2022-07-13 DIAGNOSIS — I872 Venous insufficiency (chronic) (peripheral): Secondary | ICD-10-CM | POA: Diagnosis not present

## 2022-07-13 DIAGNOSIS — L97328 Non-pressure chronic ulcer of left ankle with other specified severity: Secondary | ICD-10-CM | POA: Diagnosis not present

## 2022-07-13 DIAGNOSIS — M85872 Other specified disorders of bone density and structure, left ankle and foot: Secondary | ICD-10-CM | POA: Diagnosis not present

## 2022-07-13 DIAGNOSIS — L97529 Non-pressure chronic ulcer of other part of left foot with unspecified severity: Secondary | ICD-10-CM | POA: Diagnosis not present

## 2022-07-13 DIAGNOSIS — S91002A Unspecified open wound, left ankle, initial encounter: Secondary | ICD-10-CM | POA: Diagnosis not present

## 2022-07-14 NOTE — Progress Notes (Signed)
[name], [name] ([id number]) [id number] Page 1 of 8 Visit Report for 07/13/2022 Arrival Information Details Patient Name: Date of Service: [name] [place] NIE 07/13/2022 10:15 A M Medical Record Number: [id number] Patient Account Number: [id number] Date of Birth/Sex: Treating RN: 04/23/1976 (46 y.o. M) Primary Care Randy Whitener: PA [name], [place] Other Clinician: Referring Milos Milligan: Treating [name]/Extender: [name] in Treatment: 74 Visit Information History Since Last Visit All ordered tests and consults were completed: No Patient Arrived: Ambulatory Added or deleted any medications: No Arrival Time: 10:38 Any new allergies or adverse reactions: No Accompanied By: self Had a fall or experienced change in No Transfer Assistance: None activities of daily living that may affect Patient Identification Verified: Yes risk of falls: Secondary Verification Process Completed: Yes Signs or symptoms of abuse/neglect since last visito No Patient Requires Transmission-Based Precautions: No Hospitalized since last visit: No Patient Has Alerts: No Implantable device outside of the clinic excluding No cellular tissue based products placed in the center since last visit: Pain Present Now: No Electronic Signature(s) Signed: 07/13/2022 11:27:40 AM By: Worthy Rancher Entered By: Worthy Rancher on 07/13/2022 10:39:32 -------------------------------------------------------------------------------- Compression Therapy Details Patient Name: Date of Service: [name] NA [place] 07/13/2022 10:15 A M Medical Record Number: [id number] Patient Account Number: [id number] Date of Birth/Sex: Treating RN: [birth year] (46 y.o. Collene Gobble Primary Care Avalynne Diver: PA [name], [place] Other Clinician: Referring Zalika Tieszen: Treating Shakeerah Gradel/Extender: [name] in Treatment: 74 Compression Therapy Performed for Wound Assessment: Wound  #1 Left Ankle Performed By: Clinician Dellie Catholic, RN Compression Type: Three Layer Post Procedure Diagnosis Same as Pre-procedure Electronic Signature(s) Signed: 07/13/2022 5:21:34 PM By: Dellie Catholic RN Entered By: Dellie Catholic on 07/13/2022 17:15:48 Mah, [name] ([id number] Page 2 of 8 -------------------------------------------------------------------------------- Encounter Discharge Information Details Patient Name: Date of Service: [name] [place] NIE 07/13/2022 10:15 A M Medical Record Number: [id number] Patient Account Number: [id number] Date of Birth/Sex: Treating RN: [birth year] (46 y.o. Collene Gobble Primary Care Rage Beever: PA [name], [place] Other Clinician: Referring Yatzary Merriweather: Treating Alaycia Eardley/Extender: [name] in Treatment: 74 Encounter Discharge Information Items Post Procedure Vitals Discharge Condition: Stable Temperature (F): 97.7 Ambulatory Status: Ambulatory Pulse (bpm): 65 Discharge Destination: Home Respiratory Rate (breaths/min): 20 Transportation: Private Auto Blood Pressure (mmHg): 113/72 Accompanied By: self/Interpreter Schedule Follow-up Appointment: Yes Clinical Summary of Care: Patient Declined Electronic Signature(s) Signed: 07/13/2022 5:21:34 PM By: Dellie Catholic RN Entered By: Dellie Catholic on 07/13/2022 17:18:19 -------------------------------------------------------------------------------- Lower Extremity Assessment Details Patient Name: Date of Service: [name] NA [place] 07/13/2022 10:15 A M Medical Record Number: [id number] Patient Account Number: [id number] Date of Birth/Sex: Treating RN: 10-Feb-1976 (46 y.o. Collene Gobble Primary Care Jamese Trauger: PA [place], [place] Other Clinician: Referring Manning Luna: Treating Markiya Keefe/Extender: [name] in Treatment: 74 Edema Assessment Assessed: [Left: No] [Right: No] Edema: [Left:  Ye] [Right: s] Calf Left: Right: Point of Measurement: 28 cm From Medial Instep 29.6 cm 19.5 cm Ankle Left: Right: Point of Measurement: 8 cm From Medial Instep 21.2 cm 21.5 cm Knee To Floor Left: Right: From Medial Instep 46 cm 46 cm Vascular Assessment Pulses: Dorsalis Pedis Palpable: [Left:Yes] Electronic Signature(s) Signed: 07/13/2022 5:21:34 PM By: Dellie Catholic RN Entered By: Dellie Catholic on 07/13/2022 11:08:57 [name], [name] ([id number] Page 3 of 8 -------------------------------------------------------------------------------- Multi Wound Chart Details Patient Name: Date of Service: [name] 07/13/2022 10:15 A M Medical Record Number: [id number] Patient Account Number: [id number] Date of Birth/Sex: Treating  RN: November 25, 1975 (46 y.o. M) Primary Care Ernst Cumpston: PA [name], [place] Other Clinician: Referring Oveta Idris: Treating Alysiana Ethridge/Extender: [name] in Treatment: 74 Vital Signs Height(in): 69 Pulse(bpm): 65 Weight(lbs): 170 Blood Pressure(mmHg): 113/72 Body Mass Index(BMI): 25.1 Temperature(F): 97.7 Respiratory Rate(breaths/min): 20 [1:Photos:] [N/A:N/A] Left Ankle N/A N/A Wound Location: Trauma N/A N/A Wounding Event: Trauma, Other N/A N/A Primary Etiology: 10/14/2020 N/A N/A Date Acquired: 55 N/A N/A Weeks of Treatment: Open N/A N/A Wound Status: No N/A N/A Wound Recurrence: 1.8x2.8x0.2 N/A N/A Measurements L x W x D (cm) 3.958 N/A N/A A (cm) : rea 0.792 N/A N/A Volume (cm) : 93.60% N/A N/A % Reduction in A rea: 96.80% N/A N/A % Reduction in Volume: Full Thickness Without Exposed N/A N/A Classification: Support Structures Medium N/A N/A Exudate A mount: Serosanguineous N/A N/A Exudate Type: red, brown N/A N/A Exudate Color: Flat and Intact N/A N/A Wound Margin: Large (67-100%) N/A N/A Granulation A mount: Red N/A N/A Granulation Quality: Small  (1-33%) N/A N/A Necrotic A mount: Fat Layer (Subcutaneous Tissue): Yes N/A N/A Exposed Structures: Fascia: No Tendon: No Muscle: No Joint: No Bone: No Large (67-100%) N/A N/A Epithelialization: Debridement - Excisional N/A N/A Debridement: Pre-procedure Verification/Time Out 11:08 N/A N/A Taken: Lidocaine 5% topical ointment N/A N/A Pain Control: Subcutaneous, Slough N/A N/A Tissue Debrided: Skin/Subcutaneous Tissue N/A N/A Level: 5.04 N/A N/A Debridement A (sq cm): rea Curette N/A N/A Instrument: Minimum N/A N/A Bleeding: Pressure N/A N/A Hemostasis A chieved: 0 N/A N/A Procedural Pain: 0 N/A N/A Post Procedural Pain: Procedure was tolerated well N/A N/A Debridement Treatment Response: 1.8x2.8x0.2 N/A N/A Post Debridement Measurements L x W x D (cm) 0.792 N/A N/A Post Debridement Volume: (cm) Scarring: Yes N/A N/A Periwound Skin Texture: Dry/Scaly: Yes N/A N/A Periwound Skin Moisture: No Abnormalities Noted N/A N/A Periwound Skin Color: Matkins, [name] ([id number] Page 4 of 8 Cool/Cold N/A N/A Temperature: Debridement N/A N/A Procedures Performed: Treatment Notes Electronic Signature(s) Signed: 07/13/2022 3:46:52 PM By: Linton Ham MD Entered By: Linton Ham on 07/13/2022 11:28:02 -------------------------------------------------------------------------------- Multi-Disciplinary Care Plan Details Patient Name: Date of Service: [name] NA [place] 07/13/2022 10:15 A M Medical Record Number: [id number] Patient Account Number: [id number] Date of Birth/Sex: Treating RN: 04-17-1976 (46 y.o. Collene Gobble Primary Care Arcenio Mullaly: PA [name], [place] Other Clinician: Referring Ardath Lepak: Treating Rayma Hegg/Extender: [name] in Treatment: 74 Multidisciplinary Care Plan reviewed with physician Active Inactive Venous Leg Ulcer Nursing Diagnoses: Actual venous Insuffiency (use after  diagnosis is confirmed) Knowledge deficit related to disease process and management Goals: Patient will maintain optimal edema control Date Initiated: 02/19/2022 Target Resolution Date: 10/30/2022 Goal Status: Active Interventions: Assess peripheral edema status every visit. Compression as ordered Treatment Activities: Therapeutic compression applied : 02/19/2022 Notes: Wound/Skin Impairment Nursing Diagnoses: Knowledge deficit related to ulceration/compromised skin integrity Goals: Patient/caregiver will verbalize understanding of skin care regimen Date Initiated: 02/05/2021 Target Resolution Date: 10/30/2022 Goal Status: Active Interventions: Assess patient/caregiver ability to obtain necessary supplies Assess patient/caregiver ability to perform ulcer/skin care regimen upon admission and as needed Provide education on ulcer and skin care Treatment Activities: Skin care regimen initiated : 02/05/2021 Topical wound management initiated : 02/05/2021 Notes: 03/31/21: Wound care regimen ongoing, target date extended. 04/21/21: Wound care ongoing, through interpreter patient states he is doing fine with his dressing changes. Electronic Signature(s) [name], [name] ([id number]) [id number] Page 5 of 8 Signed: 07/13/2022 5:21:34 PM By: Dellie Catholic RN Entered By: Dellie Catholic on 07/13/2022 17:16:26 -------------------------------------------------------------------------------- Pain Assessment Details  Patient Name: Date of Service: [name] [place] 07/13/2022 10:15 A M Medical Record Number: [id number] Patient Account Number: [id number] Date of Birth/Sex: Treating RN: 28-Jul-1976 (46 y.o. M) Primary Care Faith Patricelli: PA [name], [place] Other Clinician: Referring Roxi Hlavaty: Treating Natale Barba/Extender: [name] in Treatment: 74 Active Problems Location of Pain Severity and Description of Pain Patient Has Paino No Site  Locations Pain Management and Medication Current Pain Management: Electronic Signature(s) Signed: 07/13/2022 11:27:40 AM By: Worthy Rancher Entered By: Worthy Rancher on 07/13/2022 10:41:35 -------------------------------------------------------------------------------- Patient/Caregiver Education Details Patient Name: Date of Service: [name] 12/12/2023andnbsp10:15 A M Medical Record Number: [id number] Patient Account Number: [id number] Date of Birth/Gender: Treating RN: [birth year] (46 y.o. Collene Gobble Primary Care Physician: PA [name], [place] Other Clinician: Referring Physician: Treating Physician/Extender: [name] in Treatment: 47 Education Assessment Education Provided To: Patient [name], [name] ([id number]) [id number] Page 6 of 8 Education Topics Provided Wound/Skin Impairment: Methods: Explain/Verbal Responses: Return demonstration correctly Electronic Signature(s) Signed: 07/13/2022 5:21:34 PM By: Dellie Catholic RN Entered By: Dellie Catholic on 07/13/2022 17:16:59 -------------------------------------------------------------------------------- Wound Assessment Details Patient Name: Date of Service: [name] NA [place] 07/13/2022 10:15 A M Medical Record Number: [id number] Patient Account Number: [id number] Date of Birth/Sex: Treating RN: August 18, 1975 (46 y.o. M) Primary Care Reola Buckles: PA [name], [place] Other Clinician: Referring Juwan Vences: Treating Sheritta Deeg/Extender: [name] in Treatment: 74 Wound Status Wound Number: 1 Primary Etiology: Trauma, Other Wound Location: Left Ankle Wound Status: Open Wounding Event: Trauma Date Acquired: 10/14/2020 Weeks Of Treatment: 74 Clustered Wound: No Photos Wound Measurements Length: (cm) 1.8 Width: (cm) 2.8 Depth: (cm) 0.2 Area: (cm) 3.958 Volume: (cm) 0.792 % Reduction in Area: 93.6% % Reduction in Volume:  96.8% Epithelialization: Large (67-100%) Tunneling: No Undermining: No Wound Description Classification: Full Thickness Without Exposed Suppor Wound Margin: Flat and Intact Exudate Amount: Medium Exudate Type: Serosanguineous Exudate Color: red, brown t Structures Foul Odor After Cleansing: No Slough/Fibrino Yes Wound Bed Granulation Amount: Large (67-100%) Exposed Structure Granulation Quality: Red Fascia Exposed: No Necrotic Amount: Small (1-33%) Fat Layer (Subcutaneous Tissue) Exposed: Yes Necrotic Quality: Adherent Slough Tendon Exposed: No Muscle Exposed: No Joint Exposed: No Bone Exposed: No Periwound Skin Texture [name], [name] ([id number] Page 7 of 8 Texture Color No Abnormalities Noted: No No Abnormalities Noted: Yes Scarring: Yes Temperature / Pain Temperature: Cool/Cold Moisture No Abnormalities Noted: No Dry / Scaly: Yes Treatment Notes Wound #1 (Ankle) Wound Laterality: Left Cleanser Soap and Water Discharge Instruction: May shower and wash wound with dial antibacterial soap and water prior to dressing change. Wound Cleanser Discharge Instruction: Cleanse the wound with wound cleanser prior to applying a clean dressing using gauze sponges, not tissue or cotton balls. Peri-Wound Care Sween Lotion (Moisturizing lotion) Discharge Instruction: Apply moisturizing lotion as directed Topical Gentamicin Discharge Instruction: As directed by physician Primary Dressing Endoform 2x2 in Discharge Instruction: Moisten with saline Secondary Dressing Drawtex 4x4 in Discharge Instruction: Apply over primary dressing as directed. Zetuvit Plus 4x8 in Discharge Instruction: Apply over primary dressing as directed. Secured With Compression Wrap ThreePress (3 layer compression wrap) Discharge Instruction: Apply three layer compression as directed. Compression Stockings Compression Stocking Quantity: 1 Left Leg Compression  Amount: 30-40 mmHg Right Leg Compression Amount: 30-40 mmHg Discharge Instruction: Apply Compression Stockings daily as instructed. Apply first thing in the morning, remove at night before bed. Add-Ons Electronic Signature(s) Signed: 07/13/2022 11:27:40 AM By: Worthy Rancher Entered By: Worthy Rancher on 07/13/2022 10:51:38 --------------------------------------------------------------------------------  Vitals Details Patient Name: Date of Service: [name] [place] NIE 07/13/2022 10:15 A M Medical Record Number: [id number] Patient Account Number: [id number] Date of Birth/Sex: Treating RN: November 18, 1975 (46 y.o. M) Primary Care Galo Sayed: PA Zenovia [place], [place] Other Clinician: Referring Auryn Paige: Treating Shakita Keir/Extender: Darleen Crocker in Treatment: 74 Vital Signs Time Taken: 10:30 Temperature (F): 97.7 [name], [name] ([id number]) [phone].pdf Page 8 of 8 Height (in): 69 Pulse (bpm): 65 Weight (lbs): 170 Respiratory Rate (breaths/min): 20 Body Mass Index (BMI): 25.1 Blood Pressure (mmHg): 113/72 Reference Range: 80 - 120 mg / dl Electronic Signature(s) Signed: 07/13/2022 11:27:40 AM By: Dayton Scrape Entered By: Dayton Scrape on 07/13/2022 10:41:28

## 2022-07-14 NOTE — Progress Notes (Signed)
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 remove Non-Viable tissue/material. Material removed includes Subcutaneous Tissue and Slough and after achieving pain control using Lidocaine 5% topical ointment. No specimens were taken. A time out  was conducted at 11:08, prior to the start of the procedure. A Minimum amount of bleeding was controlled with Pressure. The procedure was tolerated well with a pain level of 0 throughout and a pain level of 0 following the procedure. Post Debridement Measurements: 1.8cm length x 2.8cm width x 0.2cm depth; 0.792cm^3 volume. Character of Wound/Ulcer Post Debridement is improved. Post procedure Diagnosis Wound #1: Same as Pre-Procedure General Notes: Scribed for Dr. Leanord Hawking By J.Scotton. Plan Follow-up Appointments: Return Appointment in 1 week. - Dr. Lady Gary - Room 2 Other: - interpreter required. Anesthetic: (In clinic) Topical Lidocaine 4% applied to wound bed Bathing/ Shower/ Hygiene: May shower with protection but do not get wound dressing(s) wet. Edema Control - Lymphedema / SCD / Other: Elevate legs to the level of the heart or above for 30 minutes daily and/or when sitting, a frequency of: - 3-4 times a day throughout the day. Avoid standing for long periods of time. Exercise regularly Off-Loading: Open toe surgical shoe to: - left foot WOUND #1: - Ankle Wound Laterality: Left [name], [name] ([id number]) [id number] Page 10 of 10 Cleanser: Soap and Water 1 x Per Week/30 Days Discharge Instructions: May shower and wash wound with dial antibacterial soap and water prior to dressing change. Cleanser: Wound Cleanser 1 x Per Week/30 Days Discharge Instructions: Cleanse the wound with wound cleanser prior to applying a clean dressing using gauze sponges, not tissue or cotton balls. Peri-Wound Care: Sween Lotion (Moisturizing lotion) 1 x Per Week/30 Days Discharge Instructions: Apply moisturizing lotion as directed Topical: Gentamicin 1 x Per Week/30 Days Discharge Instructions: As directed by physician Prim Dressing: Endoform 2x2 in 1 x Per Week/30 Days ary Discharge Instructions: Moisten with saline Secondary Dressing: Drawtex 4x4 in 1 x Per Week/30  Days Discharge Instructions: Apply over primary dressing as directed. Secondary Dressing: Zetuvit Plus 4x8 in 1 x Per Week/30 Days Discharge Instructions: Apply over primary dressing as directed. Com pression Wrap: ThreePress (3 layer compression wrap) 1 x Per Week/30 Days Discharge Instructions: Apply three layer compression as directed. Com pression Stockings: Compression Stocking (DME) Compression Amount: 30-40 mmHg (left) Compression Amount: 30-40 mmHg (right) Discharge Instructions: Apply Compression Stockings daily as instructed. Apply first thing in the morning, remove at night before bed. 1. Postdebridement his wound looks really quite good although it still has some depth. 2. I would like to continue the endoform here still under 3 layer compression. He has some drainage therefore he backs with drawtex. 3. He is not working. He will eventually need compression stockings when this heals if he does not already have them. Electronic Signature(s) Signed: 07/13/2022 3:46:52 PM By: Baltazar Najjar MD Entered By: Baltazar Najjar on 07/13/2022 11:32:10 -------------------------------------------------------------------------------- SuperBill Details Patient Name: Date of Service: [name] 07/13/2022 Medical Record Number: [id number] Patient Account Number: [id number] Date of Birth/Sex: Treating RN: [birth year] (46 y.o. 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 Chronic venous hypertension (idiopathic) with ulcer and inflammation of left lower extremity Facility Procedures : CPT4 Code: [id number] 1 Description:  1042 - DEB SUBQ TISSUE 20 SQ CM/< ICD-10 Diagnosis Description L97.328 Non-pressure chronic ulcer of left ankle with  other specified severity Modifier: Quantity: 1 Physician Procedures : CPT4 Code Description Modifier [id number] 11042 - WC PHYS SUBQ TISS 20 SQ CM ICD-10 Diagnosis Description L97.328 Non-pressure chronic ulcer of left ankle with other specified severity Quantity: 1 Electronic Signature(s) Signed: 07/13/2022 3:46:52 PM By: Baltazar Najjar MD Entered By: Baltazar Najjar on 07/13/2022 11:32:21

## 2022-07-21 ENCOUNTER — Encounter (HOSPITAL_BASED_OUTPATIENT_CLINIC_OR_DEPARTMENT_OTHER): Payer: Medicaid Other | Admitting: General Surgery

## 2022-07-21 DIAGNOSIS — M85872 Other specified disorders of bone density and structure, left ankle and foot: Secondary | ICD-10-CM | POA: Diagnosis not present

## 2022-07-21 DIAGNOSIS — I872 Venous insufficiency (chronic) (peripheral): Secondary | ICD-10-CM | POA: Diagnosis not present

## 2022-07-21 DIAGNOSIS — S91002A Unspecified open wound, left ankle, initial encounter: Secondary | ICD-10-CM | POA: Diagnosis not present

## 2022-07-21 DIAGNOSIS — L97328 Non-pressure chronic ulcer of left ankle with other specified severity: Secondary | ICD-10-CM | POA: Diagnosis not present

## 2022-07-21 DIAGNOSIS — L97529 Non-pressure chronic ulcer of other part of left foot with unspecified severity: Secondary | ICD-10-CM | POA: Diagnosis not present

## 2022-07-21 NOTE — Progress Notes (Addendum)
[name], [name] ([id number]) [id number] Page 1 of 8 Visit Report for 07/21/2022 Arrival Information Details Patient Name: Date of Service: [name] [place] NIE 07/21/2022 8:30 [name] M Medical Record Number: [id number] Patient Account Number: [id number] Date of Birth/Sex: Treating RN: 05/31/1976 (46 y.o. M) Primary Care [name]: PA [name], NO Other Clinician: Referring [name]: Treating [name]/Extender: [name] in Treatment: 45 Visit Information History Since Last Visit All ordered tests and consults were completed: No Patient Arrived: Ambulatory Added or deleted any medications: No Arrival Time: 08:31 Any new allergies or adverse reactions: No Accompanied By: self Had [name] fall or experienced change in No Transfer Assistance: None activities of daily living that may affect Patient Identification Verified: Yes risk of falls: Secondary Verification Process Completed: Yes Signs or symptoms of abuse/neglect since last visito No Patient Requires Transmission-Based Precautions: No Hospitalized since last visit: No Patient Has Alerts: No Implantable device outside of the clinic excluding No cellular tissue based products placed in the center since last visit: Pain Present Now: No Electronic Signature(s) Signed: 07/21/2022 9:49:06 AM By: [name] Entered By: [name] on 07/21/2022 08:32:03 -------------------------------------------------------------------------------- Complex / Palliative Patient Assessment Details Patient Name: Date of Service: [name] 07/21/2022 8:30 [name] M Medical Record Number: [id number] Patient Account Number: [id number] Date of Birth/Sex: Treating RN: 1975/12/06 (46 y.o. [name] Primary Care [name]: PA [name] Other Clinician: Referring [name]: Treating [name]/Extender: [name] in Treatment: 75 Complex Wound Management Criteria Patient  is unable to adhere to [name] Advanced Wound Management Plan. Agreem to Receive Wound Care has been implemented. Patient demonstrates continued inability to adhere to [name] Advanced Wound Management Plan, ent the patient has been presented with the option of continuing the Advanced Plan or implementing the Complex Wound Management Plan. Patient or healthcare surrogate must agree to Complex Wound Management Plan. Palliative Wound Management Criteria Care Approach Wound Care Plan: Complex Wound Management Notes Patient continues to missed follow up appts along with showing up to the office wanting to be seen without [name] appt or [name] interpreter. Electronic Signature(s) Signed: 07/30/2022 8:44:44 PM By: [name] Stall RN, BSN Signed: 08/09/2022 2:25:03 PM By: [name] FACS Entered By: [name] on 07/30/2022 20:44:44 [name] ([id number]) [id number] Page 2 of 8 -------------------------------------------------------------------------------- Compression Therapy Details Patient Name: Date of Service: [name] 07/21/2022 8:30 [name] M Medical Record Number: [id number] Patient Account Number: [id number] Date of Birth/Sex: Treating RN: Mar 26, 1976 (46 y.o. [name] Primary Care [name]: PA [name], NO Other Clinician: Referring [name]: Treating [name]/Extender: [name] in Treatment: 7 Compression Therapy Performed for Wound Assessment: Wound #1 Left Ankle Performed By: Clinician [name] Bruin, RN Compression Type: Three Layer Post Procedure Diagnosis Same as Pre-procedure Electronic Signature(s) Signed: 07/21/2022 4:38:38 PM By: [name] By: [name] on 07/21/2022 08:45:28 -------------------------------------------------------------------------------- Encounter Discharge Information Details Patient Name: Date of Service: [name] 07/21/2022 8:30 [name]  M Medical Record Number: [id number] Patient Account Number: [id number] Date of Birth/Sex: Treating RN: Dec 08, 1975 (46 y.o. [name] Primary Care Jakwan Sally: PA [name], NO Other Clinician: Referring [name]: Treating [name]/Extender: [name] in Treatment: 58 Encounter Discharge Information Items Post Procedure Vitals Discharge Condition: Stable Temperature (F): 97.4 Ambulatory Status: Ambulatory Pulse (bpm): 62 Discharge Destination: Home Respiratory Rate (breaths/min): 20 Transportation: Private Auto Blood Pressure (mmHg): 120/77 Accompanied By: translator Schedule Follow-up Appointment: Yes Clinical Summary of Care: Patient Declined Electronic Signature(s)  Signed: 07/21/2022 4:38:38 PM By: [name] By: [name] on 07/21/2022 09:21:35 -------------------------------------------------------------------------------- Lower Extremity Assessment Details Patient Name: Date of Service: [name] 07/21/2022 8:30 [name] M Medical Record Number: [id number] Patient Account Number: [id number] Date of Birth/Sex: Treating RN: 05/16/1976 (46 y.o. [name] Primary Care [name]: PA [name], [place] Other Clinician: Referring [name]: Treating [name]/Extender: [name] in Treatment: [address] [name], [name] ([id number]) [id number] Page 3 of 8 Edema Assessment Assessed: [Left: No] [Right: No] Edema: [Left: Ye] [Right: s] Calf Left: Right: Point of Measurement: 28 cm From Medial Instep 29.5 cm 19.5 cm Ankle Left: Right: Point of Measurement: 8 cm From Medial Instep 21 cm 21.5 cm Vascular Assessment Pulses: Dorsalis Pedis Palpable: [Left:Yes] Electronic Signature(s) Signed: 07/21/2022 4:38:38 PM By: [name] Entered By: [name] on 07/21/2022  08:41:25 -------------------------------------------------------------------------------- Multi Wound Chart Details Patient Name: Date of Service: [name] 07/21/2022 8:30 [name] M Medical Record Number: [id number] Patient Account Number: [id number] Date of Birth/Sex: Treating RN: 1976/05/28 (46 y.o. M) Primary Care Elliet Goodnow: PA [name], NO Other Clinician: Referring Blenda Wisecup: Treating Annesha Delgreco/Extender: [name] in Treatment: 75 Vital Signs Height(in): 69 Pulse(bpm): 62 Weight(lbs): 170 Blood Pressure(mmHg): 120/77 Body Mass Index(BMI): 25.1 Temperature(F): 97.4 Respiratory Rate(breaths/min): 20 [1:Photos:] [[name]:[name]] Left Ankle [name] [name] Wound Location: Trauma [name] [name] Wounding Event: Trauma, Other [name] [name] Primary Etiology: 10/14/2020 [name] [name] Date Acquired: 80 [name] [name] Weeks of Treatment: Open [name] [name] Wound Status: No [name] [name] Wound Recurrence: 1x2x0.2 [name] [name] Measurements L x W x D (cm) 1.571 [name] [name] [name] (cm) : rea 0.314 [name] [name] Volume (cm) : 97.40% [name] [name] % Reduction in [name] rea: 98.70% [name] [name] % Reduction in Volume: Full Thickness Without Exposed [name] [name] Classification: Support Structures Medium [name] [name] Exudate Amount: Serosanguineous [name] [name] Exudate Type: Alejandro, [name] ([id number]) [id number] Page 4 of 8 red, brown [name] [name] Exudate Color: Flat and Intact [name] [name] Wound Margin: Large (67-100%) [name] [name] Granulation Amount: Red [name] [name] Granulation Quality: Small (1-33%) [name] [name] Necrotic Amount: Fat Layer (Subcutaneous Tissue): Yes [name] [name] Exposed Structures: Fascia: No Tendon: No Muscle: No Joint: No Bone: No Large (67-100%) [name] [name] Epithelialization: Debridement - Excisional [name] [name] Debridement: Pre-procedure Verification/Time Out 08:44 [name] [name] Taken: Lidocaine 4% Topical Solution [name] [name] Pain Control: Necrotic/Eschar, Subcutaneous, [name] [name] Tissue  Debrided: Slough Skin/Subcutaneous Tissue [name] [name] Level: 2 [name] [name] Debridement [name] (sq cm): rea Curette [name] [name] Instrument: Minimum [name] [name] Bleeding: Pressure [name] [name] Hemostasis Achieved: Debridement Treatment Response: Procedure was tolerated well [name] [name] Post Debridement Measurements L x 1x2x0.2 [name] [name] W x D (cm) 0.314 [name] [name] Post Debridement Volume: (cm) Scarring: Yes [name] [name] Periwound Skin Texture: Dry/Scaly: Yes [name] [name] Periwound Skin Moisture: No Abnormalities Noted [name] [name] Periwound Skin Color: Cool/Cold [name] [name] Temperature: Compression Therapy [name] [name] Procedures Performed: Debridement Treatment Notes Wound #1 (Ankle) Wound Laterality: Left Cleanser Soap and Water Discharge Instruction: May shower and wash wound with dial antibacterial soap and water prior to dressing change. Wound Cleanser Discharge Instruction: Cleanse the wound with wound cleanser prior to applying [name] clean dressing using gauze sponges, not tissue or cotton balls. Peri-Wound Care Sween Lotion (Moisturizing lotion) Discharge Instruction: Apply moisturizing lotion as directed Topical Gentamicin Discharge Instruction: As directed by physician Primary Dressing Endoform 2x2 in Discharge Instruction: Moisten with saline Secondary Dressing Drawtex 4x4 in Discharge Instruction: Apply over primary dressing as directed. Zetuvit Plus 4x8 in Discharge Instruction:  Apply over primary dressing as directed. Secured With Compression Wrap ThreePress (3 layer compression wrap) Discharge Instruction: Apply three layer compression as directed. Compression Stockings Compression Stocking Quantity: 1 Left Leg Compression Amount: 30-40 mmHg Right Leg Compression Amount: 30-40 mmHg Discharge Instruction: Apply Compression Stockings daily as instructed. Apply first thing in the morning, remove at night before bed. Add-Ons Bevens, [name] ([id number]) [id number] Page 5 of  8 Electronic Signature(s) Signed: 07/21/2022 8:59:58 AM By: [name] FACS Entered By: Duanne [name] on 07/21/2022 08:59:58 -------------------------------------------------------------------------------- Multi-Disciplinary Care Plan Details Patient Name: Date of Service: [name] 07/21/2022 8:30 [name] M Medical Record Number: [id number] Patient Account Number: [id number] Date of Birth/Sex: Treating RN: 09/08/1975 (46 y.o. [name] Primary Care Colbi Schiltz: PA [name], NO Other Clinician: Referring Maysa Lynn: Treating Trevel Dillenbeck/Extender: [name] in Treatment: 24 Multidisciplinary Care Plan reviewed with physician Active Inactive Venous Leg Ulcer Nursing Diagnoses: Actual venous Insuffiency (use after diagnosis is confirmed) Knowledge deficit related to disease process and management Goals: Patient will maintain optimal edema control Date Initiated: 02/19/2022 Target Resolution Date: 10/30/2022 Goal Status: Active Interventions: Assess peripheral edema status every visit. Compression as ordered Treatment Activities: Therapeutic compression applied : 02/19/2022 Notes: Wound/Skin Impairment Nursing Diagnoses: Knowledge deficit related to ulceration/compromised skin integrity Goals: Patient/caregiver will verbalize understanding of skin care regimen Date Initiated: 02/05/2021 Target Resolution Date: 10/30/2022 Goal Status: Active Interventions: Assess patient/caregiver ability to obtain necessary supplies Assess patient/caregiver ability to perform ulcer/skin care regimen upon admission and as needed Provide education on ulcer and skin care Treatment Activities: Skin care regimen initiated : 02/05/2021 Topical wound management initiated : 02/05/2021 Notes: 03/31/21: Wound care regimen ongoing, target date extended. 04/21/21: Wound care ongoing, through interpreter patient states he is doing fine with his  dressing changes. Electronic Signature(s) Signed: 07/21/2022 4:38:38 PM By: [name] Entered By: [name] on 07/21/2022 08:43:32 [name], [name] ([id number]) [id number] Page 6 of 8 -------------------------------------------------------------------------------- Pain Assessment Details Patient Name: Date of Service: [name] 07/21/2022 8:30 [name] M Medical Record Number: [id number] Patient Account Number: [id number] Date of Birth/Sex: Treating RN: [birth year] (46 y.o. M) Primary Care Wayburn Shaler: PA [name], NO Other Clinician: Referring Amberlee Garvey: Treating Javonta Gronau/Extender: [name] in Treatment: 60 Active Problems Location of Pain Severity and Description of Pain Patient Has Paino No Site Locations Pain Management and Medication Current Pain Management: Electronic Signature(s) Signed: 07/21/2022 9:49:06 AM By: [name] Entered By: [name] on 07/21/2022 08:32:32 -------------------------------------------------------------------------------- Patient/Caregiver Education Details Patient Name: Date of Service: [name] 12/20/2023andnbsp8:30 [name] M Medical Record Number: [id number] Patient Account Number: [id number] Date of Birth/Gender: Treating RN: [birth year] (46 y.o. [name] Primary Care Physician: PA [name], [place] Other Clinician: Referring Physician: Treating Physician/Extender: [name] in Treatment: 6 Education Assessment Education Provided To: Patient Education Topics Provided Wound/Skin Impairment: Methods: Explain/Verbal Responses: Reinforcements needed, State content correctly [name], [name] ([id number]) [id number].pdf Page 7 of 8 Electronic Signature(s) Signed: 07/21/2022 4:38:38 PM By: [name] By: [name] on 07/21/2022  08:43:44 -------------------------------------------------------------------------------- Wound Assessment Details Patient Name: Date of Service: [name] 07/21/2022 8:30 [name] M Medical Record Number: [id number] Patient Account Number: [id number] Date of Birth/Sex: Treating RN: 29-Sep-1975 (46 y.o. M) Primary Care Azaela Caracci: PA [name], NO Other Clinician: Referring Matsuko Kretz: Treating Olar Santini/Extender: [name] in Treatment: 75 Wound Status Wound Number: 1 Primary Etiology: Trauma, Other Wound Location: Left Ankle Wound Status: Open Wounding Event: Trauma  Date Acquired: 10/14/2020 Weeks Of Treatment: 75 Clustered Wound: No Photos Wound Measurements Length: (cm) 1 Width: (cm) 2 Depth: (cm) 0.2 Area: (cm) 1.571 Volume: (cm) 0.314 % Reduction in Area: 97.4% % Reduction in Volume: 98.7% Epithelialization: Large (67-100%) Tunneling: No Undermining: No Wound Description Classification: Full Thickness Without Exposed Suppor Wound Margin: Flat and Intact Exudate Amount: Medium Exudate Type: Serosanguineous Exudate Color: red, brown t Structures Foul Odor After Cleansing: No Slough/Fibrino Yes Wound Bed Granulation Amount: Large (67-100%) Exposed Structure Granulation Quality: Red Fascia Exposed: No Necrotic Amount: Small (1-33%) Fat Layer (Subcutaneous Tissue) Exposed: Yes Necrotic Quality: Adherent Slough Tendon Exposed: No Muscle Exposed: No Joint Exposed: No Bone Exposed: No Periwound Skin Texture Texture Color No Abnormalities Noted: No No Abnormalities Noted: Yes Scarring: Yes Temperature / Pain Temperature: Cool/Cold Moisture No Abnormalities Noted: No [name], [name] ([id number]) [id number] Page 8 of 8 Dry / Scaly: Yes Electronic Signature(s) Signed: 07/21/2022 4:38:38 PM By: [name] BruinHerrington, Taylor Entered By: [name] BruinHerrington, Taylor on 07/21/2022  08:41:56 -------------------------------------------------------------------------------- Vitals Details Patient Name: Date of Service: [name], [name] [name] 07/21/2022 8:30 [name] M Medical Record Number: [id number] Patient Account Number: [id number] Date of Birth/Sex: Treating RN: 03/12/1976 (46 y.o. M) Primary Care Musab Wingard: PA [name] JordanIENT, NO Other Clinician: Referring Linzie Boursiquot: Treating Midas Daughety/Extender: [name] Marshallannon, Jennifer [name], [name] Weeks in Treatment: 75 Vital Signs Time Taken: 08:30 Temperature (F): 97.4 Height (in): 69 Pulse (bpm): 62 Weight (lbs): 170 Respiratory Rate (breaths/min): 20 Body Mass Index (BMI): 25.1 Blood Pressure (mmHg): 120/77 Reference Range: 80 - 120 mg / dl Electronic Signature(s) Signed: 07/21/2022 9:49:06 AM By: [name] Scrapeoss, Aisha Entered By: [name] Scrapeoss, Aisha on 07/21/2022 08:32:25

## 2022-07-21 NOTE — Progress Notes (Signed)
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 the surface, but good granulation tissue underlying. Integumentary (Hair, Skin) Wound #1 status is Open. Original cause of wound was Trauma. The date acquired was: 10/14/2020. The wound has been in treatment 75 weeks. The  wound is located on the Left Ankle. The wound measures 1cm length x 2cm width x 0.2cm depth; 1.571cm^2 area and 0.314cm^3 volume. There is Fat  Layer (Subcutaneous Tissue) exposed. There is no tunneling or undermining noted. There is a medium amount of serosanguineous drainage noted. The wound margin is flat and intact. There is large (67-100%) red granulation within the wound bed. There is a small (1-33%) amount of necrotic tissue within the wound bed including Adherent Slough. The periwound skin appearance had no abnormalities noted for color. The periwound skin appearance exhibited: Scarring, Dry/Scaly. Periwound temperature was noted as Cool/Cold. 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 There was a Excisional Skin/Subcutaneous Tissue Debridement with a total area of 2 sq cm performed by Duanne Guess, MD. With the following instrument(s): Curette to remove Non-Viable tissue/material. Material removed includes Eschar, Subcutaneous Tissue, and Slough after achieving pain control using Lidocaine 4% T opical Solution. No specimens were taken. A time out was conducted at 08:44, prior to the start of the procedure. A Minimum amount of bleeding was controlled with Pressure. The procedure was tolerated well. Post Debridement Measurements: 1cm length x 2cm width x 0.2cm depth; 0.314cm^3 volume. Character of Wound/Ulcer Post Debridement is improved. Post procedure Diagnosis Wound #1: Same as Pre-Procedure General Notes: scribed for Dr. Lady Gary by Samuella Bruin, RN. Pre-procedure diagnosis of Wound #1 is a Trauma, Other located on the Left Ankle . There was a Three Layer Compression Therapy Procedure by Samuella Bruin, RN. Post procedure Diagnosis Wound #1: Same as Pre-Procedure Plan Follow-up Appointments: Return Appointment in 1 week. - Dr. Lady Gary - Room 2 Other: -  interpreter required. Anesthetic: (In clinic) Topical Lidocaine 4% applied to wound bed Bathing/ Shower/ Hygiene: May shower with protection but do not get wound dressing(s) wet. Edema Control - Lymphedema / SCD / Other: Elevate legs to the level of the heart or above for 30 minutes daily and/or when sitting, a frequency of: - 3-4 times a day throughout the day. Avoid standing for long periods of time. Exercise regularly Off-Loading: Open toe surgical shoe to: - left foot The following medication(s) was prescribed: lidocaine topical 4 % cream cream topical was prescribed at facility WOUND #1: - Ankle Wound Laterality: Left Cleanser: Soap and Water 1 x Per Week/30 Days Discharge Instructions: May shower and wash wound with dial antibacterial soap and water prior to dressing change. Cleanser: Wound Cleanser 1 x Per Week/30 Days Discharge Instructions: Cleanse the wound with wound cleanser prior to applying a clean dressing using gauze sponges, not tissue or cotton balls. Peri-Wound Care: Sween Lotion (Moisturizing lotion) 1 x Per Week/30 Days Discharge Instructions: Apply moisturizing lotion as directed Topical: Gentamicin 1 x Per Week/30 Days Discharge Instructions: As directed by physician Prim Dressing: Endoform 2x2 in 1 x Per Week/30 Days ary Discharge Instructions: Moisten with saline Secondary Dressing: Drawtex 4x4 in 1 x Per Week/30 Days Passow, [name] ([id number]) [id number] Page 11 of 12 Discharge Instructions: Apply over primary dressing as directed. Secondary Dressing: Zetuvit Plus 4x8 in 1 x Per Week/30 Days Discharge Instructions: Apply over primary dressing as directed. Compression Wrap: ThreePress (3 layer compression wrap) 1 x Per Week/30 Days Discharge Instructions: Apply three layer compression as directed. Compression Stockings: Compression Stocking Compression Amount: 30-40 mmHg (left) Compression Amount: 30-40 mmHg  (  right) Discharge Instructions: Apply Compression Stockings daily as instructed. Apply first thing in the morning, remove at night before bed. 07/21/2022: The wound is smaller today. There is slough on the surface, but good granulation tissue underlying. I used a curette to debride slough, eschar, and nonviable subcutaneous tissue from the wound. We will continue topical gentamicin with endoform and 3 layer compression. Follow-up in 1 week. Electronic Signature(s) Signed: 07/21/2022 9:03:05 AM By: [name] FACS Entered By: Duanne Guess on 07/21/2022 09:03:05 -------------------------------------------------------------------------------- HxROS Details Patient Name: Date of Service: [name], [name] 07/21/2022 8:30 A M Medical Record Number: [id number] Patient Account Number: [id number] Date of Birth/Sex: Treating RN: 1976/05/14 (46 y.o. M) Primary Care Provider: PA Zenovia Jordan, NO Other Clinician: Referring Provider: Treating Provider/Extender: Horton Marshall in Treatment: 39 Information Obtained From Patient Gastrointestinal Medical History: Past Medical History Notes: Chronic Gastritis Immunizations Pneumococcal Vaccine: Received Pneumococcal Vaccination: No Implantable Devices No devices added Family and Social History Unknown History: Yes; Current every day smoker; Marital Status - Married; Alcohol Use: Rarely; Drug Use: No History; Caffeine Use: Moderate; Financial Concerns: No; Food, Clothing or Shelter Needs: No; Support System Lacking: No; Transportation Concerns: No Electronic Signature(s) Signed: 07/21/2022 9:12:08 AM By: [name] FACS Entered By: Duanne Guess on 07/21/2022 09:01:03 -------------------------------------------------------------------------------- SuperBill Details Patient Name: Date of Service: [name] 07/21/2022 Cotrell, [name] ([id number]) [id number]  Page 12 of 12 Medical Record Number: [id number] Patient Account Number: [id number] Date of Birth/Sex: Treating RN: [birth year] (46 y.o. M) Primary Care Provider: PA TIENT, NO Other Clinician: Referring Provider: Treating Provider/Extender: Horton Marshall in Treatment: 75 Diagnosis Coding ICD-10 Codes Code Description L97.328 Non-pressure chronic ulcer of left ankle with other specified severity I87.332 Chronic venous hypertension (idiopathic) with ulcer and inflammation of left lower extremity Facility Procedures : CPT4 Code: 08657846 Description: 11042 - DEB SUBQ TISSUE 20 SQ CM/< ICD-10 Diagnosis Description L97.328 Non-pressure chronic ulcer of left ankle with other specified severity Modifier: Quantity: 1 Physician Procedures : CPT4 Code Description Modifier [id number] 99214 - WC PHYS LEVEL 4 - EST PT 25 ICD-10 Diagnosis Description L97.328 Non-pressure chronic ulcer of left ankle with other specified severity I87.332 Chronic venous hypertension (idiopathic) with ulcer and  inflammation of left lower extremity Quantity: 1 : [id number] 11042 - WC PHYS SUBQ TISS 20 SQ CM ICD-10 Diagnosis Description L97.328 Non-pressure chronic ulcer of left ankle with other specified severity Quantity: 1 Electronic Signature(s) Signed: 07/21/2022 9:03:17 AM By: [name] FACS Entered By: Duanne Guess on 07/21/2022 09:03:17

## 2022-07-27 ENCOUNTER — Other Ambulatory Visit: Payer: Medicaid Other | Admitting: *Deleted

## 2022-07-27 ENCOUNTER — Encounter (HOSPITAL_BASED_OUTPATIENT_CLINIC_OR_DEPARTMENT_OTHER): Payer: Medicaid Other | Admitting: General Surgery

## 2022-07-27 ENCOUNTER — Encounter: Payer: Self-pay | Admitting: *Deleted

## 2022-07-27 NOTE — Patient Outreach (Signed)
Medicaid Managed Care   Nurse Care Manager Note  07/27/2022 Name:  [name] MRN:  [id number] DOB:  Jan 07, 1976  [name] is an 46 y.o. year old male who is a primary [name] of [name], No Pcp Per.  The [facility] Managed Care Coordination team was consulted for assistance with:    Wound Care  Call made while utilizing Swahili interpreter # [phone], via [employer].  Mr. Moscato was given information about Medicaid Managed Care Coordination team services today. [place] [name] agreed to services and verbal consent obtained.  Engaged with [name] by telephone for initial visit in response to provider referral for case management and/or care coordination services.   Assessments/Interventions:  Review of past medical history, allergies, medications, health status, including review of consultants reports, laboratory and other test data, was performed as part of comprehensive evaluation and provision of chronic care management services.  SDOH (Social Determinants of Health) assessments and interventions performed: SDOH Interventions    Flowsheet Row [name] Outreach Telephone from 07/27/2022 in [place] POPULATION HEALTH DEPARTMENT  SDOH Interventions   Food Insecurity Interventions Intervention Not Indicated  Housing Interventions Other (Comment)  [BSW referral, scheduled on 07/28/22 at 3pm]  Transportation Interventions Intervention Not Indicated       Care Plan  No Known Allergies  Medications Reviewed Today     Reviewed by Heidi Dach, RN (Registered Nurse) on 07/27/22 at 1438  Med List Status: <None>   Medication Order Taking? Sig Documenting Provider Last Dose Status Informant  omeprazole (PRILOSEC) 40 MG capsule [id number]  Take 1 capsule (40 mg total) by mouth daily. 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   Chronic gastritis  04/09/2021   Refugee health examination 04/07/2021   Unable to read or write in [place] or English  04/07/2021   Tobacco abuse 04/07/2021   Hematuria 04/07/2021   Medication monitoring encounter 03/18/2021   Skin ulcer (HCC) 02/17/2021    Conditions to be addressed/monitored per PCP order:   Wound Care  Care Plan : RN Care Manager Plan of Care  Updates made by Heidi Dach, RN since 07/27/2022 12:00 AM     Problem: Health Management needs related to Wound Care      Long-Range Goal: Development of Plan of Care to address Health Management needs related to Wound Care   Start Date: 07/27/2022  Expected End Date: 10/25/2022  Priority: High  Note:   Current Barriers:  Knowledge Deficits related to plan of care for management of Wound Management  Financial Constraints. [name]'s main concern today finances. He has been unable to work and getting behind on the bills. He would like a letter stating that it is ok for him to return to work. Currently receives $800/month in food benefits and no help for rent/utilities, which is $1391.  RNCM Clinical Goal(s):  [name] will verbalize understanding of plan for management of Wound Care as evidenced by [name] reports attend all scheduled medical appointments: reschedule missed visit with Wound Care as evidenced by provider EMR documentation        work with social worker to address Financial constraints related to housing related to the management of Wound Care as evidenced by review of EMR and [name] or social worker report       Interventions: Inter-disciplinary care team collaboration (see longitudinal plan of care) Evaluation  of current treatment plan related to  self management and [name]'s adherence to plan as established by provider   Wound Care  (Status: New goal.) Long Term Goal  Evaluation of current treatment plan related to  Wound Care , Financial constraints related to affording housing  self-management and [name]'s  adherence to plan as established by provider. Discussed plans with [name] for ongoing care management follow up and provided [name] with direct contact information for care management team Social Work referral for assistance with financial resources, telephone outreach scheduled on 07/28/22; Assessed social determinant of health barriers;  Discussed the importance of having a PCP Utilized Swahili interpreter during this entire visit RNCM will follow up over the next 14 days to assist with establishing care with a PCP  [name] Goals/Self-Care Activities: Attend all scheduled provider appointments Work with the social worker to address care coordination needs and will continue to work with the clinical team to address health care and disease management related needs       Follow Up:  [name] agrees to Care Plan and Follow-up.  Plan: The Managed Medicaid care management team will reach out to the [name] again over the next 14 days.  Date/time of next scheduled RN care management/care coordination outreach:  08/05/22 @ 11:30am  [name] RN, BSN [place]  [facility]

## 2022-07-27 NOTE — Patient Instructions (Signed)
Visit Information  [name] was given information about Medicaid Managed Care team care coordination services as a part of their [facility] Medicaid benefit. [name] verbally consented to engagement with the [facility] Managed Care team.   If you are experiencing a medical emergency, please call 911 or report to your local emergency department or urgent care.   If you have a non-emergency medical problem during routine business hours, please contact your provider's office and ask to speak with a nurse.   For questions related to your [facility] health plan, please call: [phone] or go here:[email or web address]  If you would like to schedule transportation through your [facility] plan, please call the following number at least 2 days in advance of your appointment: [phone].  You can also use the MTM portal or MTM mobile app to manage your rides. For the portal, please go to [email or web address]  Call the [facility] Crisis Line at [phone], at any time, 24 hours a day, 7 days a week. If you are in danger or need immediate medical attention call 911.  If you would like help to quit smoking, call 1-800-QUIT-NOW (660 441 1807) OR Espaol: 1-855-Djelo-Ya (7-564-332-9518) o para ms informacin haga clic aqu or Text READY to 841-660 to register via text  [name],   Please see education materials related to wound care provided as print materials.   The patient verbalized understanding of instructions, educational materials, and care plan provided today and agreed to receive a mailed copy of patient instructions, educational materials, and care plan.   Telephone follow up appointment with Managed Medicaid care management team member scheduled for:08/05/22 @ 11:30am  [name] Emms RN, BSN [place]  [facility] RN Care Coordinator   Following is a copy of your plan of care:  Care Plan : RN Care Manager Plan of Care   Updates made by [name] Dach, RN since 07/27/2022 12:00 AM     Problem: Health Management needs related to Wound Care      Long-Range Goal: Development of Plan of Care to address Health Management needs related to Wound Care   Start Date: 07/27/2022  Expected End Date: 10/25/2022  Priority: High  Note:   Current Barriers:  Knowledge Deficits related to plan of care for management of Wound Management  Financial Constraints. Patient's main concern today finances. He has been unable to work and getting behind on the bills. He would like a letter stating that it is ok for him to return to work. Currently receives $800/month in food benefits and no help for rent/utilities, which is $1391.  RNCM Clinical Goal(s):  Patient will verbalize understanding of plan for management of Wound Care as evidenced by patient reports attend all scheduled medical appointments: reschedule missed visit with Wound Care as evidenced by provider EMR documentation        work with Child psychotherapist to address Financial constraints related to housing related to the management of Wound Care as evidenced by review of EMR and patient or social worker report       Interventions: Inter-disciplinary care team collaboration (see longitudinal plan of care) Evaluation of current treatment plan related to  self management and patient's adherence to plan as established by provider   Wound Care  (Status: New goal.) Long Term Goal  Evaluation of current treatment plan related to  Wound Care , Financial constraints related to affording housing  self-management and patient's adherence to plan as established by provider. Discussed plans with patient for ongoing care management  follow up and provided patient with direct contact information for care management team Social Work referral for assistance with financial resources, telephone outreach scheduled on 07/28/22; Assessed social determinant of health barriers;  Discussed the  importance of having a PCP Utilized Swahili interpreter during this entire visit RNCM will follow up over the next 14 days to assist with establishing care with a PCP  Patient Goals/Self-Care Activities: Attend all scheduled provider appointments Work with the social worker to address care coordination needs and will continue to work with the clinical team to address health care and disease management related needs

## 2022-07-28 ENCOUNTER — Ambulatory Visit: Payer: Medicaid Other

## 2022-07-29 ENCOUNTER — Other Ambulatory Visit: Payer: Medicaid Other

## 2022-07-29 NOTE — Patient Instructions (Signed)
Visit Information  [name] was given information about Medicaid Managed Care team care coordination services as a part of their [facility] Medicaid benefit. [name] verbally consented to engagement with the [facility] Managed Care team.   If you are experiencing a medical emergency, please call 911 or report to your local emergency department or urgent care.   If you have a non-emergency medical problem during routine business hours, please contact your provider's office and ask to speak with a nurse.   For questions related to your [facility] health plan, please call: [phone] or go here:[email or web address]  If you would like to schedule transportation through your [facility] plan, please call the following number at least 2 days in advance of your appointment: [phone].  You can also use the MTM portal or MTM mobile app to manage your rides. For the portal, please go to [email or web address]  Call the [facility] Crisis Line at [phone], at any time, 24 hours a day, 7 days a week. If you are in danger or need immediate medical attention call 911.  If you would like help to quit smoking, call 1-800-QUIT-NOW (765-211-4902) OR Espaol: 1-855-Djelo-Ya (3-151-761-6073) o para ms informacin haga clic aqu or Text READY to 710-626 to register via text  Mr. At[place]ah - following are the goals we discussed in your visit today:   Goals Addressed   None      Social Worker will follow up in 30 days .   [name], BSW, [place] [facility]  [place]  High Risk Managed Medicaid Team  [phone]   Following is a copy of your plan of care:  Care Plan : RN Care Manager Plan of Care  Updates made by Shaune Leeks since 07/29/2022 12:00 AM     Problem: Health Management needs related to Wound Care      Long-Range Goal: Development of Plan of Care to address Health Management needs related to Wound Care   Start  Date: 07/27/2022  Expected End Date: 10/25/2022  Priority: High  Note:   Current Barriers:  Knowledge Deficits related to plan of care for management of Wound Management  Financial Constraints. Patient's main concern today finances. He has been unable to work and getting behind on the bills. He would like a letter stating that it is ok for him to return to work. Currently receives $800/month in food benefits and no help for rent/utilities, which is $1391.  RNCM Clinical Goal(s):  Patient will verbalize understanding of plan for management of Wound Care as evidenced by patient reports attend all scheduled medical appointments: reschedule missed visit with Wound Care as evidenced by provider EMR documentation        work with social worker to address Financial constraints related to housing related to the management of Wound Care as evidenced by review of EMR and patient or social worker report       Interventions: Inter-disciplinary care team collaboration (see longitudinal plan of care) Evaluation of current treatment plan related to  self management and patient's adherence to plan as established by provider BSW completed a telephone outreach with patient. He states his wife is the only one working, he is unable to work due to his injury. They currently receive 800 in foodstamps, they do not last due to his children needing certain foods. BSW will send patient resources for food, rent, and utilities.    Wound Care  (Status: New goal.) Long Term Goal  Evaluation of current treatment plan related  to  Wound Care , Financial constraints related to affording housing  self-management and patient's adherence to plan as established by provider. Discussed plans with patient for ongoing care management follow up and provided patient with direct contact information for care management team Social Work referral for assistance with financial resources, telephone outreach scheduled on 07/28/22; Assessed  social determinant of health barriers;  Discussed the importance of having a PCP Utilized Swahili interpreter during this entire visit RNCM will follow up over the next 14 days to assist with establishing care with a PCP  Patient Goals/Self-Care Activities: Attend all scheduled provider appointments Work with the social worker to address care coordination needs and will continue to work with the clinical team to address health care and disease management related needs

## 2022-07-29 NOTE — Patient Outreach (Addendum)
Medicaid Managed Care Social Work Note  07/29/2022 Name:  [name] MRN:  [id number] DOB:  Jun 14, 1976  [name] is an 46 y.o. year old male who is a primary patient of Patient, No Pcp Per.  The [facility] Managed Care Coordination team was consulted for assistance with:  Community Resources   [name] was given information about Medicaid Managed Care Coordination team services today. Misty Dame Patient agreed to services and verbal consent obtained.  Engaged with patient  for by telephone forinitial visit in response to referral for case management and/or care coordination services.   Assessments/Interventions:  Review of past medical history, allergies, medications, health status, including review of consultants reports, laboratory and other test data, was performed as part of comprehensive evaluation and provision of chronic care management services.  SDOH: (Social Determinant of Health) assessments and interventions performed: SDOH Interventions    Flowsheet Row Patient Outreach Telephone from 07/27/2022 in [place] Interventions Intervention Not Indicated  Housing Interventions Other (Comment)  [BSW referral, scheduled on 07/28/22 at 3pm]  Transportation Interventions Intervention Not Indicated     BSW completed a telephone outreach with patient. He states his wife is the only one working, he is unable to work due to his injury. They currently receive 800 in foodstamps, they do not last due to his children needing certain foods. Patient states they are also in need of resources for rent and utilities. Patient stated he does not have anyone that can read in [place]. BSW provided resources over the phone with interpreter.   Advanced Directives Status:  Not addressed in this encounter.  Care Plan                 No Known Allergies  Medications Reviewed Today     Reviewed by [name], RN (Registered Nurse) on 07/27/22 at 1438  Med List Status: <None>   Medication Order Taking? Sig Documenting Provider Last Dose Status Informant  omeprazole (PRILOSEC) 40 MG capsule [id number]  Take 1 capsule (40 mg total) by mouth daily. 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   Chronic gastritis 04/09/2021   Refugee health examination 04/07/2021   Unable to read or write in [place] or [place]  04/07/2021   Tobacco abuse 04/07/2021   Hematuria 04/07/2021   Medication monitoring encounter 03/18/2021   Skin ulcer ([place]) 02/17/2021    Conditions to be addressed/monitored per PCP order:   community resources  Care Plan : [place] of Care  Updates made by Ethelda Chick since 07/29/2022 12:00 AM     Problem: Health Management needs related to Wound Care      Long-Range Goal: Development of Plan of Care to address Health Management needs related to Wound Care   Start Date: 07/27/2022  Expected End Date: 10/25/2022  Priority: High  Note:   Current Barriers:  Knowledge Deficits related to plan of care for management of Wound Management  Financial Constraints. Patient's main concern today finances. He has been unable to work and getting behind on the bills. He would like a letter stating that it is ok for him to return to work. Currently receives $800/month in food benefits and no help for rent/utilities, which is $1391.  RNCM Clinical Goal(s):  Patient will verbalize understanding  of plan for management of Wound Care as evidenced by patient reports attend all scheduled medical appointments: reschedule missed visit with Wound Care as evidenced by provider EMR documentation        work with social worker to address Financial constraints related to housing related to the management of Wound Care as evidenced by review of EMR and patient or social worker  report       Interventions: Inter-disciplinary care team collaboration (see longitudinal plan of care) Evaluation of current treatment plan related to  self management and patient's adherence to plan as established by provider BSW completed a telephone outreach with patient. He states his wife is the only one working, he is unable to work due to his injury. They currently receive 800 in foodstamps, they do not last due to his children needing certain foods. BSW will send patient resources for food, rent, and utilities.    Wound Care  (Status: New goal.) Long Term Goal  Evaluation of current treatment plan related to  Wound Care , Financial constraints related to affording housing  self-management and patient's adherence to plan as established by provider. Discussed plans with patient for ongoing care management follow up and provided patient with direct contact information for care management team Social Work referral for assistance with financial resources, telephone outreach scheduled on 07/28/22; Assessed social determinant of health barriers;  Discussed the importance of having a PCP Utilized Swahili interpreter during this entire visit RNCM will follow up over the next 14 days to assist with establishing care with a PCP  Patient Goals/Self-Care Activities: Attend all scheduled provider appointments Work with the social worker to address care coordination needs and will continue to work with the clinical team to address health care and disease management related needs       Follow up:  Patient agrees to Care Plan and Follow-up.  Plan: The Managed Medicaid care management team will reach out to the patient again over the next 30 days.  Date/time of next scheduled Social Work care management/care coordination outreach:  08/30/22  Gus Puma, Kenard Gower, [facility]  [facility]  High Risk Managed Medicaid Team  [phone]

## 2022-08-02 DIAGNOSIS — Z419 Encounter for procedure for purposes other than remedying health state, unspecified: Secondary | ICD-10-CM | POA: Diagnosis not present

## 2022-08-05 ENCOUNTER — Other Ambulatory Visit: Payer: Medicaid Other | Admitting: *Deleted

## 2022-08-05 NOTE — Patient Instructions (Signed)
Visit Information  [name] was given information about Medicaid Managed Care team care coordination services as a part of their [facility] Medicaid benefit. [name] verbally consented to engagement with the [facility] Managed Care team.   If you are experiencing a medical emergency, please call 911 or report to your local emergency department or urgent care.   If you have a non-emergency medical problem during routine business hours, please contact your provider's office and ask to speak with a nurse.   For questions related to your [facility] health plan, please call: [phone] or go here:[email or web address]  If you would like to schedule transportation through your [facility] plan, please call the following number at least 2 days in advance of your appointment: [phone].  You can also use the MTM portal or MTM mobile app to manage your rides. For the portal, please go to mtm.[email or web address].cy.  Call the [facility] at [phone], at any time, 24 hours a day, 7 days a week. If you are in danger or need immediate medical attention call 911.  If you would like help to quit smoking, call 1-800-QUIT-NOW (385)683-9431) OR Espaol: 1-855-Djelo-Ya (3-335-456-2563) o para ms informacin haga clic aqu or Text READY to 200-400 to register via text  [name],    The patient verbalized understanding of instructions, educational materials, and care plan provided today and agreed to receive a mailed copy of patient instructions, educational materials, and care plan.   Telephone follow up appointment with Managed Medicaid care management team member scheduled for:08/13/22 @ 11:15am  [name] RN, BSN [place] RN Care Coordinator   Following is a copy of your plan of care:  Care Plan : RN Care Manager Plan of Care  Updates made by [name], RN since 08/05/2022 12:00 AM     Problem: Health  Management needs related to Wound Care      Long-Range Goal: Development of Plan of Care to address Health Management needs related to Wound Care   Start Date: 07/27/2022  Expected End Date: 10/25/2022  Priority: High  Note:   Current Barriers:  Knowledge Deficits related to plan of care for management of Wound Management  Financial Constraints. Patient does not have a PCP.  RNCM Clinical Goal(s):  Patient will verbalize understanding of plan for management of Wound Care as evidenced by patient reports attend all scheduled medical appointments: reschedule missed visit with Wound Care as evidenced by provider EMR documentation        work with Education officer, museum to address Financial constraints related to housing related to the management of Wound Care as evidenced by review of EMR and patient or social worker report       Interventions: Inter-disciplinary care team collaboration (see longitudinal plan of care) Evaluation of current treatment plan related to  self management and patient's adherence to plan as established by provider    Wound Care  (Status: Goal on Track (progressing): YES.) Long Term Goal  Evaluation of current treatment plan related to  Wound Care , Financial constraints related to affording housing  self-management and patient's adherence to plan as established by provider. Discussed plans with patient for ongoing care management follow up and provided patient with direct contact information for care management team Social Work referral for assistance with financial resources, telephone outreach scheduled on 07/28/22; Assessed social determinant of health barriers;  Discussed the importance of having a PCP Utilized Swahili interpreter during this entire visit RNCM made conference  call to [phone], assisted with scheduling new patient visit with PCP on January 17 at [place] will reach out next week to assist with transportation  Patient Goals/Self-Care  Activities: Attend all scheduled provider appointments Work with the social worker to address care coordination needs and will continue to work with the clinical team to address health care and disease management related needs

## 2022-08-05 NOTE — Patient Outreach (Signed)
Medicaid Managed Care   Nurse Care Manager Note  08/05/2022 Name:  Jonathen Rathman MRN:  [id number] DOB:  [birth year]  Jhovani Griswold is an 47 y.o. year old male who is a primary patient of Patient, No Pcp Per.  The [facility] Managed Care Coordination team was consulted for assistance with:    Wound Care  Mr. Mayorquin was given information about Medicaid Managed Care Coordination team services today. Zyrus Bob Patient agreed to services and verbal consent obtained.  Engaged with patient by telephone for follow up visit in response to provider referral for case management and/or care coordination services.   Assessments/Interventions:  Review of past medical history, allergies, medications, health status, including review of consultants reports, laboratory and other test data, was performed as part of comprehensive evaluation and provision of chronic care management services.  SDOH (Social Determinants of Health) assessments and interventions performed: SDOH Interventions    Flowsheet Row Patient Outreach Telephone from 07/27/2022 in [place] Interventions Intervention Not Indicated  Housing Interventions Other (Comment)  [BSW referral, scheduled on 07/28/22 at 3pm]  Transportation Interventions Intervention Not Indicated       Care Plan  No Known Allergies  Medications Reviewed Today     Reviewed by [name], RN (Registered Nurse) on 08/05/22 at 1209  Med List Status: <None>   Medication Order Taking? Sig Documenting Provider Last Dose Status Informant  omeprazole (PRILOSEC) 40 MG capsule [id number]  Take 1 capsule (40 mg total) by mouth daily. 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   Chronic gastritis 04/09/2021   Refugee health examination 04/07/2021   Unable to read or write in  [place] or [place]  04/07/2021   Tobacco abuse 04/07/2021   Hematuria 04/07/2021   Medication monitoring encounter 03/18/2021   Skin ulcer ([place]) 02/17/2021    Conditions to be addressed/monitored per PCP order:   wound care  Care Plan : RN Care Manager Plan of Care  Updates made by [name], RN since 08/05/2022 12:00 AM     Problem: Health Management needs related to Wound Care      Long-Range Goal: Development of Plan of Care to address Health Management needs related to Wound Care   Start Date: 07/27/2022  Expected End Date: 10/25/2022  Priority: High  Note:   Current Barriers:  Knowledge Deficits related to plan of care for management of Wound Management  Financial Constraints. Patient does not have a PCP.  RNCM Clinical Goal(s):  Patient will verbalize understanding of plan for management of Wound Care as evidenced by patient reports attend all scheduled medical appointments: reschedule missed visit with Wound Care as evidenced by provider EMR documentation        work with Education officer, museum to address Financial constraints related to housing related to the management of Wound Care as evidenced by review of EMR and patient or social worker report       Interventions: Inter-disciplinary care team collaboration (see longitudinal plan of care) Evaluation of current treatment plan related to  self management and patient's adherence to plan as established by provider    Wound Care  (Status: Goal on Track (progressing): YES.) Long Term Goal  Evaluation of current treatment plan related to  Wound Care , Financial constraints related to affording housing  self-management and patient's adherence to plan as established by provider. Discussed plans with patient for ongoing care management follow up and provided patient with direct contact information for care management team Social Work referral for assistance with financial resources, telephone outreach scheduled on  07/28/22; Assessed social determinant of health barriers;  Discussed the importance of having a PCP Utilized Swahili interpreter during this entire visit RNCM made conference call to [phone], assisted with scheduling new patient visit with PCP on January 17 at [place] will reach out next week to assist with transportation  Patient Goals/Self-Care Activities: Attend all scheduled provider appointments Work with the social worker to address care coordination needs and will continue to work with the clinical team to address health care and disease management related needs       Follow Up:  Patient agrees to Care Plan and Follow-up.  Plan: The Managed Medicaid care management team will reach out to the patient again over the next 7 days.  Date/time of next scheduled RN care management/care coordination outreach:  08/13/22 @ 11:15am  [name] RN, BSN [place]  [employer] manager

## 2022-08-06 ENCOUNTER — Encounter (HOSPITAL_BASED_OUTPATIENT_CLINIC_OR_DEPARTMENT_OTHER): Payer: Medicaid Other | Attending: General Surgery | Admitting: General Surgery

## 2022-08-06 DIAGNOSIS — S91002A Unspecified open wound, left ankle, initial encounter: Secondary | ICD-10-CM | POA: Diagnosis not present

## 2022-08-06 DIAGNOSIS — L97328 Non-pressure chronic ulcer of left ankle with other specified severity: Secondary | ICD-10-CM | POA: Diagnosis not present

## 2022-08-06 DIAGNOSIS — I872 Venous insufficiency (chronic) (peripheral): Secondary | ICD-10-CM | POA: Diagnosis not present

## 2022-08-06 DIAGNOSIS — M85872 Other specified disorders of bone density and structure, left ankle and foot: Secondary | ICD-10-CM | POA: Insufficient documentation

## 2022-08-06 DIAGNOSIS — L97529 Non-pressure chronic ulcer of other part of left foot with unspecified severity: Secondary | ICD-10-CM | POA: Diagnosis not present

## 2022-08-07 NOTE — Progress Notes (Signed)
[name] ([id number]) [id number] Page 1 of 7 Visit Report for 08/06/2022 Arrival Information Details Patient Name: Date of Service: [name] [name] [name] 08/06/2022 1:15 PM Medical Record Number: [id number] Patient Account Number: [id number] Date of Birth/Sex: Treating RN: 1976/04/14 (47 y.o. Dianna [name] Primary Care Hobson Lax: PA [name], [place] Other Clinician: Referring [name]: Treating [name]/Extender: [name] in Treatment: 38 Visit Information History Since Last Visit Added or deleted any medications: No Patient Arrived: Ambulatory Any new allergies or adverse reactions: No Arrival Time: 13:05 Had a fall or experienced change in No Accompanied By: interpreter activities of daily living that may affect Transfer Assistance: Manual risk of falls: Patient Identification Verified: Yes Signs or symptoms of abuse/neglect since last visito No Patient Requires Transmission-Based Precautions: No Hospitalized since last visit: No Patient Has Alerts: No Implantable device outside of the clinic excluding No cellular tissue based products placed in the center since last visit: Has Dressing in Place as Prescribed: Yes Has Compression in Place as Prescribed: Yes Pain Present Now: No Electronic Signature(s) Signed: 08/06/2022 2:12:21 PM By: Dayton Scrape Entered By: Dayton Scrape on 08/06/2022 13:15:00 -------------------------------------------------------------------------------- Compression Therapy Details Patient Name: Date of Service: [name] [name] [name] 08/06/2022 1:15 PM Medical Record Number: [id number] Patient Account Number: [id number] Date of Birth/Sex: Treating RN: [birth year] (47 y.o. [name] Primary Care Obryan Radu: PA [name], [place] Other Clinician: Referring Keilyn Nadal: Treating Daphna Lafuente/Extender: [name] in Treatment: 78 Compression Therapy Performed for Wound Assessment: Wound  #1 Left Ankle Performed By: Clinician [name] Ard, RN Compression Type: Three Layer Post Procedure Diagnosis Same as Pre-procedure Electronic Signature(s) Signed: 08/06/2022 4:06:17 PM By: [name] Ard RN Entered By: [name] on 08/06/2022 13:38:37 [name], [name] ([id number]) [id number] Page 2 of 7 -------------------------------------------------------------------------------- Lower Extremity Assessment Details Patient Name: Date of Service: [name] [name] [name] 08/06/2022 1:15 PM Medical Record Number: [id number] Patient Account Number: [id number] Date of Birth/Sex: Treating RN: [birth year] (47 y.o. [name] Primary Care Pebbles Zeiders: PA [name], [place] Other Clinician: Referring Cela Newcom: Treating Deosha Werden/Extender: [name] in Treatment: 78 Edema Assessment Assessed: [Left: No] [Right: No] Edema: [Left: Ye] [Right: s] Calf Left: Right: Point of Measurement: 28 cm From Medial Instep 29 cm Ankle Left: Right: Point of Measurement: 8 cm From Medial Instep 21.3 cm Vascular Assessment Pulses: Dorsalis Pedis Palpable: [Left:Yes] Electronic Signature(s) Signed: 08/06/2022 4:06:17 PM By: [name] Ard RN Entered By: [name] on 08/06/2022 13:32:05 -------------------------------------------------------------------------------- Multi Wound Chart Details Patient Name: Date of Service: [name] [name] [name] 08/06/2022 1:15 PM Medical Record Number: [id number] Patient Account Number: [id number] Date of Birth/Sex: Treating RN: 26-Feb-1976 (47 y.o. M) Primary Care Elis Rawlinson: PA [name], [place] Other Clinician: Referring Travontae Freiberger: Treating Early Ord/Extender: [name] in Treatment: 78 Vital Signs Height(in): 69 Pulse(bpm): 73 Weight(lbs): 170 Blood Pressure(mmHg): 121/76 Body Mass Index(BMI): 25.1 Temperature(F): 98.2 Respiratory Rate(breaths/min): 18 [1:Photos:] [N/A:N/A] Left Ankle N/A  N/A Wound Location: Trauma N/A N/A Wounding Event: Trauma, Other N/A N/A Primary Etiology: 10/14/2020 N/A N/A Date Acquired: 30 N/A N/A Weeks of Treatment: [name], [name] ([id number]) [id number] Page 3 of 7 Open N/A N/A Wound Status: No N/A N/A Wound Recurrence: 0.9x1.9x0.2 N/A N/A Measurements L x W x D (cm) 1.343 N/A N/A A (cm) : rea 0.269 N/A N/A Volume (cm) : 97.80% N/A N/A % Reduction in Area: 98.90% N/A N/A % Reduction in Volume: Full Thickness Without Exposed N/A N/A Classification: Support Structures Medium N/A N/A Exudate A mount: Serosanguineous  N/A N/A Exudate Type: red, brown N/A N/A Exudate Color: Flat and Intact N/A N/A Wound Margin: Large (67-100%) N/A N/A Granulation A mount: Red N/A N/A Granulation Quality: Small (1-33%) N/A N/A Necrotic A mount: Fat Layer (Subcutaneous Tissue): Yes N/A N/A Exposed Structures: Fascia: No Tendon: No Muscle: No Joint: No Bone: No Large (67-100%) N/A N/A Epithelialization: Debridement - Excisional N/A N/A Debridement: Pre-procedure Verification/Time Out 13:36 N/A N/A Taken: Necrotic/Eschar, Subcutaneous, N/A N/A Tissue Debrided: Slough Skin/Subcutaneous Tissue N/A N/A Level: 1.71 N/A N/A Debridement A (sq cm): rea Curette N/A N/A Instrument: Minimum N/A N/A Bleeding: Pressure N/A N/A Hemostasis Achieved: Debridement Treatment Response: Procedure was tolerated well N/A N/A Post Debridement Measurements L x 0.9x1.9x0.2 N/A N/A W x D (cm) 0.269 N/A N/A Post Debridement Volume: (cm) Scarring: Yes N/A N/A Periwound Skin Texture: Dry/Scaly: Yes N/A N/A Periwound Skin Moisture: No Abnormalities Noted N/A N/A Periwound Skin Color: Cool/Cold N/A N/A Temperature: Compression Therapy N/A N/A Procedures Performed: Debridement Treatment Notes Electronic Signature(s) Signed: 08/06/2022 1:54:47 PM By: [name] Maudlin MD FACS Entered By: [name] on  08/06/2022 13:54:46 -------------------------------------------------------------------------------- Multi-Disciplinary Care Plan Details Patient Name: Date of Service: [name] [name] NA [place] 08/06/2022 1:15 PM Medical Record Number: [id number] Patient Account Number: [id number] Date of Birth/Sex: Treating RN: Apr 07, 1976 (47 y.o. 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 Compression as ordered Treatment Activities: Therapeutic compression applied : 02/19/2022 Notes: Wound/Skin Impairment Nursing Diagnoses: Knowledge deficit related to ulceration/compromised skin integrity Goals: Patient/caregiver will verbalize understanding of skin care regimen Date Initiated: 02/05/2021 Target Resolution Date: 10/30/2022 Goal Status: Active Interventions: Assess patient/caregiver ability to obtain necessary supplies Assess patient/caregiver ability to perform ulcer/skin care regimen upon admission and as needed Provide education on ulcer and skin care Treatment Activities: Skin care regimen initiated : 02/05/2021 Topical wound management initiated : 02/05/2021 Notes: 03/31/21: Wound care regimen ongoing, target date extended. 04/21/21: Wound care ongoing, through interpreter patient states he is doing fine with his dressing changes. Electronic Signature(s) Signed: 08/06/2022 4:06:17  PM By: [name] RN Entered By: [name] on 08/06/2022 13:41:21 -------------------------------------------------------------------------------- Pain Assessment Details Patient Name: Date of Service: [name] [name] NA [place] 08/06/2022 1:15 PM Medical Record Number: [id number] Patient Account Number: [id number] Date of Birth/Sex: Treating RN: 1975/08/08 (47 y.o. M) Primary Care Affie Gasner: PA Darnelle Spangle Other Clinician: Referring Palmina Clodfelter: Treating Benjimen Kelley/Extender: [name] in Treatment: 78 Active Problems Location of Pain Severity and Description of Pain Patient Has Paino No Site Locations [name], [name] ([id number]) [id number] Page 5 of 7 Pain Management and Medication Current Pain Management: Electronic Signature(s) Signed: 08/06/2022 2:12:21 PM By: Worthy Rancher Entered By: Worthy Rancher on 08/06/2022 13:15:16 -------------------------------------------------------------------------------- Patient/Caregiver Education Details Patient Name: Date of Service: [name] [name] [name] 1/5/2024andnbsp1:15 PM Medical Record Number: [id number] Patient Account Number: [id number] Date of Birth/Gender: Treating RN: 10-23-1975 (47 y.o. [name] Primary Care Physician: PA [name], [place] Other Clinician: Referring Physician: Treating Physician/Extender: [name] in Treatment: 28 Education Assessment Education Provided To: Patient Education Topics Provided Wound Debridement: Methods:  Explain/Verbal Responses: Reinforcements needed, State content correctly Wound/Skin Impairment: Methods: Explain/Verbal Responses: Reinforcements needed, State content correctly Electronic Signature(s) Signed: 08/06/2022 4:06:17 PM By: [name] Ard RN Entered By: [name] on 08/06/2022 13:41:41 -------------------------------------------------------------------------------- Wound Assessment Details Patient Name:  Date of Service: [name] [name], [name] 08/06/2022 1:15 PM Rosario, [name] ([id number]) [id number] Page 6 of 7 Medical Record Number: [id number] Patient Account Number: [id number] Date of Birth/Sex: Treating RN: 1976/03/07 (47 y.o. M) Primary Care Sai Moura: PA [place], [place] Other Clinician: Referring Jackey Housey: Treating Briannie Gutierrez/Extender: [name] in Treatment: 78 Wound Status Wound Number: 1 Primary Etiology: Trauma, Other Wound Location: Left Ankle Wound Status: Open Wounding Event: Trauma Date Acquired: 10/14/2020 Weeks Of Treatment: 78 Clustered Wound: No Photos Wound Measurements Length: (cm) 0.9 Width: (cm) 1.9 Depth: (cm) 0.2 Area: (cm) 1.343 Volume: (cm) 0.269 % Reduction in Area: 97.8% % Reduction in Volume: 98.9% Epithelialization: Large (67-100%) Wound Description Classification: Full Thickness Without Exposed Suppor Wound Margin: Flat and Intact Exudate Amount: Medium Exudate Type: Serosanguineous Exudate Color: red, brown t Structures Foul Odor After Cleansing: No Slough/Fibrino Yes Wound Bed Granulation Amount: Large (67-100%) Exposed Structure Granulation Quality: Red Fascia Exposed: No Necrotic Amount: Small (1-33%) Fat Layer (Subcutaneous Tissue) Exposed: Yes Necrotic Quality: Adherent Slough Tendon Exposed: No Muscle Exposed: No Joint Exposed: No Bone Exposed: No Periwound Skin Texture Texture Color No Abnormalities Noted: No No Abnormalities Noted: Yes Scarring: Yes Temperature / Pain Temperature: Cool/Cold Moisture No Abnormalities Noted: No Dry / Scaly: Yes Electronic Signature(s) Signed: 08/06/2022 2:12:21 PM By: Dayton Scrape Entered By: Dayton Scrape on 08/06/2022 13:18:58 Vitals Details -------------------------------------------------------------------------------- Platten, [name] ([id number]) [id number] Page 7 of 7 Patient Name: Date of Service: [name]  [name] [name] 08/06/2022 1:15 PM Medical Record Number: [id number] Patient Account Number: [id number] Date of Birth/Sex: Treating RN: 09-May-1976 (47 y.o. Dianna [name] Primary Care Analaura Messler: PA [name], [place] Other Clinician: Referring Deirdra Heumann: Treating Shaddai Shapley/Extender: [name] in Treatment: 78 Vital Signs Time Taken: 13:10 Temperature (F): 98.2 Height (in): 69 Pulse (bpm): 73 Weight (lbs): 170 Respiratory Rate (breaths/min): 18 Body Mass Index (BMI): 25.1 Blood Pressure (mmHg): 121/76 Reference Range: 80 - 120 mg / dl Electronic Signature(s) Signed: 08/06/2022 2:12:21 PM By: Dayton Scrape Entered By: Dayton Scrape on 08/06/2022 13:18:17

## 2022-08-07 NOTE — Progress Notes (Signed)
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 cause of wound was Trauma. The date acquired was: 10/14/2020. The wound has been in treatment 78 weeks. The wound is located on the Left Ankle. The wound measures 0.9cm length x 1.9cm width x 0.2cm depth; 1.343cm^2 area  and 0.269cm^3 volume. There is Fat Layer (Subcutaneous Tissue) exposed. There is a medium amount of serosanguineous drainage noted. The wound margin is flat and intact. There is large (67-100%) red granulation  within the wound bed. There is a small (1-33%) amount of necrotic tissue within the wound bed including Adherent Slough. The periwound skin appearance had no abnormalities noted for color. The periwound skin appearance exhibited: Scarring, Dry/Scaly. Periwound temperature was noted as Cool/Cold. 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 There was a Excisional Skin/Subcutaneous Tissue Debridement with a total area of 1.71 sq cm performed by [name], MD. With the following instrument(s): Curette to remove Viable and Non-Viable tissue/material. Material removed includes Eschar, Subcutaneous Tissue, and Slough. No specimens were taken. A time out was conducted at 13:36, prior to the start of the procedure. A Minimum amount of bleeding was controlled with Pressure. The procedure was tolerated well. Post Debridement Measurements: 0.9cm length x 1.9cm width x 0.2cm depth; 0.269cm^3 volume. Character of Wound/Ulcer Post Debridement requires further debridement. Post procedure Diagnosis Wound #1: Same as Pre-Procedure General Notes: Scribed for Dr. [name] by [name], RN. Pre-procedure diagnosis of Wound #1 is a Trauma, Other located on the Left Ankle . There was a Three Layer Compression Therapy Procedure by [name], RN. Post procedure Diagnosis Wound #1: Same as Pre-Procedure Plan Follow-up Appointments: Return Appointment in 1 week. - Dr. [name] - Room 2 Other: - interpreter required. Anesthetic: (In clinic) Topical Lidocaine 4% applied to wound bed Edema Control - Lymphedema / SCD / Other: Avoid standing for long periods of time. Exercise regularly Off-Loading: Open toe surgical shoe to: - left foot WOUND #1: - Ankle  Wound Laterality: Left Cleanser: Soap and Water 1 x Per Week/30 Days Discharge Instructions: May shower and wash wound with dial antibacterial soap and water prior to dressing change. Cleanser: Wound Cleanser 1 x Per Week/30 Days Discharge Instructions: Cleanse the wound with wound cleanser prior to applying a clean dressing using gauze sponges, not tissue or cotton balls. Peri-Wound Care: Sween Lotion (Moisturizing lotion) 1 x Per Week/30 Days Discharge Instructions: Apply moisturizing lotion as directed Topical: Gentamicin 1 x Per Week/30 Days Discharge Instructions: As directed by physician Prim Dressing: Endoform 2x2 in 1 x Per Week/30 Days ary Discharge Instructions: Moisten with saline Secondary Dressing: Drawtex 4x4 in 1 x Per Week/30 Days Discharge Instructions: Apply over primary dressing as directed. Secondary Dressing: Zetuvit Plus 4x8 in 1 x Per Week/30 Days Discharge Instructions: Apply over primary dressing as directed. Com pression Wrap: ThreePress (3 layer compression wrap) 1 x Per Week/30 Days Discharge Instructions: Apply three layer compression as directed. Com pression Stockings: Compression Stocking Compression Amount: 30-40 mmHg (left) Compression Amount: 30-40 mmHg (right) Discharge Instructions: Apply Compression Stockings daily as instructed. Apply first thing in the morning, remove at night before bed. 08/06/2022: The wound is a little bit smaller again today. There is thick slough on the surface, but the surface underneath is less fibrotic. [name], [name] ([id number]) [id number] Page 11 of 12 I used a curette to debride eschar, slough, and nonviable subcutaneous tissue from the wound. We will continue topical gentamicin with endoform and use drawtex as a backing dressing/bolster. 3 layer compression. Follow-up in 1 week. Electronic Signature(s) Signed: 08/06/2022 1:58:33 PM By: [name] MD  FACS Entered By: [name] on  08/06/2022 13:58:33 -------------------------------------------------------------------------------- HxROS Details Patient Name: Date of Service: [name] NA [place] 08/06/2022 1:15 PM Medical Record Number: [id number] Patient Account Number: [id number] Date of Birth/Sex: Treating RN: 1975-12-22 (47 y.o. M) Primary Care Provider: PA [name], NO Other Clinician: Referring Provider: Treating Provider/Extender: [name] in Treatment: 78 Information Obtained From Patient Gastrointestinal Medical History: Past Medical History Notes: Chronic Gastritis Immunizations Pneumococcal Vaccine: Received Pneumococcal Vaccination: No Implantable Devices No devices added Family and Social History Unknown History: Yes; Current every day smoker; Marital Status - Married; Alcohol Use: Rarely; Drug Use: No History; Caffeine Use: Moderate; Financial Concerns: No; Food, Clothing or Shelter Needs: No; Support System Lacking: No; Transportation Concerns: No Electronic Signature(s) Signed: 08/06/2022 3:18:58 PM By: [name] MD FACS Entered By: [name] on 08/06/2022 13:55:54 -------------------------------------------------------------------------------- SuperBill Details Patient Name: Date of Service: [name] 08/06/2022 Medical Record Number: [id number] Patient Account Number: [id number] Date of Birth/Sex: Treating RN: 1975-08-09 (47 y.o. M) Primary Care Provider: PA [name], NO Other Clinician: Referring Provider: Treating Provider/Extender: [name] in Treatment: 78 Diagnosis Coding ICD-10 Codes Code Description L97.328 Non-pressure chronic ulcer of left ankle with other specified severity [name], [name] ([id number]) [id number] Page 12 of 12 I87.332 Chronic venous hypertension (idiopathic) with ulcer and inflammation of left lower extremity Facility Procedures : CPT4 Code: [id number]  1 Description: [id number] - DEB SUBQ TISSUE 20 SQ CM/< ICD-10 Diagnosis Description L97.328 Non-pressure chronic ulcer of left ankle with other specified severity Modifier: Quantity: 1 Physician Procedures : CPT4 Code Description Modifier [id number] - WC PHYS LEVEL 4 - EST PT 25 ICD-10 Diagnosis Description L97.328 Non-pressure chronic ulcer of left ankle with other specified severity I87.332 Chronic venous hypertension (idiopathic) with ulcer and  inflammation of left lower extremity Quantity: 1 : [id number] - WC PHYS SUBQ TISS 20 SQ CM ICD-10 Diagnosis Description L97.328 Non-pressure chronic ulcer of left ankle with other specified severity Quantity: 1 Electronic Signature(s) Signed: 08/06/2022 1:59:12 PM By: [name] MD FACS Entered By: [name] on 08/06/2022 13:59:11

## 2022-08-12 ENCOUNTER — Encounter (HOSPITAL_BASED_OUTPATIENT_CLINIC_OR_DEPARTMENT_OTHER): Payer: Medicaid Other | Admitting: General Surgery

## 2022-08-12 IMAGING — CR DG CHEST 2V
2 series · 2 of 2 positions shown · non-contrast
Comparison: None.

CLINICAL DATA: 45-year-old female with positive TB test

EXAM:
CHEST - 2 VIEW

[w chest pa]
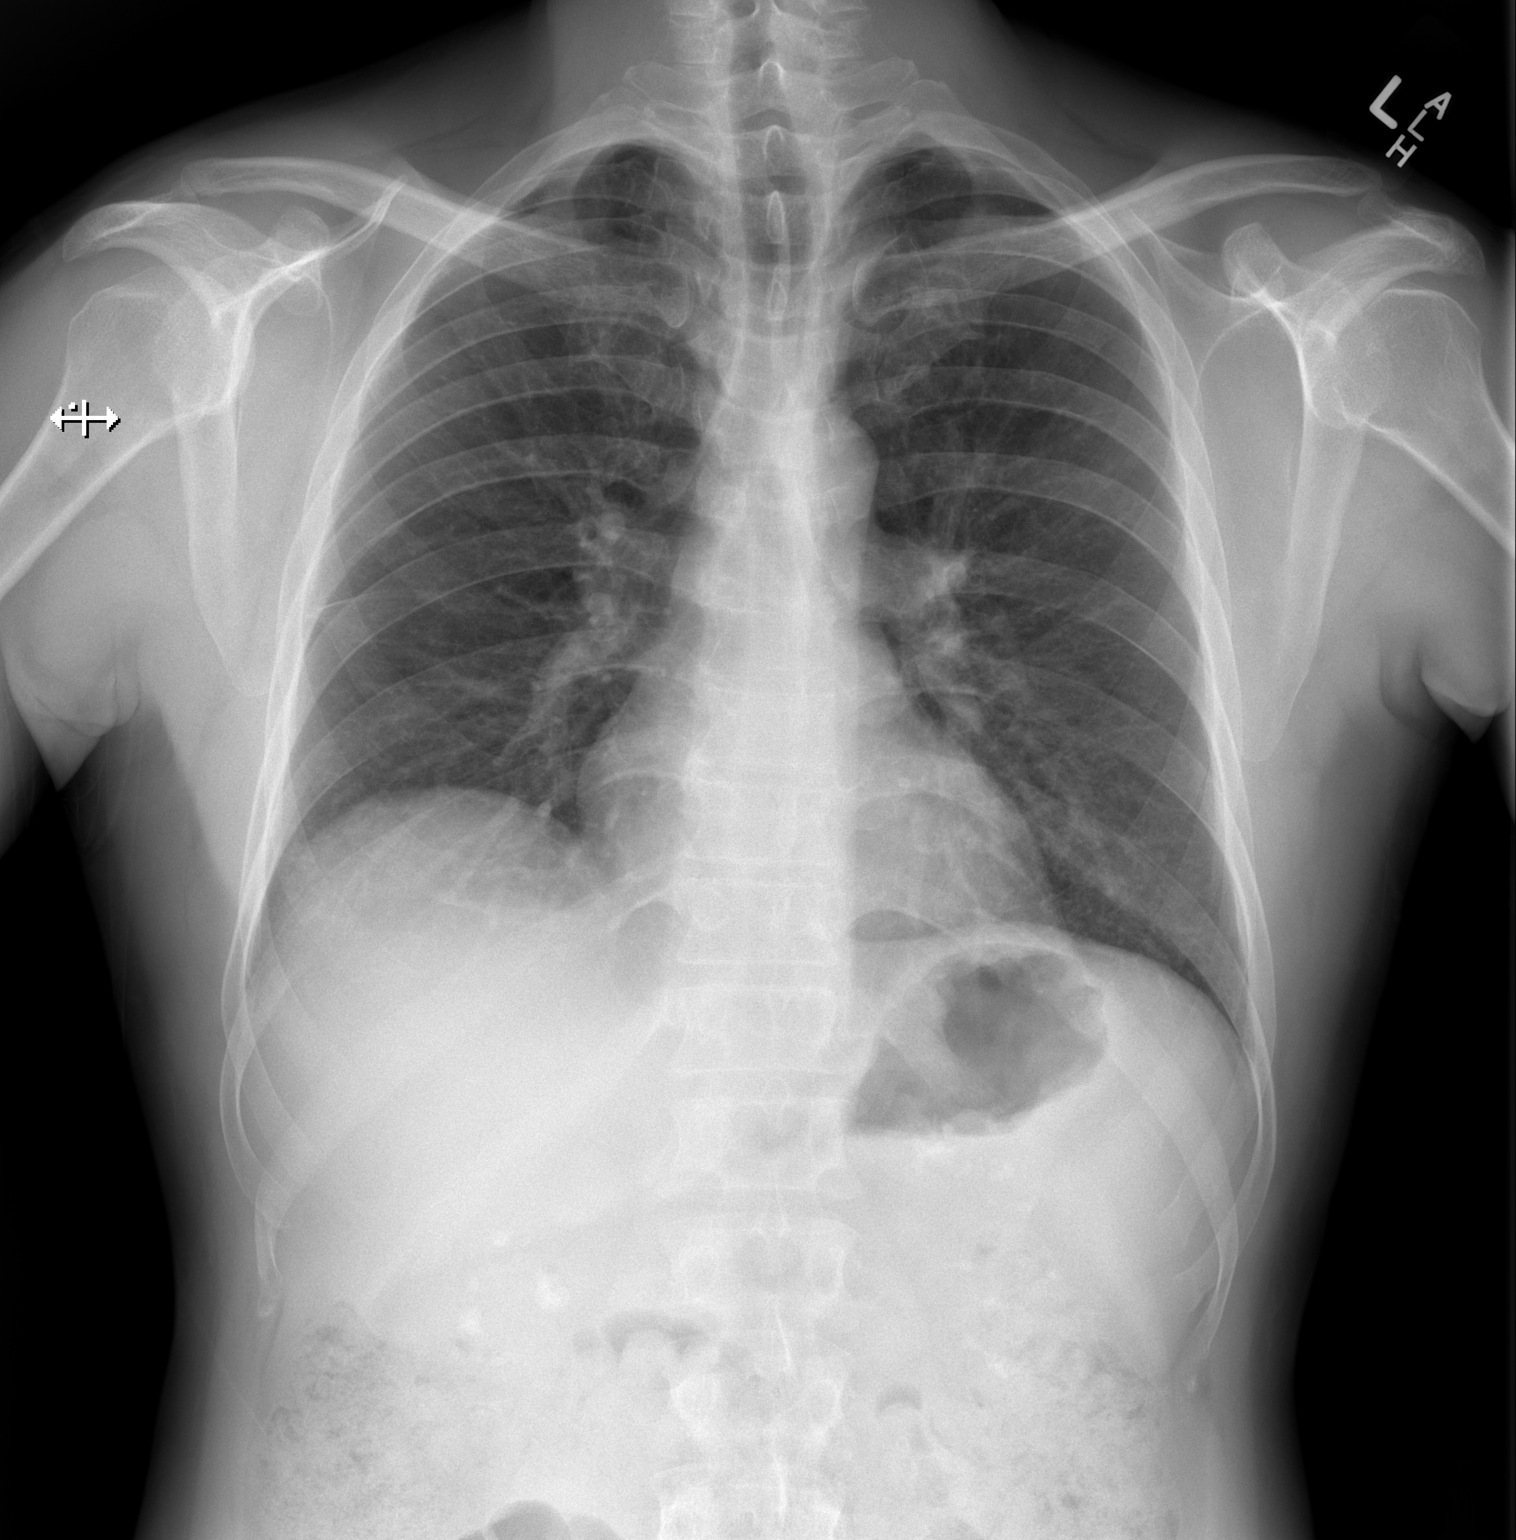

[w chest lat]
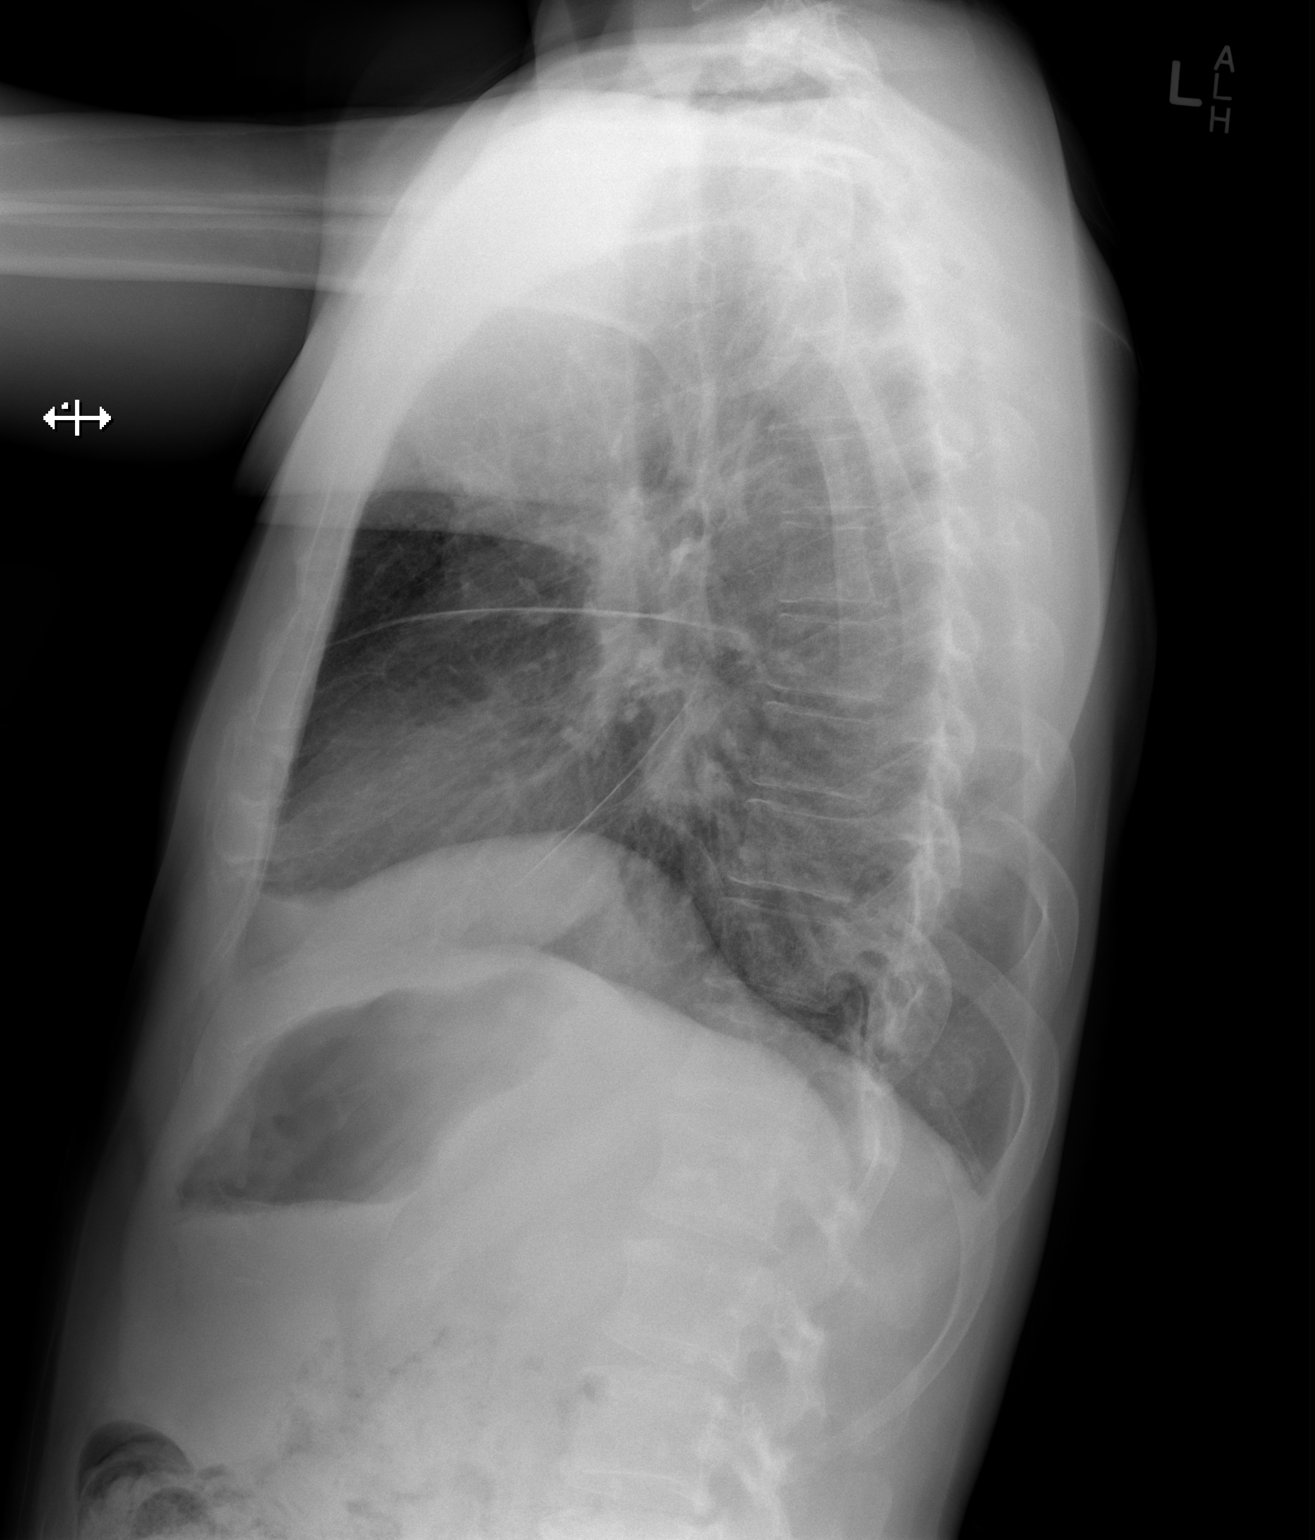

[2 of 2 positions shown; findings below may reference images not displayed]

FINDINGS: Cardiomediastinal silhouette within normal limits. No interlobular
septal thickening.

Thickening of the fissures on the lateral view.

No pneumothorax. No pleural effusion. No confluent airspace disease.

No calcified hilar nodes. No nodular changes or cystic changes of
the lungs identified.

No displaced fracture
IMPRESSION: Nonspecific fissural thickening, without overt edema. This may be
chronic finding, or reflect nonspecific inflammation/infection.

No calcified lymph nodes identified.

## 2022-08-13 ENCOUNTER — Other Ambulatory Visit: Payer: Medicaid Other | Admitting: *Deleted

## 2022-08-13 ENCOUNTER — Encounter: Payer: Self-pay | Admitting: *Deleted

## 2022-08-13 NOTE — Patient Instructions (Signed)
Visit Information  Mr. Choe was given information about Medicaid Managed Care team care coordination services as a part of their [facility] Medicaid benefit. Brenon Shackleford verbally consented to engagement with the [facility] Managed Care team.   If you are experiencing a medical emergency, please call 911 or report to your local emergency department or urgent care.   If you have a non-emergency medical problem during routine business hours, please contact your provider's office and ask to speak with a nurse.   For questions related to your [facility] health plan, please call: [phone] or go here:[email or web address]  If you would like to schedule transportation through your [facility] plan, please call the following number at least 2 days in advance of your appointment: [phone].  You can also use the MTM portal or MTM mobile app to manage your rides. For the portal, please go to [email or web address].  Call the [facility] at [phone], at any time, 24 hours a day, 7 days a week. If you are in danger or need immediate medical attention call 911.  If you would like help to quit smoking, call 1-800-QUIT-NOW (332)704-0391) OR Espaol: 1-855-Djelo-Ya (2-671-245-8099) o para ms informacin haga clic aqu or Text READY to 200-400 to register via text  Mr. Kuhl,   Please see education materials related to health maintenance provided as print materials.   The patient verbalized understanding of instructions, educational materials, and care plan provided today and agreed to receive a mailed copy of patient instructions, educational materials, and care plan.   Telephone follow up appointment with Managed Medicaid care management team member scheduled for:09/17/22 @ 10:30am  [name] RN, BSN [place] RN Care Coordinator   Following is a copy of your plan of care:  Care Plan : RN Care Manager Plan  of Care  Updates made by [name], RN since 08/13/2022 12:00 AM     Problem: Health Management needs related to Wound Care      Long-Range Goal: Development of Plan of Care to address Health Management needs related to Wound Care   Start Date: 07/27/2022  Expected End Date: 10/25/2022  Priority: High  Note:   Current Barriers:  Knowledge Deficits related to plan of care for management of Wound Management  Financial Constraints. Patient does not have a PCP.  RNCM Clinical Goal(s):  Patient will verbalize understanding of plan for management of Wound Care as evidenced by patient reports attend all scheduled medical appointments: reschedule missed visit with Wound Care as evidenced by provider EMR documentation        work with Education officer, museum to address Financial constraints related to housing related to the management of Wound Care as evidenced by review of EMR and patient or social worker report       Interventions: Inter-disciplinary care team collaboration (see longitudinal plan of care) Evaluation of current treatment plan related to  self management and patient's adherence to plan as established by provider   Wound Care  (Status: Goal on Track (progressing): YES.) Long Term Goal  Evaluation of current treatment plan related to  Wound Care , Financial constraints related to affording housing  self-management and patient's adherence to plan as established by provider. Discussed plans with patient for ongoing care management follow up and provided patient with direct contact information for care management team Assessed social determinant of health barriers;  Discussed the importance of having a PCP Utilized Swahili interpreter during this entire visit RNCM assisted with arranging  round trip transportation with [facility] [phone] to new PCP visit on 08/18/22, driver will call patient with pick up time, RNCM explained to transportation service that a Swahili interpreter will be  needed   Patient Goals/Self-Care Activities: Attend all scheduled provider appointments Work with the social worker to address care coordination needs and will continue to work with the clinical team to address health care and disease management related needs

## 2022-08-13 NOTE — Patient Outreach (Signed)
Medicaid Managed Care   Nurse Care Manager Note  08/13/2022 Name:  [name] MRN:  [id number] DOB:  February 03, 1976  [name] is an 47 y.o. year old male who is a primary patient of Patient, No Pcp Per.  The [facility] Managed Care Coordination team was consulted for assistance with:    Wound Care  [name] was given information about Medicaid Managed Care Coordination team services today. [name] Patient agreed to services and verbal consent obtained.  Engaged with patient by telephone for follow up visit in response to provider referral for case management and/or care coordination services.   Assessments/Interventions:  Review of past medical history, allergies, medications, health status, including review of consultants reports, laboratory and other test data, was performed as part of comprehensive evaluation and provision of chronic care management services.  SDOH (Social Determinants of Health) assessments and interventions performed: SDOH Interventions    Flowsheet Row Patient Outreach Telephone from 07/27/2022 in [place] Interventions Intervention Not Indicated  Housing Interventions Other (Comment)  [BSW referral, scheduled on 07/28/22 at 3pm]  Transportation Interventions Intervention Not Indicated       Care Plan  No Known Allergies  Medications Reviewed Today     Reviewed by [name], RN (Registered Nurse) on 08/13/22 at [phone]  Med List Status: <None>   Medication Order Taking? Sig Documenting Provider Last Dose Status Informant  omeprazole (PRILOSEC) 40 MG capsule [id number]  Take 1 capsule (40 mg total) by mouth daily. 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   Chronic gastritis 04/09/2021   Refugee health examination 04/07/2021   Unable to read or write in  [place] or [place]  04/07/2021   Tobacco abuse 04/07/2021   Hematuria 04/07/2021   Medication monitoring encounter 03/18/2021   Skin ulcer ([place]) 02/17/2021    Conditions to be addressed/monitored per PCP order:   Wound Care  Care Plan : RN Care Manager Plan of Care  Updates made by [name], RN since 08/13/2022 12:00 AM     Problem: Health Management needs related to Wound Care      Long-Range Goal: Development of Plan of Care to address Health Management needs related to Wound Care   Start Date: 07/27/2022  Expected End Date: 10/25/2022  Priority: High  Note:   Current Barriers:  Knowledge Deficits related to plan of care for management of Wound Management  Financial Constraints. Patient does not have a PCP.  RNCM Clinical Goal(s):  Patient will verbalize understanding of plan for management of Wound Care as evidenced by patient reports attend all scheduled medical appointments: reschedule missed visit with Wound Care as evidenced by provider EMR documentation        work with Education officer, museum to address Financial constraints related to housing related to the management of Wound Care as evidenced by review of EMR and patient or social worker report       Interventions: Inter-disciplinary care team collaboration (see longitudinal plan of care) Evaluation of current treatment plan related to  self management and patient's adherence to plan as established by provider   Wound Care  (Status: Goal on Track (progressing): YES.) Long Term Goal  Evaluation of current treatment plan related to  Wound Care , Financial constraints related to affording housing  self-management  and patient's adherence to plan as established by provider. Discussed plans with patient for ongoing care management follow up and provided patient with direct contact information for care management team Assessed social determinant of health barriers;  Discussed the importance of having a PCP Utilized Swahili  interpreter during this entire visit RNCM assisted with arranging round trip transportation with [facility] [phone] to new PCP visit on 08/18/22, driver will call patient with pick up time, RNCM explained to transportation service that a Swahili interpreter will be needed   Patient Goals/Self-Care Activities: Attend all scheduled provider appointments Work with the social worker to address care coordination needs and will continue to work with the clinical team to address health care and disease management related needs       Follow Up:  Patient agrees to Care Plan and Follow-up.  Plan: The Managed Medicaid care management team will reach out to the patient again over the next 30 days.  Date/time of next scheduled RN care management/care coordination outreach:  09/17/22 @ 10:30am  [name] RN, BSN [place] RN Care Coordinator

## 2022-08-18 ENCOUNTER — Encounter: Payer: Medicaid Other | Admitting: Family

## 2022-08-30 ENCOUNTER — Encounter (HOSPITAL_BASED_OUTPATIENT_CLINIC_OR_DEPARTMENT_OTHER): Payer: Medicaid Other | Admitting: General Surgery

## 2022-08-31 ENCOUNTER — Encounter (HOSPITAL_BASED_OUTPATIENT_CLINIC_OR_DEPARTMENT_OTHER): Payer: Medicaid Other | Admitting: General Surgery

## 2022-08-31 DIAGNOSIS — L97529 Non-pressure chronic ulcer of other part of left foot with unspecified severity: Secondary | ICD-10-CM | POA: Diagnosis not present

## 2022-08-31 DIAGNOSIS — S91002A Unspecified open wound, left ankle, initial encounter: Secondary | ICD-10-CM | POA: Diagnosis not present

## 2022-08-31 DIAGNOSIS — L97328 Non-pressure chronic ulcer of left ankle with other specified severity: Secondary | ICD-10-CM | POA: Diagnosis not present

## 2022-08-31 DIAGNOSIS — I872 Venous insufficiency (chronic) (peripheral): Secondary | ICD-10-CM | POA: Diagnosis not present

## 2022-08-31 DIAGNOSIS — M85872 Other specified disorders of bone density and structure, left ankle and foot: Secondary | ICD-10-CM | POA: Diagnosis not present

## 2022-08-31 NOTE — Progress Notes (Signed)
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 malodorous. Patient History Information obtained from Patient. Family History Unknown History. Social History Current every day smoker, Marital Status - Married, Alcohol Use - Rarely, Drug Use - No History, Caffeine Use - Moderate. Medical A Surgical History Notes nd Gastrointestinal Chronic Gastritis Commins, [name]  ([id number]) [id number] Page 10 of 12 Objective Constitutional no acute distress. Vitals Time Taken: 8:50 AM, Height: 69 in, Weight: 170 lbs, BMI: 25.1, Temperature: 98.3 F, Pulse: 81 bpm, Respiratory Rate: 16 breaths/min, Blood Pressure: 111/75 mmHg. Respiratory Normal work of breathing on room air. General Notes: 08/31/2022: His foot and leg are completely macerated. The wound is larger, has a thick layer of slough on the surface and is malodorous. Integumentary (Hair, Skin) Wound #1 status is Open. Original cause of wound was Trauma. The date acquired was: 10/14/2020. The wound has been in treatment 81 weeks. The wound is located on the Left Ankle. The wound measures 1.2cm length x 2.2cm width x 0.2cm depth; 2.073cm^2 area and 0.415cm^3 volume. There is Fat Layer (Subcutaneous Tissue) exposed. There is no tunneling or undermining noted. There is a medium amount of serosanguineous drainage noted. The wound margin is flat and intact. There is medium (34-66%) red granulation within the wound bed. There is a medium (34-66%) amount of necrotic tissue within the wound bed including Eschar and Adherent Slough. The periwound skin appearance had no abnormalities noted for color. The periwound skin appearance exhibited: Scarring, Maceration. The periwound skin appearance did not exhibit: Dry/Scaly. Periwound temperature was noted as No Abnormality. 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 There was a Excisional Skin/Subcutaneous Tissue Debridement with a total area of 2.64 sq cm performed by [name] Guessannon, Ezme Duch, MD. With the following instrument(s): Curette to remove Non-Viable tissue/material. Material  removed includes Subcutaneous Tissue and Slough and after achieving pain control using Lidocaine 5% topical ointment. No specimens were taken. A time out was conducted at 09:06, prior to the start of the procedure. A Minimum amount of bleeding was controlled with Pressure. The procedure was tolerated well with a pain level of 0 throughout and a pain level of 0 following the procedure. Post Debridement Measurements: 1.2cm length x 2.2cm width x 0.2cm depth; 0.415cm^3 volume. Character of Wound/Ulcer Post Debridement is improved. Post procedure Diagnosis Wound #1: Same as Pre-Procedure General Notes: Scribed for Dr. Lady Garyannon by J.Scotton. Pre-procedure diagnosis of Wound #1 is a Trauma, Other located on the Left Ankle . There was a Three Layer Compression Therapy Procedure by Karie SchwalbeScotton, Joanne, RN. Post procedure Diagnosis Wound #1: Same as Pre-Procedure Plan Follow-up Appointments: Return Appointment in 1 week. - Dr. Lady Garyannon - Room 2 Other: - Kinyarwanda interpreter required. Anesthetic: (In clinic) Topical Lidocaine 4% applied to wound bed - Used in Clinic Bathing/ Shower/ Hygiene: Other Bathing/Shower/Hygiene Orders/Instructions: - Please use a " CAST PROTECTOR" when showering. This can be purchased from [employer], Medical supply store,[employer]. Cost is approximately $14-27. Edema Control - Lymphedema / SCD / Other: Avoid standing for long periods of time. Exercise regularly Off-Loading: Open toe surgical shoe to: - left foot WOUND #1: - Ankle Wound Laterality: Left Cleanser: Soap and Water 1 x Per Week/30 Days Discharge Instructions: May shower and wash wound with dial antibacterial soap and water prior to dressing change. Granato, [name] ([id number]) [id number]  Page 11 of 12 Cleanser: Wound Cleanser 1 x Per Week/30 Days Discharge Instructions: Cleanse the wound with wound cleanser prior to applying a clean dressing using gauze sponges, not tissue or cotton  balls. Peri-Wound Care: Sween Lotion (Moisturizing lotion) 1 x Per Week/30 Days Discharge Instructions: Apply moisturizing lotion as directed Topical: Gentamicin 1 x Per Week/30 Days Discharge Instructions: As directed by physician Prim Dressing: Endoform 2x2 in 1 x Per Week/30 Days ary Discharge Instructions: Moisten with saline Secondary Dressing: Drawtex 4x4 in 1 x Per Week/30 Days Discharge Instructions: Apply over primary dressing as directed. Secondary Dressing: Zetuvit Plus 4x8 in 1 x Per Week/30 Days Discharge Instructions: Apply over primary dressing as directed. Com pression Wrap: ThreePress (3 layer compression wrap) 1 x Per Week/30 Days Discharge Instructions: Apply three layer compression as directed. Com pression Stockings: Compression Stocking Compression Amount: 30-40 mmHg (left) Compression Amount: 30-40 mmHg (right) Discharge Instructions: Apply Compression Stockings daily as instructed. Apply first thing in the morning, remove at night before bed. 08/31/2022: His foot and leg are completely macerated. The wound is larger, has a thick layer of slough on the surface and is malodorous. I used a curette to debride the slough from his wound. Through the interpreter, we showed him what a proper cast protector looks like and asked him to please use 1 of these, rather than a plastic bag when showering. 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 Continue 3 layer compression. Follow-up in 1 week. Electronic Signature(s) Signed: 09/02/2022 8:02:23 AM By: [name] Guess MD FACS Previous Signature: 09/01/2022 9:26:13 AM Version By: [name] Guess MD FACS Previous Signature: 08/31/2022 9:30:40 AM Version By: [name] Guess MD FACS Entered By: [name] on 09/02/2022  08:02:23 -------------------------------------------------------------------------------- HxROS Details Patient Name: Date of Service: [name] 08/31/2022 8:45 A M Medical Record Number: [id number] Patient Account Number: [id number] Date of Birth/Sex: Treating RN: 19-Aug-1975 (47 y.o. M) Primary Care Provider: PA Zenovia Jordan, NO Other Clinician: Referring Provider: Treating Provider/Extender: Horton Marshall in Treatment: 81 Information Obtained From Patient Gastrointestinal Medical History: Past Medical History Notes: Chronic Gastritis Immunizations Pneumococcal Vaccine: Received Pneumococcal Vaccination: No Implantable Devices No devices added Family and Social History Unknown History: Yes; Current every day smoker; Marital Status - Married; Alcohol Use: Rarely; Drug Use: No History; Caffeine Use: Moderate; Financial Concerns: No; Food, Clothing or Shelter Needs: No; Support System Lacking: No; Transportation Concerns: No Electronic Signature(s) Signed: 08/31/2022 10:36:34 AM By: [name] Guess MD FACS Entered By: [name] on 08/31/2022 09:26:43 Inghram, [name] ([id number]) [id number] Page 12 of 12 -------------------------------------------------------------------------------- SuperBill Details Patient Name: Date of Service: [name] 08/31/2022 Medical Record Number: [id number] Patient Account Number: [id number] Date of Birth/Sex: Treating RN: Jan 18, 1976 (47 y.o. M) Primary Care Provider: PA Zenovia Jordan, NO Other Clinician: Referring Provider: Treating Provider/Extender: Horton Marshall in Treatment: 81 Diagnosis Coding ICD-10 Codes Code Description L97.328 Non-pressure chronic ulcer of left ankle with other specified severity I87.332 Chronic venous hypertension (idiopathic) with ulcer and inflammation of left lower extremity Facility Procedures : CPT4 Code:  [id number] Description: 11042 - DEB SUBQ TISSUE 20 SQ CM/< ICD-10 Diagnosis Description L97.328 Non-pressure chronic ulcer of left ankle with other specified severity Modifier: Quantity: 1 Physician Procedures : CPT4 Code Description Modifier [id number] 99214 - WC PHYS LEVEL 4 - EST PT  25 ICD-10 Diagnosis Description L97.328 Non-pressure chronic ulcer of left ankle with other specified severity I87.332 Chronic venous hypertension (idiopathic) with ulcer and  inflammation of left lower extremity Quantity: 1 : [id number] 11042 - WC PHYS SUBQ TISS 20 SQ CM ICD-10 Diagnosis Description L97.328 Non-pressure chronic ulcer of left ankle with other specified severity Quantity: 1 Electronic Signature(s) Signed: 09/02/2022 8:02:42 AM By: [name] Guessannon, Kalyiah Saintil MD FACS Previous Signature: 08/31/2022 9:31:34 AM Version By: [name] Guessannon, Reuben Knoblock MD FACS Entered By: [name] Guessannon, Alicea Wente on 09/02/2022 08:02:42

## 2022-09-02 DIAGNOSIS — Z419 Encounter for procedure for purposes other than remedying health state, unspecified: Secondary | ICD-10-CM | POA: Diagnosis not present

## 2022-09-07 ENCOUNTER — Encounter (HOSPITAL_BASED_OUTPATIENT_CLINIC_OR_DEPARTMENT_OTHER): Payer: Medicaid Other | Attending: General Surgery | Admitting: General Surgery

## 2022-09-07 DIAGNOSIS — I872 Venous insufficiency (chronic) (peripheral): Secondary | ICD-10-CM | POA: Insufficient documentation

## 2022-09-07 DIAGNOSIS — L97328 Non-pressure chronic ulcer of left ankle with other specified severity: Secondary | ICD-10-CM | POA: Diagnosis not present

## 2022-09-07 DIAGNOSIS — M85872 Other specified disorders of bone density and structure, left ankle and foot: Secondary | ICD-10-CM | POA: Insufficient documentation

## 2022-09-07 DIAGNOSIS — L97322 Non-pressure chronic ulcer of left ankle with fat layer exposed: Secondary | ICD-10-CM | POA: Diagnosis not present

## 2022-09-07 DIAGNOSIS — L97529 Non-pressure chronic ulcer of other part of left foot with unspecified severity: Secondary | ICD-10-CM | POA: Diagnosis not present

## 2022-09-07 DIAGNOSIS — F17200 Nicotine dependence, unspecified, uncomplicated: Secondary | ICD-10-CM | POA: Diagnosis not present

## 2022-09-08 NOTE — Progress Notes (Signed)
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 opened up just proximal to the main site. Patient History Information obtained  from Patient. Family History Unknown History. Social History Current every day smoker, Marital Status - Married, Alcohol Use - Rarely, Drug Use - No History, Caffeine Use - Moderate. Medical A Surgical History Notes nd Gastrointestinal [name], [name] ([id number]) [id number] Page 10 of 12 Chronic Gastritis Objective Constitutional no acute distress. Vitals Time Taken: 8:23 AM, Height: 69 in, Weight: 170 lbs, BMI: 25.1, Temperature: 98.4 F, Pulse: 60 bpm, Respiratory Rate: 18 breaths/min, Blood Pressure: 130/74 mmHg. General Notes: 09/07/2022: The wound looks better this week. It is smaller with some slough on the surface. A small satellite wound has opened up just proximal to the main site. Integumentary (Hair, Skin) Wound #1 status is Open. Original cause of wound was Trauma. The date acquired was: 10/14/2020. The wound has been in treatment 82 weeks. The wound is located on the Left Ankle. The wound measures 1.2cm length x 1.8cm width x 0.2cm depth; 1.696cm^2 area and 0.339cm^3 volume. There is Fat Layer (Subcutaneous Tissue) exposed. There is no tunneling or undermining noted. There is a medium amount of serosanguineous drainage noted. The wound margin is flat and intact. There is large (67-100%) red granulation within the wound bed. There is a small (1-33%) amount of necrotic tissue within the wound bed including Adherent Slough. The periwound skin appearance had no abnormalities noted for color. The periwound skin appearance exhibited: Scarring. The periwound skin appearance did not exhibit: Dry/Scaly, Maceration. Periwound temperature was noted as No Abnormality. Assessment Active Problems ICD-10 Non-pressure chronic ulcer of left ankle with other specified severity Chronic venous hypertension (idiopathic) with ulcer and inflammation of left lower extremity Procedures Wound #1 Pre-procedure diagnosis of Wound #1 is a Trauma, Other located on the  Left Ankle . There was a Excisional Skin/Subcutaneous Tissue Debridement with a total area of 2.16 sq cm performed by [name] Maudlin, MD. With the following instrument(s): Curette to remove Non-Viable tissue/material. Material removed includes Eschar, Subcutaneous Tissue, and Slough after achieving pain control using Lidocaine 4% T opical Solution. No specimens were taken. A time out was conducted at 08:39, prior to the start of the procedure. A Minimum amount of bleeding was controlled with Pressure. The procedure was tolerated well. Post Debridement Measurements: 1.2cm length x 1.8cm width x 0.2cm depth; 0.339cm^3 volume. Character of Wound/Ulcer Post Debridement is improved. Post procedure Diagnosis Wound #1: Same as Pre-Procedure General Notes: scribed for Dr. Celine [name] by [name] Peals, RN. Pre-procedure diagnosis of Wound #1 is a Trauma, Other located on the Left Ankle . There was a Three Layer Compression Therapy Procedure by [name] Peals, RN. Post procedure Diagnosis Wound #1: Same as Pre-Procedure Plan Follow-up Appointments: Return Appointment in 1 week. - Dr. Celine [name] - Room 2 Other: - [place] interpreter required. Anesthetic: (In clinic) Topical Lidocaine 4% applied to wound bed - Used in Clinic Bathing/ Shower/ Hygiene: Other Bathing/Shower/Hygiene Orders/Instructions: - Please use a " CAST PROTECTOR" when showering. This can be purchased from [employer], [place] [employer]. Cost is approximately $14-27. Edema Control - Lymphedema / SCD / Other: Avoid standing for long periods of time. Exercise regularly Off-Loading: Open toe surgical shoe to: - left foot The following medication(s) was prescribed:  lidocaine topical 4 % cream cream topical was prescribed at facility [facility], [name] ([id number]) [id number].pdf Page 11 of 12 WOUND #1: - Ankle Wound Laterality: Left Cleanser: Soap and Water 1 x Per Week/30 Days Discharge  Instructions: May shower and wash wound with dial antibacterial soap and water prior to dressing change. Cleanser: Wound Cleanser 1 x Per Week/30 Days Discharge Instructions: Cleanse the wound with wound cleanser prior to applying a clean dressing using gauze sponges, not tissue or cotton balls. Peri-Wound Care: Sween Lotion (Moisturizing lotion) 1 x Per Week/30 Days Discharge Instructions: Apply moisturizing lotion as directed Topical: Gentamicin 1 x Per Week/30 Days Discharge Instructions: As directed by physician Prim Dressing: Endoform 2x2 in 1 x Per Week/30 Days ary Discharge Instructions: Moisten with saline Secondary Dressing: Drawtex 4x4 in 1 x Per Week/30 Days Discharge Instructions: Apply over primary dressing as directed. Secondary Dressing: Zetuvit Plus 4x8 in 1 x Per Week/30 Days Discharge Instructions: Apply over primary dressing as directed. Com pression Wrap: ThreePress (3 layer compression wrap) 1 x Per Week/30 Days Discharge Instructions: Apply three layer compression as directed. Com pression Stockings: Compression Stocking Compression Amount: 30-40 mmHg (left) Compression Amount: 30-40 mmHg (right) Discharge Instructions: Apply Compression Stockings daily as instructed. Apply first thing in the morning, remove at night before bed. 09/07/2022: The wound looks better this week. It is smaller with some slough on the surface. A small satellite wound has opened up just proximal to the main site. I used a curette to debride slough, nonviable subcutaneous tissue, and eschar from his wound. We will continue topical gentamicin, endoform, and drawtex with 3 layer compression. A new note was provided for him for his employer. Follow-up in 1 week. Electronic Signature(s) Signed: 09/07/2022 8:47:16 AM By: [name] Maudlin MD FACS Entered By: [name] on 09/07/2022 08:47:16 -------------------------------------------------------------------------------- HxROS Details Patient  Name: Date of Service: [name], [name] [place] 09/07/2022 8:30 A M Medical Record Number: [id number] Patient Account Number: [id number] Date of Birth/Sex: Treating RN: 1976/07/30 (47 y.o. M) Primary Care Provider: PA [name], NO Other Clinician: Referring Provider: Treating Provider/Extender: [name] in Treatment: 82 Information Obtained From Patient Gastrointestinal Medical History: Past Medical History Notes: Chronic Gastritis Immunizations Pneumococcal Vaccine: Received Pneumococcal Vaccination: No Implantable Devices No devices added Family and Social History Unknown History: Yes; Current every day smoker; Marital Status - Married; Alcohol Use: Rarely; Drug Use: No History; Caffeine Use: Moderate; Financial Concerns: No; Food, Clothing or Shelter Needs: No; Support System Lacking: No; Transportation Concerns: No Electronic Signature(s) Signed: 09/07/2022 11:19:40 AM By: [name] Maudlin MD FACS Entered By: [name] on 09/07/2022 08:46:02 [name], [name] ([id number] Page 12 of 12 -------------------------------------------------------------------------------- SuperBill Details Patient Name: Date of Service: [name] 09/07/2022 Medical Record Number: [id number] Patient Account Number: [id number] Date of Birth/Sex: Treating RN: 02-Feb-1976 (47 y.o. M) Primary Care Provider: PA [name], NO Other Clinician: Referring Provider: Treating Provider/Extender: [name] in Treatment: 82 Diagnosis Coding ICD-10 Codes Code Description L97.328 Non-pressure chronic ulcer of left ankle with other specified severity I87.332 Chronic venous hypertension (idiopathic) with ulcer and inflammation of left lower extremity Facility Procedures : CPT4 Code: [id number] Description: [id number] - DEB SUBQ TISSUE 20 SQ CM/< ICD-10 Diagnosis Description L97.328 Non-pressure chronic ulcer of left  ankle with other specified severity Modifier: Quantity: 1 Physician Procedures : CPT4 Code Description Modifier [id number] - WC PHYS LEVEL 4 - EST PT 25 ICD-10 Diagnosis Description L97.328 Non-pressure chronic ulcer of left ankle  with other specified severity I87.332 Chronic venous hypertension (idiopathic) with ulcer and  inflammation of left lower extremity Quantity: 1 : [id number] - WC PHYS SUBQ TISS 20 SQ CM ICD-10 Diagnosis Description L97.328 Non-pressure chronic ulcer of left ankle with other specified severity Quantity: 1 Electronic Signature(s) Signed: 09/07/2022 9:05:06 AM By: [name] Maudlin MD FACS Entered By: [name] on 09/07/2022 09:05:06

## 2022-09-08 NOTE — Progress Notes (Signed)
[name], [name] ([id number]) [id number] Page 1 of 7 Visit Report for 09/07/2022 Arrival Information Details Patient Name: Date of Service: [name] [place] 09/07/2022 8:30 [name] M Medical Record Number: [id number] Patient Account Number: [id number] Date of Birth/Sex: Treating RN: [birth year] (47 y.o. [name] Primary Care Karsen Nakanishi: PA [name], NO Other Clinician: Referring Tehilla Coffel: Treating Faustine Tates/Extender: [name] in Treatment: 9 Visit Information History Since Last Visit Added or deleted any medications: No Patient Arrived: Ambulatory Any new allergies or adverse reactions: No Arrival Time: 08:21 Had [name] fall or experienced change in No Accompanied By: translator activities of daily living that may affect Transfer Assistance: Manual risk of falls: Patient Identification Verified: Yes Signs or symptoms of abuse/neglect since last visito No Secondary Verification Process Completed: Yes Hospitalized since last visit: No Patient Requires Transmission-Based Precautions: No Implantable device outside of the clinic excluding No Patient Has Alerts: No cellular tissue based products placed in the center since last visit: Has Dressing in Place as Prescribed: Yes Has Compression in Place as Prescribed: Yes Pain Present Now: No Electronic Signature(s) Signed: 09/07/2022 4:20:35 PM By: [name] Entered By: [name] on 09/07/2022 08:23:19 -------------------------------------------------------------------------------- Compression Therapy Details Patient Name: Date of Service: [name] [name] [place] 09/07/2022 8:30 [name] M Medical Record Number: [id number] Patient Account Number: [id number] Date of Birth/Sex: Treating RN: [birth year] (47 y.o. [name] Primary Care Varsha Knock: PA [name], NO Other Clinician: Referring Anisia Leija: Treating Annaclaire Walsworth/Extender: [name] in  Treatment: 82 Compression Therapy Performed for Wound Assessment: Wound #1 Left Ankle Performed By: Clinician [name], RN Compression Type: Three Layer Post Procedure Diagnosis Same as Pre-procedure Electronic Signature(s) Signed: 09/07/2022 4:20:35 PM By: [name] Entered By: [name] on 09/07/2022 08:41:11 Melody, [name] ([id number]) [id number] Page 2 of 7 -------------------------------------------------------------------------------- Encounter Discharge Information Details Patient Name: Date of Service: [name] [name] 09/07/2022 8:30 [name] M Medical Record Number: [id number] Patient Account Number: [id number] Date of Birth/Sex: Treating RN: April 07, 1976 (47 y.o. [name] Primary Care Clement Deneault: PA [name], NO Other Clinician: Referring Kaylise Blakeley: Treating Jossilyn Benda/Extender: [name] in Treatment: 59 Encounter Discharge Information Items Post Procedure Vitals Discharge Condition: Stable Temperature (F): 98.4 Ambulatory Status: Ambulatory Pulse (bpm): 60 Discharge Destination: Home Respiratory Rate (breaths/min): 18 Transportation: Private Auto Blood Pressure (mmHg): 130/74 Accompanied By: translator Schedule Follow-up Appointment: Yes Clinical Summary of Care: Patient Declined Electronic Signature(s) Signed: 09/07/2022 4:20:35 PM By: [name] Entered By: [name] on 09/07/2022 09:02:43 -------------------------------------------------------------------------------- Lower Extremity Assessment Details Patient Name: Date of Service: [name] [name] [place] 09/07/2022 8:30 [name] M Medical Record Number: [id number] Patient Account Number: [id number] Date of Birth/Sex: Treating RN: Jul 22, 1976 (47 y.o. [name] Primary Care Camari Wisham: PA [name], NO Other Clinician: Referring Salahuddin Arismendez: Treating Torry Adamczak/Extender: [name] in  Treatment: 82 Edema Assessment Assessed: [Left: No] [Right: No] Edema: [Left: Ye] [Right: s] Calf Left: Right: Point of Measurement: 28 cm From Medial Instep 28.8 cm Ankle Left: Right: Point of Measurement: 8 cm From Medial Instep 20.8 cm Vascular Assessment Pulses: Dorsalis Pedis Palpable: [Left:Yes] Electronic Signature(s) Signed: 09/07/2022 4:20:35 PM By: [name] Entered By: [name] on 09/07/2022 08:32:28 Multi Wound Chart Details -------------------------------------------------------------------------------- Boldin, [name] ([id number]) [id number] Page 3 of 7 Patient Name: Date of Service: [name] [name] [place] 09/07/2022 8:30 [name] M Medical Record Number: [id number] Patient Account Number: [id number] Date of Birth/Sex: Treating RN: [birth year] (47 y.o. M) Primary Care Ramond Darnell: PA [place], [place]  Other Clinician: Referring Nissi Doffing: Treating Marion Seese/Extender: [name] in Treatment: 82 Vital Signs Height(in): 69 Pulse(bpm): 60 Weight(lbs): 170 Blood Pressure(mmHg): 130/74 Body Mass Index(BMI): 25.1 Temperature(F): 98.4 Respiratory Rate(breaths/min): 18 Wound Assessments Wound Number: 1 [name] [name] Photos: [name] [name] Left Ankle [name] [name] Wound Location: Trauma [name] [name] Wounding Event: Trauma, Other [name] [name] Primary Etiology: 10/14/2020 [name] [name] Date Acquired: 89 [name] [name] Weeks of Treatment: Open [name] [name] Wound Status: No [name] [name] Wound Recurrence: 1.2x1.8x0.2 [name] [name] Measurements L x W x D (cm) 1.696 [name] [name] [name] (cm) : rea 0.339 [name] [name] Volume (cm) : 97.20% [name] [name] % Reduction in [name] rea: 98.60% [name] [name] % Reduction in Volume: Full Thickness Without Exposed [name] [name] Classification: Support Structures Medium [name] [name] Exudate [name] mount: Serosanguineous [name] [name] Exudate Type: red, brown [name] [name] Exudate Color: Flat and Intact [name] [name] Wound Margin: Large (67-100%) [name] [name] Granulation [name] mount: Red  [name] [name] Granulation Quality: Small (1-33%) [name] [name] Necrotic [name] mount: Fat Layer (Subcutaneous Tissue): Yes [name] [name] Exposed Structures: Fascia: No Tendon: No Muscle: No Joint: No Bone: No Large (67-100%) [name] [name] Epithelialization: Debridement - Excisional [name] [name] Debridement: Pre-procedure Verification/Time Out 08:39 [name] [name] Taken: Lidocaine 4% Topical Solution [name] [name] Pain Control: Necrotic/Eschar, Subcutaneous, [name] [name] Tissue Debrided: Slough Skin/Subcutaneous Tissue [name] [name] Level: 2.16 [name] [name] Debridement [name] (sq cm): rea Curette [name] [name] Instrument: Minimum [name] [name] Bleeding: Pressure [name] [name] Hemostasis Achieved: Debridement Treatment Response: Procedure was tolerated well [name] [name] Post Debridement Measurements L x 1.2x1.8x0.2 [name] [name] W x D (cm) 0.339 [name] [name] Post Debridement Volume: (cm) Scarring: Yes [name] [name] Periwound Skin Texture: Maceration: No [name] [name] Periwound Skin Moisture: Dry/Scaly: No No Abnormalities Noted [name] [name] Periwound Skin Color: No Abnormality [name] [name] Temperature: Compression Therapy [name] [name] Procedures Performed: Debridement Treatment Notes Electronic Signature(s) [name], [name] ([id number]) [id number] Page 4 of 7 Signed: 09/07/2022 8:45:04 AM By: [name] MD FACS Entered By: [name] on 09/07/2022 08:45:04 -------------------------------------------------------------------------------- Multi-Disciplinary Care Plan Details Patient Name: Date of Service: [name] [name] 09/07/2022 8:30 [name] M Medical Record Number: [id number] Patient Account Number: [id number] Date of Birth/Sex: Treating RN: 1976/01/04 (47 y.o. 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 Compression as ordered Treatment Activities: Therapeutic compression applied : 02/19/2022 Notes: Wound/Skin Impairment Nursing Diagnoses: Knowledge deficit related to ulceration/compromised skin integrity Goals: Patient/caregiver will verbalize understanding of skin care regimen Date Initiated: 02/05/2021 Target Resolution Date: 10/30/2022 Goal Status: Active Interventions: Assess patient/caregiver ability to obtain necessary supplies Assess patient/caregiver ability to perform ulcer/skin care regimen upon admission and as needed Provide education on ulcer and skin care Treatment Activities: Skin care regimen initiated : 02/05/2021 Topical wound management initiated : 02/05/2021 Notes: 03/31/21: Wound care regimen ongoing, target date extended. 04/21/21: Wound care ongoing, through interpreter patient states he is doing fine with his dressing changes. Electronic Signature(s) Signed: 09/07/2022 4:20:35 PM By: [name] Entered By: [name] on 09/07/2022 08:41:40 [name], [name] ([id number]) [id number] Page 5 of 7 -------------------------------------------------------------------------------- Pain Assessment Details Patient Name: Date of Service: [name], [name] [name]  [name] 09/07/2022 8:30 [name] M Medical Record Number: [id number] Patient Account Number: [id number] Date of Birth/Sex: Treating RN: [birth year] (47 y.o. [name] Primary Care Lee-Ann Gal: PA Zenovia Jordan, NO Other Clinician: Referring Shanesha Bednarz: Treating Shernell Saldierna/Extender: Horton Marshall in Treatment: 82 Active Problems Location of Pain  Severity and Description of Pain Patient Has Paino No Site Locations Rate the pain. Current Pain Level: 0 Pain Management and Medication Current Pain Management: Electronic Signature(s) Signed: 09/07/2022 4:20:35 PM By: Samuella Bruin Entered By: Samuella Bruin on 09/07/2022 08:32:18 -------------------------------------------------------------------------------- Patient/Caregiver Education Details Patient Name: Date of Service: [name] [name] 2/6/2024andnbsp8:30 [name] M Medical Record Number: [id number] Patient Account Number: [id number] Date of Birth/Gender: Treating RN: 07-31-1976 (47 y.o. [name] Primary Care Physician: PA Zenovia Jordan, [place] Other Clinician: Referring Physician: Treating Physician/Extender: Horton Marshall in Treatment: 79 Education Assessment Education Provided To: Patient Education Topics Provided Wound/Skin Impairment: Methods: Explain/Verbal Responses: Reinforcements needed, State content correctly Electronic Signature(s) [name], [name] ([id number]) [phone] Page 6 of 7 Signed: 09/07/2022 4:20:35 PM By: Gelene Mink By: Samuella Bruin on 09/07/2022 08:41:54 -------------------------------------------------------------------------------- Wound Assessment Details Patient Name: Date of Service: [name] [name] 09/07/2022 8:30 [name] M Medical Record Number: [id number] Patient Account Number: [id number] Date of Birth/Sex: Treating RN: [birth year] (47 y.o. [name] Primary Care Jeovanny Cuadros: PA Zenovia Jordan, NO Other Clinician: Referring Trenise Turay: Treating Mallarie Voorhies/Extender: Horton Marshall in Treatment: 82 Wound Status Wound Number: 1 Primary Etiology: Trauma, Other Wound Location: Left Ankle Wound Status: Open Wounding Event: Trauma Date Acquired: 10/14/2020 Weeks Of Treatment: 82 Clustered Wound: No Photos Wound Measurements Length: (cm)  1.2 Width: (cm) 1.8 Depth: (cm) 0.2 Area: (cm) 1.696 Volume: (cm) 0.339 % Reduction in Area: 97.2% % Reduction in Volume: 98.6% Epithelialization: Large (67-100%) Tunneling: No Undermining: No Wound Description Classification: Full Thickness Without Exposed Suppor Wound Margin: Flat and Intact Exudate Amount: Medium Exudate Type: Serosanguineous Exudate Color: red, brown t Structures Foul Odor After Cleansing: No Slough/Fibrino Yes Wound Bed Granulation Amount: Large (67-100%) Exposed Structure Granulation Quality: Red Fascia Exposed: No Necrotic Amount: Small (1-33%) Fat Layer (Subcutaneous Tissue) Exposed: Yes Necrotic Quality: Adherent Slough Tendon Exposed: No Muscle Exposed: No Joint Exposed: No Bone Exposed: No Periwound Skin Texture Texture Color No Abnormalities Noted: No No Abnormalities Noted: Yes Scarring: Yes Temperature / Pain Temperature: No Abnormality Moisture No Abnormalities Noted: No Dry / Scaly: No Maceration: No [name], [name] ([id number]) [id number] Page 7 of 7 Treatment Notes Wound #1 (Ankle) Wound Laterality: Left Cleanser Soap and Water Discharge Instruction: May shower and wash wound with dial antibacterial soap and water prior to dressing change. Wound Cleanser Discharge Instruction: Cleanse the wound with wound cleanser prior to applying [name] clean dressing using gauze sponges, not tissue or cotton balls. Peri-Wound Care Sween Lotion (Moisturizing lotion) Discharge Instruction: Apply moisturizing lotion as directed Topical Gentamicin Discharge Instruction: As directed by physician Primary Dressing Endoform 2x2 in Discharge Instruction: Moisten with saline Secondary Dressing Drawtex 4x4 in Discharge Instruction: Apply over primary dressing as directed. Zetuvit Plus 4x8 in Discharge Instruction: Apply over primary dressing as directed. Secured With Compression Wrap ThreePress (3 layer compression  wrap) Discharge Instruction: Apply three layer compression as directed. Compression Stockings Add-Ons Electronic Signature(s) Signed: 09/07/2022 4:20:35 PM By: Samuella Bruin Entered By: Samuella Bruin on 09/07/2022 08:36:50 -------------------------------------------------------------------------------- Vitals Details Patient Name: Date of Service: [name], [name] [name] 09/07/2022 8:30 [name] M Medical Record Number: [id number] Patient Account Number: [id number] Date of Birth/Sex: Treating RN:  05/13/1976 (47 y.o. [name] Primary Care Tavi Hoogendoorn: PA [name], NO Other Clinician: Referring Megen Madewell: Treating Bronc Brosseau/Extender: [name] in Treatment: 82 Vital Signs Time Taken: 08:23 Temperature (F): 98.4 Height (in): 69 Pulse (bpm): 60 Weight (lbs): 170 Respiratory Rate (breaths/min): 18 Body Mass Index (BMI): 25.1 Blood Pressure (mmHg): 130/74 Reference Range: 80 - 120 mg / dl Electronic Signature(s) Signed: 09/07/2022 4:20:35 PM By: [name] Entered By: [name] on 09/07/2022 08:27:09

## 2022-09-15 ENCOUNTER — Encounter (HOSPITAL_BASED_OUTPATIENT_CLINIC_OR_DEPARTMENT_OTHER): Payer: Medicaid Other | Admitting: General Surgery

## 2022-09-15 DIAGNOSIS — F17200 Nicotine dependence, unspecified, uncomplicated: Secondary | ICD-10-CM | POA: Diagnosis not present

## 2022-09-15 DIAGNOSIS — L97529 Non-pressure chronic ulcer of other part of left foot with unspecified severity: Secondary | ICD-10-CM | POA: Diagnosis not present

## 2022-09-15 DIAGNOSIS — M85872 Other specified disorders of bone density and structure, left ankle and foot: Secondary | ICD-10-CM | POA: Diagnosis not present

## 2022-09-15 DIAGNOSIS — L97322 Non-pressure chronic ulcer of left ankle with fat layer exposed: Secondary | ICD-10-CM | POA: Diagnosis not present

## 2022-09-15 DIAGNOSIS — L97328 Non-pressure chronic ulcer of left ankle with other specified severity: Secondary | ICD-10-CM | POA: Diagnosis not present

## 2022-09-15 DIAGNOSIS — I872 Venous insufficiency (chronic) (peripheral): Secondary | ICD-10-CM | POA: Diagnosis not present

## 2022-09-16 NOTE — Progress Notes (Signed)
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 wound is about the same size, but it is quite clean and the surface has more of [name] pink color. His employment has told him that they cannot make any further accommodations for him. 07/06/2022: The wound is smaller this week. There is [name] light layer of slough on the surface, but the drainage on his dressing has the typical blue-green color of Pseudomonas. 12/12; this is [name] patient to be admitted earlier in the year with an extensive wound across the left lateral ankle and into the anterior ankle. Initially an immigrant from the [place]. He speaks [place] leads and we interviewed him through an interpreter. He has been using endoform on the remanent of the wound. Intake nurse tells [place] that we have healed this out but it reopens. He is no longer working 07/21/2022: The wound is smaller today. There is slough on the surface, but good granulation tissue underlying. He is no longer working at the [employer]. 08/06/2022: The wound is [name] little bit smaller again today. There is thick slough on the surface, but the  surface underneath is less fibrotic. 08/31/2022: The patient has missed [name] couple of appointments. T oday, his foot and leg are completely macerated. He says that he has been using [name] plastic bag for showers and that it leaked. The wound is larger, has [name] thick layer of slough on the surface and is malodorous. 09/07/2022: The wound looks better this week. It is smaller  with some slough on the surface. [name] small satellite wound has opened up just proximal to the main site. 09/15/2022: The wound has [name] lot more slough on it this week and the satellite wound is larger. He says that he has been standing quite [name] bit at work. Patient History Information obtained from Patient. Family History Unknown History. [name], [name] ([id number]) [id number] Page 10 of 12 Social History Current every day smoker, Marital Status - Married, Alcohol Use - Rarely, Drug Use - No History, Caffeine Use - Moderate. Medical [name] Surgical History Notes nd Gastrointestinal Chronic Gastritis Objective Constitutional Slightly hypertensive. no acute distress. Vitals Time Taken: 8:39 AM, Height: 69 in, Weight: 170 lbs, BMI: 25.1, Temperature: 97.6 F, Pulse: 73 bpm, Respiratory Rate: 16 breaths/min, Blood Pressure: 145/69 mmHg. Respiratory Normal work of breathing on room air. General Notes: 09/15/2022: The wound has [name] lot more slough on it this week and the satellite wound is larger. Integumentary (Hair, Skin) Wound #1 status is Open. Original cause of wound was Trauma. The date acquired was: 10/14/2020. The wound has been in treatment 83 weeks. The wound is located on the Left Ankle. The wound measures 2.9cm length x 1.9cm width x 0.2cm depth; 4.328cm^2 area and 0.866cm^3 volume. There is Fat Layer (Subcutaneous Tissue) exposed. There is no tunneling or undermining noted. There is [name] medium amount of serosanguineous drainage noted. The wound margin is flat and intact. There is small (1-33%) red granulation within the wound bed. There is [name] large (67-100%) amount of necrotic tissue within the wound bed including Adherent Slough. The periwound skin appearance had no abnormalities noted for color. The periwound skin appearance exhibited: Scarring. The periwound skin appearance did not exhibit: Dry/Scaly, Maceration. Periwound temperature was noted as No  Abnormality. Assessment Active Problems ICD-10 Non-pressure chronic ulcer of left ankle with other specified severity Chronic venous hypertension (idiopathic) with ulcer and inflammation of left lower extremity Procedures Wound #1 Pre-procedure diagnosis of Wound #1 is [name] Trauma, Other located on the Left Ankle . There was [name] Selective/Open Wound Non-Viable Tissue Debridement with [name] total area of 5.51 sq cm performed by [name] Maudlin, MD. With the following instrument(s): Curette to remove Non-Viable tissue/material. Material removed includes Eschar and Slough and after achieving pain control using Lidocaine 4% Topical Solution. No specimens were taken. [name] time out was conducted at 09:01, prior to the start of the procedure. [name] Minimum amount of bleeding was controlled with Pressure. The procedure was tolerated well. Post Debridement Measurements: 2.9cm length x 1.9cm width x 0.2cm depth; 0.866cm^3 volume. Character of Wound/Ulcer Post Debridement is improved. Post procedure Diagnosis Wound #1: Same as Pre-Procedure General Notes: scribed for Dr. Celine [name] by [name] Peals, RN. Pre-procedure diagnosis of Wound #1 is [name] Trauma, Other located on the Left Ankle . There was [name] Three Layer Compression Therapy Procedure by [name] Peals, RN. Post procedure Diagnosis Wound #1: Same as Pre-Procedure Plan Follow-up Appointments: Return Appointment in 1 week. - Dr. Celine [name] - Room 2 Other: - [place] interpreter required. Anesthetic: (In clinic) Topical Lidocaine 4% applied to wound bed - Used in Clinic Bathing/ Shower/ Hygiene: Other Bathing/Shower/Hygiene  Orders/Instructions: - Please use [name] " [employer]ST [employer]OR" when showering. This can be purchased from [employer], [place] [employer]. Cost is approximately $14-27. Berton, [name] ([id number]) [id number] Page 11 of 12 Edema Control - Lymphedema / SCD / Other: Avoid standing for long periods of  time. Exercise regularly Off-Loading: Open toe surgical shoe to: - left foot The following medication(s) was prescribed: lidocaine topical 4 % cream cream topical was prescribed at facility WOUND #1: - Ankle Wound Laterality: Left Cleanser: Soap and Water 1 x Per Week/30 Days Discharge Instructions: May shower and wash wound with dial antibacterial soap and water prior to dressing change. Cleanser: Wound Cleanser 1 x Per Week/30 Days Discharge Instructions: Cleanse the wound with wound cleanser prior to applying [name] clean dressing using gauze sponges, not tissue or cotton balls. Peri-Wound Care: Sween Lotion (Moisturizing lotion) 1 x Per Week/30 Days Discharge Instructions: Apply moisturizing lotion as directed Topical: Gentamicin 1 x Per Week/30 Days Discharge Instructions: As directed by physician Prim Dressing: Endoform 2x2 in 1 x Per Week/30 Days ary Discharge Instructions: Moisten with saline Secondary Dressing: Drawtex 4x4 in 1 x Per Week/30 Days Discharge Instructions: Apply over primary dressing as directed. Secondary Dressing: Zetuvit Plus 4x8 in 1 x Per Week/30 Days Discharge Instructions: Apply over primary dressing as directed. Com pression Wrap: ThreePress (3 layer compression wrap) 1 x Per Week/30 Days Discharge Instructions: Apply three layer compression as directed. Com pression Stockings: Compression Stocking Compression Amount: 30-40 mmHg (left) Compression Amount: 30-40 mmHg (right) Discharge Instructions: Apply Compression Stockings daily as instructed. Apply first thing in the morning, remove at night before bed. 09/15/2022: The wound has [name] lot more slough on it this week and the satellite wound is larger. I used [name] curette to debride slough and eschar from the wound. We will continue topical gentamicin with endoform, drawtex packing, and 3 layer compression. We have written him yet another note for work limiting the amount of time that he stands stationary; I am not  sure what else we can do to try and alleviate the work conditions that are detrimental to his wound healing. Electronic Signature(s) Signed: 09/15/2022 9:39:01 AM By: [name] Maudlin MD FACS Entered By: [name] on 09/15/2022 09:39:01 -------------------------------------------------------------------------------- HxROS Details Patient Name: Date of Service: [name] NA [place] 09/15/2022 8:30 [name] M Medical Record Number: [id number] Patient Account Number: [id number] Date of Birth/Sex: Treating RN: February 25, 1976 (47 y.o. M) Primary Care Provider: PA [name], NO Other Clinician: Referring Provider: Treating Provider/Extender: [name] in Treatment: 83 Information Obtained From Patient Gastrointestinal Medical History: Past Medical History Notes: Chronic Gastritis Immunizations Pneumococcal Vaccine: Received Pneumococcal Vaccination: No Implantable Devices No devices added Family and Social History Unknown History: Yes; Current every day smoker; Marital Status - Married; Alcohol Use: Rarely; Drug Use: No History; Caffeine Use: Moderate; Financial Concerns: No; Food, Clothing or Shelter Needs: No; Support System Lacking: No; Transportation Concerns: No [name], [name] ([id number]) [phone].pdf Page 12 of 12 Electronic Signature(s) Signed: 09/15/2022 10:47:57 AM By: [name] Maudlin MD FACS Entered By: [name] on 09/15/2022 09:37:09 -------------------------------------------------------------------------------- SuperBill Details Patient Name: Date of Service: [name] [name] 09/15/2022 Medical Record Number: [id number] Patient Account Number: [id number] Date of Birth/Sex: Treating RN: 09/08/1975 (47 y.o. M) Primary Care Provider: PA [name], NO Other Clinician: Referring Provider: Treating Provider/Extender: [name] in Treatment: 83 Diagnosis Coding ICD-10 Codes Code  Description L97.328 Non-pressure chronic ulcer of left ankle with other specified severity I87.332 Chronic venous hypertension (idiopathic) with  ulcer and inflammation of left lower extremity Facility Procedures : CPT4 Code: [id number] Description: [id number] - DEBRIDE WOUND 1ST 20 SQ CM OR < ICD-10 Diagnosis Description L97.328 Non-pressure chronic ulcer of left ankle with other specified severity Modifier: Quantity: 1 Physician Procedures : CPT4 Code Description Modifier [id number] - WC PHYS LEVEL 4 - EST PT 25 ICD-10 Diagnosis Description L97.328 Non-pressure chronic ulcer of left ankle with other specified severity I87.332 Chronic venous hypertension (idiopathic) with ulcer and  inflammation of left lower extremity Quantity: 1 : [id number] - WC PHYS DEBR WO ANESTH 20 SQ CM ICD-10 Diagnosis Description L97.328 Non-pressure chronic ulcer of left ankle with other specified severity Quantity: 1 Electronic Signature(s) Signed: 09/15/2022 9:39:18 AM By: [name] Maudlin MD FACS Entered By: [name] on 09/15/2022 09:39:18

## 2022-09-16 NOTE — Progress Notes (Signed)
[name], [name] ([id number]) [id number] Page 1 of 8 Visit Report for 09/15/2022 Arrival Information Details Patient Name: Date of Service: [name] [place] NIE 09/15/2022 8:30 A M Medical Record Number: [id number] Patient Account Number: [id number] Date of Birth/Sex: Treating RN: 27-Dec-1975 (47 y.o. [name] Primary Care Mikiyah Glasner: PA [name], NO Other Clinician: Referring Clinten Howk: Treating Aubrielle Stroud/Extender: [name] in Treatment: 47 Visit Information History Since Last Visit Added or deleted any medications: No Patient Arrived: Ambulatory Any new allergies or adverse reactions: No Arrival Time: 08:38 Had a fall or experienced change in No Accompanied By: self activities of daily living that may affect Transfer Assistance: None risk of falls: Patient Identification Verified: Yes Signs or symptoms of abuse/neglect since last visito No Secondary Verification Process Completed: Yes Hospitalized since last visit: No Patient Requires Transmission-Based Precautions: No Implantable device outside of the clinic excluding No Patient Has Alerts: No cellular tissue based products placed in the center since last visit: Has Dressing in Place as Prescribed: Yes Has Compression in Place as Prescribed: Yes Pain Present Now: No Electronic Signature(s) Signed: 09/15/2022 4:47:07 PM By: [name] Entered By: [name] on 09/15/2022 08:38:51 -------------------------------------------------------------------------------- Compression Therapy Details Patient Name: Date of Service: [name] NA [place] 09/15/2022 8:30 A M Medical Record Number: [id number] Patient Account Number: [id number] Date of Birth/Sex: Treating RN: [birth year] (47 y.o. [name] Primary Care Camylle Whicker: PA [name], NO Other Clinician: Referring Christain Niznik: Treating Jaley Yan/Extender: [name] in  Treatment: 83 Compression Therapy Performed for Wound Assessment: Wound #1 Left Ankle Performed By: Clinician [name], RN Compression Type: Three Layer Post Procedure Diagnosis Same as Pre-procedure Electronic Signature(s) Signed: 09/15/2022 4:47:07 PM By: [name] Entered By: [name] on 09/15/2022 09:03:36 [name], [name] ([id number] Page 2 of 8 -------------------------------------------------------------------------------- Encounter Discharge Information Details Patient Name: Date of Service: [name] 09/15/2022 8:30 A M Medical Record Number: [id number] Patient Account Number: [id number] Date of Birth/Sex: Treating RN: 05-18-1976 (47 y.o. [name] Primary Care Cate Oravec: PA [name], NO Other Clinician: Referring Arna Luis: Treating Dayanna Pryce/Extender: [name] in Treatment: 49 Encounter Discharge Information Items Post Procedure Vitals Discharge Condition: Stable Temperature (F): 97.6 Ambulatory Status: Ambulatory Pulse (bpm): 73 Discharge Destination: Home Respiratory Rate (breaths/min): 16 Transportation: Private Auto Blood Pressure (mmHg): 145/69 Accompanied By: self Schedule Follow-up Appointment: Yes Clinical Summary of Care: Patient Declined Electronic Signature(s) Signed: 09/15/2022 4:47:07 PM By: [name] Entered By: [name] on 09/15/2022 09:30:39 -------------------------------------------------------------------------------- Lower Extremity Assessment Details Patient Name: Date of Service: [name] 09/15/2022 8:30 A M Medical Record Number: [id number] Patient Account Number: [id number] Date of Birth/Sex: Treating RN: 12/19/1975 (47 y.o. [name] Primary Care Maleeya Peterkin: PA [name], NO Other Clinician: Referring Rogena Deupree: Treating Teron Blais/Extender: [name] in Treatment:  83 Edema Assessment Assessed: [Left: No] [Right: No] Edema: [Left: Ye] [Right: s] Calf Left: Right: Point of Measurement: 28 cm From Medial Instep 29.3 cm Ankle Left: Right: Point of Measurement: 8 cm From Medial Instep 21 cm Vascular Assessment Pulses: Dorsalis Pedis Palpable: [Left:Yes] Electronic Signature(s) Signed: 09/15/2022 4:47:07 PM By: [name] Entered By: [name] on 09/15/2022 08:43:29 Multi Wound Chart Details -------------------------------------------------------------------------------- [name], [name] ([id number].pdf Page 3 of 8 Patient Name: Date of Service: [name] NA [place] 09/15/2022 8:30 A M Medical Record Number: [id number] Patient Account Number: [id number] Date of Birth/Sex: Treating RN: [birth year] (47 y.o. M) Primary Care Randell Detter: PA [place], [place]  Other Clinician: Referring Kynsley Whitehouse: Treating Geordie Nooney/Extender: [name] in Treatment: 83 Vital Signs Height(in): 69 Pulse(bpm): 73 Weight(lbs): 170 Blood Pressure(mmHg): 145/69 Body Mass Index(BMI): 25.1 Temperature(F): 97.6 Respiratory Rate(breaths/min): 16 Wound Assessments Wound Number: 1 N/A N/A Photos: N/A N/A Left Ankle N/A N/A Wound Location: Trauma N/A N/A Wounding Event: Trauma, Other N/A N/A Primary Etiology: 10/14/2020 N/A N/A Date Acquired: 41 N/A N/A Weeks of Treatment: Open N/A N/A Wound Status: No N/A N/A Wound Recurrence: 2.9x1.9x0.2 N/A N/A Measurements L x W x D (cm) 4.328 N/A N/A A (cm) : rea 0.866 N/A N/A Volume (cm) : 93.00% N/A N/A % Reduction in A rea: 96.50% N/A N/A % Reduction in Volume: Full Thickness Without Exposed N/A N/A Classification: Support Structures Medium N/A N/A Exudate A mount: Serosanguineous N/A N/A Exudate Type: red, brown N/A N/A Exudate Color: Flat and Intact N/A N/A Wound Margin: Small (1-33%) N/A N/A Granulation A mount: Red N/A  N/A Granulation Quality: Large (67-100%) N/A N/A Necrotic A mount: Fat Layer (Subcutaneous Tissue): Yes N/A N/A Exposed Structures: Fascia: No Tendon: No Muscle: No Joint: No Bone: No Large (67-100%) N/A N/A Epithelialization: Debridement - Selective/Open Wound N/A N/A Debridement: Pre-procedure Verification/Time Out 09:01 N/A N/A Taken: Lidocaine 4% Topical Solution N/A N/A Pain Control: Necrotic/Eschar, Slough N/A N/A Tissue Debrided: Non-Viable Tissue N/A N/A Level: 5.51 N/A N/A Debridement A (sq cm): rea Curette N/A N/A Instrument: Minimum N/A N/A Bleeding: Pressure N/A N/A Hemostasis A chieved: Procedure was tolerated well N/A N/A Debridement Treatment Response: 2.9x1.9x0.2 N/A N/A Post Debridement Measurements L x W x D (cm) 0.866 N/A N/A Post Debridement Volume: (cm) Scarring: Yes N/A N/A Periwound Skin Texture: Maceration: No N/A N/A Periwound Skin Moisture: Dry/Scaly: No No Abnormalities Noted N/A N/A Periwound Skin Color: No Abnormality N/A N/A Temperature: Compression Therapy N/A N/A Procedures Performed: Debridement Treatment Notes Wound #1 (Ankle) Wound Laterality: Left Cleanser Perotti, [name] ([id number] Page 4 of 8 Soap and Water Discharge Instruction: May shower and wash wound with dial antibacterial soap and water prior to dressing change. Wound Cleanser Discharge Instruction: Cleanse the wound with wound cleanser prior to applying a clean dressing using gauze sponges, not tissue or cotton balls. Peri-Wound Care Sween Lotion (Moisturizing lotion) Discharge Instruction: Apply moisturizing lotion as directed Topical Gentamicin Discharge Instruction: As directed by physician Primary Dressing Endoform 2x2 in Discharge Instruction: Moisten with saline Secondary Dressing Drawtex 4x4 in Discharge Instruction: Apply over primary dressing as directed. Zetuvit Plus 4x8 in Discharge Instruction:  Apply over primary dressing as directed. Secured With Compression Wrap ThreePress (3 layer compression wrap) Discharge Instruction: Apply three layer compression as directed. Compression Stockings Compression Stocking Quantity: 1 Left Leg Compression Amount: 30-40 mmHg Right Leg Compression Amount: 30-40 mmHg Discharge Instruction: Apply Compression Stockings daily as instructed. Apply first thing in the morning, remove at night before bed. Add-Ons Electronic Signature(s) Signed: 09/15/2022 9:31:15 AM By: [name] MD FACS Entered By: [name] on 09/15/2022 09:31:14 -------------------------------------------------------------------------------- Multi-Disciplinary Care Plan Details Patient Name: Date of Service: [name] 09/15/2022 8:30 A M Medical Record Number: [id number] Patient Account Number: [id number] Date of Birth/Sex: Treating RN: [birth year] (47 y.o. 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 Compression as ordered Treatment Activities: Therapeutic compression applied : 02/19/2022 Notes: Wound/Skin Impairment Nursing Diagnoses: Knowledge deficit related to ulceration/compromised skin integrity Goals: Patient/caregiver will verbalize understanding of skin care regimen Date Initiated: 02/05/2021 Target Resolution Date:  10/30/2022 Goal Status: Active Interventions: Assess patient/caregiver ability to obtain necessary supplies Assess patient/caregiver ability to perform ulcer/skin care regimen upon admission and as needed Provide education on ulcer and skin care Treatment Activities: Skin care regimen initiated : 02/05/2021 Topical wound management initiated : 02/05/2021 Notes: 03/31/21: Wound care regimen ongoing, target date extended. 04/21/21: Wound care ongoing, through interpreter patient states he is doing fine with his dressing changes. Electronic Signature(s) Signed: 09/15/2022 4:47:07 PM By: [name] Entered By: [name] on 09/15/2022 09:02:38 -------------------------------------------------------------------------------- Pain Assessment Details Patient Name: Date of Service: [name] NA [place] 09/15/2022 8:30 A M Medical Record Number: [id number] Patient Account Number: [id number] Date of Birth/Sex: Treating RN: 24-Sep-1975 (47 y.o. [name] Primary Care Mineola Duan: PA [name], NO Other Clinician: Referring Mayson Mcneish: Treating Valine Drozdowski/Extender: [name] in Treatment: 83 Active Problems Location of Pain Severity and Description of Pain Patient Has Paino No Site Locations Rate the pain. Current Pain Level: 0 [name], [name] ([id number]) [id number].pdf Page 6 of 8 Pain Management and Medication Current Pain Management: Electronic Signature(s) Signed: 09/15/2022 4:47:07 PM By: [name] Entered By: [name] on 09/15/2022 08:39:00 -------------------------------------------------------------------------------- Patient/Caregiver Education Details Patient Name: Date of Service: [name] 2/14/2024andnbsp8:30 [place] Record Number: [id number] Patient Account Number: [id number] Date of Birth/Gender: Treating RN: [birth year] (47 y.o. [name] Primary Care Physician: PA  [name], [place] Other Clinician: Referring Physician: Treating Physician/Extender: [name] in Treatment: 47 Education Assessment Education Provided To: Patient Education Topics Provided Wound/Skin Impairment: Methods: Explain/Verbal Responses: Reinforcements needed, State content correctly Electronic Signature(s) Signed: 09/15/2022 4:47:07 PM By: [name] Entered By: [name] on 09/15/2022 09:02:50 -------------------------------------------------------------------------------- Wound Assessment Details Patient Name: Date of Service: [name] NA [place] 09/15/2022 8:30 A M Medical Record Number: [id number] Patient Account Number: [id number] Date of Birth/Sex: Treating RN: [birth year] (47 y.o. [name] Primary Care Jacquelyn Antony: PA [name], NO Other Clinician: Referring Lynn Sissel: Treating Myranda Pavone/Extender: [name] in Treatment: 39 Wound Status Wound Number: 1 Primary Etiology: Trauma, Other Wound Location: Left Ankle Wound Status: Open Wounding Event: Trauma Date Acquired: 10/14/2020 Weeks Of Treatment: 83 Clustered Wound: No Photos Clear, [name] ([id number] Page 7 of 8 Wound Measurements Length: (cm) 2.9 Width: (cm) 1.9 Depth: (cm) 0.2 Area: (cm) 4.328 Volume: (cm) 0.866 % Reduction in Area: 93% % Reduction in Volume: 96.5% Epithelialization: Large (67-100%) Tunneling: No Undermining: No Wound Description Classification: Full Thickness Without Exposed Support Structures Wound Margin: Flat and Intact Exudate Amount: Medium Exudate Type: Serosanguineous Exudate Color: red, brown Foul Odor After Cleansing: No Slough/Fibrino Yes Wound Bed Granulation Amount: Small (1-33%) Exposed Structure Granulation Quality: Red Fascia Exposed: No Necrotic Amount: Large (67-100%) Fat Layer (Subcutaneous Tissue) Exposed: Yes Necrotic Quality: Adherent  Slough Tendon Exposed: No Muscle Exposed: No Joint Exposed: No Bone Exposed: No Periwound Skin Texture Texture Color No Abnormalities Noted: No No Abnormalities Noted: Yes Scarring: Yes Temperature / Pain Temperature: No Abnormality Moisture No Abnormalities Noted: No Dry / Scaly: No Maceration: No Treatment Notes Wound #1 (Ankle) Wound Laterality: Left Cleanser Soap and Water Discharge  Instruction: May shower and wash wound with dial antibacterial soap and water prior to dressing change. Wound Cleanser Discharge Instruction: Cleanse the wound with wound cleanser prior to applying a clean dressing using gauze sponges, not tissue or cotton balls. Peri-Wound Care Sween Lotion (Moisturizing lotion) Discharge Instruction: Apply moisturizing lotion as directed Topical Gentamicin Discharge Instruction: As directed by physician Primary Dressing Endoform 2x2 in Discharge Instruction: Moisten with saline Secondary Dressing Drawtex 4x4 in Discharge Instruction: Apply over primary dressing as directed. Zetuvit Plus 4x8 in Discharge Instruction: Apply over primary dressing as directed. [name], [name] ([id number]) [id number] Page 8 of 8 Secured With Compression Wrap ThreePress (3 layer compression wrap) Discharge Instruction: Apply three layer compression as directed. Compression Stockings Compression Stocking Quantity: 1 Left Leg Compression Amount: 30-40 mmHg Right Leg Compression Amount: 30-40 mmHg Discharge Instruction: Apply Compression Stockings daily as instructed. Apply first thing in the morning, remove at night before bed. Add-Ons Electronic Signature(s) Signed: 09/15/2022 4:47:07 PM By: [name] Entered By: [name] on 09/15/2022 08:47:19 -------------------------------------------------------------------------------- [place] Details Patient Name: Date of Service: [name], [name] [place] 09/15/2022 8:30 A M Medical  Record Number: [id number] Patient Account Number: [id number] Date of Birth/Sex: Treating RN: 08-31-1975 (47 y.o. [name] Primary Care Ramie Palladino: PA [name], NO Other Clinician: Referring Shwanda Soltis: Treating Keyvon Herter/Extender: [name] in Treatment: 32 Vital Signs Time Taken: 08:39 Temperature (F): 97.6 Height (in): 69 Pulse (bpm): 73 Weight (lbs): 170 Respiratory Rate (breaths/min): 16 Body Mass Index (BMI): 25.1 Blood Pressure (mmHg): 145/69 Reference Range: 80 - 120 mg / dl Electronic Signature(s) Signed: 09/15/2022 4:47:07 PM By: [name] Entered By: [name] on 09/15/2022 08:40:04

## 2022-09-17 ENCOUNTER — Other Ambulatory Visit: Payer: Medicaid Other | Admitting: *Deleted

## 2022-09-17 NOTE — Patient Instructions (Signed)
Visit Information  [name]  - as a part of your Medicaid benefit, you are eligible for care management and care coordination services at no cost or copay. I was unable to reach you by phone today but would be happy to help you with your health related needs. Please feel free to call me @ [phone].   A member of the Managed Medicaid care management team will reach out to you again over the next 7 days.   [name] RN, BSN [place]  [employer] manager

## 2022-09-17 NOTE — Patient Outreach (Signed)
  Medicaid Managed Care   Unsuccessful Attempt Note   09/17/2022 Name: [name] MRN: [id number] DOB: [birth year]  Referred by: Patient, No Pcp Per Reason for referral : High Risk Managed Medicaid (Unsuccessful RNCM follow up telephone outreach)   An unsuccessful telephone outreach was attempted today. The patient was referred to the case management team for assistance with care management and care coordination.    Follow Up Plan: The Managed Medicaid care management team will reach out to the patient again over the next 7 days.    [name] RN, BSN [place]  [employer] manager

## 2022-09-22 ENCOUNTER — Encounter (HOSPITAL_BASED_OUTPATIENT_CLINIC_OR_DEPARTMENT_OTHER): Payer: Medicaid Other | Admitting: General Surgery

## 2022-09-22 DIAGNOSIS — F17200 Nicotine dependence, unspecified, uncomplicated: Secondary | ICD-10-CM | POA: Diagnosis not present

## 2022-09-22 DIAGNOSIS — S91002A Unspecified open wound, left ankle, initial encounter: Secondary | ICD-10-CM | POA: Diagnosis not present

## 2022-09-22 DIAGNOSIS — I872 Venous insufficiency (chronic) (peripheral): Secondary | ICD-10-CM | POA: Diagnosis not present

## 2022-09-22 DIAGNOSIS — L97529 Non-pressure chronic ulcer of other part of left foot with unspecified severity: Secondary | ICD-10-CM | POA: Diagnosis not present

## 2022-09-22 DIAGNOSIS — L97328 Non-pressure chronic ulcer of left ankle with other specified severity: Secondary | ICD-10-CM | POA: Diagnosis not present

## 2022-09-22 DIAGNOSIS — M85872 Other specified disorders of bone density and structure, left ankle and foot: Secondary | ICD-10-CM | POA: Diagnosis not present

## 2022-09-23 NOTE — Progress Notes (Signed)
[name], [name] ([id number]) [id number] Page 1 of 7 Visit Report for 09/22/2022 Arrival Information Details Patient Name: Date of Service: [name] 09/22/2022 8:30 A M Medical Record Number: [id number] Patient Account Number: [id number] Date of Birth/Sex: Treating RN: [birth year] (46 y.o. [name] Primary Care Tymir Terral: [place] [name], [place] Other Clinician: Referring Modean Mccullum: Treating Savannah Morford/Extender: [name] in Treatment: 66 Visit Information History Since Last Visit Added or deleted any medications: No Patient Arrived: Ambulatory Any new allergies or adverse reactions: No Arrival Time: 08:35 Had a fall or experienced change in No Accompanied By: translator activities of daily living that may affect Transfer Assistance: None risk of falls: Patient Identification Verified: Yes Signs or symptoms of abuse/neglect since last visito No Secondary Verification Process Completed: Yes Hospitalized since last visit: No Patient Requires Transmission-Based Precautions: No Implantable device outside of the clinic excluding No Patient Has Alerts: No cellular tissue based products placed in the center since last visit: Has Dressing in Place as Prescribed: Yes Has Compression in Place as Prescribed: Yes Pain Present Now: Yes Electronic Signature(s) Signed: 09/22/2022 4:21:22 PM By: [name] Entered By: [name] on 09/22/2022 08:37:07 -------------------------------------------------------------------------------- Compression Therapy Details Patient Name: Date of Service: [name] 09/22/2022 8:30 A M Medical Record Number: [id number] Patient Account Number: [id number] Date of Birth/Sex: Treating RN: 1975-10-19 (47 y.o. [name] Primary Care Izabela Ow: PA [name], [place] Other Clinician: Referring Janeese Mcgloin: Treating Iosefa Weintraub/Extender: [name] in  Treatment: 84 Compression Therapy Performed for Wound Assessment: Wound #1 Left Ankle Performed By: Clinician [name], RN Compression Type: Three Layer Post Procedure Diagnosis Same as Pre-procedure Electronic Signature(s) Signed: 09/22/2022 4:21:22 PM By: [name] Entered By: [name] on 09/22/2022 08:51:38 [name], [name] ([id number].pdf Page 2 of 7 -------------------------------------------------------------------------------- Encounter Discharge Information Details Patient Name: Date of Service: [name] 09/22/2022 8:30 A M Medical Record Number: [id number] Patient Account Number: [id number] Date of Birth/Sex: Treating RN: 01-18-1976 (47 y.o. [name] Primary Care Grantham Hippert: PA [name], [place] Other Clinician: Referring Jasmina Gendron: Treating Tonimarie Gritz/Extender: [name] in Treatment: 50 Encounter Discharge Information Items Post Procedure Vitals Discharge Condition: Stable Temperature (F): 97.8 Ambulatory Status: Ambulatory Pulse (bpm): 112 Discharge Destination: Home Respiratory Rate (breaths/min): 16 Transportation: Private Auto Blood Pressure (mmHg): 117/63 Accompanied By: self Schedule Follow-up Appointment: Yes Clinical Summary of Care: Patient Declined Electronic Signature(s) Signed: 09/22/2022 4:21:22 PM By: [name] Entered By: [name] on 09/22/2022 09:07:02 -------------------------------------------------------------------------------- Lower Extremity Assessment Details Patient Name: Date of Service: [name] 09/22/2022 8:30 A M Medical Record Number: [id number] Patient Account Number: [id number] Date of Birth/Sex: Treating RN: 1976/02/11 (47 y.o. [name] Primary Care Adalena Abdulla: PA [name], NO Other Clinician: Referring Anselma Herbel: Treating Kariya Lavergne/Extender: [name] in  Treatment: 84 Edema Assessment Assessed: [Left: No] [Right: No] Edema: [Left: Ye] [Right: s] Calf Left: Right: Point of Measurement: 28 cm From Medial Instep 29.5 cm Ankle Left: Right: Point of Measurement: 8 cm From Medial Instep 21.8 cm Vascular Assessment Pulses: Dorsalis Pedis Palpable: [Left:Yes] Electronic Signature(s) Signed: 09/22/2022 4:21:22 PM By: [name] Entered By: [name] on 09/22/2022 08:41:44 Multi Wound Chart Details -------------------------------------------------------------------------------- Parks, [name] ([id number].pdf Page 3 of 7 Patient Name: Date of Service: [name] NA [place] 09/22/2022 8:30 A M Medical Record Number: [id number] Patient Account Number: [id number] Date of Birth/Sex: Treating RN: April 03, 1976 (47 y.o. M) Primary Care Chinara Hertzberg: PA [place], [place]  Other Clinician: Referring Jarrid Lienhard: Treating Javares Kaufhold/Extender: [name] in Treatment: 84 Vital Signs Height(in): 69 Pulse(bpm): 112 Weight(lbs): 170 Blood Pressure(mmHg): 117/63 Body Mass Index(BMI): 25.1 Temperature(F): 97.8 Respiratory Rate(breaths/min): 16 Wound Assessments Wound Number: 1 N/A N/A Photos: No Photos N/A N/A Left Ankle N/A N/A Wound Location: Trauma N/A N/A Wounding Event: Trauma, Other N/A N/A Primary Etiology: 10/14/2020 N/A N/A Date Acquired: 73 N/A N/A Weeks of Treatment: Open N/A N/A Wound Status: No N/A N/A Wound Recurrence: 2.5x2.7x0.2 N/A N/A Measurements L x W x D (cm) 5.301 N/A N/A A (cm) : rea 1.06 N/A N/A Volume (cm) : 91.40% N/A N/A % Reduction in A rea: 95.70% N/A N/A % Reduction in Volume: Full Thickness Without Exposed N/A N/A Classification: Support Structures Medium N/A N/A Exudate A mount: Serosanguineous N/A N/A Exudate Type: red, brown N/A N/A Exudate Color: Flat and Intact N/A N/A Wound Margin: Large (67-100%) N/A N/A Granulation A  mount: Red N/A N/A Granulation Quality: Small (1-33%) N/A N/A Necrotic A mount: Fat Layer (Subcutaneous Tissue): Yes N/A N/A Exposed Structures: Fascia: No Tendon: No Muscle: No Joint: No Bone: No Large (67-100%) N/A N/A Epithelialization: Debridement - Selective/Open Wound N/A N/A Debridement: Pre-procedure Verification/Time Out 08:49 N/A N/A Taken: Lidocaine 4% Topical Solution N/A N/A Pain Control: Necrotic/Eschar, Slough N/A N/A Tissue Debrided: Non-Viable Tissue N/A N/A Level: 6.75 N/A N/A Debridement A (sq cm): rea Curette N/A N/A Instrument: Minimum N/A N/A Bleeding: Pressure N/A N/A Hemostasis A chieved: Procedure was tolerated well N/A N/A Debridement Treatment Response: 2.5x2.7x0.2 N/A N/A Post Debridement Measurements L x W x D (cm) 1.06 N/A N/A Post Debridement Volume: (cm) Scarring: Yes N/A N/A Periwound Skin Texture: Maceration: No N/A N/A Periwound Skin Moisture: Dry/Scaly: No No Abnormalities Noted N/A N/A Periwound Skin Color: No Abnormality N/A N/A Temperature: Compression Therapy N/A N/A Procedures Performed: Debridement Treatment Notes Electronic Signature(s) Signed: 09/22/2022 8:54:40 AM By: [name] MD FACS Entered By: [name] on 09/22/2022 08:54:40 [name], [name] ([id number].pdf Page 4 of 7 -------------------------------------------------------------------------------- Multi-Disciplinary Care Plan Details Patient Name: Date of Service: [name] 09/22/2022 8:30 A M Medical Record Number: [id number] Patient Account Number: [id number] Date of Birth/Sex: Treating RN: 1976/06/14 (47 y.o. [name] Primary Care Jaydan Meidinger: PA [name], NO Other Clinician: Referring Tanaya Dunigan: Treating Melchor Kirchgessner/Extender: [name] in Treatment: 78 Multidisciplinary Care Plan reviewed with physician Active Inactive Venous Leg Ulcer Nursing  Diagnoses: Actual venous Insuffiency (use after diagnosis is confirmed) Knowledge deficit related to disease process and management Goals: Patient will maintain optimal edema control Date Initiated: 02/19/2022 Target Resolution Date: 10/30/2022 Goal Status: Active Interventions: Assess peripheral edema status every visit. Compression as ordered Treatment Activities: Therapeutic compression applied : 02/19/2022 Notes: Wound/Skin Impairment Nursing Diagnoses: Knowledge deficit related to ulceration/compromised skin integrity Goals: Patient/caregiver will verbalize understanding of skin care regimen Date Initiated: 02/05/2021 Target Resolution Date: 10/30/2022 Goal Status: Active Interventions: Assess patient/caregiver ability to obtain necessary supplies Assess patient/caregiver ability to perform ulcer/skin care regimen upon admission and as needed Provide education on ulcer and skin care Treatment Activities: Skin care regimen initiated : 02/05/2021 Topical wound management initiated : 02/05/2021 Notes: 03/31/21: Wound care regimen ongoing, target date extended. 04/21/21: Wound care ongoing, through interpreter patient states he is doing fine with his dressing changes. Electronic Signature(s) Signed: 09/22/2022 4:21:22 PM By: [name] Entered By: [name] on 09/22/2022 08:51:51 Pain Assessment Details -------------------------------------------------------------------------------- [name], [name] ([id number].pdf Page 5 of 7 Patient Name: Date of Service: [name]  [name] [name] 09/22/2022 8:30 A M Medical Record Number: [id number] Patient Account Number: [id number] Date of Birth/Sex: Treating RN: Aug 05, 1975 (47 y.o. [name] Primary Care Lamonica Trueba: PA [name], NO Other Clinician: Referring Eiman Maret: Treating Tenya Araque/Extender: [name] in Treatment: 64 Active Problems Location of Pain  Severity and Description of Pain Patient Has Paino Yes Site Locations Pain Location: Pain in Ulcers Rate the pain. Current Pain Level: 2 Pain Management and Medication Current Pain Management: Electronic Signature(s) Signed: 09/22/2022 4:21:22 PM By: [name] Entered By: [name] on 09/22/2022 08:37:19 -------------------------------------------------------------------------------- Patient/Caregiver Education Details Patient Name: Date of Service: [name] 2/21/2024andnbsp8:30 A M Medical Record Number: [id number] Patient Account Number: [id number] Date of Birth/Gender: Treating RN: 10-Nov-1975 (47 y.o. [name] Primary Care Physician: PA [name], [place] Other Clinician: Referring Physician: Treating Physician/Extender: [name] in Treatment: 67 Education Assessment Education Provided To: Patient Education Topics Provided Wound/Skin Impairment: Methods: Explain/Verbal Responses: Reinforcements needed, State content correctly Electronic Signature(s) Signed: 09/22/2022 4:21:22 PM By: [name] Entered By: [name] on 09/22/2022 08:52:03 [name], [name] ([id number].pdf Page 6 of 7 -------------------------------------------------------------------------------- Wound Assessment Details Patient Name: Date of Service: [name] 09/22/2022 8:30 A M Medical Record Number: [id number] Patient Account Number: [id number] Date of Birth/Sex: Treating RN: 10/07/1975 (47 y.o. [name] Primary Care Georgette Helmer: PA [name], NO Other Clinician: Referring Halley Kincer: Treating Zarek Relph/Extender: [name] in Treatment: 84 Wound Status Wound Number: 1 Primary Etiology: Trauma, Other Wound Location: Left Ankle Wound Status: Open Wounding Event: Trauma Date Acquired: 10/14/2020 Weeks Of Treatment: 84 Clustered Wound:  No Photos Wound Measurements Length: (cm) 2 Width: (cm) 2 Depth: (cm) 0 Area: (cm) Volume: (cm) .5 % Reduction in Area: 91.4% .7 % Reduction in Volume: 95.7% .2 Epithelialization: Large (67-100%) 5.301 Tunneling: No 1.06 Undermining: No Wound Description Classification: Full Thickness Without Exposed Suppor Wound Margin: Flat and Intact Exudate Amount: Medium Exudate Type: Serosanguineous Exudate Color: red, brown t Structures Foul Odor After Cleansing: No Slough/Fibrino Yes Wound Bed Granulation Amount: Large (67-100%) Exposed Structure Granulation Quality: Red Fascia Exposed: No Necrotic Amount: Small (1-33%) Fat Layer (Subcutaneous Tissue) Exposed: Yes Necrotic Quality: Adherent Slough Tendon Exposed: No Muscle Exposed: No Joint Exposed: No Bone Exposed: No Periwound Skin Texture Texture Color No Abnormalities Noted: No No Abnormalities Noted: Yes Scarring: Yes Temperature / Pain Temperature: No Abnormality Moisture No Abnormalities Noted: No Dry / Scaly: No Maceration: No Treatment Notes Wound #1 (Ankle) Wound Laterality: Left [name], [name] ([id number] Page 7 of 7 Cleanser Soap and Water Discharge Instruction: May shower and wash wound with dial antibacterial soap and water prior to dressing change. Wound Cleanser Discharge Instruction: Cleanse the wound with wound cleanser prior to applying a clean dressing using gauze sponges, not tissue or cotton balls. Peri-Wound Care Sween Lotion (Moisturizing lotion) Discharge Instruction: Apply moisturizing lotion as directed Topical Gentamicin Discharge Instruction: As directed by physician Primary Dressing Endoform 2x2 in Discharge Instruction: Moisten with saline Secondary Dressing Drawtex 4x4 in Discharge Instruction: Apply over primary dressing as directed. Zetuvit Plus 4x8 in Discharge Instruction: Apply over primary dressing as directed. Secured  With Compression Wrap ThreePress (3 layer compression wrap) Discharge Instruction: Apply three layer compression as directed. Compression Stockings Compression Stocking Quantity: 1 Left Leg Compression Amount: 30-40 mmHg Right Leg Compression Amount: 30-40 mmHg Discharge Instruction: Apply Compression Stockings daily as instructed. Apply first thing in the morning, remove at night before bed. Add-Ons  Electronic Signature(s) Signed: 09/22/2022 4:21:22 PM By: [name] Entered By: [name] on 09/22/2022 08:54:52 -------------------------------------------------------------------------------- Vitals Details Patient Name: Date of Service: [name], [name] 09/22/2022 8:30 A M Medical Record Number: [id number] Patient Account Number: [id number] Date of Birth/Sex: Treating RN: 1975-11-17 (47 y.o. [name] Primary Care Emiline Mancebo: PA [name], NO Other Clinician: Referring Dyke Weible: Treating Nandana Krolikowski/Extender: [name] in Treatment: 84 Vital Signs Time Taken: 08:37 Temperature (F): 97.8 Height (in): 69 Pulse (bpm): 112 Weight (lbs): 170 Respiratory Rate (breaths/min): 16 Body Mass Index (BMI): 25.1 Blood Pressure (mmHg): 117/63 Reference Range: 80 - 120 mg / dl Electronic Signature(s) Signed: 09/22/2022 4:21:22 PM By: [name] Entered By: [name] on 09/22/2022 08:38:50

## 2022-09-23 NOTE — Progress Notes (Signed)
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 is less fibrotic. 08/31/2022: The  patient has missed a couple of appointments. T oday, his foot and leg are completely macerated. He says that he has been using a plastic bag for showers and that it leaked. The wound is larger, has a thick layer of slough on the surface and is malodorous. 09/07/2022: The wound looks better this week. It is smaller with some slough on the surface. A small satellite wound has opened up just proximal to the main site. 09/15/2022: The wound has a lot more slough on it this week and the satellite wound is larger. He says that he has been standing quite a bit at work. Whittier, [name] ([id number]) [id number] Page 10 of 12 09/22/2022: The satellite lesion is about the same size but much more superficial. The main wound is a also about the same size, but has a healthier-looking surface. There is slough accumulation on both surfaces. Patient History Information obtained from Patient. Family History Unknown History. Social History Current every day smoker, Marital Status - Married, Alcohol Use - Rarely, Drug Use - No History, Caffeine Use - Moderate. Medical A Surgical History Notes nd Gastrointestinal Chronic Gastritis Objective Constitutional Tachycardic, asymptomatic. no acute distress. Vitals Time Taken: 8:37 AM, Height: 69 in, Weight: 170 lbs, BMI: 25.1, Temperature: 97.8 F, Pulse: 112 bpm, Respiratory Rate: 16 breaths/min, Blood Pressure: 117/63 mmHg. Respiratory Normal work of breathing on room air. General Notes: 09/22/2022: The satellite lesion is about the same size but much more superficial. The main wound is a also about the same size, but has a healthier-looking surface. There is slough accumulation on both surfaces. Integumentary (Hair, Skin) Wound #1 status is Open. Original cause of wound was Trauma. The date acquired was: 10/14/2020. The wound has been in treatment 84 weeks. The wound is located on the Left Ankle. The wound measures 2.5cm length x 2.7cm  width x 0.2cm depth; 5.301cm^2 area and 1.06cm^3 volume. There is Fat Layer (Subcutaneous Tissue) exposed. There is no tunneling or undermining noted. There is a medium amount of serosanguineous drainage noted. The wound margin is flat and intact. There is large (67-100%) red granulation within the wound bed. There is a small (1-33%) amount of necrotic tissue within the wound bed including Adherent Slough. The periwound skin appearance had no abnormalities noted for color. The periwound skin appearance exhibited: Scarring. The periwound skin appearance did not exhibit: Dry/Scaly, Maceration. Periwound temperature was noted as No Abnormality. 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 There was a Selective/Open Wound Non-Viable Tissue Debridement with a total area of 6.75 sq cm performed by [name], MD. With the following instrument(s): Curette to remove Non-Viable tissue/material. Material removed includes Eschar and Slough and after achieving pain control using Lidocaine 4% Topical Solution. No specimens were taken. A time out was conducted at 08:49, prior to the start of the procedure. A Minimum amount of bleeding was controlled with Pressure. The procedure was tolerated well. Post Debridement Measurements: 2.5cm length x 2.7cm width  x 0.2cm depth; 1.06cm^3 volume. Character of Wound/Ulcer Post Debridement is improved. Post procedure Diagnosis Wound #1: Same as Pre-Procedure General Notes: scribed for Dr. [name] by [name], RN. Pre-procedure diagnosis of Wound #1 is a Trauma, Other located on the Left Ankle . There was a Three Layer Compression Therapy Procedure by [name], RN. Post procedure Diagnosis Wound  #1: Same as Pre-Procedure Kaufman, [name] ([id number]) [id number].pdf Page 11 of 12 Plan Follow-up Appointments: Return Appointment in 1 week. - Dr. [name] - Room 2 Other: - [place] interpreter required. Anesthetic: (In clinic) Topical Lidocaine 4% applied to wound bed - Used in Clinic Bathing/ Shower/ Hygiene: Other Bathing/Shower/Hygiene Orders/Instructions: - Please use a " CAST PROTECTOR" when showering. This can be purchased from [employer], [place] [employer] [place]. Cost is approximately $14-27. Edema Control - Lymphedema / SCD / Other: Avoid standing for long periods of time. Exercise regularly Off-Loading: Open toe surgical shoe to: - left foot The following medication(s) was prescribed: lidocaine topical 4 % cream cream topical was prescribed at facility WOUND #1: - Ankle Wound Laterality: Left Cleanser: Soap and Water 1 x Per Week/30 Days Discharge Instructions: May shower and wash wound with dial antibacterial soap and water prior to dressing change. Cleanser: Wound Cleanser 1 x Per Week/30 Days Discharge Instructions: Cleanse the wound with wound cleanser prior to applying a clean dressing using gauze sponges, not tissue or cotton balls. Peri-Wound Care: Sween Lotion (Moisturizing lotion) 1 x Per Week/30 Days Discharge Instructions: Apply moisturizing lotion as directed Topical: Gentamicin 1 x Per Week/30 Days Discharge Instructions: As directed by physician Prim Dressing: Endoform 2x2 in 1 x Per Week/30 Days ary Discharge Instructions: Moisten with saline Secondary Dressing: Drawtex 4x4 in 1 x Per Week/30 Days Discharge Instructions: Apply over primary dressing as directed. Secondary Dressing: Zetuvit Plus 4x8 in 1 x Per Week/30 Days Discharge Instructions: Apply over primary dressing as directed. Com pression Wrap: ThreePress (3 layer compression wrap) 1 x Per Week/30 Days Discharge Instructions: Apply three layer compression as  directed. Com pression Stockings: Compression Stocking Compression Amount: 30-40 mmHg (left) Compression Amount: 30-40 mmHg (right) Discharge Instructions: Apply Compression Stockings daily as instructed. Apply first thing in the morning, remove at night before bed. 09/22/2022: The satellite lesion is about the same size but much more superficial. The main wound is a also about the same size, but has a healthier-looking surface. There is slough accumulation on both surfaces. I used a curette to debride slough and eschar from the wound. He is currently out of work per our request and I think this is helping. We will continue topical gentamicin with endoform and drawtex along with 3 layer compression. He will follow-up in 1 week. Electronic Signature(s) Signed: 09/22/2022 8:57:24 AM By: [name] MD FACS Entered By: [name] on 09/22/2022 08:57:24 -------------------------------------------------------------------------------- HxROS Details Patient Name: Date of Service: [name] [name], [name] 09/22/2022 8:30 A M Medical Record Number: [id number] Patient Account Number: [id number] Date of Birth/Sex: Treating RN: Sep 26, 1975 (47 y.o. M) Primary Care Provider: PA Darnelle Spangle Other Clinician: Referring Provider: Treating Provider/Extender: [name] in Treatment: 84 Information Obtained From Patient Gastrointestinal Medical History: Past Medical History Notes: Chronic Gastritis Immunizations Leveille, [name] ([id number]) [id number].pdf Page 12 of 12 Pneumococcal Vaccine: Received Pneumococcal Vaccination: No Implantable Devices No devices added Family and Social History Unknown History: Yes; Current every day smoker; Marital Status - Married; Alcohol Use: Rarely; Drug Use: No History; Caffeine Use: Moderate; Financial Concerns: No; Food, Clothing or Shelter Needs:  No; Support System Lacking: No; Transportation Concerns:  No Electronic Signature(s) Signed: 09/22/2022 10:33:46 AM By: [name] MD FACS Entered By: [name] on 09/22/2022 08:56:06 -------------------------------------------------------------------------------- SuperBill Details Patient Name: Date of Service: [name] [name] 09/22/2022 Medical Record Number: [id number] Patient Account Number: [id number] Date of Birth/Sex: Treating RN: May 24, 1976 (47 y.o. M) Primary Care Provider: PA TIENT, NO Other Clinician: Referring Provider: Treating Provider/Extender: [name] in Treatment: 84 Diagnosis Coding ICD-10 Codes Code Description L97.328 Non-pressure chronic ulcer of left ankle with other specified severity I87.332 Chronic venous hypertension (idiopathic) with ulcer and inflammation of left lower extremity Facility Procedures : CPT4 Code: [id number] Description: [id number] - DEBRIDE WOUND 1ST 20 SQ CM OR < ICD-10 Diagnosis Description L97.328 Non-pressure chronic ulcer of left ankle with other specified severity Modifier: Quantity: 1 Physician Procedures : CPT4 Code Description Modifier [id number] - WC PHYS LEVEL 4 - EST PT 25 ICD-10 Diagnosis Description L97.328 Non-pressure chronic ulcer of left ankle with other specified severity I87.332 Chronic venous hypertension (idiopathic) with ulcer and  inflammation of left lower extremity Quantity: 1 : [id number] - WC PHYS DEBR WO ANESTH 20 SQ CM ICD-10 Diagnosis Description L97.328 Non-pressure chronic ulcer of left ankle with other specified severity Quantity: 1 Electronic Signature(s) Signed: 09/22/2022 8:57:59 AM By: [name] MD FACS Entered By: [name] on 09/22/2022 08:57:58

## 2022-09-29 ENCOUNTER — Encounter (HOSPITAL_BASED_OUTPATIENT_CLINIC_OR_DEPARTMENT_OTHER): Payer: Medicaid Other | Admitting: General Surgery

## 2022-09-29 DIAGNOSIS — L97322 Non-pressure chronic ulcer of left ankle with fat layer exposed: Secondary | ICD-10-CM | POA: Diagnosis not present

## 2022-09-29 DIAGNOSIS — L97529 Non-pressure chronic ulcer of other part of left foot with unspecified severity: Secondary | ICD-10-CM | POA: Diagnosis not present

## 2022-09-29 DIAGNOSIS — M85872 Other specified disorders of bone density and structure, left ankle and foot: Secondary | ICD-10-CM | POA: Diagnosis not present

## 2022-09-29 DIAGNOSIS — I872 Venous insufficiency (chronic) (peripheral): Secondary | ICD-10-CM | POA: Diagnosis not present

## 2022-09-29 DIAGNOSIS — F17200 Nicotine dependence, unspecified, uncomplicated: Secondary | ICD-10-CM | POA: Diagnosis not present

## 2022-09-29 DIAGNOSIS — L97328 Non-pressure chronic ulcer of left ankle with other specified severity: Secondary | ICD-10-CM | POA: Diagnosis not present

## 2022-09-30 NOTE — Progress Notes (Signed)
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 fibrotic. 08/31/2022: The patient has missed a couple  of appointments. T oday, his foot and leg are completely macerated. He says that he has been using a plastic bag for showers and that it leaked. The wound is larger, has a thick layer of slough on the surface and is malodorous. 09/07/2022: The wound looks better this week. It is smaller with some slough on the surface. A small satellite wound has opened up just proximal to the main site. 09/15/2022: The wound has a lot more slough on it this week and the satellite wound is larger. He says that he has been standing quite a bit at work. 09/22/2022: The satellite lesion is about the same size but much more superficial. The main wound is a also about the same size, but has a healthier-looking surface. There is slough accumulation on both surfaces. 09/29/2022: The main wound is smaller. The satellite lesion is about the same size. Both have slough on the surface. Patient History Information obtained from Patient. Family History Unknown History. Social History Current every day smoker, Marital Status - Married, Alcohol Use - Rarely, Drug Use - No History, Caffeine Use - Moderate. Medical A Surgical History Notes nd Gastrointestinal Chronic Gastritis Objective Constitutional no acute distress. Vitals Time Taken: 8:34 AM, Height: 69 in, Weight: 170 lbs, BMI: 25.1, Temperature: 98.6 F, Pulse: 76 bpm, Respiratory Rate: 16 breaths/min, Blood Pressure: 129/93 mmHg. Respiratory Normal work of breathing on room air. Wicke, [name] ([id number]) [id number] Page 10 of 12 General Notes: 09/29/2022: The main wound is smaller. The satellite lesion is about the same size. Both have slough on the surface. Integumentary (Hair, Skin) Wound #1 status is Open. Original cause of wound was Trauma. The date acquired was: 10/14/2020. The wound has been in treatment 85 weeks. The wound is located on the Left Ankle. The wound measures 2.5cm length x 2cm width x 0.2cm depth; 3.927cm^2 area and  0.785cm^3 volume. There is Fat Layer (Subcutaneous Tissue) exposed. There is no tunneling or undermining noted. There is a medium amount of serosanguineous drainage noted. The wound margin is flat and intact. There is large (67-100%) red granulation within the wound bed. There is a small (1-33%) amount of necrotic tissue within the wound bed including Adherent Slough. The periwound skin appearance had no abnormalities noted for color. The periwound skin appearance exhibited: Scarring. The periwound skin appearance did not exhibit: Dry/Scaly, Maceration. Periwound temperature was noted as No Abnormality. 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 There was a Selective/Open Wound Non-Viable Tissue Debridement with a total area of 5 sq cm performed by [name], MD. With the following instrument(s): Curette to remove Non-Viable tissue/material. Material removed includes Eschar and Slough and after achieving pain control using Lidocaine 4% Topical Solution. No specimens were taken. A time out was conducted at 08:45, prior to the start of the procedure. A Minimum amount of bleeding was controlled with Pressure. The procedure was tolerated well. Post Debridement Measurements: 2.5cm  length x 2cm width x 0.2cm depth; 0.785cm^3 volume. Character of Wound/Ulcer Post Debridement is improved. Post procedure Diagnosis Wound #1: Same as Pre-Procedure General Notes: scribed for Dr. [name] by [name], RN. Pre-procedure diagnosis of Wound #1 is a Trauma, Other located on the Left Ankle . There was a Three Layer Compression Therapy Procedure by [name], RN. Post procedure Diagnosis Wound #1: Same as Pre-Procedure Plan Follow-up  Appointments: Return Appointment in 1 week. - Dr. [name] - Room 2 Other: - [place] interpreter required. Anesthetic: (In clinic) Topical Lidocaine 5% applied to wound bed Bathing/ Shower/ Hygiene: Other Bathing/Shower/Hygiene Orders/Instructions: - Please use a " CAST PROTECTOR" when showering. This can be purchased from [employer], [place] [employer] [place]. Cost is approximately $14-27. Edema Control - Lymphedema / SCD / Other: Avoid standing for long periods of time. Exercise regularly Off-Loading: Open toe surgical shoe to: - left foot The following medication(s) was prescribed: lidocaine topical 5 % ointment ointment topical was prescribed at facility WOUND #1: - Ankle Wound Laterality: Left Cleanser: Soap and Water 1 x Per Week/30 Days Discharge Instructions: May shower and wash wound with dial antibacterial soap and water prior to dressing change. Cleanser: Wound Cleanser 1 x Per Week/30 Days Discharge Instructions: Cleanse the wound with wound cleanser prior to applying a clean dressing using gauze sponges, not tissue or cotton balls. Peri-Wound Care: Sween Lotion (Moisturizing lotion) 1 x Per Week/30 Days Discharge Instructions: Apply moisturizing lotion as directed Topical: Gentamicin 1 x Per Week/30 Days Discharge Instructions: As directed by physician Prim Dressing: Endoform 2x2 in 1 x Per Week/30 Days ary Discharge Instructions: Moisten with saline Secondary Dressing: Drawtex 4x4 in 1 x Per Week/30 Days Discharge Instructions: Apply over primary dressing as directed. Secondary Dressing: Zetuvit Plus 4x8 in 1 x Per Week/30 Days Discharge Instructions: Apply over primary dressing as directed. Com pression Wrap: ThreePress (3 layer compression wrap) 1 x Per Week/30 Days Discharge Instructions: Apply three layer compression as directed. Com pression Stockings: Compression Stocking Compression Amount: 30-40 mmHg (left) Compression Amount: 30-40 mmHg (right) Discharge  Instructions: Apply Compression Stockings daily as instructed. Apply first thing in the morning, remove at night before bed. Belling, [name] ([id number]) [id number] Page 11 of 12 09/29/2022: The main wound is smaller. The satellite lesion is about the same size. Both have slough on the surface. I used a curette to debride slough and eschar off of the wound surfaces. We will continue topical gentamicin, endoform, and 3 layer compression. Follow-up in 1 week. Electronic Signature(s) Signed: 09/29/2022 8:52:22 AM By: [name] MD FACS Entered By: [name] on 09/29/2022 08:52:22 -------------------------------------------------------------------------------- HxROS Details Patient Name: Date of Service: [name] [name] 09/29/2022 8:45 A M Medical Record Number: [id number] Patient Account Number: [id number] Date of Birth/Sex: Treating RN: 1975-08-25 (47 y.o. M) Primary Care Provider: PA [name], NO Other Clinician: Referring Provider: Treating Provider/Extender: [name] in Treatment: 78 Information Obtained From Patient Gastrointestinal Medical History: Past Medical History Notes: Chronic Gastritis Immunizations Pneumococcal Vaccine: Received Pneumococcal Vaccination: No Implantable Devices No devices added Family and Social History Unknown History: Yes; Current every day smoker; Marital Status - Married; Alcohol Use: Rarely; Drug Use: No History; Caffeine Use: Moderate; Financial Concerns: No; Food, Clothing or Shelter Needs: No; Support System Lacking: No; Transportation Concerns: No Electronic Signature(s) Signed: 09/29/2022 8:57:53 AM By: [name] MD FACS Entered By: [name] on 09/29/2022 08:51:19 -------------------------------------------------------------------------------- SuperBill Details Patient Name: Date of Service: [name] [name], [name] [place] 09/29/2022 Medical Record  Number:  [id number] Patient Account Number: [id number] Date of Birth/Sex: Treating RN: September 15, 1975 (47 y.o. M) Primary Care Provider: PA TIENT, NO Other Clinician: Referring Provider: Treating Provider/Extender: [name] in Treatment: 85 Diagnosis Coding ICD-10 Codes Code Description L97.328 Non-pressure chronic ulcer of left ankle with other specified severity I87.332 Chronic venous hypertension (idiopathic) with ulcer and inflammation of left lower extremity Facility Procedures : [facility] CPT4 Code: [id number] JINU, HOKE [id number] ICD-1 L Description: [id number] - DEBRIDE WOUND 1ST 20 SQ CM OR < 23) [id number] Diagnosis Description 97.328 Non-pressure chronic ulcer of left ankle with other specified severity Modifier: [id number] Quantity: 1 .pdf Page 12 of 12 Physician Procedures : CPT4 Code Description Modifier [id number] - WC PHYS LEVEL 4 - EST PT 25 ICD-10 Diagnosis Description L97.328 Non-pressure chronic ulcer of left ankle with other specified severity I87.332 Chronic venous hypertension (idiopathic) with ulcer and  inflammation of left lower extremity Quantity: 1 : [id number] - WC PHYS DEBR WO ANESTH 20 SQ CM ICD-10 Diagnosis Description L97.328 Non-pressure chronic ulcer of left ankle with other specified severity Quantity: 1 Electronic Signature(s) Signed: 09/29/2022 8:52:36 AM By: [name] MD FACS Entered By: [name] on 09/29/2022 08:52:36

## 2022-09-30 NOTE — Progress Notes (Signed)
[name], [name] ([id number]) [id number] Page 1 of 7 Visit Report for 09/29/2022 Arrival Information Details Patient Name: Date of Service: [name] [place] NIE 09/29/2022 8:45 A M Medical Record Number: [id number] Patient Account Number: [id number] Date of Birth/Sex: Treating RN: Nov 19, 1975 (47 y.o. [name] Primary Care Raeanne Deschler: PA [name], [place] Other Clinician: Referring Gabryella Murfin: Treating Berlie Hatchel/Extender: [name] in Treatment: 8 Visit Information History Since Last Visit Added or deleted any medications: No Patient Arrived: Ambulatory Any new allergies or adverse reactions: No Arrival Time: 08:32 Had a fall or experienced change in No Accompanied By: translator activities of daily living that may affect Transfer Assistance: None risk of falls: Patient Identification Verified: Yes Signs or symptoms of abuse/neglect since last visito No Secondary Verification Process Completed: Yes Hospitalized since last visit: No Patient Requires Transmission-Based Precautions: No Implantable device outside of the clinic excluding No Patient Has Alerts: No cellular tissue based products placed in the center since last visit: Has Dressing in Place as Prescribed: Yes Has Compression in Place as Prescribed: Yes Pain Present Now: No Electronic Signature(s) Signed: 09/29/2022 3:42:50 PM By: [name] Entered By: [name] on 09/29/2022 08:34:02 -------------------------------------------------------------------------------- Compression Therapy Details Patient Name: Date of Service: [name] 09/29/2022 8:45 A M Medical Record Number: [id number] Patient Account Number: [id number] Date of Birth/Sex: Treating RN: 09-16-1975 (47 y.o. [name] Primary Care Kimari Coudriet: PA [name], NO Other Clinician: Referring Kiva Norland: Treating Telesforo Brosnahan/Extender: [name] in  Treatment: 37 Compression Therapy Performed for Wound Assessment: Wound #1 Left Ankle Performed By: Clinician [name], RN Compression Type: Three Layer Post Procedure Diagnosis Same as Pre-procedure Electronic Signature(s) Signed: 09/29/2022 3:42:50 PM By: Sabas Sous By: [name] on 09/29/2022 08:47:34 -------------------------------------------------------------------------------- Encounter Discharge Information Details Patient Name: Date of Service: [name] NA [place] 09/29/2022 8:45 A M Medical Record Number: [id number] Patient Account Number: [id number] Date of Birth/Sex: Treating RN: 08/13/1975 (47 y.o. [name] Primary Care Austen Oyster: PA [name], NO Other Clinician: Referring Jamerson Vonbargen: Treating Lynsay Fesperman/Extender: [name] in Treatment: 60 Encounter Discharge Information Items Post Procedure Vitals Discharge Condition: Stable Temperature (F): 98.6 Ambulatory Status: Ambulatory Pulse (bpm): 76 Discharge Destination: Home Respiratory Rate (breaths/min): 16 Transportation: Private Auto Blood Pressure (mmHg): 129/93 Castellana, [name] ([id number]) [phone].pdf Page 2 of 7 Accompanied By: translator Schedule Follow-up Appointment: Yes Clinical Summary of Care: Patient Declined Electronic Signature(s) Signed: 09/29/2022 3:42:50 PM By: Sabas Sous By: [name] on 09/29/2022 09:03:07 -------------------------------------------------------------------------------- Lower Extremity Assessment Details Patient Name: Date of Service: [name] NA [place] 09/29/2022 8:45 A M Medical Record Number: [id number] Patient Account Number: [id number] Date of Birth/Sex: Treating RN: [birth year] (47 y.o. [name] Primary Care Cathryn Gallery: PA [name], NO Other Clinician: Referring Shalik Sanfilippo: Treating Kearstin Learn/Extender: [name] in  Treatment: 85 Edema Assessment Assessed: [Left: No] [Right: No] Edema: [Left: Ye] [Right: s] Calf Left: Right: Point of Measurement: 28 cm From Medial Instep 29 cm Ankle Left: Right: Point of Measurement: 8 cm From Medial Instep 21 cm Vascular Assessment Pulses: Dorsalis Pedis Palpable: [Left:Yes] Electronic Signature(s) Signed: 09/29/2022 3:42:50 PM By: [name] Entered By: [name] on 09/29/2022 08:39:12 -------------------------------------------------------------------------------- Multi Wound Chart Details Patient Name: Date of Service: [name] NA [place] 09/29/2022 8:45 A M Medical Record Number: [id number] Patient Account Number: [id number] Date of Birth/Sex: Treating RN: November 20, 1975 (47 y.o. M) Primary Care Climmie Buelow: PA [name], NO Other Clinician: Referring Cherolyn Behrle: Treating Harless Molinari/Extender: [name]  Melvenia Needles in Treatment: 44 Vital Signs Height(in): 69 Pulse(bpm): 76 Weight(lbs): 170 Blood Pressure(mmHg): 129/93 Body Mass Index(BMI): 25.1 Temperature(F): 98.6 Respiratory Rate(breaths/min): 16 [1:Photos:] [N/A:N/A] Left Ankle N/A N/A Wound Location: Trauma N/A N/A Wounding Event: Trauma, Other N/A N/A Primary Etiology: 10/14/2020 N/A N/A Date Acquired: 83 N/A N/A Weeks of Treatment: Open N/A N/A Wound Status: No N/A N/A Wound Recurrence: 2.5x2x0.2 N/A N/A Measurements L x W x D (cm) 3.927 N/A N/A A (cm) : rea 0.785 N/A N/A Volume (cm) : 93.60% N/A N/A % Reduction in A rea: 96.80% N/A N/A % Reduction in Volume: Full Thickness Without Exposed N/A N/A Classification: Support Structures Medium N/A N/A Exudate A mount: Serosanguineous N/A N/A Exudate Type: red, brown N/A N/A Exudate Color: Flat and Intact N/A N/A Wound Margin: Large (67-100%) N/A N/A Granulation A mount: Red N/A N/A Granulation Quality: Small (1-33%) N/A N/A Necrotic A mount: Fat Layer (Subcutaneous Tissue): Yes N/A  N/A Exposed Structures: Fascia: No Tendon: No Muscle: No Joint: No Bone: No Large (67-100%) N/A N/A Epithelialization: Debridement - Selective/Open Wound N/A N/A Debridement: Pre-procedure Verification/Time Out 08:45 N/A N/A Taken: Lidocaine 4% Topical Solution N/A N/A Pain Control: Necrotic/Eschar, Slough N/A N/A Tissue Debrided: Non-Viable Tissue N/A N/A Level: 5 N/A N/A Debridement A (sq cm): rea Curette N/A N/A Instrument: Minimum N/A N/A Bleeding: Pressure N/A N/A Hemostasis A chieved: Procedure was tolerated well N/A N/A Debridement Treatment Response: 2.5x2x0.2 N/A N/A Post Debridement Measurements L x W x D (cm) 0.785 N/A N/A Post Debridement Volume: (cm) Scarring: Yes N/A N/A Periwound Skin Texture: Maceration: No N/A N/A Periwound Skin Moisture: Dry/Scaly: No No Abnormalities Noted N/A N/A Periwound Skin Color: No Abnormality N/A N/A Temperature: Compression Therapy N/A N/A Procedures Performed: Debridement Treatment Notes Electronic Signature(s) Signed: 09/29/2022 8:50:35 AM By: [name] MD FACS Entered By: [name] on 09/29/2022 08:50:34 -------------------------------------------------------------------------------- Multi-Disciplinary Care Plan Details Patient Name: Date of Service: [name] NA [place] 09/29/2022 8:45 A M Medical Record Number: [id number] Patient Account Number: [id number] Date of Birth/Sex: Treating RN: 06-11-1976 (47 y.o. [name] Primary Care Alaiza Yau: PA [name], [place] Other Clinician: Referring Pervis Macintyre: Treating Jill Stopka/Extender: [name] in Treatment: 35 [place] reviewed with physician Active Inactive Venous Leg Ulcer Nursing Diagnoses: Actual venous Insuffiency (use after diagnosis is confirmed) Roycroft, [name] ([id number]) [phone].pdf Page 4 of 7 Knowledge deficit related to disease process and  management Goals: Patient will maintain optimal edema control Date Initiated: 02/19/2022 Target Resolution Date: 10/30/2022 Goal Status: Active Interventions: Assess peripheral edema status every visit. Compression as ordered Treatment Activities: Therapeutic compression applied : 02/19/2022 Notes: Wound/Skin Impairment Nursing Diagnoses: Knowledge deficit related to ulceration/compromised skin integrity Goals: Patient/caregiver will verbalize understanding of skin care regimen Date Initiated: 02/05/2021 Target Resolution Date: 10/30/2022 Goal Status: Active Interventions: Assess patient/caregiver ability to obtain necessary supplies Assess patient/caregiver ability to perform ulcer/skin care regimen upon admission and as needed Provide education on ulcer and skin care Treatment Activities: Skin care regimen initiated : 02/05/2021 Topical wound management initiated : 02/05/2021 Notes: 03/31/21: Wound care regimen ongoing, target date extended. 04/21/21: Wound care ongoing, through interpreter patient states he is doing fine with his dressing changes. Electronic Signature(s) Signed: 09/29/2022 3:42:50 PM By: [name] Entered By: [name] on 09/29/2022 08:47:05 -------------------------------------------------------------------------------- Pain Assessment Details Patient Name: Date of Service: [name] NA [place] 09/29/2022 8:45 A M Medical Record Number: [id number] Patient Account Number: [id number] Date of Birth/Sex: Treating RN: 03-Nov-1975 (47 y.o. Flossie Buffy,  Lovena Le Primary Care Billye Pickerel: PA [name], [place] Other Clinician: Referring Kerisha Goughnour: Treating Claressa Hughley/Extender: [name] in Treatment: 37 Active Problems Location of Pain Severity and Description of Pain Patient Has Paino No Site Locations Rate the pain. Current Pain Level: 0 Eckley, [name] ([id number]) [phone].pdf Page 5 of 7 Pain Management  and Medication Current Pain Management: Electronic Signature(s) Signed: 09/29/2022 3:42:50 PM By: [name] Entered By: [name] on 09/29/2022 08:34:47 -------------------------------------------------------------------------------- Patient/Caregiver Education Details Patient Name: Date of Service: [name] 2/28/2024andnbsp8:45 A M Medical Record Number: [id number] Patient Account Number: [id number] Date of Birth/Gender: Treating RN: 27-Apr-1976 (47 y.o. [name] Primary Care Physician: PA [name], [place] Other Clinician: Referring Physician: Treating Physician/Extender: [name] in Treatment: 16 Education Assessment Education Provided To: Patient Education Topics Provided Wound/Skin Impairment: Methods: Explain/Verbal Responses: Reinforcements needed, State content correctly Electronic Signature(s) Signed: 09/29/2022 3:42:50 PM By: [name] Entered By: [name] on 09/29/2022 08:47:17 -------------------------------------------------------------------------------- Wound Assessment Details Patient Name: Date of Service: [name] NA [place] 09/29/2022 8:45 A M Medical Record Number: [id number] Patient Account Number: [id number] Date of Birth/Sex: Treating RN: 1975/10/03 (47 y.o. [name] Primary Care Corinda Ammon: PA [name], NO Other Clinician: Referring Dyna Figuereo: Treating Taysia Rivere/Extender: [name] in Treatment: 45 Wound Status Wound Number: 1 Primary Etiology: Trauma, Other Wound Location: Left Ankle Wound Status: Open Wounding Event: Trauma Date Acquired: 10/14/2020 Weeks Of Treatment: 85 Clustered Wound: No Photos Wound Measurements Salameh, Ciaran ([id number]) Length: (cm) 2.5 Width: (cm) 2 Depth: (cm) 0.2 Area: (cm) 3.927 Volume: (cm) 0.785 [id number] Page 6 of 7 % Reduction in Area: 93.6% % Reduction in  Volume: 96.8% Epithelialization: Large (67-100%) Tunneling: No Undermining: No Wound Description Classification: Full Thickness Without Exposed Support Structures Wound Margin: Flat and Intact Exudate Amount: Medium Exudate Type: Serosanguineous Exudate Color: red, brown Foul Odor After Cleansing: No Slough/Fibrino Yes Wound Bed Granulation Amount: Large (67-100%) Exposed Structure Granulation Quality: Red Fascia Exposed: No Necrotic Amount: Small (1-33%) Fat Layer (Subcutaneous Tissue) Exposed: Yes Necrotic Quality: Adherent Slough Tendon Exposed: No Muscle Exposed: No Joint Exposed: No Bone Exposed: No Periwound Skin Texture Texture Color No Abnormalities Noted: No No Abnormalities Noted: Yes Scarring: Yes Temperature / Pain Temperature: No Abnormality Moisture No Abnormalities Noted: No Dry / Scaly: No Maceration: No Treatment Notes Wound #1 (Ankle) Wound Laterality: Left Cleanser Soap and Water Discharge Instruction: May shower and wash wound with dial antibacterial soap and water prior to dressing change. Wound Cleanser Discharge Instruction: Cleanse the wound with wound cleanser prior to applying a clean dressing using gauze sponges, not tissue or cotton balls. Peri-Wound Care Sween Lotion (Moisturizing lotion) Discharge Instruction: Apply moisturizing lotion as directed Topical Gentamicin Discharge Instruction: As directed by physician Primary Dressing Endoform 2x2 in Discharge Instruction: Moisten with saline Secondary Dressing Drawtex 4x4 in Discharge Instruction: Apply over primary dressing as directed. Zetuvit Plus 4x8 in Discharge Instruction: Apply over primary dressing as directed. Secured With Compression Wrap ThreePress (3 layer compression wrap) Discharge Instruction: Apply three layer compression as directed. Compression Stockings Compression Stocking Quantity: 1 Left Leg Compression Amount: 30-40 mmHg Right Leg Compression Amount: 30-40  mmHg Discharge Instruction: Apply Compression Stockings daily as instructed. Apply first thing in the morning, remove at night before bed. Add-Ons Broerman, [name] ([id number]) [id number] Page 7 of 7 Electronic Signature(s) Signed: 09/29/2022 3:42:50 PM By: Sabas Sous By: [name] on 09/29/2022 08:43:05 -------------------------------------------------------------------------------- Vitals Details Patient Name: Date of Service: [name]  [name] 09/29/2022 8:45 A M Medical Record Number: [id number] Patient Account Number: [id number] Date of Birth/Sex: Treating RN: [birth year] (47 y.o. [name] Primary Care Courtland Coppa: PA [name], NO Other Clinician: Referring Eniola Cerullo: Treating Rico Massar/Extender: [name] in Treatment: 73 Vital Signs Time Taken: 08:34 Temperature (F): 98.6 Height (in): 69 Pulse (bpm): 76 Weight (lbs): 170 Respiratory Rate (breaths/min): 16 Body Mass Index (BMI): 25.1 Blood Pressure (mmHg): 129/93 Reference Range: 80 - 120 mg / dl Electronic Signature(s) Signed: 09/29/2022 3:42:50 PM By: [name] Entered By: [name] on 09/29/2022 08:35:18

## 2022-10-01 DIAGNOSIS — Z419 Encounter for procedure for purposes other than remedying health state, unspecified: Secondary | ICD-10-CM | POA: Diagnosis not present

## 2022-10-04 ENCOUNTER — Other Ambulatory Visit: Payer: Medicaid Other | Admitting: *Deleted

## 2022-10-04 ENCOUNTER — Encounter: Payer: Self-pay | Admitting: *Deleted

## 2022-10-04 NOTE — Patient Outreach (Signed)
Medicaid Managed Care   Nurse Care Manager Note  10/04/2022 Name:  Blayze Yebra MRN:  [id number] DOB:  [birth year]  [name] is an 47 y.o. year old male who is a primary patient of Patient, No Pcp Per.  The [facility] Managed Care Coordination team was consulted for assistance with:    Wound care  Mr. Godette was given information about Medicaid Managed Care Coordination team services today. Dalbert Wiechmann Patient agreed to services and verbal consent obtained.  Engaged with patient by telephone for follow up visit in response to provider referral for case management and/or care coordination services.   Assessments/Interventions:  Review of past medical history, allergies, medications, health status, including review of consultants reports, laboratory and other test data, was performed as part of comprehensive evaluation and provision of chronic care management services.  SDOH (Social Determinants of Health) assessments and interventions performed: SDOH Interventions    Flowsheet Row Patient Outreach Telephone from 10/04/2022 in [place] Patient Outreach Telephone from 07/27/2022 in [place] Interventions -- Intervention Not Indicated  Housing Interventions -- Other (Comment)  [BSW referral, scheduled on 07/28/22 at 3pm]  Transportation Interventions -- Intervention Not Indicated  Financial Strain Interventions Other (Comment)  [referral to BSW] --       Care Plan  No Known Allergies  Medications Reviewed Today     Reviewed by [name], RN (Registered Nurse) on 10/04/22 at 1307  Med List Status: <None>   Medication Order Taking? Sig Documenting Provider Last Dose Status Informant  omeprazole (PRILOSEC) 40 MG capsule [id number]  Take 1 capsule (40 mg total) by mouth daily. 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   Chronic gastritis 04/09/2021   Refugee health examination 04/07/2021   Unable to read or write in [place] or [place]  04/07/2021   Tobacco abuse 04/07/2021   Hematuria 04/07/2021   Medication monitoring encounter 03/18/2021   Skin ulcer ([place]) 02/17/2021    Conditions to be addressed/monitored per PCP order:   wound care  Care Plan : RN Care Manager Plan of Care  Updates made by [name], RN since 10/04/2022 12:00 AM     Problem: Health Management needs related to Wound Care      Long-Range Goal: Development of Plan of Care to address Health Management needs related to Wound Care   Start Date: 07/27/2022  Expected End Date: 12/31/2022  Priority: High  Note:   Current Barriers:  Knowledge Deficits related to plan of care for management of Wound Management  Financial Constraints. Patient does not have a PCP.  RNCM Clinical Goal(s):  Patient will verbalize understanding of plan for management of Wound Care as evidenced by patient reports attend all scheduled medical appointments: reschedule missed visit with Wound Care as evidenced by provider EMR documentation        work with social worker to address Financial constraints related to housing related to the management of Wound Care as evidenced by review of EMR and patient or social worker report       Interventions: Inter-disciplinary care team collaboration (see longitudinal plan of care) Evaluation of current treatment plan related to  self management and patient's adherence to plan as established by provider   Wound Care  (Status: Goal on  Track (progressing): YES.) Long Term Goal  Evaluation of current treatment plan related to  Wound Care , Financial constraints related to affording housing  self-management and patient's adherence to plan as established by provider. Discussed plans with patient for ongoing care management follow up and provided  patient with direct contact information for care management team Assessed social determinant of health barriers;  Discussed the importance of having a PCP-patient did not go to the new patient visit on 08/18/22, would like to reschedule RNCM sent secure message to scheduler at PCP office requesting to reschedule missed appointment-waiting on response Utilized Swahili interpreter [phone] during this entire visit Discussed wound care-patient states it is healing and getting much better Collaborated with BSW for assistance with financial resources  Patient Goals/Self-Care Activities: Attend all scheduled provider appointments Work with the social worker to address care coordination needs and will continue to work with the clinical team to address health care and disease management related needs       Follow Up:  Patient agrees to Care Plan and Follow-up.  Plan: The Managed Medicaid care management team will reach out to the patient again over the next 30 days.  Date/time of next scheduled RN care management/care coordination outreach:  11/03/22 at 10:30am  [name] RN, BSN [place] RN Care Coordinator

## 2022-10-04 NOTE — Patient Instructions (Signed)
Visit Information  [name] was given information about Medicaid Managed Care team care coordination services as a part of their [facility] Medicaid benefit. [name] verbally consented to engagement with the [facility] Managed Care team.   If you are experiencing a medical emergency, please call 911 or report to your local emergency department or urgent care.   If you have a non-emergency medical problem during routine business hours, please contact your provider's office and ask to speak with a nurse.   For questions related to your [facility] health plan, please call: [phone] or go here:[name]  If you would like to schedule transportation through your [facility] plan, please call the following number at least 2 days in advance of your appointment: [phone].  You can also use the MTM portal or MTM mobile app to manage your rides. For the portal, please go to [email or web address].  Call the [place] at [phone], at any time, 24 hours a day, 7 days a week. If you are in danger or need immediate medical attention call 911.  If you would like help to quit smoking, call 1-800-QUIT-NOW (734)867-5072) OR Espaol: 1-855-Djelo-Ya HD:1601594) o para ms informacin haga clic aqu or Text READY to 200-400 to register via text  [name],   Please see education materials related to health maintenance provided as print materials.   The patient verbalized understanding of instructions, educational materials, and care plan provided today and agreed to receive a mailed copy of patient instructions, educational materials, and care plan.   Telephone follow up appointment with Managed Medicaid care management team member scheduled for:11/03/22 @ 10:30am  [name] RN, [place] RN Care Coordinator   Following is a copy of your plan of care:  Care Plan : RN Care Manager Plan of  Care  Updates made by [name], RN since 10/04/2022 12:00 AM     Problem: Health Management needs related to Wound Care      Long-Range Goal: Development of Plan of Care to address Health Management needs related to Wound Care   Start Date: 07/27/2022  Expected End Date: 12/31/2022  Priority: High  Note:   Current Barriers:  Knowledge Deficits related to plan of care for management of Wound Management  Financial Constraints. Patient does not have a PCP.  RNCM Clinical Goal(s):  Patient will verbalize understanding of plan for management of Wound Care as evidenced by patient reports attend all scheduled medical appointments: reschedule missed visit with Wound Care as evidenced by provider EMR documentation        work with Education officer, museum to address Financial constraints related to housing related to the management of Wound Care as evidenced by review of EMR and patient or social worker report       Interventions: Inter-disciplinary care team collaboration (see longitudinal plan of care) Evaluation of current treatment plan related to  self management and patient's adherence to plan as established by provider   Wound Care  (Status: Goal on Track (progressing): YES.) Long Term Goal  Evaluation of current treatment plan related to  Wound Care , Financial constraints related to affording housing  self-management and patient's adherence to plan as established by provider. Discussed plans with patient for ongoing care management follow up and provided patient with direct contact information for care management team Assessed social determinant of health barriers;  Discussed the importance of having a PCP-patient did not go to the new patient visit on 08/18/22, would  like to reschedule RNCM sent secure message to scheduler at PCP office requesting to reschedule missed appointment-waiting on response Utilized Swahili interpreter [phone] during this entire visit Discussed wound care-patient  states it is healing and getting much better Collaborated with BSW for assistance with financial resources  Patient Goals/Self-Care Activities: Attend all scheduled provider appointments Work with the social worker to address care coordination needs and will continue to work with the clinical team to address health care and disease management related needs

## 2022-10-06 ENCOUNTER — Encounter (HOSPITAL_BASED_OUTPATIENT_CLINIC_OR_DEPARTMENT_OTHER): Payer: Medicaid Other | Attending: General Surgery | Admitting: General Surgery

## 2022-10-06 DIAGNOSIS — L97328 Non-pressure chronic ulcer of left ankle with other specified severity: Secondary | ICD-10-CM | POA: Diagnosis not present

## 2022-10-06 DIAGNOSIS — L97322 Non-pressure chronic ulcer of left ankle with fat layer exposed: Secondary | ICD-10-CM | POA: Diagnosis not present

## 2022-10-06 DIAGNOSIS — F17200 Nicotine dependence, unspecified, uncomplicated: Secondary | ICD-10-CM | POA: Insufficient documentation

## 2022-10-06 DIAGNOSIS — T8189XA Other complications of procedures, not elsewhere classified, initial encounter: Secondary | ICD-10-CM | POA: Diagnosis not present

## 2022-10-06 DIAGNOSIS — I87332 Chronic venous hypertension (idiopathic) with ulcer and inflammation of left lower extremity: Secondary | ICD-10-CM | POA: Diagnosis not present

## 2022-10-08 NOTE — Progress Notes (Signed)
Dollens, [name] ([id number]) [id number] Page 1 of 7 Visit Report for 10/06/2022 Arrival Information Details Patient Name: Date of Service: [name] [name] [place] 10/06/2022 8:30 A M Medical Record Number: [id number] Patient Account Number: [id number] Date of Birth/Sex: Treating RN: [birth year] (47 y.o. [name] Primary Care [name]: PA [name], NO Other Clinician: Referring [name]: Treating [name]: [name] in Treatment: 92 Visit Information History Since Last Visit Added or deleted any medications: No Patient Arrived: Ambulatory Any new allergies or adverse reactions: No Arrival Time: 08:30 Had a fall or experienced change in No Accompanied By: translator activities of daily living that may affect Transfer Assistance: None risk of falls: Patient Identification Verified: Yes Signs or symptoms of abuse/neglect since last visito No Secondary Verification Process Completed: Yes Hospitalized since last visit: No Patient Requires Transmission-Based Precautions: No Implantable device outside of the clinic excluding No Patient Has Alerts: No cellular tissue based products placed in the center since last visit: Has Dressing in Place as Prescribed: Yes Has Compression in Place as Prescribed: Yes Pain Present Now: No Electronic Signature(s) Signed: 10/06/2022 4:53:16 PM By: [name] Entered By: [name] on 10/06/2022 08:32:55 -------------------------------------------------------------------------------- Compression Therapy Details Patient Name: Date of Service: [name] 10/06/2022 8:30 A M Medical Record Number: [id number] Patient Account Number: [id number] Date of Birth/Sex: Treating RN: 31-Jan-1976 (47 y.o. [name] Primary Care [name]: PA [name], NO Other Clinician: Referring Avyukth Bontempo: Treating [name]/Extender: [name] in  Treatment: 86 Compression Therapy Performed for Wound Assessment: Wound #1 Left Ankle Performed By: Clinician [name] Peals, RN Compression Type: Three Layer Post Procedure Diagnosis Same as Pre-procedure Electronic Signature(s) Signed: 10/06/2022 4:53:16 PM By: [name] Entered By: [name] on 10/06/2022 08:45:40 -------------------------------------------------------------------------------- Encounter Discharge Information Details Patient Name: Date of Service: [name] NA [place] 10/06/2022 8:30 A M Medical Record Number: [id number] Patient Account Number: [id number] Date of Birth/Sex: Treating RN: 04-17-1976 (47 y.o. [name] Primary Care [name]: PA [name], NO Other Clinician: Referring [name]: Treating [name]/Extender: [name] in Treatment: 68 Encounter Discharge Information Items Post Procedure Vitals Discharge Condition: Stable Temperature (F): 98.4 Ambulatory Status: Ambulatory Pulse (bpm): 58 Discharge Destination: Home Respiratory Rate (breaths/min): 16 Transportation: Private Auto Blood Pressure (mmHg): 109/67 [name] ([id number] Page 2 of 7 Accompanied By: self Schedule Follow-up Appointment: Yes Clinical Summary of Care: Patient Declined Electronic Signature(s) Signed: 10/06/2022 4:53:16 PM By: [name] By: [name] on 10/06/2022 09:11:48 -------------------------------------------------------------------------------- Lower Extremity Assessment Details Patient Name: Date of Service: [name] NA [place] 10/06/2022 8:30 A M Medical Record Number: [id number] Patient Account Number: [id number] Date of Birth/Sex: Treating RN: 1976-01-04 (47 y.o. [name] Primary Care [name]: PA [name], NO Other Clinician: Referring [name]: Treating [name]: [name] in Treatment:  86 Edema Assessment Assessed: [Left: No] [Right: No] Edema: [Left: Ye] [Right: s] Calf Left: Right: Point of Measurement: 28 cm From Medial Instep 29.5 cm Ankle Left: Right: Point of Measurement: 8 cm From Medial Instep 21 cm Vascular Assessment Pulses: Dorsalis Pedis Palpable: [Left:Yes] Electronic Signature(s) Signed: 10/06/2022 4:53:16 PM By: [name] Entered By: [name] on 10/06/2022 08:38:49 -------------------------------------------------------------------------------- Multi Wound Chart Details Patient Name: Date of Service: [name] NA [place] 10/06/2022 8:30 A M Medical Record Number: [id number] Patient Account Number: [id number] Date of Birth/Sex: Treating RN: 1976-08-01 (47 y.o. M) Primary Care [name]: PA [name], NO Other Clinician: Referring [name]: Treating [name]: [name]  [name] in Treatment: 86 Vital Signs Height(in): 69 Pulse(bpm): 58 Weight(lbs): 170 Blood Pressure(mmHg): 109/67 Body Mass Index(BMI): 25.1 Temperature(F): 98.4 Respiratory Rate(breaths/min): 16 [1:Photos:] [N/A:N/A] Left Ankle N/A N/A Wound Location: Trauma N/A N/A Wounding Event: Trauma, Other N/A N/A Primary Etiology: 10/14/2020 N/A N/A Date Acquired: 53 N/A N/A Weeks of Treatment: Open N/A N/A Wound Status: No N/A N/A Wound Recurrence: 2x1.1x0.2 N/A N/A Measurements L x W x D (cm) 1.728 N/A N/A A (cm) : rea 0.346 N/A N/A Volume (cm) : 97.20% N/A N/A % Reduction in A rea: 98.60% N/A N/A % Reduction in Volume: Full Thickness Without Exposed N/A N/A Classification: Support Structures Medium N/A N/A Exudate A mount: Serosanguineous N/A N/A Exudate Type: red, brown N/A N/A Exudate Color: Flat and Intact N/A N/A Wound Margin: Large (67-100%) N/A N/A Granulation A mount: Red N/A N/A Granulation Quality: Small (1-33%) N/A N/A Necrotic A mount: Fat Layer (Subcutaneous Tissue): Yes N/A N/A Exposed  Structures: Fascia: No Tendon: No Muscle: No Joint: No Bone: No Large (67-100%) N/A N/A Epithelialization: Debridement - Selective/Open Wound N/A N/A Debridement: Pre-procedure Verification/Time Out 08:42 N/A N/A Taken: Lidocaine 4% Topical Solution N/A N/A Pain Control: Necrotic/Eschar, Slough N/A N/A Tissue Debrided: Non-Viable Tissue N/A N/A Level: 2.2 N/A N/A Debridement A (sq cm): rea Curette N/A N/A Instrument: Minimum N/A N/A Bleeding: Pressure N/A N/A Hemostasis A chieved: Procedure was tolerated well N/A N/A Debridement Treatment Response: 2x1.1x0.2 N/A N/A Post Debridement Measurements L x W x D (cm) 0.346 N/A N/A Post Debridement Volume: (cm) Scarring: Yes N/A N/A Periwound Skin Texture: Maceration: No N/A N/A Periwound Skin Moisture: Dry/Scaly: No No Abnormalities Noted N/A N/A Periwound Skin Color: No Abnormality N/A N/A Temperature: Compression Therapy N/A N/A Procedures Performed: Debridement Treatment Notes Wound #1 (Ankle) Wound Laterality: Left Cleanser Soap and Water Discharge Instruction: May shower and wash wound with dial antibacterial soap and water prior to dressing change. Wound Cleanser Discharge Instruction: Cleanse the wound with wound cleanser prior to applying a clean dressing using gauze sponges, not tissue or cotton balls. Peri-Wound Care Sween Lotion (Moisturizing lotion) Discharge Instruction: Apply moisturizing lotion as directed Topical Gentamicin Discharge Instruction: As directed by physician Primary Dressing Endoform 2x2 in Discharge Instruction: Moisten with saline Secondary Dressing Drawtex 4x4 in Discharge Instruction: Apply over primary dressing as directed. Zetuvit Plus 4x8 in Discharge Instruction: Apply over primary dressing as directed. [name] ([id number]) [id number] Page 4 of 7 Secured With Compression Wrap ThreePress (3 layer compression wrap) Discharge  Instruction: Apply three layer compression as directed. Compression Stockings Compression Stocking Quantity: 1 Left Leg Compression Amount: 30-40 mmHg Right Leg Compression Amount: 30-40 mmHg Discharge Instruction: Apply Compression Stockings daily as instructed. Apply first thing in the morning, remove at night before bed. Add-Ons Electronic Signature(s) Signed: 10/06/2022 9:25:13 AM By: [name] Maudlin MD FACS Entered By: [name] on 10/06/2022 09:25:13 -------------------------------------------------------------------------------- Multi-Disciplinary Care Plan Details Patient Name: Date of Service: [name] 10/06/2022 8:30 A M Medical Record Number: [id number] Patient Account Number: [id number] Date of Birth/Sex: Treating RN: [birth year] (47 y.o. 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 Compression as ordered Treatment Activities: Therapeutic compression applied : 02/19/2022 Notes: Wound/Skin Impairment Nursing Diagnoses: Knowledge deficit related to ulceration/compromised skin integrity Goals: Patient/caregiver will verbalize understanding of skin care regimen Date Initiated: 02/05/2021 Target Resolution Date: 10/30/2022 Goal Status: Active Interventions: Assess patient/caregiver ability to obtain necessary supplies Assess patient/caregiver ability to perform ulcer/skin care regimen upon admission and as  needed Provide education on ulcer and skin care Treatment Activities: Skin care regimen initiated : 02/05/2021 Topical wound management initiated : 02/05/2021 [name] ([id number]) [id number] Page 5 of 7 Notes: 03/31/21: Wound care regimen ongoing, target date extended. 04/21/21: Wound care ongoing, through interpreter patient states he is doing fine with his dressing changes. Electronic Signature(s) Signed: 10/06/2022 4:53:16 PM By: [name] Entered By: [name] on 10/06/2022 08:46:00 -------------------------------------------------------------------------------- Pain Assessment Details Patient Name: Date of Service: [name] NA [place] 10/06/2022 8:30 A M Medical Record Number: [id number] Patient Account Number: [id number] Date of Birth/Sex: Treating RN: [birth year] (47 y.o. [name] Primary Care Danyelle [place]: PA [name], NO Other Clinician: Referring Meziah Blasingame: Treating Basia Mcginty/Extender: [name] in Treatment: 86 Active Problems Location of Pain Severity and Description of Pain Patient Has Paino No Site Locations Rate the pain. Current Pain Level: 0 Pain Management and Medication Current Pain Management: Electronic Signature(s) Signed: 10/06/2022 4:53:16 PM By: [name] Entered By: [name] on 10/06/2022 08:33:13 -------------------------------------------------------------------------------- Patient/Caregiver Education Details Patient Name: Date of Service: [name] 3/6/2024andnbsp8:30 [place] Record Number: [id number] Patient Account Number: [id number] Date of Birth/Gender: Treating RN: 04-09-1976 (47 y.o. [name] Primary Care Physician: PA [name], [place] Other Clinician: Referring Physician: Treating Physician/Extender: [name] in Treatment: 36 Education Assessment Education Provided To: Patient Education  Topics Provided Wound/Skin Impairment: Methods: Explain/Verbal Responses: Reinforcements needed, State content correctly [name] ([id number]) [id number] Page 6 of 7 Electronic Signature(s) Signed: 10/06/2022 4:53:16 PM By: [name] By: [name] on 10/06/2022 09:04:25 -------------------------------------------------------------------------------- Wound Assessment Details Patient Name: Date of Service: [name] NA [place] 10/06/2022 8:30 A M Medical Record Number: [id number] Patient Account Number: [id number] Date of Birth/Sex: Treating RN: 07-Aug-1975 (47 y.o. [name] Primary Care Jose Alleyne: PA [name], NO Other Clinician: Referring Evalina Tabak: Treating Rajah Lamba/Extender: [name] in Treatment: 86 Wound Status Wound Number: 1 Primary Etiology: Trauma, Other Wound Location: Left Ankle Wound Status: Open Wounding Event: Trauma Date Acquired: 10/14/2020 Weeks Of Treatment: 86 Clustered Wound: No Photos Wound Measurements Length: (cm) 2 Width: (cm) 1.1 Depth: (cm) 0.2 Area: (cm) 1.728 Volume: (cm) 0.346 % Reduction in Area: 97.2% % Reduction in Volume: 98.6% Epithelialization: Large (67-100%) Tunneling: No Undermining: No Wound Description Classification: Full Thickness Without Exposed Suppor Wound Margin: Flat and Intact Exudate Amount: Medium Exudate Type: Serosanguineous Exudate Color: red, brown t Structures Foul Odor After Cleansing: No Slough/Fibrino Yes Wound Bed Granulation Amount: Large (67-100%) Exposed Structure Granulation Quality: Red Fascia Exposed: No Necrotic Amount: Small (1-33%) Fat Layer (Subcutaneous Tissue) Exposed: Yes Necrotic Quality: Adherent Slough Tendon Exposed: No Muscle Exposed: No Joint Exposed: No Bone Exposed: No Periwound Skin Texture Texture Color No Abnormalities Noted: No No Abnormalities Noted: Yes Scarring: Yes  Temperature / Pain Temperature: No Abnormality Moisture No Abnormalities Noted: No Dry / Scaly: No Maceration: No [name] ([id number] Page 7 of 7 Treatment Notes Wound #1 (Ankle) Wound Laterality: Left Cleanser Soap and Water Discharge Instruction: May shower and wash wound with dial antibacterial soap and water prior to dressing  change. Wound Cleanser Discharge Instruction: Cleanse the wound with wound cleanser prior to applying a clean dressing using gauze sponges, not tissue or cotton balls. Peri-Wound Care Sween Lotion (Moisturizing lotion) Discharge Instruction: Apply moisturizing lotion as directed Topical Gentamicin Discharge Instruction: As directed by physician Primary Dressing Endoform 2x2 in Discharge Instruction: Moisten with saline Secondary Dressing Drawtex 4x4 in Discharge Instruction: Apply over primary dressing as directed. Zetuvit Plus 4x8 in Discharge Instruction: Apply over primary dressing as directed. Secured With Compression Wrap ThreePress (3 layer compression wrap) Discharge Instruction: Apply three layer compression as directed. Compression Stockings Compression Stocking Quantity: 1 Left Leg Compression Amount: 30-40 mmHg Right Leg Compression Amount: 30-40 mmHg Discharge Instruction: Apply Compression Stockings daily as instructed. Apply first thing in the morning, remove at night before bed. Add-Ons Electronic Signature(s) Signed: 10/06/2022 4:53:16 PM By: [name] By: [name] on 10/06/2022 08:39:50 -------------------------------------------------------------------------------- Vitals Details Patient Name: Date of Service: [name] [name], [name] [place] 10/06/2022 8:30 A M Medical Record Number: [id number] Patient Account Number: [id number] Date of Birth/Sex: Treating RN: Oct 31, 1975 (47 y.o. [name] Primary Care Maya Scholer: PA [name], NO Other Clinician: Referring  Jie Stickels: Treating Edan Juday/Extender: [name] in Treatment: 86 Vital Signs Time Taken: 08:32 Temperature (F): 98.4 Height (in): 69 Pulse (bpm): 58 Weight (lbs): 170 Respiratory Rate (breaths/min): 16 Body Mass Index (BMI): 25.1 Blood Pressure (mmHg): 109/67 Reference Range: 80 - 120 mg / dl Electronic Signature(s) Signed: 10/06/2022 4:53:16 PM By: [name] Entered By: [name] on 10/06/2022 08:33:08

## 2022-10-08 NOTE — Progress Notes (Signed)
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 There is slough on the surface, but good granulation tissue underlying. He is no longer working at the [employer]. 08/06/2022: The wound is a little bit smaller again today. There is thick  slough on the surface, but the surface underneath is less fibrotic. 08/31/2022: The patient has missed a couple of appointments. T oday, his foot and leg are completely macerated. He says that he has been using a plastic bag for showers and that it leaked. The wound is larger, has a thick layer of slough on the surface and is malodorous. 09/07/2022: The wound looks better this week. It is smaller with some slough on the surface. A small satellite wound has opened up just proximal to the main site. 09/15/2022: The wound has a lot more slough on it this week and the satellite wound is larger. He says that he has been standing quite a bit at work. 09/22/2022: The satellite lesion is about the same size but much more superficial. The main wound is a also about the same size, but has a healthier-looking surface. There is slough accumulation on both surfaces. 09/29/2022: The main wound is smaller. The satellite lesion is about the same size. Both have slough on the surface. 10/06/2022: Both wounds are smaller. There is slough and eschar buildup in both sites. Patient History Information obtained from Patient. Family History Unknown History. Social History Current every day smoker, Marital Status - Married, Alcohol Use - Rarely, Drug Use - No History, Caffeine Use - Moderate. Medical A Surgical History Notes nd Gastrointestinal Chronic Gastritis Objective Constitutional Slightly bradycardic. no acute distress. Vitals Time Taken: 8:32 AM, Height: 69 in, Weight: 170 lbs, BMI: 25.1, Temperature: 98.4 F, Pulse: 58 bpm, Respiratory Rate: 16 breaths/min, Blood Pressure: 109/67 mmHg. [name], [name] ([id number]) [id number] Page 10 of 12 Respiratory Normal work of breathing on room air. General Notes: 10/06/2022: Both wounds are smaller. There is slough and eschar buildup in both sites. Integumentary (Hair, Skin) Wound #1 status is Open. Original cause of wound was Trauma. The  date acquired was: 10/14/2020. The wound has been in treatment 86 weeks. The wound is located on the Left Ankle. The wound measures 2cm length x 1.1cm width x 0.2cm depth; 1.728cm^2 area and 0.346cm^3 volume. There is Fat Layer (Subcutaneous Tissue) exposed. There is no tunneling or undermining noted. There is a medium amount of serosanguineous drainage noted. The wound margin is flat and intact. There is large (67-100%) red granulation within the wound bed. There is a small (1-33%) amount of necrotic tissue within the wound bed including Adherent Slough. The periwound skin appearance had no abnormalities noted for color. The periwound skin appearance exhibited: Scarring. The periwound skin appearance did not exhibit: Dry/Scaly, Maceration. Periwound temperature was noted as No Abnormality. 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 There was a Selective/Open Wound Non-Viable Tissue Debridement with a total area of 2.2 sq cm performed by [name], MD. With the following instrument(s): Curette to remove Non-Viable tissue/material. Material removed includes Eschar and Slough and after achieving pain control using Lidocaine 4% Topical Solution. No specimens were taken. A time out was conducted at 08:42, prior to the start of the  procedure. A Minimum amount of bleeding was controlled with Pressure. The procedure was tolerated well. Post Debridement Measurements: 2cm length x 1.1cm width x 0.2cm depth; 0.346cm^3 volume. Character of Wound/Ulcer Post Debridement is improved. Post procedure Diagnosis Wound #1: Same as Pre-Procedure General Notes: scribed for Dr. [name] by [name], RN. Pre-procedure diagnosis of Wound #1 is a Trauma,  Other located on the Left Ankle . There was a Three Layer Compression Therapy Procedure by [name], RN. Post procedure Diagnosis Wound #1: Same as Pre-Procedure Plan Follow-up Appointments: Return Appointment in 1 week. - Dr. [name] - Room 2 Other: - Grandfather interpreter required. Anesthetic: (In clinic) Topical Lidocaine 4% applied to wound bed Bathing/ Shower/ Hygiene: Other Bathing/Shower/Hygiene Orders/Instructions: - Please use a " CAST PROTECTOR" when showering. This can be purchased from [employer], [employer] [place]. Cost is approximately $14-27. Edema Control - Lymphedema / SCD / Other: Avoid standing for long periods of time. Exercise regularly Off-Loading: Open toe surgical shoe to: - left foot The following medication(s) was prescribed: lidocaine topical 4 % cream cream topical was prescribed at facility WOUND #1: - Ankle Wound Laterality: Left Cleanser: Soap and Water 1 x Per Week/30 Days Discharge Instructions: May shower and wash wound with dial antibacterial soap and water prior to dressing change. Cleanser: Wound Cleanser 1 x Per Week/30 Days Discharge Instructions: Cleanse the wound with wound cleanser prior to applying a clean dressing using gauze sponges, not tissue or cotton balls. Peri-Wound Care: Sween Lotion (Moisturizing lotion) 1 x Per Week/30 Days Discharge Instructions: Apply moisturizing lotion as directed Topical: Gentamicin 1 x Per Week/30 Days Discharge Instructions: As directed by physician Prim Dressing: Endoform 2x2 in 1 x Per Week/30 Days ary Discharge Instructions: Moisten with saline Secondary Dressing: Drawtex 4x4 in 1 x Per Week/30 Days Discharge Instructions: Apply over primary dressing as directed. Secondary Dressing: Zetuvit Plus 4x8 in 1 x Per Week/30 Days Discharge Instructions: Apply over primary dressing as directed. Com pression Wrap: ThreePress (3 layer compression wrap) 1 x Per Week/30 Days Discharge  Instructions: Apply three layer compression as directed. Com pression Stockings: Compression Stocking Compression Amount: 30-40 mmHg (left) [name], [name] ([id number] Page 11 of 12 Compression Amount: 30-40 mmHg (right) Discharge Instructions: Apply Compression Stockings daily as instructed. Apply first thing in the morning, remove at night before bed. 10/06/2022: Both wounds are smaller. There is slough and eschar buildup in both sites. I used a curette to debride slough and eschar from the wounds. We will continue topical gentamicin with endoform and 3 layer compression. Follow-up in 1 week. Electronic Signature(s) Signed: 10/06/2022 9:29:18 AM By: [name] MD FACS Entered By: [name] on 10/06/2022 09:29:17 -------------------------------------------------------------------------------- HxROS Details Patient Name: Date of Service: [name] [name], [name] [place] 10/06/2022 8:30 A M Medical Record Number: [id number] Patient Account Number: [id number] Date of Birth/Sex: Treating RN: March 07, 1976 (47 y.o. M) Primary Care Provider: PA [name], NO Other Clinician: Referring Provider: Treating Provider/Extender: [name] in Treatment: 86 Information Obtained From Patient Gastrointestinal Medical History: Past Medical History Notes: Chronic Gastritis Immunizations Pneumococcal Vaccine: Received Pneumococcal Vaccination: No Implantable Devices No devices added Family and Social History Unknown History: Yes; Current every day smoker; Marital Status - Married; Alcohol Use: Rarely; Drug Use: No History; Caffeine Use: Moderate; Financial Concerns: No; Food, Clothing or Shelter Needs: No; Support System Lacking: No; Transportation Concerns: No Electronic Signature(s) Signed: 10/06/2022 12:55:37 PM By: [name] MD FACS Entered By: [name] on 10/06/2022  09:26:46 -------------------------------------------------------------------------------- SuperBill Details  Patient Name: Date of Service: [name] NA [place] 10/06/2022 Medical Record Number: [id number] Patient Account Number: [id number] Date of Birth/Sex: Treating RN: Nov 15, 1975 (47 y.o. M) Primary Care Provider: PA [name], NO Other Clinician: Referring Provider: Treating Provider/Extender: [name] in Treatment: 86 Diagnosis Coding ICD-10 Codes Code Description L97.328 Non-pressure chronic ulcer of left ankle with other specified severity I87.332 Chronic venous hypertension (idiopathic) with ulcer and inflammation of left lower extremity [name], [name] ([id number] Page 12 of 12 Facility Procedures : CPT4 Code: [id number] Description: [phone] - DEBRIDE WOUND 1ST 20 SQ CM OR < ICD-10 Diagnosis Description L97.328 Non-pressure chronic ulcer of left ankle with other specified severity Modifier: Quantity: 1 Physician Procedures : CPT4 Code Description Modifier [id number] - WC PHYS LEVEL 4 - EST PT 25 ICD-10 Diagnosis Description L97.328 Non-pressure chronic ulcer of left ankle with other specified severity I87.332 Chronic venous hypertension (idiopathic) with ulcer and  inflammation of left lower extremity Quantity: 1 : [id number] - WC PHYS DEBR WO ANESTH 20 SQ CM ICD-10 Diagnosis Description L97.328 Non-pressure chronic ulcer of left ankle with other specified severity Quantity: 1 Electronic Signature(s) Signed: 10/06/2022 9:29:59 AM By: [name] MD FACS Entered By: [name] on 10/06/2022 09:29:59

## 2022-10-11 ENCOUNTER — Other Ambulatory Visit: Payer: Medicaid Other

## 2022-10-11 NOTE — Patient Instructions (Signed)
  Medicaid Managed Care   Unsuccessful Outreach Note  10/11/2022 Name: [name] MRN: [id number] DOB: 1975-09-20  Referred by: Patient, No Pcp Per Reason for referral : High Risk Managed Medicaid (MM Social work unsuccessful telephone outreach )   An unsuccessful telephone outreach was attempted today. The patient was referred to the case management team for assistance with care management and care coordination.   Follow Up Plan: The patient has been provided with contact information for the care management team and has been advised to call with any health related questions or concerns.   Mickel Fuchs, BSW, [place] Managed Medicaid Team  [phone]

## 2022-10-11 NOTE — Patient Outreach (Signed)
  Medicaid Managed Care   Unsuccessful Outreach Note  10/11/2022 Name: [name] MRN: [id number] DOB: 08/28/1975  Referred by: Patient, No Pcp Per Reason for referral : High Risk Managed Medicaid (MM Social work unsuccessful telephone outreach )   An unsuccessful telephone outreach was attempted today. The patient was referred to the case management team for assistance with care management and care coordination.   Follow Up Plan: The patient has been provided with contact information for the care management team and has been advised to call with any health related questions or concerns.   [name], BSW, MHA [facility]  [place]  High Risk Managed Medicaid Team  [phone]  

## 2022-10-13 ENCOUNTER — Encounter (HOSPITAL_BASED_OUTPATIENT_CLINIC_OR_DEPARTMENT_OTHER): Payer: Medicaid Other | Admitting: Internal Medicine

## 2022-10-13 DIAGNOSIS — I87332 Chronic venous hypertension (idiopathic) with ulcer and inflammation of left lower extremity: Secondary | ICD-10-CM

## 2022-10-13 DIAGNOSIS — F17200 Nicotine dependence, unspecified, uncomplicated: Secondary | ICD-10-CM | POA: Diagnosis not present

## 2022-10-13 DIAGNOSIS — L97328 Non-pressure chronic ulcer of left ankle with other specified severity: Secondary | ICD-10-CM

## 2022-10-14 NOTE — Progress Notes (Signed)
[name], [name] ([id number]) [id number] Page 1 of 7 Visit Report for 10/13/2022 Arrival Information Details Patient Name: Date of Service: [name] [name] 10/13/2022 11:00 A M Medical Record Number: [id number] Patient Account Number: [id number] Date of Birth/Sex: Treating RN: 09/28/1975 (47 y.o. [name] Primary Care Liller Yohn: PA [name], [name] Other Clinician: Referring [name]: Treating [name]/Extender: Frann Rider in Treatment: 96 Visit Information History Since Last Visit Added or deleted any medications: No Patient Arrived: Ambulatory Any new allergies or adverse reactions: No Arrival Time: 11:20 Had a fall or experienced change in No Accompanied By: interpreter activities of daily living that may affect Transfer Assistance: None risk of falls: Patient Identification Verified: Yes Signs or symptoms of abuse/neglect since last visito No Secondary Verification Process Completed: Yes Hospitalized since last visit: No Patient Requires Transmission-Based Precautions: No Implantable device outside of the clinic excluding No Patient Has Alerts: No cellular tissue based products placed in the center since last visit: Has Compression in Place as Prescribed: Yes Pain Present Now: No Electronic Signature(s) Signed: 10/13/2022 4:29:19 PM By: [name] RN Entered By: [name] on 10/13/2022 11:20:44 -------------------------------------------------------------------------------- Compression Therapy Details Patient Name: Date of Service: [name] 10/13/2022 11:00 A M Medical Record Number: [id number] Patient Account Number: [id number] Date of Birth/Sex: Treating RN: Oct 11, 1975 (47 y.o. [name] Primary Care Jersi Mcmaster: PA [name], [name] Other Clinician: Referring Pollie Poma: Treating [name]/Extender: Frann Rider in Treatment: 87 Compression Therapy Performed for Wound  Assessment: Wound #1 Left Ankle Performed By: Clinician [name], RN Compression Type: Three Layer Post Procedure Diagnosis Same as Pre-procedure Electronic Signature(s) Signed: 10/13/2022 4:29:19 PM By: [name] RN Entered By: [name] on 10/13/2022 11:37:45 -------------------------------------------------------------------------------- Encounter Discharge Information Details Patient Name: Date of Service: [name] NA [place] 10/13/2022 11:00 A M Medical Record Number: [id number] Patient Account Number: [id number] Date of Birth/Sex: Treating RN: [birth year] (46 y.o. [name] Primary Care [name]: PA [name], [name] Other Clinician: Referring [name]: Treating [name]/Extender: Frann Rider in Treatment: 63 Encounter Discharge Information Items Post Procedure Vitals Discharge Condition: Stable Temperature (F): 97.5 Ambulatory Status: Ambulatory Pulse (bpm): 75 Discharge Destination: Home Respiratory Rate (breaths/min): 18 Transportation: Private Auto Blood Pressure (mmHg): 119/70 Accompanied By: interpreter Zipporah Plants ([id number].pdf Page 2 of 7 Schedule Follow-up Appointment: Yes Clinical Summary of Care: Electronic Signature(s) Signed: 10/13/2022 4:29:19 PM By: [name] RN Entered By: [name] on 10/13/2022 11:38:48 -------------------------------------------------------------------------------- Lower Extremity Assessment Details Patient Name: Date of Service: [name] NA [name] 10/13/2022 11:00 A M Medical Record Number: [id number] Patient Account Number: [id number] Date of Birth/Sex: Treating RN: 1976-06-02 (47 y.o. [name] Primary Care Sundance Moise: PA [name], [name] Other Clinician: Referring [name]: Treating [name]/Extender: Frann Rider in Treatment: 87 Edema Assessment Assessed: [Left: No] [Right: No] Edema: [Left: Ye] [Right:  s] Calf Left: Right: Point of Measurement: 28 cm From Medial Instep 30 cm Ankle Left: Right: Point of Measurement: 8 cm From Medial Instep 21 cm Vascular Assessment Pulses: Dorsalis Pedis Palpable: [Left:Yes] Electronic Signature(s) Signed: 10/13/2022 4:29:19 PM By: [name] RN Entered By: [name] on 10/13/2022 11:22:55 -------------------------------------------------------------------------------- Multi Wound Chart Details Patient Name: Date of Service: [name] NA [place] 10/13/2022 11:00 A M Medical Record Number: [id number] Patient Account Number: [id number] Date of Birth/Sex: Treating RN: [birth year] (47 y.o. M) Primary Care [name]: PA [name], NO Other Clinician: Referring Kadin Bera: Treating [name]/Extender: Frann Rider in Treatment:  87 Vital Signs Height(in): 69 Pulse(bpm): 75 Weight(lbs): 170 Blood Pressure(mmHg): 119/70 Body Mass Index(BMI): 25.1 Temperature(F): 97.5 Respiratory Rate(breaths/min): 18 [1:Photos:] [N/A:N/A] Left Ankle N/A N/A Wound Location: Trauma N/A N/A Wounding Event: Venous Leg Ulcer N/A N/A Primary Etiology: 10/14/2020 N/A N/A Date Acquired: 62 N/A N/A Weeks of Treatment: Open N/A N/A Wound Status: No N/A N/A Wound Recurrence: 2x1.4x0.2 N/A N/A Measurements L x W x D (cm) 2.199 N/A N/A A (cm) : rea 0.44 N/A N/A Volume (cm) : 96.40% N/A N/A % Reduction in A rea: 98.20% N/A N/A % Reduction in Volume: Full Thickness Without Exposed N/A N/A Classification: Support Structures Medium N/A N/A Exudate A mount: Serosanguineous N/A N/A Exudate Type: red, brown N/A N/A Exudate Color: Flat and Intact N/A N/A Wound Margin: Large (67-100%) N/A N/A Granulation A mount: Red N/A N/A Granulation Quality: Small (1-33%) N/A N/A Necrotic A mount: Fat Layer (Subcutaneous Tissue): Yes N/A N/A Exposed Structures: Fascia: No Tendon: No Muscle: No Joint: No Bone: No Large (67-100%) N/A  N/A Epithelialization: Debridement - Excisional N/A N/A Debridement: Pre-procedure Verification/Time Out 11:33 N/A N/A Taken: Lidocaine 5% topical ointment N/A N/A Pain Control: Subcutaneous, Slough N/A N/A Tissue Debrided: Skin/Subcutaneous Tissue N/A N/A Level: 2.8 N/A N/A Debridement A (sq cm): rea Curette N/A N/A Instrument: Minimum N/A N/A Bleeding: Pressure N/A N/A Hemostasis A chieved: Procedure was tolerated well N/A N/A Debridement Treatment Response: 2x1.4x0.1 N/A N/A Post Debridement Measurements L x W x D (cm) 0.22 N/A N/A Post Debridement Volume: (cm) Scarring: Yes N/A N/A Periwound Skin Texture: Maceration: No N/A N/A Periwound Skin Moisture: Dry/Scaly: No No Abnormalities Noted N/A N/A Periwound Skin Color: No Abnormality N/A N/A Temperature: Compression Therapy N/A N/A Procedures Performed: Debridement Treatment Notes Wound #1 (Ankle) Wound Laterality: Left Cleanser Soap and Water Discharge Instruction: May shower and wash wound with dial antibacterial soap and water prior to dressing change. Vashe 5.8 (oz) Discharge Instruction: Cleanse the wound with Vashe prior to applying a clean dressing using gauze sponges, not tissue or cotton balls. Peri-Wound Care Sween Lotion (Moisturizing lotion) Discharge Instruction: Apply moisturizing lotion as directed Topical Gentamicin Discharge Instruction: As directed by physician Triamcinolone Discharge Instruction: Apply Triamcinolone as directed Primary Dressing Endoform 2x2 in Discharge Instruction: Moisten with saline Secondary Dressing Drawtex 4x4 in Discharge Instruction: Apply over primary dressing as directed. Zetuvit Plus 4x8 in Lukas, [name] ([id number]) [id number] Page 4 of 7 Discharge Instruction: Apply over primary dressing as directed. Secured With Compression Wrap ThreePress (3 layer compression wrap) Discharge Instruction: Apply three layer  compression as directed. Compression Stockings Add-Ons Electronic Signature(s) Signed: 10/13/2022 11:58:37 AM By: Kalman Shan DO Entered By: Kalman Shan on 10/13/2022 11:51:12 -------------------------------------------------------------------------------- Multi-Disciplinary Care Plan Details Patient Name: Date of Service: [name] NA [place] 10/13/2022 11:00 A M Medical Record Number: [id number] Patient Account Number: [id number] Date of Birth/Sex: Treating RN: 17-Nov-1975 (47 y.o. [name] Primary Care Oluchi Pucci: PA [name], NO Other Clinician: Referring Tomeca Helm: Treating Naftuli Dalsanto/Extender: Frann Rider in Treatment: 35 Multidisciplinary Care Plan reviewed with physician Active Inactive Venous Leg Ulcer Nursing Diagnoses: Actual venous Insuffiency (use after diagnosis is confirmed) Knowledge deficit related to disease process and management Goals: Patient will maintain optimal edema control Date Initiated: 02/19/2022 Target Resolution Date: 10/30/2022 Goal Status: Active Interventions: Assess peripheral edema status every visit. Compression as ordered Treatment Activities: Therapeutic compression applied : 02/19/2022 Notes: Wound/Skin Impairment Nursing Diagnoses: Knowledge deficit related to ulceration/compromised skin integrity Goals: Patient/caregiver will verbalize understanding of skin care regimen Date  Initiated: 02/05/2021 Target Resolution Date: 10/30/2022 Goal Status: Active Interventions: Assess patient/caregiver ability to obtain necessary supplies Assess patient/caregiver ability to perform ulcer/skin care regimen upon admission and as needed Provide education on ulcer and skin care Treatment Activities: Skin care regimen initiated : 02/05/2021 Topical wound management initiated : 02/05/2021 Notes: 03/31/21: Wound care regimen ongoing, target date extended. 04/21/21: Wound care ongoing, through interpreter patient states  he is doing fine with his dressing changes. Mathison, [name] ([id number]) [id number] Page 5 of 7 Electronic Signature(s) Signed: 10/13/2022 3:46:51 PM By: [name] Entered By: [name] on 10/13/2022 11:23:46 -------------------------------------------------------------------------------- Pain Assessment Details Patient Name: Date of Service: [name] NA [place] 10/13/2022 11:00 A M Medical Record Number: [id number] Patient Account Number: [id number] Date of Birth/Sex: Treating RN: 06-May-1976 (47 y.o. [name] Primary Care Nasri Boakye: PA [name], [name] Other Clinician: Referring Alanie Syler: Treating Niquan Charnley/Extender: Frann Rider in Treatment: 87 Active Problems Location of Pain Severity and Description of Pain Patient Has Paino No Site Locations Rate the pain. Current Pain Level: 0 Pain Management and Medication Current Pain Management: Electronic Signature(s) Signed: 10/13/2022 4:29:19 PM By: [name] RN Entered By: [name] on 10/13/2022 11:23:32 -------------------------------------------------------------------------------- Patient/Caregiver Education Details Patient Name: Date of Service: [name] 3/13/2024andnbsp11:00 [place] Record Number: [id number] Patient Account Number: [id number] Date of Birth/Gender: Treating RN: 30-Jan-1976 (47 y.o. [name] Primary Care Physician: PA [name], [name] Other Clinician: Referring Physician: Treating Physician/Extender: Frann Rider in Treatment: 31 Education Assessment Education Provided To: Patient Education Topics Provided Wound/Skin Impairment: Methods: Explain/Verbal Responses: Reinforcements needed, State content correctly Arreola, [name] ([id number]) [id number] Page 6 of 7 Electronic Signature(s) Signed: 10/13/2022 3:46:51 PM By: [name] Entered By:  [name] on 10/13/2022 11:23:57 -------------------------------------------------------------------------------- Wound Assessment Details Patient Name: Date of Service: [name] NA [place] 10/13/2022 11:00 A M Medical Record Number: [id number] Patient Account Number: [id number] Date of Birth/Sex: Treating RN: [birth year] (47 y.o. M) Primary Care Rickey Farrier: PA TIENT, NO Other Clinician: Referring Janaye Corp: Treating Hortensia Duffin/Extender: Frann Rider in Treatment: 87 Wound Status Wound Number: 1 Primary Etiology: Venous Leg Ulcer Wound Location: Left Ankle Wound Status: Open Wounding Event: Trauma Date Acquired: 10/14/2020 Weeks Of Treatment: 87 Clustered Wound: No Photos Wound Measurements Length: (cm) 2 Width: (cm) 1.4 Depth: (cm) 0.2 Area: (cm) 2.199 Volume: (cm) 0.44 % Reduction in Area: 96.4% % Reduction in Volume: 98.2% Epithelialization: Large (67-100%) Wound Description Classification: Full Thickness Without Exposed Suppor Wound Margin: Flat and Intact Exudate Amount: Medium Exudate Type: Serosanguineous Exudate Color: red, brown t Structures Foul Odor After Cleansing: No Slough/Fibrino Yes Wound Bed Granulation Amount: Large (67-100%) Exposed Structure Granulation Quality: Red Fascia Exposed: No Necrotic Amount: Small (1-33%) Fat Layer (Subcutaneous Tissue) Exposed: Yes Necrotic Quality: Adherent Slough Tendon Exposed: No Muscle Exposed: No Joint Exposed: No Bone Exposed: No Periwound Skin Texture Texture Color No Abnormalities Noted: No No Abnormalities Noted: Yes Scarring: Yes Temperature / Pain Temperature: No Abnormality Moisture No Abnormalities Noted: No Dry / Scaly: No Maceration: No Treatment Notes Bisono, [name] ([id number] Page 7 of 7 Wound #1 (Ankle) Wound Laterality: Left Cleanser Soap and Water Discharge Instruction: May shower and wash wound with dial  antibacterial soap and water prior to dressing change. Vashe 5.8 (oz) Discharge Instruction: Cleanse the wound with Vashe prior to applying a clean dressing using gauze sponges, not tissue or cotton balls. Peri-Wound Care Sween Lotion (Moisturizing lotion) Discharge Instruction: Apply moisturizing lotion as directed  Topical Gentamicin Discharge Instruction: As directed by physician Triamcinolone Discharge Instruction: Apply Triamcinolone as directed Primary Dressing Endoform 2x2 in Discharge Instruction: Moisten with saline Secondary Dressing Drawtex 4x4 in Discharge Instruction: Apply over primary dressing as directed. Zetuvit Plus 4x8 in Discharge Instruction: Apply over primary dressing as directed. Secured With Compression Wrap ThreePress (3 layer compression wrap) Discharge Instruction: Apply three layer compression as directed. Compression Stockings Add-Ons Electronic Signature(s) Signed: 10/13/2022 1:30:17 PM By: Worthy Rancher Entered By: Worthy Rancher on 10/13/2022 11:27:30 -------------------------------------------------------------------------------- Vitals Details Patient Name: Date of Service: [name] [name], [name] [place] 10/13/2022 11:00 A M Medical Record Number: [id number] Patient Account Number: [id number] Date of Birth/Sex: Treating RN: Aug 11, 1975 (47 y.o. [name] Primary Care Kartik Fernando: PA [name], [name] Other Clinician: Referring Emrik Erhard: Treating Vertis Bauder/Extender: Frann Rider in Treatment: 25 Vital Signs Time Taken: 11:20 Temperature (F): 97.5 Height (in): 69 Pulse (bpm): 75 Weight (lbs): 170 Respiratory Rate (breaths/min): 18 Body Mass Index (BMI): 25.1 Blood Pressure (mmHg): 119/70 Reference Range: 80 - 120 mg / dl Electronic Signature(s) Signed: 10/13/2022 4:29:19 PM By: [name] RN Entered By: [name] on 10/13/2022 11:23:27

## 2022-10-14 NOTE — Progress Notes (Signed)
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 says that he has been using a plastic bag for showers and that it leaked. The wound  is larger, has a thick layer of slough on the surface and is malodorous. 09/07/2022: The wound looks better this week. It is smaller with some slough on the surface. A small satellite wound has opened up just proximal to the main site. 09/15/2022: The wound has a lot more slough on it this week and the satellite wound is larger. He says that he has been standing quite a bit at work. 09/22/2022: The satellite lesion is about the same size but much more superficial. The main wound is a also about the same size, but has a healthier-looking surface. There is slough accumulation on both surfaces. 09/29/2022: The main wound is smaller. The satellite lesion is about the same size. Both have slough on the surface. 10/06/2022: Both wounds are smaller. There is slough and eschar buildup in both sites. 3/13; patient presents for follow-up. We have been using antibiotic ointment with endoform under 3 layer compression. Patient has no issues or complaints today. Patient History Information obtained from Patient. Family History Unknown History. Social History Current every day smoker, Marital Status - Married, Alcohol Use - Rarely, Drug Use - No History, Caffeine Use - Moderate. Medical A Surgical History Notes nd Gastrointestinal Chronic Gastritis Objective Constitutional respirations regular, non-labored and within target range for patient.. Vitals Time Taken: 11:20 AM, Height: 69 in, Weight: 170 lbs, BMI: 25.1, Temperature: 97.5 F, Pulse: 75 bpm, Respiratory Rate: 18 breaths/min, Blood Pressure: 119/70 mmHg. Cardiovascular 2+ dorsalis pedis/posterior tibialis pulses. Psychiatric pleasant and cooperative. General Notes: 2 wounds to the left ankle with granulation tissue and slough. No signs of surrounding infection. Good edema control. [name], [name] ([id number]) [id number] Page 10 of 12 Integumentary (Hair, Skin) Wound #1 status is Open. Original cause of wound was  Trauma. The date acquired was: 10/14/2020. The wound has been in treatment 87 weeks. The wound is located on the Left Ankle. The wound measures 2cm length x 1.4cm width x 0.2cm depth; 2.199cm^2 area and 0.44cm^3 volume. There is Fat Layer (Subcutaneous Tissue) exposed. There is a medium amount of serosanguineous drainage noted. The wound margin is flat and intact. There is large (67-100%) red granulation within the wound bed. There is a small (1-33%) amount of necrotic tissue within the wound bed including Adherent Slough. The periwound skin appearance had no abnormalities noted for color. The periwound skin appearance exhibited: Scarring. The periwound skin appearance did not exhibit: Dry/Scaly, Maceration. Periwound temperature was noted as No Abnormality. Assessment Active Problems ICD-10 Non-pressure chronic ulcer of left ankle with other specified severity Chronic venous hypertension (idiopathic) with ulcer and inflammation of left lower extremity Patient's wounds appear well-healing. I debrided nonviable tissue. I recommended continuing the course with gentamicin ointment and endoform under 3 layer compression. Follow-up in 1 week. Procedures Wound #1 Pre-procedure diagnosis of Wound #1 is a Venous Leg Ulcer located on the Left Ankle .Severity of Tissue Pre Debridement is: Fat layer exposed. There was a Excisional Skin/Subcutaneous Tissue Debridement with a total area of 2.8 sq cm performed by Kalman Shan, DO. With the following instrument(s): Curette to remove Viable and Non-Viable tissue/material. Material removed includes Subcutaneous Tissue and Slough and after achieving pain control using Lidocaine 5% topical ointment. No specimens were taken. A time out was conducted at 11:33, prior to the start of the procedure. A Minimum amount of bleeding was controlled  with Pressure. The procedure was tolerated well. Post Debridement Measurements: 2cm length x 1.4cm width x 0.1cm depth;  0.22cm^3 volume. Character of Wound/Ulcer Post Debridement requires further debridement. Severity of Tissue Post Debridement is: Fat layer exposed. Post procedure Diagnosis Wound #1: Same as Pre-Procedure General Notes: Scribed for Dr. Heber Perdido by [name], RN. 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 There was a Three Layer Compression Therapy Procedure by [name], RN. Post procedure Diagnosis Wound #1: Same as Pre-Procedure Plan Follow-up Appointments: Return Appointment in 1 week. - Dr. [name] - Room 2 Other: - [place] interpreter required. Anesthetic: (In clinic) Topical Lidocaine 4% applied to wound bed Bathing/ Shower/ Hygiene: Other Bathing/Shower/Hygiene Orders/Instructions: - Please use a " CAST PROTECTOR" when showering. This can be purchased from [employer], [place] supply [place]. Cost is approximately $14-27. Edema Control - Lymphedema / SCD / Other: Avoid standing for long periods of time. Exercise regularly Off-Loading: Open toe surgical shoe to: - left foot WOUND #1: - Ankle Wound Laterality: Left Cleanser: Soap and Water Discharge Instructions: May shower and wash wound with dial antibacterial soap and water prior to dressing change. Cleanser: Vashe 5.8 (oz) Discharge Instructions: Cleanse the wound with Vashe prior to applying a clean dressing using gauze sponges, not tissue or cotton balls. Peri-Wound Care: Sween Lotion (Moisturizing lotion) Discharge Instructions: Apply moisturizing lotion as directed Topical: Gentamicin Discharge Instructions: As directed by physician Topical: Triamcinolone Discharge Instructions: Apply Triamcinolone as directed Prim Dressing: Endoform 2x2 in ary Discharge Instructions: Moisten with saline Secondary Dressing: Drawtex 4x4 in Discharge Instructions: Apply over primary dressing as directed. Secondary Dressing: Zetuvit Plus 4x8 in Discharge Instructions: Apply over  primary dressing as directed. Com pression Wrap: ThreePress (3 layer compression wrap) Discharge Instructions: Apply three layer compression as directed. 1. In office sharp debridement [name], [name] ([id number]) [phone].pdf Page 11 of 12 2. Endoform with antibiotic ointment under 3 layer compressionooleft lower extremity 3. Follow-up in 1 week Electronic Signature(s) Signed: 10/13/2022 11:58:37 AM By: Kalman Shan DO Entered By: Kalman Shan on 10/13/2022 11:53:37 -------------------------------------------------------------------------------- HxROS Details Patient Name: Date of Service: [name], [name] [place] 10/13/2022 11:00 A M Medical Record Number: [id number] Patient Account Number: [id number] Date of Birth/Sex: Treating RN: 1976/05/17 (47 y.o. M) Primary Care Provider: PA [name], NO Other Clinician: Referring Provider: Treating Provider/Extender: Frann Rider in Treatment: 87 Information Obtained From Patient Gastrointestinal Medical History: Past Medical History Notes: Chronic Gastritis Immunizations Pneumococcal Vaccine: Received Pneumococcal Vaccination: No Implantable Devices No devices added Family and Social History Unknown History: Yes; Current every day smoker; Marital Status - Married; Alcohol Use: Rarely; Drug Use: No History; Caffeine Use: Moderate; Financial Concerns: No; Food, Clothing or Shelter Needs: No; Support System Lacking: No; Transportation Concerns: No Electronic Signature(s) Signed: 10/13/2022 11:58:37 AM By: Kalman Shan DO Entered By: Kalman Shan on 10/13/2022 11:52:09 -------------------------------------------------------------------------------- SuperBill Details Patient Name: Date of Service: [name] NA [place] 10/13/2022 Medical Record Number: [id number] Patient Account Number: [id number] Date of Birth/Sex: Treating RN: 27-Oct-1975 (47 y.o. M) Primary Care Provider:  PA [name], NO Other Clinician: Referring Provider: Treating Provider/Extender: Frann Rider in Treatment: 87 Diagnosis Coding ICD-10 Codes Code Description L97.328 Non-pressure chronic ulcer of left ankle with other specified severity I87.332 Chronic venous hypertension (idiopathic) with ulcer and inflammation of left lower extremity Facility Procedures Physician Procedures : CPT4 Code Description Modifier [id number] - WC PHYS SUBQ TISS 20 SQ CM ICD-10 Diagnosis Description L97.328  Non-pressure chronic ulcer of left ankle with other specified severity I87.332 Chronic venous hypertension (idiopathic) with ulcer and  inflammation of left lower extremity Quantity: 1 Electronic Signature(s) Signed: 10/13/2022 11:58:37 AM By: Kalman Shan DO Entered By: Kalman Shan on 10/13/2022 11:53:45

## 2022-10-21 ENCOUNTER — Encounter (HOSPITAL_BASED_OUTPATIENT_CLINIC_OR_DEPARTMENT_OTHER): Payer: Medicaid Other | Admitting: General Surgery

## 2022-10-21 DIAGNOSIS — I87332 Chronic venous hypertension (idiopathic) with ulcer and inflammation of left lower extremity: Secondary | ICD-10-CM | POA: Diagnosis not present

## 2022-10-21 DIAGNOSIS — F17200 Nicotine dependence, unspecified, uncomplicated: Secondary | ICD-10-CM | POA: Diagnosis not present

## 2022-10-21 DIAGNOSIS — L97328 Non-pressure chronic ulcer of left ankle with other specified severity: Secondary | ICD-10-CM | POA: Diagnosis not present

## 2022-10-21 DIAGNOSIS — I872 Venous insufficiency (chronic) (peripheral): Secondary | ICD-10-CM | POA: Diagnosis not present

## 2022-10-21 DIAGNOSIS — L97322 Non-pressure chronic ulcer of left ankle with fat layer exposed: Secondary | ICD-10-CM | POA: Diagnosis not present

## 2022-10-22 NOTE — Progress Notes (Signed)
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 remanent of the wound. Intake nurse tells [place] that we have healed this out but it reopens. He is no longer working 07/21/2022: The wound is smaller  today. There is slough on the surface, but good granulation tissue underlying. He is no longer working at the [employer]. 08/06/2022: The wound is a little bit smaller again today. There is thick slough on the surface, but the surface underneath is less fibrotic. 08/31/2022: The patient has missed a couple of appointments. T oday, his foot and leg are completely macerated. He says that he has been using a plastic bag for showers and that it leaked. The wound is larger, has a thick layer of slough on the surface and is malodorous. 09/07/2022: The wound looks better this week. It is smaller with some slough on the surface. A small satellite wound has opened up just proximal to the main site. 09/15/2022: The wound has a lot more slough on it this week and the satellite wound is larger. He says that he has been standing quite a bit at work. 09/22/2022: The satellite lesion is about the same size but much more superficial. The main wound is a also about the same size, but has a healthier-looking surface. There is slough accumulation on both surfaces. 09/29/2022: The main wound is smaller. The satellite lesion is about the same size. Both have slough on the surface. 10/06/2022: Both wounds are smaller. There is slough and eschar buildup in both sites. 3/13; patient presents for follow-up. We have been using antibiotic ointment with endoform under 3 layer compression. Patient has no issues or complaints today. 10/21/2022: The wound is smaller today. Both the main wound and satellite lesion are about a third smaller than the last time I saw them. There is some slough on both surfaces. Patient History Information obtained from Patient. Family History Unknown History. Social History Current every day smoker, Marital Status - Married, Alcohol Use - Rarely, Drug Use - No History, Caffeine Use - Moderate. Medical A Surgical History Notes nd Gastrointestinal Chronic  Gastritis Objective Constitutional no acute distress. [place], [name] ([id number]) [id number] Page 10 of 12 Vitals Time Taken: 11:17 AM, Height: 69 in, Weight: 170 lbs, BMI: 25.1, Temperature: 97.8 F, Pulse: 64 bpm, Respiratory Rate: 18 breaths/min, Blood Pressure: 112/71 mmHg. Respiratory Normal work of breathing on room air. General Notes: 10/21/2022: The wound is smaller today. Both the main wound and satellite lesion are about a third smaller than the last time I saw them. There is some slough on both surfaces. Integumentary (Hair, Skin) Wound #1 status is Open. Original cause of wound was Trauma. The date acquired was: 10/14/2020. The wound has been in treatment 89 weeks. The wound is located on the Left Ankle. The wound measures 1.7cm length x 1.3cm width x 0.1cm depth; 1.736cm^2 area and 0.174cm^3 volume. There is Fat Layer (Subcutaneous Tissue) exposed. There is no tunneling or undermining noted. There is a medium amount of serosanguineous drainage noted. The wound margin is flat and intact. There is large (67-100%) red granulation within the wound bed. There is a small (1-33%) amount of necrotic tissue within the wound bed including Eschar and Adherent Slough. The periwound skin appearance had no abnormalities noted for moisture. The periwound skin appearance  had no abnormalities noted for color. The periwound skin appearance exhibited: Scarring. Periwound temperature was noted as No Abnormality. 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 There was a Selective/Open Wound Non-Viable Tissue Debridement with a total area of 2.21 sq cm  performed by [name], MD. With the following instrument(s): Curette to remove Non-Viable tissue/material. Material removed includes [facility] after achieving pain control using Lidocaine 4% T opical Solution. No specimens were taken. A time out was conducted at 11:40, prior to the start of the procedure. A Minimum amount of bleeding was controlled with Pressure. The procedure was tolerated well with a pain level of 0 throughout and a pain level of 0 following the procedure. Post Debridement Measurements: 1.7cm length x 1.3cm width x 0.1cm depth; 0.174cm^3 volume. Character of Wound/Ulcer Post Debridement is improved. Severity of Tissue Post Debridement is: Fat layer exposed. Post procedure Diagnosis Wound #1: Same as Pre-Procedure General Notes: scribed for Dr. [name] by Baruch Gouty, RN. 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 There was a Three Layer Compression Therapy Procedure by Baruch Gouty, RN. Post procedure Diagnosis Wound #1: Same as Pre-Procedure Plan Follow-up Appointments: Return Appointment in 1 week. - Dr. [name] - Room 1 Friday 3/29 @ 12:30 pm Other: - [place] interpreter required. Anesthetic: (In clinic) Topical Lidocaine 4% applied to wound bed Bathing/ Shower/ Hygiene: Other Bathing/Shower/Hygiene Orders/Instructions: - Please use a " CAST PROTECTOR" when showering. This can be purchased from [employer], [employer] [place]. Cost is approximately $14-27. Edema Control - Lymphedema / SCD / Other: Avoid standing for long periods of time. Exercise regularly Off-Loading: Open toe surgical shoe to: - left foot WOUND #1: - Ankle Wound Laterality: Left Cleanser: Soap and Water 1 x Per Week/ Discharge Instructions: May shower and wash wound with dial antibacterial soap and water prior to dressing change. Cleanser: Vashe 5.8 (oz) 1 x Per Week/ Discharge Instructions: Cleanse the wound with Vashe prior to applying  a clean dressing using gauze sponges, not tissue or cotton balls. Peri-Wound Care: Sween Lotion (Moisturizing lotion) 1 x Per Week/ Discharge Instructions: Apply moisturizing lotion as directed Topical: Gentamicin 1 x Per Week/ Discharge Instructions: As directed by physician Topical: Triamcinolone 1 x Per Week/ Discharge Instructions: Apply Triamcinolone as directed Prim Dressing: Endoform 2x2 in 1 x Per Week/ ary Discharge Instructions: Moisten with saline Secondary Dressing: Drawtex 4x4 in 1 x Per Week/ Discharge Instructions: Apply over primary dressing as directed. [place], [name] ([id number]) [id number] Page 11 of 12 Secondary Dressing: Zetuvit Plus 4x8 in 1 x Per Week/ Discharge Instructions: Apply over primary dressing as directed. Compression Wrap: ThreePress (3 layer compression wrap) 1 x Per Week/ Discharge Instructions: Apply three layer compression as directed. 10/21/2022: The wound is smaller today. Both the main wound and satellite lesion are about a third smaller than the last time I saw them. There is some slough on both surfaces. I used a curette to debride slough from both surfaces. We will continue topical gentamicin with endoform and 3 layer compression. He will follow-up in 1 week. Electronic Signature(s) Signed: 10/21/2022 12:17:47 PM By: [name] MD FACS Entered By: [name] on 10/21/2022 12:17:46 -------------------------------------------------------------------------------- HxROS Details Patient Name: Date of  Service: [name] NA [place] 10/21/2022 11:15 A M Medical Record Number: [id number] Patient Account Number: [id number] Date of Birth/Sex: Treating RN: Jan 30, 1976 (47 y.o. M) Primary Care Provider: PA [name], NO Other Clinician: Referring Provider: Treating Provider/Extender: [name] in Treatment: 89 Information Obtained From Patient Gastrointestinal Medical History: Past  Medical History Notes: Chronic Gastritis Immunizations Pneumococcal Vaccine: Received Pneumococcal Vaccination: No Implantable Devices No devices added Family and Social History Unknown History: Yes; Current every day smoker; Marital Status - Married; Alcohol Use: Rarely; Drug Use: No History; Caffeine Use: Moderate; Financial Concerns: No; Food, Clothing or Shelter Needs: No; Support System Lacking: No; Transportation Concerns: No Engineer, maintenance) Signed: 10/21/2022 12:41:48 PM By: [name] MD FACS Entered By: [name] on 10/21/2022 12:13:51 -------------------------------------------------------------------------------- SuperBill Details Patient Name: Date of Service: [name] NA [place] 10/21/2022 Medical Record Number: [id number] Patient Account Number: [id number] Date of Birth/Sex: Treating RN: 1975/11/10 (47 y.o. M) Primary Care Provider: PA [name], NO Other Clinician: Referring Provider: Treating Provider/Extender: [name] in Treatment: 89 Diagnosis Coding ICD-10 Codes Code Description L97.328 Non-pressure chronic ulcer of left ankle with other specified severity I87.332 Chronic venous hypertension (idiopathic) with ulcer and inflammation of left lower extremity [name], [name] ([id number] Page 12 of 12 Facility Procedures : CPT4 Code: [id number] Description: [phone] - DEBRIDE WOUND 1ST 20 SQ CM OR < ICD-10 Diagnosis Description L97.328 Non-pressure chronic ulcer of left ankle with other specified severity Modifier: Quantity: 1 Physician Procedures : CPT4 Code Description Modifier [id number] - WC PHYS LEVEL 4 - EST PT 25 ICD-10 Diagnosis Description L97.328 Non-pressure chronic ulcer of left ankle with other specified severity I87.332 Chronic venous hypertension (idiopathic) with ulcer and  inflammation of left lower extremity Quantity: 1 : [id number] - WC PHYS DEBR WO  ANESTH 20 SQ CM ICD-10 Diagnosis Description L97.328 Non-pressure chronic ulcer of left ankle with other specified severity Quantity: 1 Electronic Signature(s) Signed: 10/21/2022 12:19:39 PM By: [name] MD FACS Entered By: [name] on 10/21/2022 12:19:39

## 2022-10-22 NOTE — Progress Notes (Signed)
Burdett, [name] ([id number]) [id number] Page 1 of 8 Visit Report for 10/21/2022 Arrival Information Details Patient Name: Date of Service: [name] [place] NIE 10/21/2022 11:15 A M Medical Record Number: [id number] Patient Account Number: [id number] Date of Birth/Sex: Treating RN: Mar 20, 1976 (47 y.o. [name] Primary Care Arika Mainer: PA [name], [place] Other Clinician: Referring Sloane Palmer: Treating Gal Feldhaus/Extender: [name] in Treatment: 42 Visit Information History Since Last Visit Added or deleted any medications: No Patient Arrived: Ambulatory Any new allergies or adverse reactions: No Arrival Time: 11:17 Had a fall or experienced change in No Accompanied By: interpreter activities of daily living that may affect Transfer Assistance: None risk of falls: Patient Identification Verified: Yes Signs or symptoms of abuse/neglect since last visito No Secondary Verification Process Completed: Yes Hospitalized since last visit: No Patient Requires Transmission-Based Precautions: No Implantable device outside of the clinic excluding No Patient Has Alerts: No cellular tissue based products placed in the center since last visit: Has Dressing in Place as Prescribed: Yes Has Compression in Place as Prescribed: Yes Pain Present Now: No Electronic Signature(s) Signed: 10/21/2022 5:45:28 PM By: Baruch Gouty RN, BSN Entered By: Baruch Gouty on 10/21/2022 11:17:43 -------------------------------------------------------------------------------- Compression Therapy Details Patient Name: Date of Service: [name] 10/21/2022 11:15 A M Medical Record Number: [id number] Patient Account Number: [id number] Date of Birth/Sex: Treating RN: 1976-07-07 (47 y.o. [name] Primary Care Meryl Ponder: PA [name], [place] Other Clinician: Referring Marilynne Dupuis: Treating Dorthy Magnussen/Extender: [name] in  Treatment: 89 Compression Therapy Performed for Wound Assessment: Wound #1 Left Ankle Performed By: Clinician Baruch Gouty, RN Compression Type: Three Layer Post Procedure Diagnosis Same as Pre-procedure Electronic Signature(s) Signed: 10/21/2022 5:45:28 PM By: Baruch Gouty RN, BSN Entered By: Baruch Gouty on 10/21/2022 11:33:47 -------------------------------------------------------------------------------- Encounter Discharge Information Details Patient Name: Date of Service: [name] NA [place] 10/21/2022 11:15 A M Medical Record Number: [id number] Patient Account Number: [id number] Date of Birth/Sex: Treating RN: 1975/11/03 (47 y.o. [name] Primary Care Maelle Sheaffer: PA [name], [place] Other Clinician: Referring Ellsworth Waldschmidt: Treating Jadene Stemmer/Extender: [name] in Treatment: 1 Encounter Discharge Information Items Post Procedure Vitals Discharge Condition: Stable Temperature (F): 97.8 Ambulatory Status: Ambulatory Pulse (bpm): 64 Discharge Destination: Home Respiratory Rate (breaths/min): 18 Transportation: Private Auto Blood Pressure (mmHg): 112/71 Croom, [name] ([id number]) [id number].pdf Page 2 of 8 Accompanied By: interpreter Schedule Follow-up Appointment: Yes Clinical Summary of Care: Patient Declined Electronic Signature(s) Signed: 10/21/2022 5:45:28 PM By: Baruch Gouty RN, BSN Entered By: Baruch Gouty on 10/21/2022 12:06:28 -------------------------------------------------------------------------------- Lower Extremity Assessment Details Patient Name: Date of Service: [name] NA [place] 10/21/2022 11:15 A M Medical Record Number: [id number] Patient Account Number: [id number] Date of Birth/Sex: Treating RN: 1975-08-22 (47 y.o. [name] Primary Care Lorely Bubb: PA [name], [place] Other Clinician: Referring Milianna Ericsson: Treating Larosa Rhines/Extender: [name] in  Treatment: 89 Edema Assessment Assessed: [Left: No] [Right: No] Edema: [Left: Ye] [Right: s] Calf Left: Right: Point of Measurement: 28 cm From Medial Instep 30 cm Ankle Left: Right: Point of Measurement: 8 cm From Medial Instep 21 cm Electronic Signature(s) Signed: 10/21/2022 5:45:28 PM By: Baruch Gouty RN, BSN Entered By: Baruch Gouty on 10/21/2022 11:23:44 -------------------------------------------------------------------------------- Multi Wound Chart Details Patient Name: Date of Service: [name] NA [place] 10/21/2022 11:15 A M Medical Record Number: [id number] Patient Account Number: [id number] Date of Birth/Sex: Treating RN: [birth year] (47 y.o. M) Primary Care Makailah Slavick: PA [name], [place] Other Clinician: Referring Lowry Bala: Treating Bluford Sedler/Extender: [name],  Stephan Minister, Rosalene Billings in Treatment: 89 Vital Signs Height(in): 69 Pulse(bpm): 64 Weight(lbs): 170 Blood Pressure(mmHg): 112/71 Body Mass Index(BMI): 25.1 Temperature(F): 97.8 Respiratory Rate(breaths/min): 18 [1:Photos:] [N/A:N/A] Left Ankle N/A N/A Wound Location: Trauma N/A N/A Wounding Event: Venous Leg Ulcer N/A N/A Primary Etiology: 10/14/2020 N/A N/A Date Acquired: JAD, MAMONE ([id number]) [id number] Page 3 of 8 21 N/A N/A Weeks of Treatment: Open N/A N/A Wound Status: No N/A N/A Wound Recurrence: 1.7x1.3x0.1 N/A N/A Measurements L x W x D (cm) 1.736 N/A N/A A (cm) : rea 0.174 N/A N/A Volume (cm) : 97.20% N/A N/A % Reduction in Area: 99.30% N/A N/A % Reduction in Volume: Full Thickness Without Exposed N/A N/A Classification: Support Structures Medium N/A N/A Exudate A mount: Serosanguineous N/A N/A Exudate Type: red, brown N/A N/A Exudate Color: Flat and Intact N/A N/A Wound Margin: Large (67-100%) N/A N/A Granulation A mount: Red N/A N/A Granulation Quality: Small (1-33%) N/A N/A Necrotic A mount: Eschar, Adherent Slough N/A  N/A Necrotic Tissue: Fat Layer (Subcutaneous Tissue): Yes N/A N/A Exposed Structures: Fascia: No Tendon: No Muscle: No Joint: No Bone: No Medium (34-66%) N/A N/A Epithelialization: Debridement - Selective/Open Wound N/A N/A Debridement: Pre-procedure Verification/Time Out 11:40 N/A N/A Taken: Lidocaine 4% Topical Solution N/A N/A Pain Control: Slough N/A N/A Tissue Debrided: Non-Viable Tissue N/A N/A Level: 2.21 N/A N/A Debridement A (sq cm): rea Curette N/A N/A Instrument: Minimum N/A N/A Bleeding: Pressure N/A N/A Hemostasis A chieved: 0 N/A N/A Procedural Pain: 0 N/A N/A Post Procedural Pain: Procedure was tolerated well N/A N/A Debridement Treatment Response: 1.7x1.3x0.1 N/A N/A Post Debridement Measurements L x W x D (cm) 0.174 N/A N/A Post Debridement Volume: (cm) Scarring: Yes N/A N/A Periwound Skin Texture: Maceration: No N/A N/A Periwound Skin Moisture: Dry/Scaly: No No Abnormalities Noted N/A N/A Periwound Skin Color: No Abnormality N/A N/A Temperature: Compression Therapy N/A N/A Procedures Performed: Debridement Treatment Notes Wound #1 (Ankle) Wound Laterality: Left Cleanser Soap and Water Discharge Instruction: May shower and wash wound with dial antibacterial soap and water prior to dressing change. Vashe 5.8 (oz) Discharge Instruction: Cleanse the wound with Vashe prior to applying a clean dressing using gauze sponges, not tissue or cotton balls. Peri-Wound Care Sween Lotion (Moisturizing lotion) Discharge Instruction: Apply moisturizing lotion as directed Topical Gentamicin Discharge Instruction: As directed by physician Triamcinolone Discharge Instruction: Apply Triamcinolone as directed Primary Dressing Endoform 2x2 in Discharge Instruction: Moisten with saline Secondary Dressing Drawtex 4x4 in Discharge Instruction: Apply over primary dressing as directed. Zetuvit Plus 4x8 in Discharge Instruction: Apply over primary  dressing as directed. Bauza, [name] ([id number]) [id number] Page 4 of 8 Secured With Compression Wrap ThreePress (3 layer compression wrap) Discharge Instruction: Apply three layer compression as directed. Compression Stockings Add-Ons Electronic Signature(s) Signed: 10/21/2022 12:12:53 PM By: [name] MD FACS Entered By: [name] on 10/21/2022 12:12:53 -------------------------------------------------------------------------------- Multi-Disciplinary Care Plan Details Patient Name: Date of Service: [name] NA [place] 10/21/2022 11:15 A M Medical Record Number: [id number] Patient Account Number: [id number] Date of Birth/Sex: Treating RN: May 11, 1976 (47 y.o. [name] Primary Care Konica Stankowski: PA [name], [place] Other Clinician: Referring Denea Cheaney: Treating Marcell Chavarin/Extender: [name] in Treatment: 8 Multidisciplinary Care Plan reviewed with physician Active Inactive Venous Leg Ulcer Nursing Diagnoses: Actual venous Insuffiency (use after diagnosis is confirmed) Knowledge deficit related to disease process and management Goals: Patient will maintain optimal edema control Date Initiated: 02/19/2022 Target Resolution Date: 10/30/2022 Goal Status: Active Interventions: Assess peripheral edema status every visit.  Compression as ordered Treatment Activities: Therapeutic compression applied : 02/19/2022 Notes: Wound/Skin Impairment Nursing Diagnoses: Knowledge deficit related to ulceration/compromised skin integrity Goals: Patient/caregiver will verbalize understanding of skin care regimen Date Initiated: 02/05/2021 Target Resolution Date: 10/30/2022 Goal Status: Active Interventions: Assess patient/caregiver ability to obtain necessary supplies Assess patient/caregiver ability to perform ulcer/skin care regimen upon admission and as needed Provide education on ulcer and skin care Treatment  Activities: Skin care regimen initiated : 02/05/2021 Topical wound management initiated : 02/05/2021 Notes: 03/31/21: Wound care regimen ongoing, target date extended. 04/21/21: Wound care ongoing, through interpreter patient states he is doing fine with his dressing changes. Electronic Signature(s) Thibault, [name] ([id number]) [id number] Page 5 of 8 Signed: 10/21/2022 5:45:28 PM By: Baruch Gouty RN, BSN Entered By: Baruch Gouty on 10/21/2022 11:28:29 -------------------------------------------------------------------------------- Pain Assessment Details Patient Name: Date of Service: [name] NA [place] 10/21/2022 11:15 A M Medical Record Number: [id number] Patient Account Number: [id number] Date of Birth/Sex: Treating RN: February 05, 1976 (47 y.o. [name] Primary Care Laurens Matheny: PA [name], [place] Other Clinician: Referring Heraclio Seidman: Treating Brock Larmon/Extender: [name] in Treatment: 89 Active Problems Location of Pain Severity and Description of Pain Patient Has Paino Yes Site Locations Pain Location: Generalized Pain With Dressing Change: No Duration of the Pain. Constant / Intermittento Intermittent Rate the pain. Current Pain Level: 5 Least Pain Level: 0 Character of Pain Describe the Pain: Aching Pain Management and Medication Current Pain Management: Rest: Yes Is the Current Pain Management Adequate: Adequate How does your wound impact your activities of daily livingo Sleep: No Bathing: No Appetite: No Relationship With Others: No Bladder Continence: No Emotions: No Bowel Continence: No Work: No Toileting: No Drive: No Dressing: No Hobbies: No Electronic Signature(s) Signed: 10/21/2022 5:45:28 PM By: Baruch Gouty RN, BSN Entered By: Baruch Gouty on 10/21/2022 11:23:32 -------------------------------------------------------------------------------- Patient/Caregiver Education Details Patient  Name: Date of Service: [name] 3/21/2024andnbsp11:15 A M Medical Record Number: [id number] Patient Account Number: [id number] Date of Birth/Gender: Treating RN: [birth year] (48 y.o. [name] Primary Care Physician: PA [name], [place] Other Clinician: Referring Physician: Treating Physician/Extender: [name] in Treatment: 20 Education Assessment Education Provided To: JUVENAL, RENNEY ([id number]) [id number] Page 6 of 8 Patient Education Topics Provided Venous: Methods: Explain/Verbal Responses: Reinforcements needed, State content correctly Wound/Skin Impairment: Methods: Explain/Verbal Responses: Reinforcements needed, State content correctly Electronic Signature(s) Signed: 10/21/2022 5:45:28 PM By: Baruch Gouty RN, BSN Entered By: Baruch Gouty on 10/21/2022 11:28:58 -------------------------------------------------------------------------------- Wound Assessment Details Patient Name: Date of Service: [name] NA [place] 10/21/2022 11:15 A M Medical Record Number: [id number] Patient Account Number: [id number] Date of Birth/Sex: Treating RN: [birth year] (47 y.o. [name] Primary Care Cera Rorke: PA [name], [place] Other Clinician: Referring Ramie Palladino: Treating Geniece Akers/Extender: [name] in Treatment: 89 Wound Status Wound Number: 1 Primary Etiology: Venous Leg Ulcer Wound Location: Left Ankle Wound Status: Open Wounding Event: Trauma Date Acquired: 10/14/2020 Weeks Of Treatment: 89 Clustered Wound: No Photos Wound Measurements Length: (cm) 1.7 Width: (cm) 1.3 Depth: (cm) 0.1 Area: (cm) 1.736 Volume: (cm) 0.174 % Reduction in Area: 97.2% % Reduction in Volume: 99.3% Epithelialization: Medium (34-66%) Tunneling: No Undermining: No Wound Description Classification: Full Thickness Without Exposed Suppor Wound Margin: Flat and Intact Exudate Amount:  Medium Exudate Type: Serosanguineous Exudate Color: red, brown t Structures Foul Odor After Cleansing: No Slough/Fibrino Yes Wound Bed Granulation Amount: Large (67-100%) Exposed Structure Granulation Quality: Red Fascia Exposed: No Necrotic Amount: Small (1-33%) Fat Layer (  Subcutaneous Tissue) Exposed: Yes Necrotic Quality: Eschar, Adherent Slough Tendon Exposed: No Muscle Exposed: No Joint Exposed: No Bone Exposed: No Mcconnell, [name] ([id number] Page 7 of 8 Periwound Skin Texture Texture Color No Abnormalities Noted: No No Abnormalities Noted: Yes Scarring: Yes Temperature / Pain Temperature: No Abnormality Moisture No Abnormalities Noted: Yes Treatment Notes Wound #1 (Ankle) Wound Laterality: Left Cleanser Soap and Water Discharge Instruction: May shower and wash wound with dial antibacterial soap and water prior to dressing change. Vashe 5.8 (oz) Discharge Instruction: Cleanse the wound with Vashe prior to applying a clean dressing using gauze sponges, not tissue or cotton balls. Peri-Wound Care Sween Lotion (Moisturizing lotion) Discharge Instruction: Apply moisturizing lotion as directed Topical Gentamicin Discharge Instruction: As directed by physician Triamcinolone Discharge Instruction: Apply Triamcinolone as directed Primary Dressing Endoform 2x2 in Discharge Instruction: Moisten with saline Secondary Dressing Drawtex 4x4 in Discharge Instruction: Apply over primary dressing as directed. Zetuvit Plus 4x8 in Discharge Instruction: Apply over primary dressing as directed. Secured With Compression Wrap ThreePress (3 layer compression wrap) Discharge Instruction: Apply three layer compression as directed. Compression Stockings Add-Ons Electronic Signature(s) Signed: 10/21/2022 5:45:28 PM By: Baruch Gouty RN, BSN Entered By: Baruch Gouty on 10/21/2022  11:26:01 -------------------------------------------------------------------------------- Vitals Details Patient Name: Date of Service: [name] NA [place] 10/21/2022 11:15 A M Medical Record Number: [id number] Patient Account Number: [id number] Date of Birth/Sex: Treating RN: October 09, 1975 (47 y.o. [name] Primary Care Marlaina Coburn: PA [name], [place] Other Clinician: Referring Ahmere Hemenway: Treating Rickey Farrier/Extender: [name] in Treatment: 89 Vital Signs Time Taken: 11:17 Temperature (F): 97.8 Height (in): 69 Pulse (bpm): 64 Weight (lbs): 170 Respiratory Rate (breaths/min): 18 Body Mass Index (BMI): 25.1 Blood Pressure (mmHg): 112/71 Reference Range: 80 - 120 mg / dl Electronic Signature(s) Uphoff, [name] ([id number].pdf Page 8 of 8 Signed: 10/21/2022 5:45:28 PM By: Baruch Gouty RN, BSN Entered By: Baruch Gouty on 10/21/2022 11:18:18

## 2022-10-29 ENCOUNTER — Encounter (HOSPITAL_BASED_OUTPATIENT_CLINIC_OR_DEPARTMENT_OTHER): Payer: Medicaid Other | Admitting: General Surgery

## 2022-10-29 DIAGNOSIS — I87332 Chronic venous hypertension (idiopathic) with ulcer and inflammation of left lower extremity: Secondary | ICD-10-CM | POA: Diagnosis not present

## 2022-10-29 DIAGNOSIS — F17200 Nicotine dependence, unspecified, uncomplicated: Secondary | ICD-10-CM | POA: Diagnosis not present

## 2022-10-29 DIAGNOSIS — L97328 Non-pressure chronic ulcer of left ankle with other specified severity: Secondary | ICD-10-CM | POA: Diagnosis not present

## 2022-10-29 DIAGNOSIS — L97322 Non-pressure chronic ulcer of left ankle with fat layer exposed: Secondary | ICD-10-CM | POA: Diagnosis not present

## 2022-10-29 DIAGNOSIS — I872 Venous insufficiency (chronic) (peripheral): Secondary | ICD-10-CM | POA: Diagnosis not present

## 2022-10-29 NOTE — Progress Notes (Signed)
Chenoweth, [name] ([id number]) [id number] Page 1 of 7 Visit Report for 10/29/2022 Arrival Information Details Patient Name: Date of Service: [name] [place] 10/29/2022 12:30 PM Medical Record Number: [id number] Patient Account Number: [id number] Date of Birth/Sex: Treating RN: April 10, 1976 (47 y.o. [name], [name] Primary Care Stacy Sailer: PA [name], [place] Other Clinician: Referring Indiana Gamero: Treating Rexine Gowens/Extender: [name] in Treatment: 43 Visit Information History Since Last Visit Added or deleted any medications: No Patient Arrived: Ambulatory Any new allergies or adverse reactions: No Arrival Time: 12:32 Had a fall or experienced change in No Accompanied By: interpreter activities of daily living that may affect Transfer Assistance: None risk of falls: Patient Identification Verified: Yes Signs or symptoms of abuse/neglect since last visito No Secondary Verification Process Completed: Yes Hospitalized since last visit: No Patient Requires Transmission-Based Precautions: No Implantable device outside of the clinic excluding No Patient Has Alerts: No cellular tissue based products placed in the center since last visit: Has Dressing in Place as Prescribed: Yes Has Compression in Place as Prescribed: Yes Pain Present Now: Yes Electronic Signature(s) Signed: 10/29/2022 1:23:32 PM By: Baruch Gouty RN, BSN Entered By: Baruch Gouty on 10/29/2022 12:33:18 -------------------------------------------------------------------------------- Compression Therapy Details Patient Name: Date of Service: [name] NA [place] 10/29/2022 12:30 PM Medical Record Number: [id number] Patient Account Number: [id number] Date of Birth/Sex: Treating RN: [birth year] (47 y.o. [name] Primary Care Saira Kramme: PA [name], [place] Other Clinician: Referring Sharry Beining: Treating Emary Zalar/Extender: [name] in  Treatment: 90 Compression Therapy Performed for Wound Assessment: Wound #1 Left Ankle Performed By: Clinician Baruch Gouty, RN Compression Type: Three Layer Post Procedure Diagnosis Same as Pre-procedure Electronic Signature(s) Signed: 10/29/2022 1:23:32 PM By: Baruch Gouty RN, BSN Entered By: Baruch Gouty on 10/29/2022 12:59:18 -------------------------------------------------------------------------------- Encounter Discharge Information Details Patient Name: Date of Service: [name] NA [place] 10/29/2022 12:30 PM Medical Record Number: [id number] Patient Account Number: [id number] Date of Birth/Sex: Treating RN: 20-Sep-1975 (47 y.o. [name] Primary Care Johnluke Haugen: PA [name], [place] Other Clinician: Referring Antinio Sanderfer: Treating Larue Lightner/Extender: [name] in Treatment: 77 Encounter Discharge Information Items Post Procedure Vitals Discharge Condition: Stable Temperature (F): 98.4 Ambulatory Status: Ambulatory Pulse (bpm): 60 Discharge Destination: Home Respiratory Rate (breaths/min): 18 Transportation: Private Auto Blood Pressure (mmHg): 114/64 Boggio, [name] ([id number]) [phone].pdf Page 2 of 7 Accompanied By: interpreter Schedule Follow-up Appointment: Yes Clinical Summary of Care: Patient Declined Electronic Signature(s) Signed: 10/29/2022 1:23:32 PM By: Baruch Gouty RN, BSN Entered By: Baruch Gouty on 10/29/2022 13:23:06 -------------------------------------------------------------------------------- Lower Extremity Assessment Details Patient Name: Date of Service: [name] NA [place] 10/29/2022 12:30 PM Medical Record Number: [id number] Patient Account Number: [id number] Date of Birth/Sex: Treating RN: 30-Sep-1975 (47 y.o. [name] Primary Care Tyreque Finken: PA [name], [place] Other Clinician: Referring Shamonica Schadt: Treating Cecily Lawhorne/Extender: [name] in  Treatment: 90 Edema Assessment Assessed: [Left: No] [Right: No] Edema: [Left: Ye] [Right: s] Calf Left: Right: Point of Measurement: 28 cm From Medial Instep 30 cm Ankle Left: Right: Point of Measurement: 8 cm From Medial Instep 21.5 cm Vascular Assessment Pulses: Dorsalis Pedis Palpable: [Left:No] Electronic Signature(s) Signed: 10/29/2022 1:23:32 PM By: Baruch Gouty RN, BSN Entered By: Baruch Gouty on 10/29/2022 12:37:36 -------------------------------------------------------------------------------- Multi Wound Chart Details Patient Name: Date of Service: [name] NA [place] 10/29/2022 12:30 PM Medical Record Number: [id number] Patient Account Number: [id number] Date of Birth/Sex: Treating RN: Dec 03, 1975 (47 y.o. M) Primary Care Adolfo Granieri: PA [name], [place] Other Clinician: Referring Remona Boom: Treating  Gwyndolyn Guilford/Extender: [name] in Treatment: 90 Vital Signs Height(in): 69 Pulse(bpm): 60 Weight(lbs): 170 Blood Pressure(mmHg): 114/64 Body Mass Index(BMI): 25.1 Temperature(F): 98.4 Respiratory Rate(breaths/min): 18 [1:Photos:] [N/A:N/A] Left Ankle N/A N/A Wound Location: Trauma N/A N/A Wounding Event: Venous Leg Ulcer N/A N/A Primary Etiology: 10/14/2020 N/A N/A Date Acquired: 63 N/A N/A Weeks of Treatment: Open N/A N/A Wound Status: No N/A N/A Wound Recurrence: 0.9x1.3x0.2 N/A N/A Measurements L x W x D (cm) 0.919 N/A N/A A (cm) : rea 0.184 N/A N/A Volume (cm) : 98.50% N/A N/A % Reduction in A rea: 99.30% N/A N/A % Reduction in Volume: Full Thickness Without Exposed N/A N/A Classification: Support Structures Medium N/A N/A Exudate A mount: Serous N/A N/A Exudate Type: amber N/A N/A Exudate Color: Flat and Intact N/A N/A Wound Margin: Large (67-100%) N/A N/A Granulation A mount: Red N/A N/A Granulation Quality: Small (1-33%) N/A N/A Necrotic A mount: Fat Layer (Subcutaneous Tissue): Yes N/A N/A Exposed  Structures: Fascia: No Tendon: No Muscle: No Joint: No Bone: No Small (1-33%) N/A N/A Epithelialization: Debridement - Selective/Open Wound N/A N/A Debridement: Pre-procedure Verification/Time Out 13:00 N/A N/A Taken: Lidocaine 4% Topical Solution N/A N/A Pain Control: Necrotic/Eschar, Slough N/A N/A Tissue Debrided: Non-Viable Tissue N/A N/A Level: 1.17 N/A N/A Debridement A (sq cm): rea Curette N/A N/A Instrument: Minimum N/A N/A Bleeding: Pressure N/A N/A Hemostasis A chieved: 0 N/A N/A Procedural Pain: 0 N/A N/A Post Procedural Pain: Procedure was tolerated well N/A N/A Debridement Treatment Response: 0.9x1.3x0.2 N/A N/A Post Debridement Measurements L x W x D (cm) 0.184 N/A N/A Post Debridement Volume: (cm) Scarring: Yes N/A N/A Periwound Skin Texture: Maceration: No N/A N/A Periwound Skin Moisture: Dry/Scaly: No No Abnormalities Noted N/A N/A Periwound Skin Color: No Abnormality N/A N/A Temperature: Compression Therapy N/A N/A Procedures Performed: Debridement Treatment Notes Electronic Signature(s) Signed: 10/29/2022 1:06:35 PM By: [name] MD FACS Entered By: [name] on 10/29/2022 13:06:35 -------------------------------------------------------------------------------- Multi-Disciplinary Care Plan Details Patient Name: Date of Service: [name] NA [place] 10/29/2022 12:30 PM Medical Record Number: [id number] Patient Account Number: [id number] Date of Birth/Sex: Treating RN: 06-18-1976 (47 y.o. [name] Primary Care Karishma Unrein: PA [name], [place] Other Clinician: Referring Rudie Sermons: Treating Gola Bribiesca/Extender: [name] in Treatment: 45 [place] reviewed with physician Active Inactive Venous Leg Ulcer Snowden, [name] ([id number]) [phone].pdf Page 4 of 7 Nursing Diagnoses: Actual venous Insuffiency (use after diagnosis is confirmed) Knowledge  deficit related to disease process and management Goals: Patient will maintain optimal edema control Date Initiated: 02/19/2022 Target Resolution Date: 11/26/2022 Goal Status: Active Interventions: Assess peripheral edema status every visit. Compression as ordered Treatment Activities: Therapeutic compression applied : 02/19/2022 Notes: Wound/Skin Impairment Nursing Diagnoses: Knowledge deficit related to ulceration/compromised skin integrity Goals: Patient/caregiver will verbalize understanding of skin care regimen Date Initiated: 02/05/2021 Target Resolution Date: 11/26/2022 Goal Status: Active Interventions: Assess patient/caregiver ability to obtain necessary supplies Assess patient/caregiver ability to perform ulcer/skin care regimen upon admission and as needed Provide education on ulcer and skin care Treatment Activities: Skin care regimen initiated : 02/05/2021 Topical wound management initiated : 02/05/2021 Notes: 03/31/21: Wound care regimen ongoing, target date extended. 04/21/21: Wound care ongoing, through interpreter patient states he is doing fine with his dressing changes. Electronic Signature(s) Signed: 10/29/2022 1:23:32 PM By: Baruch Gouty RN, BSN Entered By: Baruch Gouty on 10/29/2022 12:57:30 -------------------------------------------------------------------------------- Pain Assessment Details Patient Name: Date of Service: [name] NA [place] 10/29/2022 12:30 PM Medical Record Number: [id number]  Patient Account Number: [id number] Date of Birth/Sex: Treating RN: Oct 11, 1975 (47 y.o. [name] Primary Care Nickolaos Brallier: PA [name], [place] Other Clinician: Referring Deneane Stifter: Treating Clorissa Gruenberg/Extender: [name] in Treatment: 90 Active Problems Location of Pain Severity and Description of Pain Patient Has Paino Yes Site Locations Pain Location: Clingerman, [name] ([id number]) [id number].pdf Page 5 of  7 Pain Location: Generalized Pain With Dressing Change: No Duration of the Pain. Constant / Intermittento Intermittent Rate the pain. Current Pain Level: 2 Character of Pain Describe the Pain: Other: sore Pain Management and Medication Current Pain Management: Is the Current Pain Management Adequate: Adequate How does your wound impact your activities of daily livingo Sleep: No Bathing: No Appetite: No Relationship With Others: No Bladder Continence: No Emotions: No Bowel Continence: No Work: No Toileting: No Drive: No Dressing: No Hobbies: No Electronic Signature(s) Signed: 10/29/2022 1:23:32 PM By: Baruch Gouty RN, BSN Entered By: Baruch Gouty on 10/29/2022 12:34:38 -------------------------------------------------------------------------------- Patient/Caregiver Education Details Patient Name: Date of Service: [name] 3/29/2024andnbsp12:30 PM Medical Record Number: [id number] Patient Account Number: [id number] Date of Birth/Gender: Treating RN: 07-08-1976 (47 y.o. [name] Primary Care Physician: PA [name], [place] Other Clinician: Referring Physician: Treating Physician/Extender: [name] in Treatment: 43 Education Assessment Education Provided To: Patient Education Topics Provided Venous: Methods: Explain/Verbal Responses: Reinforcements needed, State content correctly Wound/Skin Impairment: Methods: Explain/Verbal Responses: Reinforcements needed, State content correctly Electronic Signature(s) Signed: 10/29/2022 1:23:32 PM By: Baruch Gouty RN, BSN Entered By: Baruch Gouty on 10/29/2022 12:58:32 Causby, [name] ([id number]) [id number] Page 6 of 7 -------------------------------------------------------------------------------- Wound Assessment Details Patient Name: Date of Service: [name] NA [place] 10/29/2022 12:30 PM Medical Record Number: [id number] Patient  Account Number: [id number] Date of Birth/Sex: Treating RN: [birth year] (47 y.o. [name] Primary Care Naje Rice: PA [name], [place] Other Clinician: Referring Payne Garske: Treating Chrisha Vogel/Extender: [name] in Treatment: 90 Wound Status Wound Number: 1 Primary Etiology: Venous Leg Ulcer Wound Location: Left Ankle Wound Status: Open Wounding Event: Trauma Date Acquired: 10/14/2020 Weeks Of Treatment: 90 Clustered Wound: No Photos Wound Measurements Length: (cm) 0.9 Width: (cm) 1.3 Depth: (cm) 0.2 Area: (cm) 0.919 Volume: (cm) 0.184 % Reduction in Area: 98.5% % Reduction in Volume: 99.3% Epithelialization: Small (1-33%) Tunneling: No Undermining: No Wound Description Classification: Full Thickness Without Exposed Support Structures Wound Margin: Flat and Intact Exudate Amount: Medium Exudate Type: Serous Exudate Color: amber Foul Odor After Cleansing: No Slough/Fibrino Yes Wound Bed Granulation Amount: Large (67-100%) Exposed Structure Granulation Quality: Red Fascia Exposed: No Necrotic Amount: Small (1-33%) Fat Layer (Subcutaneous Tissue) Exposed: Yes Necrotic Quality: Adherent Slough Tendon Exposed: No Muscle Exposed: No Joint Exposed: No Bone Exposed: No Periwound Skin Texture Texture Color No Abnormalities Noted: No No Abnormalities Noted: Yes Scarring: Yes Temperature / Pain Temperature: No Abnormality Moisture No Abnormalities Noted: Yes Treatment Notes Wound #1 (Ankle) Wound Laterality: Left Cleanser Soap and Water Discharge Instruction: May shower and wash wound with dial antibacterial soap and water prior to dressing change. Vashe 5.8 (oz) Discharge Instruction: Cleanse the wound with Vashe prior to applying a clean dressing using gauze sponges, not tissue or cotton balls. Peri-Wound Care Flis, [name] ([id number]) [id number] Page 7 of 7 Sween Lotion (Moisturizing lotion) Discharge  Instruction: Apply moisturizing lotion as directed Topical Gentamicin Discharge Instruction: As directed by physician Primary Dressing Endoform 2x2 in Discharge Instruction: Moisten with saline Secondary Dressing Drawtex 4x4 in Discharge Instruction: Apply over primary dressing as directed. Woven Gauze Sponge, Non-Sterile  4x4 in Discharge Instruction: Apply over primary dressing as directed. Secured With Compression Wrap ThreePress (3 layer compression wrap) Discharge Instruction: Apply three layer compression as directed. Compression Stockings Add-Ons Electronic Signature(s) Signed: 10/29/2022 1:23:32 PM By: Baruch Gouty RN, BSN Entered By: Baruch Gouty on 10/29/2022 12:44:35 -------------------------------------------------------------------------------- Vitals Details Patient Name: Date of Service: [name] NA [place] 10/29/2022 12:30 PM Medical Record Number: [id number] Patient Account Number: [id number] Date of Birth/Sex: Treating RN: 05-31-1976 (47 y.o. [name] Primary Care Kathryn Cosby: PA [name], [place] Other Clinician: Referring Dannon Perlow: Treating Ilynn Stauffer/Extender: [name] in Treatment: 90 Vital Signs Time Taken: 12:33 Temperature (F): 98.4 Height (in): 69 Pulse (bpm): 60 Weight (lbs): 170 Respiratory Rate (breaths/min): 18 Body Mass Index (BMI): 25.1 Blood Pressure (mmHg): 114/64 Reference Range: 80 - 120 mg / dl Electronic Signature(s) Signed: 10/29/2022 1:23:32 PM By: Baruch Gouty RN, BSN Entered By: Baruch Gouty on 10/29/2022 12:33:42

## 2022-10-29 NOTE — Progress Notes (Signed)
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 light layer of slough on the surface, but the drainage on his dressing has the typical blue-green color of Pseudomonas. 12/12; this is a patient to be admitted earlier in the year with an extensive wound across the left lateral ankle and into the anterior ankle. Initially an immigrant from the [place]. He speaks [place] leads and we interviewed  him through an interpreter. He has been using endoform on the remanent of the wound. Intake nurse tells [place] that we have healed this out but it reopens. He is no longer working 07/21/2022: The wound is smaller today. There is slough on the surface, but good granulation tissue underlying. He is no longer working at the [employer]. 08/06/2022: The wound is a little bit smaller again today. There is thick slough on the surface, but the surface underneath is less fibrotic. 08/31/2022: The patient has missed a couple of appointments. T oday, his foot and leg are completely macerated. He says that he has been using a plastic bag for showers and that it leaked. The wound is larger, has a thick layer of slough on the surface and is malodorous. 09/07/2022: The wound looks better this week. It is smaller with some slough on the surface. A small satellite wound has opened up just proximal to the main site. 09/15/2022: The wound has a lot more slough on it this week and the satellite wound is larger. He says that he has been standing quite a bit at work. 09/22/2022: The satellite lesion is about the same size but much more superficial. The main wound is a also about the same size, but has a healthier-looking surface. There is slough accumulation on both surfaces. 09/29/2022: The main wound is smaller. The satellite lesion is about the same size. Both have slough on the surface. 10/06/2022: Both wounds are smaller. There is slough and eschar buildup in both sites. 3/13; patient presents for follow-up. We have been using antibiotic ointment with endoform under 3 layer compression. Patient has no issues or complaints today. 10/21/2022: The wound is smaller today. Both the main wound and satellite lesion are about a third smaller than the last time I saw them. There is some slough on both surfaces. 10/29/2022: The satellite lesion has closed. The main wound is smaller again today with just some slough and eschar  present. Patient History Information obtained from Patient. Family History Unknown History. Social History Current every day smoker, Marital Status - Married, Alcohol Use - Rarely, Drug Use - No History, Caffeine Use - Moderate. Medical A Surgical History Notes nd Gastrointestinal Chronic Gastritis Objective Candler, [name] ([id number]) [id number] Page 10 of 12 Constitutional no acute distress. Vitals Time Taken: 12:33 PM, Height: 69 in, Weight: 170 lbs, BMI: 25.1, Temperature: 98.4 F, Pulse: 60 bpm, Respiratory Rate: 18 breaths/min, Blood Pressure: 114/64 mmHg. Respiratory Normal work of breathing on room air. General Notes: 10/29/2022: The satellite lesion has closed. The main wound is smaller again today with just some slough and eschar present. Integumentary (Hair, Skin) Wound #1 status is Open. Original cause of wound was Trauma. The date acquired was: 10/14/2020. The wound has been in treatment 90 weeks. The wound is located on the Left Ankle. The wound measures 0.9cm length x 1.3cm width x 0.2cm depth; 0.919cm^2 area and 0.184cm^3 volume. There is Fat Layer (Subcutaneous Tissue) exposed. There is no tunneling or undermining noted. There is a medium amount of serous drainage noted. The wound margin is flat  and intact. There is large (67-100%) red granulation within the wound bed. There is a small (1-33%) amount of necrotic tissue within the wound bed including Adherent Slough. The periwound skin appearance had no abnormalities noted for moisture. The periwound skin appearance had no abnormalities noted for color. The periwound skin appearance exhibited: Scarring. Periwound temperature was noted as No Abnormality. 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 There was a Selective/Open Wound Non-Viable Tissue Debridement with a total area of 1.17 sq cm performed by [name], MD. With the following instrument(s): Curette Material removed includes Eschar and Slough and after achieving pain control using Lidocaine 4% T opical Solution. No specimens were taken. A time out was conducted at 13:00, prior to the start of the procedure. A Minimum amount of bleeding was controlled with Pressure. The procedure was tolerated well with a pain level of 0 throughout and a pain level of 0 following the procedure. Post Debridement Measurements: 0.9cm length x 1.3cm width x 0.2cm depth; 0.184cm^3 volume. Character of Wound/Ulcer Post Debridement is improved. Severity of Tissue Post Debridement is: Fat layer exposed. Post procedure Diagnosis Wound #1: Same as Pre-Procedure General Notes: Scribed for Dr. Celine [name] by [name] Gouty, RN. 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 There was a Three Layer Compression Therapy Procedure by [name] Gouty, RN. Post procedure Diagnosis Wound #1: Same as Pre-Procedure Plan Follow-up Appointments: Return Appointment in 1 week. - Dr. Celine [name] - Room 1 Friday 4/5 @ 10:30 am Other: - [place] interpreter required. Anesthetic: (In clinic) Topical Lidocaine 4% applied to wound bed Bathing/ Shower/ Hygiene: Other Bathing/Shower/Hygiene Orders/Instructions: - Please use a " CAST PROTECTOR" when showering. This can be purchased from [employer], [place] [employer] [place]. Cost is approximately $14-27. Edema Control - Lymphedema / SCD / Other: Elevate legs to the level of the heart or above for 30 minutes daily and/or when sitting for 3-4 times a day throughout the day. Avoid standing for long periods of time. Exercise regularly Off-Loading: Open toe surgical  shoe to: - left foot WOUND #1: - Ankle Wound Laterality: Left Cleanser: Soap and Water 1 x Per Week/ Discharge Instructions: May shower and wash wound with dial antibacterial soap and water prior to dressing change. Cleanser: Vashe 5.8 (oz) 1 x Per Week/ Discharge Instructions: Cleanse the wound with Vashe prior to applying a clean dressing using gauze sponges, not tissue or cotton balls. Peri-Wound Care: Sween Lotion (Moisturizing lotion) 1 x Per Week/ Discharge Instructions: Apply moisturizing lotion as directed Topical: Gentamicin 1 x Per Week/ Discharge Instructions: As directed by physician Prim Dressing: Endoform 2x2 in 1 x Per Week/ ary Discharge Instructions: Moisten with saline Secondary Dressing: Drawtex 4x4 in 1 x Per Week/ Shreiner, [name] ([id number]) [phone].pdf Page 11 of 12 Discharge Instructions: Apply over primary dressing as directed. Secondary Dressing: Woven Gauze Sponge, Non-Sterile 4x4 in 1 x Per Week/ Discharge Instructions: Apply over primary dressing as directed. Compression Wrap: ThreePress (3 layer compression wrap) 1 x Per Week/ Discharge Instructions: Apply three layer compression as directed. 10/29/2022: The satellite lesion has closed. The main wound is smaller again today with just some slough and eschar present. I used a curette to debride slough and  eschar from the wound. We will continue topical gentamicin with endoform and 3 layer compression. Follow-up in 1 week. Electronic Signature(s) Signed: 11/04/2022 2:09:30 PM By: [name] MD FACS Signed: 11/04/2022 4:19:15 PM By: Deon Pilling RN, BSN Previous Signature: 10/29/2022 1:08:25 PM Version By: [name] MD FACS Entered By: Deon Pilling on 11/04/2022 13:53:33 -------------------------------------------------------------------------------- HxROS Details Patient Name: Date of Service: [name] NA [place] 10/29/2022 12:30 PM Medical Record Number:  [id number] Patient Account Number: [id number] Date of Birth/Sex: Treating RN: April 03, 1976 (47 y.o. M) Primary Care Provider: PA [name], NO Other Clinician: Referring Provider: Treating Provider/Extender: [name] in Treatment: 90 Information Obtained From Patient Gastrointestinal Medical History: Past Medical History Notes: Chronic Gastritis Immunizations Pneumococcal Vaccine: Received Pneumococcal Vaccination: No Implantable Devices No devices added Family and Social History Unknown History: Yes; Current every day smoker; Marital Status - Married; Alcohol Use: Rarely; Drug Use: No History; Caffeine Use: Moderate; Financial Concerns: No; Food, Clothing or Shelter Needs: No; Support System Lacking: No; Transportation Concerns: No Electronic Signature(s) Signed: 10/29/2022 1:12:54 PM By: [name] MD FACS Entered By: [name] on 10/29/2022 13:07:24 -------------------------------------------------------------------------------- SuperBill Details Patient Name: Date of Service: [name] 10/29/2022 Medical Record Number: [id number] Patient Account Number: [id number] Date of Birth/Sex: Treating RN: 1976/04/11 (47 y.o. M) Primary Care Provider: PA [name], NO Other Clinician: Referring Provider: Treating Provider/Extender: [name] in Treatment: 90 Diagnosis Coding ICD-10 Codes Code Description Woehl, [name] ([id number]) [id number] Page 12 of 12 L97.328 Non-pressure chronic ulcer of left ankle with other specified severity I87.332 Chronic venous hypertension (idiopathic) with ulcer and inflammation of left lower extremity Facility Procedures : CPT4 Code: [id number] Description: [id number] - DEBRIDE WOUND 1ST 20 SQ CM OR < ICD-10 Diagnosis Description L97.328 Non-pressure chronic ulcer of left ankle with other specified severity Modifier: Quantity: 1 Physician Procedures :  CPT4 Code Description Modifier [id number] - WC PHYS LEVEL 4 - EST PT 25 ICD-10 Diagnosis Description L97.328 Non-pressure chronic ulcer of left ankle with other specified severity I87.332 Chronic venous hypertension (idiopathic) with ulcer and  inflammation of left lower extremity Quantity: 1 : [id number] - WC PHYS DEBR WO ANESTH 20 SQ CM ICD-10 Diagnosis Description L97.328 Non-pressure chronic ulcer of left ankle with other specified severity Quantity: 1 Electronic Signature(s) Signed: 10/29/2022 1:08:41 PM By: [name] MD FACS Entered By: [name] on 10/29/2022 13:08:41

## 2022-11-01 DIAGNOSIS — Z419 Encounter for procedure for purposes other than remedying health state, unspecified: Secondary | ICD-10-CM | POA: Diagnosis not present

## 2022-11-03 ENCOUNTER — Other Ambulatory Visit: Payer: Medicaid Other | Admitting: *Deleted

## 2022-11-03 NOTE — Patient Outreach (Signed)
  Medicaid Managed Care   Unsuccessful Attempt Note   11/03/2022 Name: [name] MRN: [id number] DOB: 1976/05/24  Referred by: Patient, No Pcp Per Reason for referral : High Risk Managed Medicaid (Unsuccessful RNCM follow up outreach)   An unsuccessful telephone outreach was attempted today. The patient was referred to the case management team for assistance with care management and care coordination.    Message sent to Dr. [name], requesting she encourage patient to attend new patient appointment with PCP on 11/05/22 @ 1:20 pm.  Follow Up Plan: The Managed Medicaid care management team will reach out to the patient again over the next 7 days.    [name] RN, BSN [facility] RN Care Coordinator [phone]

## 2022-11-05 ENCOUNTER — Ambulatory Visit: Payer: Medicaid Other | Admitting: Family

## 2022-11-05 ENCOUNTER — Encounter (HOSPITAL_BASED_OUTPATIENT_CLINIC_OR_DEPARTMENT_OTHER): Payer: Medicaid Other | Attending: General Surgery | Admitting: General Surgery

## 2022-11-05 DIAGNOSIS — T708XXD Other effects of air pressure and water pressure, subsequent encounter: Secondary | ICD-10-CM | POA: Insufficient documentation

## 2022-11-05 DIAGNOSIS — L97322 Non-pressure chronic ulcer of left ankle with fat layer exposed: Secondary | ICD-10-CM | POA: Diagnosis not present

## 2022-11-05 DIAGNOSIS — L97529 Non-pressure chronic ulcer of other part of left foot with unspecified severity: Secondary | ICD-10-CM | POA: Diagnosis not present

## 2022-11-05 DIAGNOSIS — F17200 Nicotine dependence, unspecified, uncomplicated: Secondary | ICD-10-CM | POA: Insufficient documentation

## 2022-11-05 DIAGNOSIS — L97328 Non-pressure chronic ulcer of left ankle with other specified severity: Secondary | ICD-10-CM | POA: Diagnosis not present

## 2022-11-05 DIAGNOSIS — I872 Venous insufficiency (chronic) (peripheral): Secondary | ICD-10-CM | POA: Diagnosis not present

## 2022-11-05 DIAGNOSIS — I87332 Chronic venous hypertension (idiopathic) with ulcer and inflammation of left lower extremity: Secondary | ICD-10-CM | POA: Diagnosis not present

## 2022-11-05 DIAGNOSIS — M85872 Other specified disorders of bone density and structure, left ankle and foot: Secondary | ICD-10-CM | POA: Diagnosis not present

## 2022-11-05 DIAGNOSIS — X58XXXD Exposure to other specified factors, subsequent encounter: Secondary | ICD-10-CM | POA: Insufficient documentation

## 2022-11-07 IMAGING — DX DG ANKLE COMPLETE 3+V*L*
3 series · 3 of 3 positions shown · non-contrast
Comparison: No prior.

CLINICAL DATA: Left ankle swelling and pain.  Wound.

EXAM:
LEFT ANKLE COMPLETE - 3+ VIEW

[ankle ap]
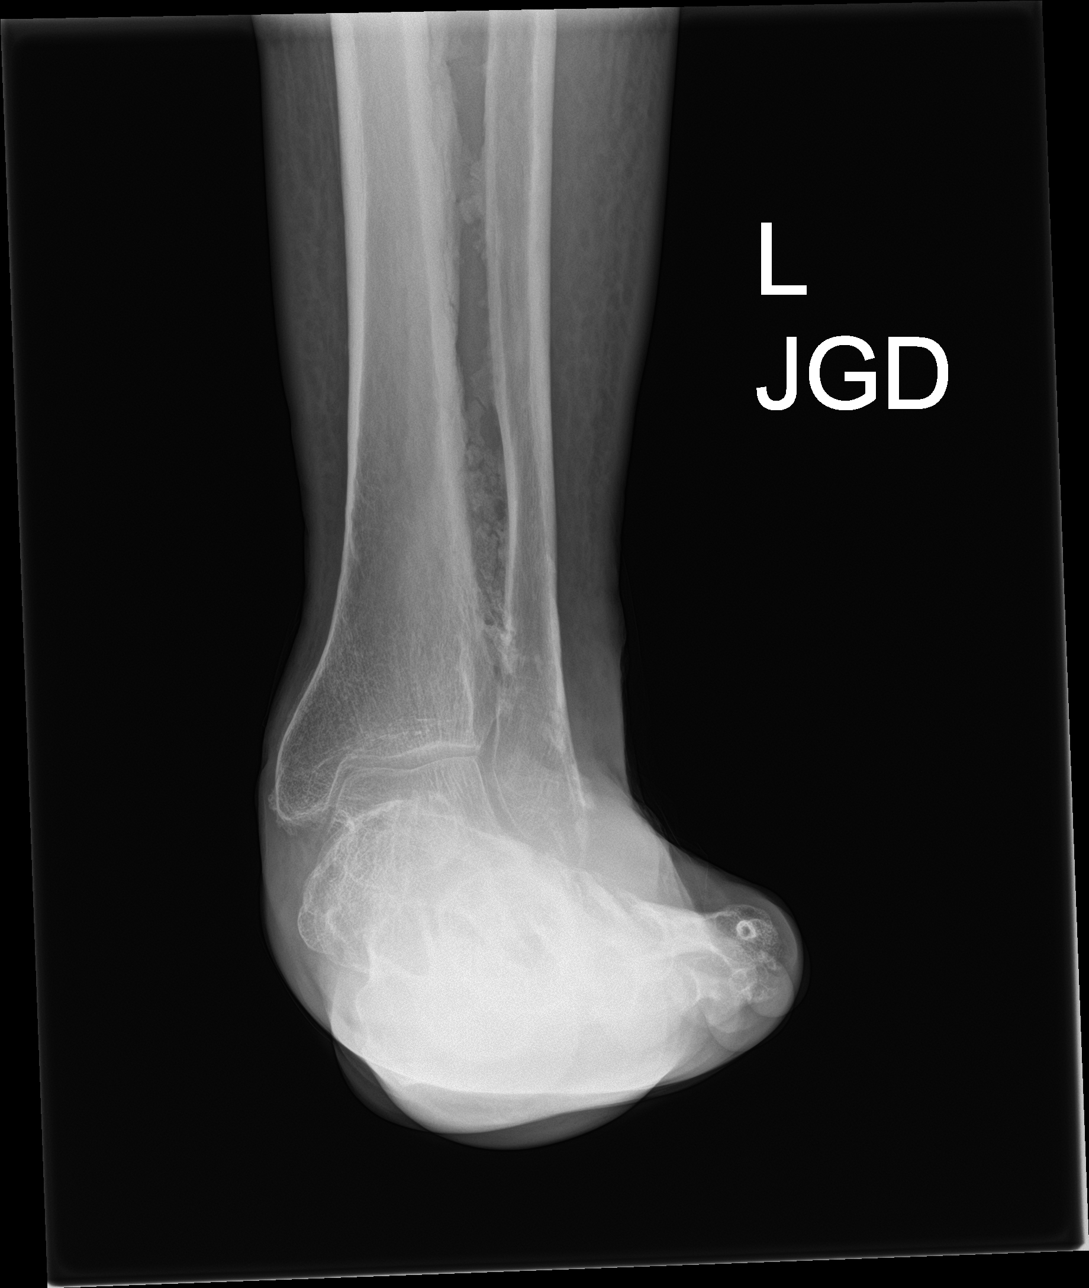

[ankle obl]
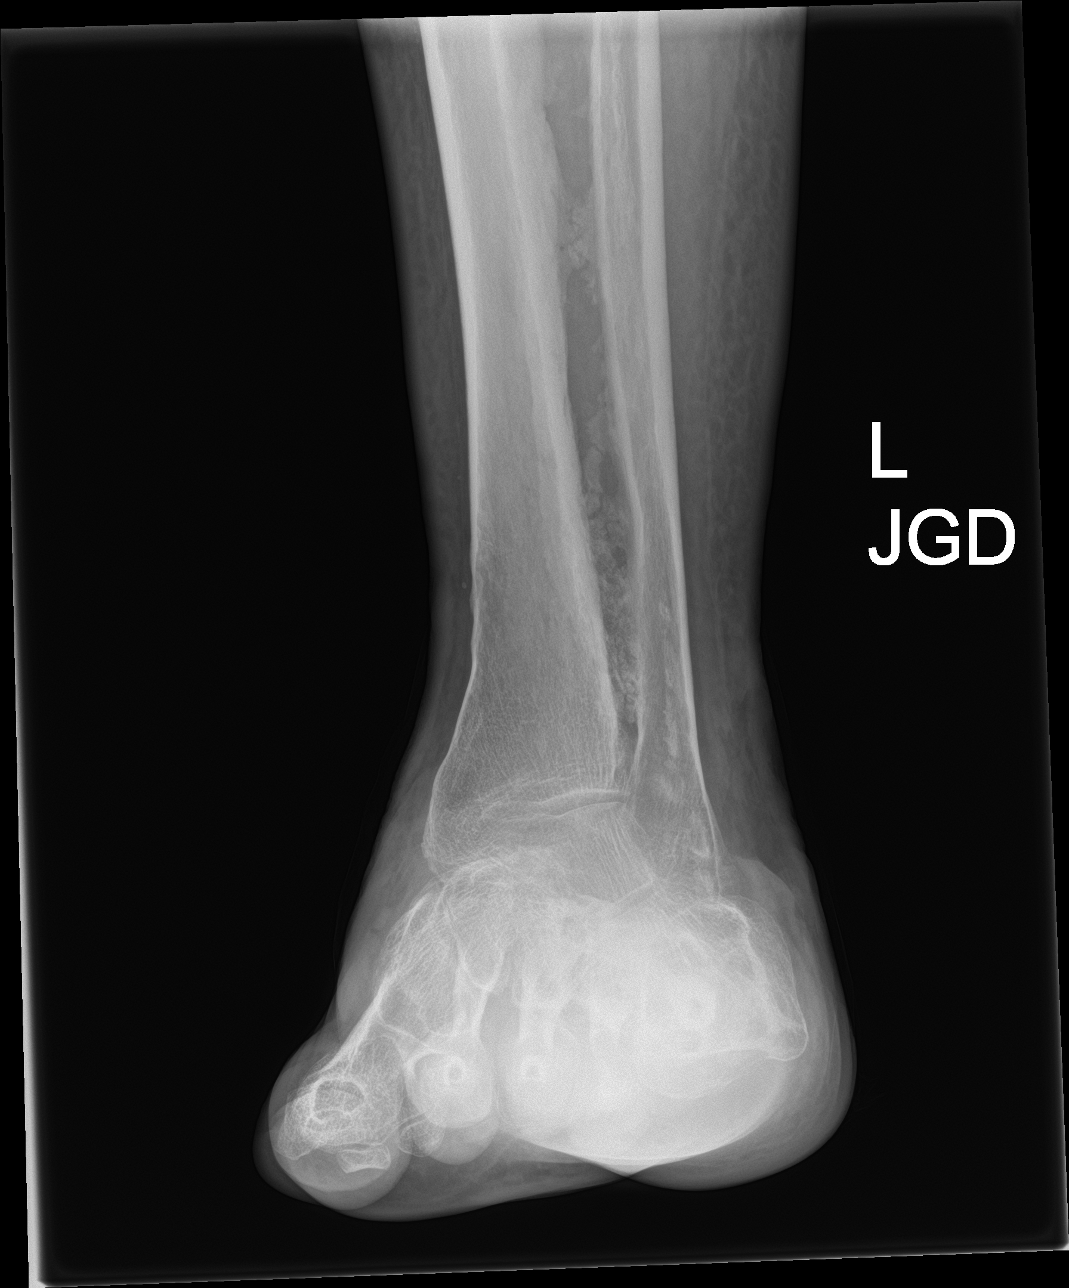

[ankle lat]
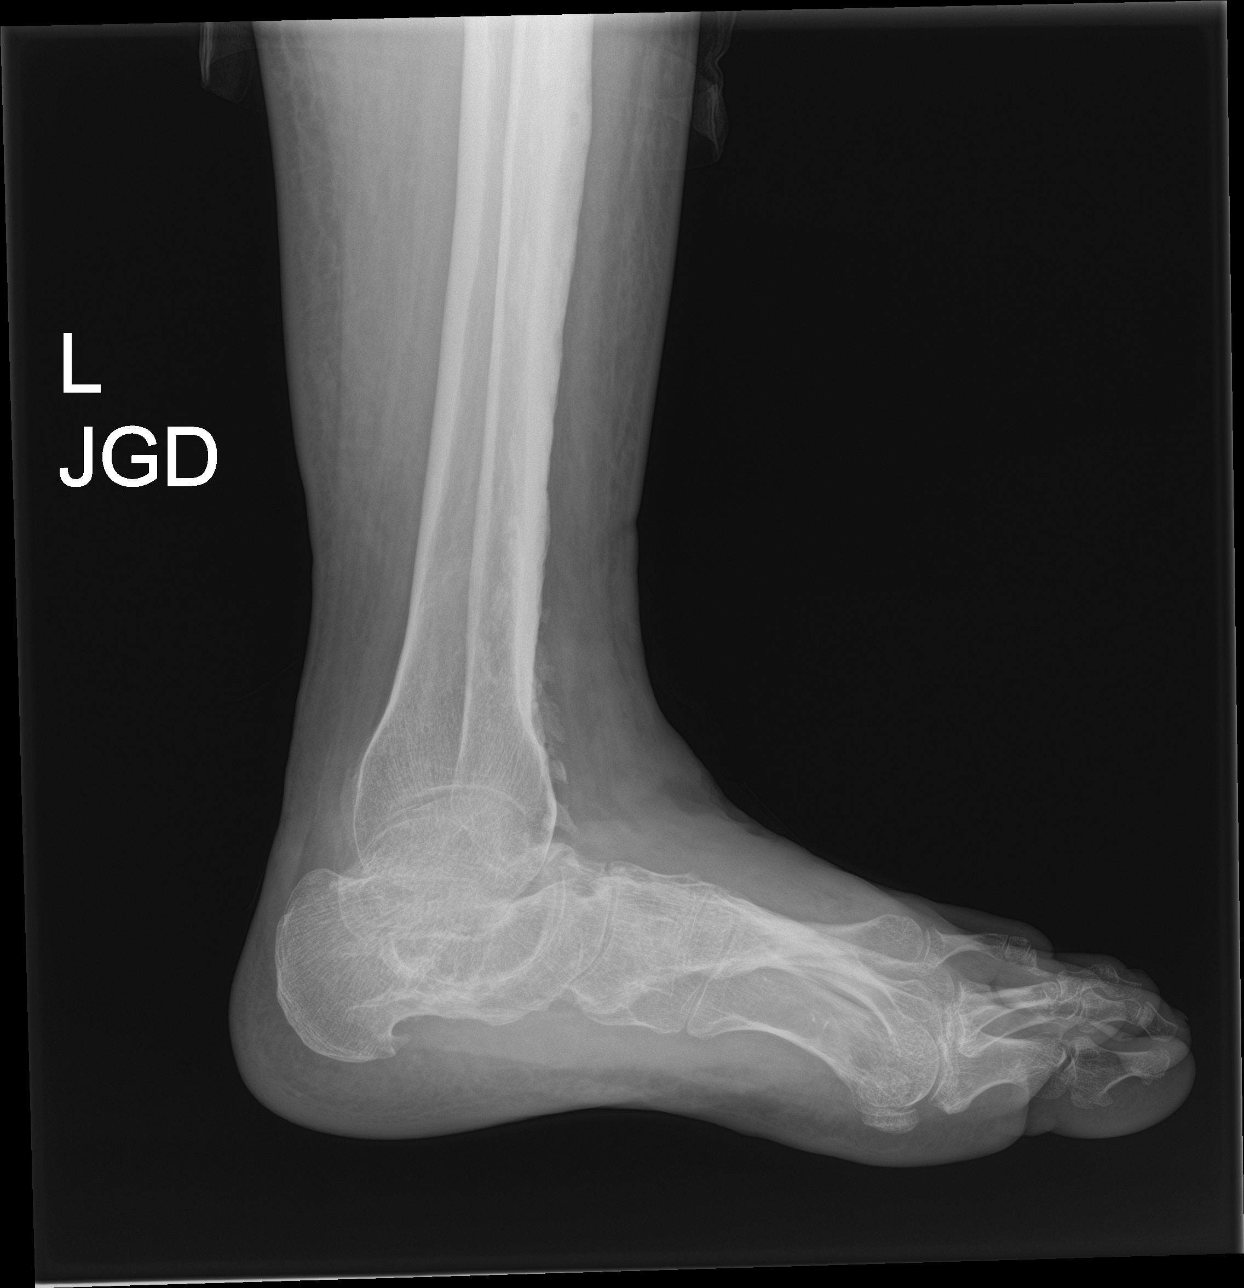

[3 of 3 positions shown; findings below may reference images not displayed]

FINDINGS: Diffuse soft tissue swelling. Diffuse osteopenia degenerative
change. Ossification noted over the high CS number a. no acute bony
abnormality identified. No evidence of fracture.
IMPRESSION: 1. Diffuse osteopenia and degenerative change. No acute abnormality
identified. No acute bony abnormality identified.

2.  Diffuse soft tissue swelling.  No radiopaque foreign body.

## 2022-11-08 NOTE — Progress Notes (Signed)
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 surface, but there is extensive perimeter epithelialization. 02/19/2022: It has been 3 weeks since he came to clinic due to various conflicts. His 3 layer compression wrap remained in situ for that entire period. As [name] result, there has been some tissue breakdown secondary to moisture. The wound is [name] little bit larger but fortunately there has not been [name] tremendous deterioration. There is some slough on the wound surface. No significant drainage or odor. 03/23/2022: It has been [name] month since his last visit. He has had the same 3 layer compression wrap in situ since that time. 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 Remarkably, the wound is [name] little bit smaller today with just [name] layer of slough on  the surface. 03/29/2022: His wound measured slightly larger today. There is slough accumulation on the surface. It also looks as though his footwear is rubbing on his foot and may be also causing some friction at the ankle where his wound is. 04/07/2022: The wound was [name] little bit narrower today. He continues to have slough overlying [name] somewhat fibrotic surface. It appears that he has rectified the situation with his foot wear and I do not see any further evidence of friction trauma. 04/15/2022: No significant change to his wound today. There is still slough on [name] fibrous surface. 04/22/2022: The wound measured slightly smaller today. The surface is much cleaner and has [name] more robust pinkoored color. It is still fairly fibrotic. 05/17/2022: The patient has not been in clinic for nearly 4 weeks. His wrap has remained in place the entire time. The wound measures [name] little bit smaller today. There is slough accumulation. It remains fibrotic. 05/25/2022: The wound surface is improved, with less fibrosis and [name] more pink color. There is slough on the surface with some periwound eschar. 06/01/2022: The wound is [name] little bit smaller again today and the surface continues to improve. Still with slough buildup, but no concern for infection. 06/21/2022: The patient was absent from clinic the past couple of weeks. He returns today and the wound seems to have deteriorated somewhat. Through his interpreter, he reports that at his job, he stands basically immobile for prolonged periods of time and his feet and legs are constantly wet, meaning the wound  is wet throughout his work shifts. He is unable to get [name] waterproof boot over his leg due to the inflexibility of his ankle. 06/29/2022: The wound is about the same size, but it is quite clean and the surface has more of [name] pink color. His employment has told him that they cannot make any further accommodations for him. 07/06/2022: The wound is smaller this week. There is [name]  light layer of slough on the surface, but the drainage on his dressing has the typical blue-green color of Pseudomonas. 12/12; this is [name] patient to be admitted earlier in the year with [name] extensive wound across the left lateral ankle and into the anterior ankle. Initially [name] immigrant from the [place]. He speaks [place] leads and we interviewed him through [name] interpreter. He has been using endoform on the remanent of the wound. Intake nurse tells [place] that we have healed this out but it reopens. He is no longer working 07/21/2022: The wound is smaller today. There is slough on the surface, but good granulation tissue underlying. 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 08/06/2022: The wound is [name] little bit smaller again today. There is thick slough on the surface, but the surface underneath is less fibrotic. 08/31/2022: The patient has missed [name] couple of appointments. T oday, his foot and leg are completely macerated. He says that he has been using [name] plastic bag for showers and that it leaked. The wound is larger, has [name] thick layer of slough on the surface and is malodorous. 09/07/2022: The wound looks better this week. It is smaller with some slough on the surface. [name] small satellite wound has opened up just proximal to the main site. 09/15/2022: The wound has [name] lot more slough on it this week and the satellite wound is larger. He says that he has been standing quite [name] bit at work. 09/22/2022: The satellite lesion is about the same size but much more superficial. The main wound is [name] also about the same size, but has [name] healthier-looking surface. There is slough accumulation on both surfaces. 09/29/2022: The main wound is smaller. The satellite lesion is about the same size. Both have slough on the surface. 10/06/2022: Both wounds are smaller. There is slough and eschar buildup in both sites. 3/13; patient presents for follow-up. We have been using antibiotic ointment with endoform under 3 layer  compression. Patient has no issues or complaints today. 10/21/2022: The wound is smaller today. Both the main wound and satellite lesion are about [name] third smaller than the last time I saw them. There is some slough on both surfaces. 10/29/2022: The satellite lesion has closed. The main wound is smaller again today with just some slough and eschar present. 11/05/2022: The satellite lesion unfortunately reopened, but it is quite small and superficial under some eschar. The main wound continues to contract. There is slough on the surface, but epithelium is creeping down the sides of the divot. Patient History Information obtained from Patient. Family History Unknown History. Social History Current every day smoker, Marital Status - Married, Alcohol Use - Rarely, Drug Use - No History, Caffeine Use - Moderate. Medical [name] Surgical History Notes nd Gastrointestinal Chronic Gastritis Molnar, [name] ([id number]) [id number] Page 10 of 12 Objective Constitutional no acute distress. Vitals Time Taken: 10:15 AM, Height: 69 in, Weight: 170 lbs, BMI: 25.1, Temperature: 98.1 F, Pulse: 69 bpm, Respiratory Rate: 20 breaths/min, Blood Pressure: 130/76 mmHg. Respiratory Normal work of breathing on room air. General Notes: 11/05/2022:  The satellite lesion unfortunately reopened, but it is quite small and superficial under some eschar. The main wound continues to contract. There is slough on the surface, but epithelium is creeping down the sides of the divot. Integumentary (Hair, Skin) Wound #1 status is Open. Original cause of wound was Trauma. The date acquired was: 10/14/2020. The wound has been in treatment 91 weeks. The wound is located on the Left Ankle. The wound measures 0.9cm length x 1.4cm width x 0.2cm depth; 0.99cm^2 area and 0.198cm^3 volume. There is Fat Layer (Subcutaneous Tissue) exposed. There is no tunneling or undermining noted. There is [name] medium amount of  serous drainage noted. The wound margin is flat and intact. There is large (67-100%) red granulation within the wound bed. There is [name] small (1-33%) amount of necrotic tissue within the wound bed. The periwound skin appearance had no abnormalities noted for moisture. The periwound skin appearance had no abnormalities noted for color. The periwound skin appearance exhibited: Scarring. Periwound temperature was noted as No Abnormality. Assessment Active Problems ICD-10 Non-pressure chronic ulcer of left ankle with other specified severity Chronic venous hypertension (idiopathic) with ulcer and inflammation of left lower extremity Procedures Wound #1 Pre-procedure diagnosis of Wound #1 is [name] Venous Leg Ulcer located on the Left Ankle .Severity of Tissue Pre Debridement is: Fat layer exposed. There was [name] Selective/Open Wound Non-Viable Tissue Debridement with [name] total area of 1.26 sq cm performed by [name] Guess, MD. With the following instrument(s): Curette to remove Non-Viable tissue/material. Material removed includes Eschar and Slough and after achieving pain control using Lidocaine 4% T opical Solution. No specimens were taken. [name] time out was conducted at 10:30, prior to the start of the procedure. [name] Minimum amount of bleeding was controlled with Pressure. The procedure was tolerated well with [name] pain level of 0 throughout and [name] pain level of 0 following the procedure. Post Debridement Measurements: 0.9cm length x 1.4cm width x 0.2cm depth; 0.198cm^3 volume. Character of Wound/Ulcer Post Debridement is improved. Severity of Tissue Post Debridement is: Fat layer exposed. Post procedure Diagnosis Wound #1: Same as Pre-Procedure General Notes: Scribed for Dr. Lady [name] by [name] Deed, RN. Pre-procedure diagnosis of Wound #1 is [name] Venous Leg Ulcer located on the Left Ankle . There was [name] Double Layer Compression Therapy Procedure by [name] Deed, RN. Post procedure Diagnosis Wound #1: Same as  Pre-Procedure Notes: urgo lite. Plan Follow-up Appointments: Return Appointment in 1 week. - Dr. Lady [name] - Room 1 Friday 4/12 @ 08:15 am Other: - [place] interpreter required. Anesthetic: (In clinic) Topical Lidocaine 4% applied to wound bed Bathing/ Shower/ Hygiene: Other Bathing/Shower/Hygiene Orders/Instructions: - Please use [name] " [employer]" when showering. This can be purchased from [employer], Medical supply store,[employer]. Cost is approximately $14-27. Edema Control - Lymphedema / SCD / Other: Elevate legs to the level of the heart or above for 30 minutes daily and/or when sitting for 3-4 times [name] day throughout the day. Avoid standing for long periods of time. Exercise regularly Off-Loading: Open toe surgical shoe to: - left foot WOUND #1: - Ankle Wound Laterality: Left Cleanser: Soap and Water 1 x Per Week/ Discharge Instructions: May shower and wash wound with dial antibacterial soap and water prior to dressing change. Cleanser: Vashe 5.8 (oz) 1 x Per Week/ Discharge Instructions: Cleanse the wound with Vashe prior to applying [name] clean dressing using gauze sponges, not tissue or cotton balls. Peri-Wound Care: Sween Lotion (Moisturizing lotion) 1 x Per Week/ Camba, [name] ([id number]) [phone].pdf Page 11  of 12 Discharge Instructions: Apply moisturizing lotion as directed Topical: Gentamicin 1 x Per Week/ Discharge Instructions: As directed by physician Prim Dressing: Endoform 2x2 in 1 x Per Week/ ary Discharge Instructions: Moisten with saline Secondary Dressing: Drawtex 4x4 in 1 x Per Week/ Discharge Instructions: Apply over primary dressing as directed. Secondary Dressing: Woven Gauze Sponge, Non-Sterile 4x4 in 1 x Per Week/ Discharge Instructions: Apply over primary dressing as directed. Com pression Wrap: Urgo K2 Lite, two layer compression system, regular 1 x Per Week/ Discharge Instructions: Apply Urgo K2 Lite as directed (alternative  to 3 layer compression). 11/05/2022: The satellite lesion unfortunately reopened, but it is quite small and superficial under some eschar. The main wound continues to contract. There is slough on the surface, but epithelium is creeping down the sides of the divot. I used [name] curette to debride eschar from the satellite lesion and slough from the main wound. We will continue topical gentamicin with endoform and drawtex packing along with 3 layer compression. Follow-up in 1 week. Electronic Signature(s) Signed: 11/05/2022 11:14:11 AM By: [name] Guess MD FACS Previous Signature: 11/05/2022 11:13:14 AM Version By: [name] Guess MD FACS Entered By: [name] on 11/05/2022 11:14:11 -------------------------------------------------------------------------------- HxROS Details Patient Name: Date of Service: [name], [name] [name] 11/05/2022 10:30 [name] M Medical Record Number: [id number] Patient Account Number: [id number] Date of Birth/Sex: Treating RN: 07/06/1976 (47 y.o. [name] Primary Care Provider: PA [name], [place] Other Clinician: Referring Provider: Treating Provider/Extender: [name] in Treatment: 70 Information Obtained From Patient Gastrointestinal Medical History: Past Medical History Notes: Chronic Gastritis Immunizations Pneumococcal Vaccine: Received Pneumococcal Vaccination: No Implantable Devices No devices added Family and Social History Unknown History: Yes; Current every day smoker; Marital Status - Married; Alcohol Use: Rarely; Drug Use: No History; Caffeine Use: Moderate; Financial Concerns: No; Food, Clothing or Shelter Needs: No; Support System Lacking: No; Transportation Concerns: No Psychologist, prison and probation services) Signed: 11/05/2022 12:19:37 PM By: [name] Guess MD FACS Signed: 11/08/2022 4:22:17 PM By: [name] Deed RN, BSN Entered By: [name] on 11/05/2022  11:10:50 -------------------------------------------------------------------------------- SuperBill Details Patient Name: Date of Service: [name] 11/05/2022 Medical Record Number: [id number] Patient Account Number: [id number] Date of Birth/Sex: Treating RN: March 05, 1976 (47 y.o. [name] Primary Care Provider: PA [name], [place] Other Clinician: Referring Provider: Treating Provider/Extender: [name] in Treatment: [address] [place], [name] ([id number]) [id number] Page 12 of 12 Diagnosis Coding ICD-10 Codes Code Description L97.328 Non-pressure chronic ulcer of left ankle with other specified severity I87.332 Chronic venous hypertension (idiopathic) with ulcer and inflammation of left lower extremity Facility Procedures : CPT4 Code: 30051102 Description: 97597 - DEBRIDE WOUND 1ST 20 SQ CM OR < ICD-10 Diagnosis Description L97.328 Non-pressure chronic ulcer of left ankle with other specified severity Modifier: Quantity: 1 Physician Procedures : CPT4 Code Description Modifier [id number] - WC PHYS LEVEL 4 - EST PT 25 ICD-10 Diagnosis Description L97.328 Non-pressure chronic ulcer of left ankle with other specified severity I87.332 Chronic venous hypertension (idiopathic) with ulcer and  inflammation of left lower extremity Quantity: 1 : [id number] 97597 - WC PHYS DEBR WO ANESTH 20 SQ CM ICD-10 Diagnosis Description L97.328 Non-pressure chronic ulcer of left ankle with other specified severity Quantity: 1 Electronic Signature(s) Signed: 11/05/2022 11:14:25 AM By: [name] Guess MD FACS Entered By: [name] on 11/05/2022 11:14:25

## 2022-11-10 NOTE — Progress Notes (Signed)
Hummel, [name] ([id number]) [id number] Page 1 of 7 Visit Report for 11/05/2022 Arrival Information Details Patient Name: Date of Service: [name] [place] NIE 11/05/2022 10:30 A M Medical Record Number: [id number] Patient Account Number: [id number] Date of Birth/Sex: Treating RN: September 18, 1975 (47 y.o. [name] Primary Care Blia Totman: PA Zenovia Jordan, [place] Other Clinician: Referring Yulitza Shorts: Treating Jden Want/Extender: Horton Marshall in Treatment: 7 Visit Information History Since Last Visit All ordered tests and consults were completed: No Patient Arrived: Ambulatory Added or deleted any medications: No Arrival Time: 10:15 Any new allergies or adverse reactions: No Accompanied By: self Had a fall or experienced change in No Transfer Assistance: None activities of daily living that may affect Patient Identification Verified: Yes risk of falls: Secondary Verification Process Completed: Yes Signs or symptoms of abuse/neglect since last visito No Patient Requires Transmission-Based Precautions: No Hospitalized since last visit: No Patient Has Alerts: No Implantable device outside of the clinic excluding No cellular tissue based products placed in the center since last visit: Has Dressing in Place as Prescribed: Yes Has Compression in Place as Prescribed: Yes Pain Present Now: No Electronic Signature(s) Signed: 11/08/2022 4:22:17 PM By: [name], BSN Entered By: Zenaida Deed on 11/05/2022 10:19:28 -------------------------------------------------------------------------------- Compression Therapy Details Patient Name: Date of Service: [name] 11/05/2022 10:30 A M Medical Record Number: [id number] Patient Account Number: [id number] Date of Birth/Sex: Treating RN: 1975/11/26 (47 y.o. [name] Primary Care Mirelle Biskup: PA Zenovia Jordan, [place] Other Clinician: Referring Johnelle Tafolla: Treating Gamal Todisco/Extender:  Horton Marshall in Treatment: 58 Compression Therapy Performed for Wound Assessment: Wound #1 Left Ankle Performed By: Clinician Zenaida Deed, RN Compression Type: Double Layer Post Procedure Diagnosis Same as Pre-procedure Notes urgo lite Electronic Signature(s) Signed: 11/08/2022 4:22:17 PM By: [name], BSN Entered By: Zenaida Deed on 11/05/2022 10:34:46 -------------------------------------------------------------------------------- Encounter Discharge Information Details Patient Name: Date of Service: [name] 11/05/2022 10:30 A M Medical Record Number: [id number] Patient Account Number: [id number] Date of Birth/Sex: Treating RN: Jan 11, 1976 (47 y.o. [name] Primary Care Trust Crago: PA Zenovia Jordan, [place] Other Clinician: Referring Reice Bienvenue: Treating Anglia Blakley/Extender: Horton Marshall in Treatment: 67 Encounter Discharge Information Items Post Procedure Vitals Duquette, [name] ([id number]) [id number] Page 2 of 7 Discharge Condition: Stable Temperature (F): 98.1 Ambulatory Status: Ambulatory Pulse (bpm): 69 Discharge Destination: Home Respiratory Rate (breaths/min): 18 Transportation: Private Auto Blood Pressure (mmHg): 130/76 Accompanied By: interpreter Schedule Follow-up Appointment: Yes Clinical Summary of Care: Patient Declined Electronic Signature(s) Signed: 11/08/2022 4:22:17 PM By: [name], BSN Entered By: Zenaida Deed on 11/05/2022 10:58:22 -------------------------------------------------------------------------------- Lower Extremity Assessment Details Patient Name: Date of Service: [name] 11/05/2022 10:30 A M Medical Record Number: [id number] Patient Account Number: [id number] Date of Birth/Sex: Treating RN: 05-22-1976 (47 y.o. [name] Primary Care Moyses Pavey: PA Zenovia Jordan, [place] Other Clinician: Referring Carrye Goller: Treating  Ludmilla Mcgillis/Extender: Horton Marshall in Treatment: 91 Edema Assessment Assessed: [Left: No] [Right: No] Edema: [Left: Ye] [Right: s] Calf Left: Right: Point of Measurement: 28 cm From Medial Instep 30 cm Ankle Left: Right: Point of Measurement: 8 cm From Medial Instep 21.2 cm Vascular Assessment Pulses: Dorsalis Pedis Palpable: [Left:Yes] Electronic Signature(s) Signed: 11/08/2022 4:22:17 PM By: [name], BSN Entered By: Zenaida Deed on 11/05/2022 10:20:35 -------------------------------------------------------------------------------- Multi Wound Chart Details Patient Name: Date of Service: [name] 11/05/2022 10:30 A M Medical Record Number: [id number] Patient Account Number: [id number] Date of Birth/Sex: Treating  RN: October 03, 1975 (47 y.o. [name] Primary Care Abdimalik Mayorquin: PA Zenovia Jordan, [place] Other Clinician: Referring Suzette Flagler: Treating Vinaya Sancho/Extender: Horton Marshall in Treatment: 91 Vital Signs Height(in): 69 Pulse(bpm): 69 Weight(lbs): 170 Blood Pressure(mmHg): 130/76 Body Mass Index(BMI): 25.1 Temperature(F): 98.1 Respiratory Rate(breaths/min): 20 [1:Photos:] [N/A:N/A] Left Ankle N/A N/A Wound Location: Trauma N/A N/A Wounding Event: Venous Leg Ulcer N/A N/A Primary Etiology: 10/14/2020 N/A N/A Date Acquired: 28 N/A N/A Weeks of Treatment: Open N/A N/A Wound Status: No N/A N/A Wound Recurrence: 0.9x1.4x0.2 N/A N/A Measurements L x W x D (cm) 0.99 N/A N/A A (cm) : rea 0.198 N/A N/A Volume (cm) : 98.40% N/A N/A % Reduction in A rea: 99.20% N/A N/A % Reduction in Volume: Full Thickness Without Exposed N/A N/A Classification: Support Structures Medium N/A N/A Exudate A mount: Serous N/A N/A Exudate Type: amber N/A N/A Exudate Color: Flat and Intact N/A N/A Wound Margin: Large (67-100%) N/A N/A Granulation A mount: Red N/A N/A Granulation Quality: Small (1-33%) N/A  N/A Necrotic A mount: Fat Layer (Subcutaneous Tissue): Yes N/A N/A Exposed Structures: Fascia: No Tendon: No Muscle: No Joint: No Bone: No Small (1-33%) N/A N/A Epithelialization: Debridement - Selective/Open Wound N/A N/A Debridement: Pre-procedure Verification/Time Out 10:30 N/A N/A Taken: Lidocaine 4% Topical Solution N/A N/A Pain Control: Necrotic/Eschar, Slough N/A N/A Tissue Debrided: Non-Viable Tissue N/A N/A Level: 1.26 N/A N/A Debridement A (sq cm): rea Curette N/A N/A Instrument: Minimum N/A N/A Bleeding: Pressure N/A N/A Hemostasis A chieved: 0 N/A N/A Procedural Pain: 0 N/A N/A Post Procedural Pain: Procedure was tolerated well N/A N/A Debridement Treatment Response: 0.9x1.4x0.2 N/A N/A Post Debridement Measurements L x W x D (cm) 0.198 N/A N/A Post Debridement Volume: (cm) Scarring: Yes N/A N/A Periwound Skin Texture: Maceration: No N/A N/A Periwound Skin Moisture: Dry/Scaly: No No Abnormalities Noted N/A N/A Periwound Skin Color: No Abnormality N/A N/A Temperature: Compression Therapy N/A N/A Procedures Performed: Debridement Treatment Notes Wound #1 (Ankle) Wound Laterality: Left Cleanser Soap and Water Discharge Instruction: May shower and wash wound with dial antibacterial soap and water prior to dressing change. Vashe 5.8 (oz) Discharge Instruction: Cleanse the wound with Vashe prior to applying a clean dressing using gauze sponges, not tissue or cotton balls. Peri-Wound Care Sween Lotion (Moisturizing lotion) Discharge Instruction: Apply moisturizing lotion as directed Topical Gentamicin Discharge Instruction: As directed by physician Primary Dressing Endoform 2x2 in Discharge Instruction: Moisten with saline Secondary Dressing Coby, [name] ([id number]) [phone].pdf Page 4 of 7 Drawtex 4x4 in Discharge Instruction: Apply over primary dressing as directed. Woven Gauze Sponge, Non-Sterile 4x4  in Discharge Instruction: Apply over primary dressing as directed. Secured With Compression Wrap Urgo K2 Lite, two layer compression system, regular Discharge Instruction: Apply Urgo K2 Lite as directed (alternative to 3 layer compression). Compression Stockings Add-Ons Electronic Signature(s) Signed: 11/05/2022 11:10:01 AM By: [name] FACS Signed: 11/08/2022 4:22:17 PM By: [name], BSN Entered By: Duanne Guess on 11/05/2022 11:10:01 -------------------------------------------------------------------------------- Multi-Disciplinary Care Plan Details Patient Name: Date of Service: [name] 11/05/2022 10:30 A M Medical Record Number: [id number] Patient Account Number: [id number] Date of Birth/Sex: Treating RN: June 18, 1976 (47 y.o. [name] Primary Care Koty Anctil: PA Zenovia Jordan, [place] Other Clinician: Referring Daimen Shovlin: Treating Briena Swingler/Extender: Horton Marshall in Treatment: 31 Multidisciplinary Care Plan reviewed with physician Active Inactive Venous Leg Ulcer Nursing Diagnoses: Actual venous Insuffiency (use after diagnosis is confirmed) Knowledge deficit related to disease process and management Goals: Patient will maintain optimal edema  control Date Initiated: 02/19/2022 Target Resolution Date: 11/26/2022 Goal Status: Active Interventions: Assess peripheral edema status every visit. Compression as ordered Treatment Activities: Therapeutic compression applied : 02/19/2022 Notes: Wound/Skin Impairment Nursing Diagnoses: Knowledge deficit related to ulceration/compromised skin integrity Goals: Patient/caregiver will verbalize understanding of skin care regimen Date Initiated: 02/05/2021 Target Resolution Date: 11/26/2022 Goal Status: Active Interventions: Assess patient/caregiver ability to obtain necessary supplies Assess patient/caregiver ability to perform ulcer/skin care regimen upon admission and as  needed Provide education on ulcer and skin care Treatment Activities: Skin care regimen initiated : 02/05/2021 Koltz, Randall AnANANIE ([id number]) [id number] Page 5 of 7 Topical wound management initiated : 02/05/2021 Notes: 03/31/21: Wound care regimen ongoing, target date extended. 04/21/21: Wound care ongoing, through interpreter patient states he is doing fine with his dressing changes. Electronic Signature(s) Signed: 11/08/2022 4:22:17 PM By: Zenaida DeedBoehlein, Linda RN, BSN Entered By: Zenaida DeedBoehlein, Linda on 11/05/2022 10:21:52 -------------------------------------------------------------------------------- Pain Assessment Details Patient Name: Date of Service: [name] [name] MarseillesYISHIMYE, [name] 11/05/2022 10:30 A M Medical Record Number: [id number] Patient Account Number: [id number] Date of Birth/Sex: Treating RN: 04/17/1976 (47 y.o. [name]) Boehlein, Linda Primary Care Delaila Nand: [place] Zenovia [place], [place] Other Clinician: Referring Tanyiah Laurich: Treating Takina Busser/Extender: Horton Marshallannon, Jennifer Dahbura, [name] Weeks in Treatment: 6591 Active Problems Location of Pain Severity and Description of Pain Patient Has Paino No Site Locations Rate the pain. Current Pain Level: 0 Pain Management and Medication Current Pain Management: Electronic Signature(s) Signed: 11/08/2022 4:22:17 PM By: Zenaida DeedBoehlein, Linda RN, BSN Entered By: Zenaida DeedBoehlein, Linda on 11/05/2022 10:19:43 -------------------------------------------------------------------------------- Patient/Caregiver Education Details Patient Name: Date of Service: [name] [name] MarseillesYISHIMYE, [name] 4/5/2024andnbsp10:30 A M Medical Record Number: [id number] Patient Account Number: [id number] Date of Birth/Gender: Treating RN: 01/09/1976 ([age] y.o. [name]) Boehlein, Linda Primary Care Physician: PA Zenovia [place], [place] Other Clinician: Referring Physician: Treating Physician/Extender: Horton Marshallannon, Jennifer Dahbura, [name] Weeks in Treatment: 7991 Education Assessment Education Provided To: Patient Education  Topics Provided Venous: Bisceglia, Randall AnANANIE ([id number]) [id number] Page 6 of 7 Methods: Explain/Verbal Responses: Reinforcements needed, State content correctly Wound/Skin Impairment: Methods: Explain/Verbal Responses: Reinforcements needed, State content correctly Electronic Signature(s) Signed: 11/08/2022 4:22:17 PM By: Zenaida DeedBoehlein, Linda RN, BSN Entered By: Zenaida DeedBoehlein, Linda on 11/05/2022 10:25:00 -------------------------------------------------------------------------------- Wound Assessment Details Patient Name: Date of Service: [name] [name] MarseillesYISHIMYE, [name] 11/05/2022 10:30 A M Medical Record Number: [id number] Patient Account Number: [id number] Date of Birth/Sex: Treating RN: 11/17/1975 (47 y.o. [name]) Boehlein, Linda Primary Care Jenica Costilow: PA Zenovia [place]IENT, [place] Other Clinician: Referring Rufino Staup: Treating Nygeria Lager/Extender: Horton Marshallannon, Jennifer Dahbura, [name] Weeks in Treatment: 91 Wound Status Wound Number: 1 Primary Etiology: Venous Leg Ulcer Wound Location: Left Ankle Wound Status: Open Wounding Event: Trauma Date Acquired: 10/14/2020 Weeks Of Treatment: 91 Clustered Wound: No Photos Wound Measurements Length: (cm) 0.9 Width: (cm) 1.4 Depth: (cm) 0.2 Area: (cm) 0.99 Volume: (cm) 0.198 % Reduction in Area: 98.4% % Reduction in Volume: 99.2% Epithelialization: Small (1-33%) Tunneling: No Undermining: No Wound Description Classification: Full Thickness Without Exposed Suppor Wound Margin: Flat and Intact Exudate Amount: Medium Exudate Type: Serous Exudate Color: amber t Structures Foul Odor After Cleansing: No Slough/Fibrino Yes Wound Bed Granulation Amount: Large (67-100%) Exposed Structure Granulation Quality: Red Fascia Exposed: No Necrotic Amount: Small (1-33%) Fat Layer (Subcutaneous Tissue) Exposed: Yes Tendon Exposed: No Muscle Exposed: No Joint Exposed: No Bone Exposed: No Periwound Skin Texture Texture Color No Abnormalities Noted: No No  Abnormalities Noted: Yes Scarring: Yes Fredin, Randall AnANANIE ([id number]) [id number] Page 7 of 7 Scarring: Yes Temperature / Pain Temperature: No Abnormality Moisture No Abnormalities Noted: Yes Treatment Notes Wound #1 (Ankle)  Wound Laterality: Left Cleanser Soap and Water Discharge Instruction: May shower and wash wound with dial antibacterial soap and water prior to dressing change. Vashe 5.8 (oz) Discharge Instruction: Cleanse the wound with Vashe prior to applying a clean dressing using gauze sponges, not tissue or cotton balls. Peri-Wound Care Sween Lotion (Moisturizing lotion) Discharge Instruction: Apply moisturizing lotion as directed Topical Gentamicin Discharge Instruction: As directed by physician Primary Dressing Endoform 2x2 in Discharge Instruction: Moisten with saline Secondary Dressing Drawtex 4x4 in Discharge Instruction: Apply over primary dressing as directed. Woven Gauze Sponge, Non-Sterile 4x4 in Discharge Instruction: Apply over primary dressing as directed. Secured With Compression Wrap Urgo K2 Lite, two layer compression system, regular Discharge Instruction: Apply Urgo K2 Lite as directed (alternative to 3 layer compression). Compression Stockings Add-Ons Electronic Signature(s) Signed: 11/08/2022 4:22:17 PM By: [name], BSN Signed: 11/10/2022 10:32:09 AM By: Dayton Scrape Entered By: Dayton Scrape on 11/05/2022 10:24:11 -------------------------------------------------------------------------------- Vitals Details Patient Name: Date of Service: [name], [name] 11/05/2022 10:30 A M Medical Record Number: [id number] Patient Account Number: [id number] Date of Birth/Sex: Treating RN: 07-07-1976 (47 y.o. [name] Primary Care Kathaleen Dudziak: PA Zenovia Jordan, [place] Other Clinician: Referring Jermeka Schlotterbeck: Treating Callaghan Laverdure/Extender: Horton Marshall in Treatment: 91 Vital Signs Time Taken:  10:15 Temperature (F): 98.1 Height (in): 69 Pulse (bpm): 69 Weight (lbs): 170 Respiratory Rate (breaths/min): 20 Body Mass Index (BMI): 25.1 Blood Pressure (mmHg): 130/76 Reference Range: 80 - 120 mg / dl Electronic Signature(s) Signed: 11/10/2022 10:32:09 AM By: Dayton Scrape Entered By: Dayton Scrape on 11/05/2022 10:15:48

## 2022-11-12 ENCOUNTER — Encounter (HOSPITAL_BASED_OUTPATIENT_CLINIC_OR_DEPARTMENT_OTHER): Payer: Medicaid Other | Admitting: General Surgery

## 2022-11-12 DIAGNOSIS — L97322 Non-pressure chronic ulcer of left ankle with fat layer exposed: Secondary | ICD-10-CM | POA: Diagnosis not present

## 2022-11-12 DIAGNOSIS — L97328 Non-pressure chronic ulcer of left ankle with other specified severity: Secondary | ICD-10-CM | POA: Diagnosis not present

## 2022-11-12 DIAGNOSIS — I87332 Chronic venous hypertension (idiopathic) with ulcer and inflammation of left lower extremity: Secondary | ICD-10-CM | POA: Diagnosis not present

## 2022-11-12 DIAGNOSIS — M85872 Other specified disorders of bone density and structure, left ankle and foot: Secondary | ICD-10-CM | POA: Diagnosis not present

## 2022-11-12 DIAGNOSIS — T708XXD Other effects of air pressure and water pressure, subsequent encounter: Secondary | ICD-10-CM | POA: Diagnosis not present

## 2022-11-12 DIAGNOSIS — F17200 Nicotine dependence, unspecified, uncomplicated: Secondary | ICD-10-CM | POA: Diagnosis not present

## 2022-11-12 DIAGNOSIS — L97529 Non-pressure chronic ulcer of other part of left foot with unspecified severity: Secondary | ICD-10-CM | POA: Diagnosis not present

## 2022-11-12 DIAGNOSIS — I872 Venous insufficiency (chronic) (peripheral): Secondary | ICD-10-CM | POA: Diagnosis not present

## 2022-11-12 NOTE — Progress Notes (Signed)
Scrivener, [name] ([id number]) [id number] Page 1 of 7 Visit Report for 11/12/2022 Arrival Information Details Patient Name: Date of Service: [name] [place] NIE 11/12/2022 8:15 A M Medical Record Number: [id number] Patient Account Number: [id number] Date of Birth/Sex: Treating RN: 13-Jan-1976 (47 y.o. M) Primary Care Nayleah Gamel: PA Zenovia Jordan, [place] Other Clinician: Referring Maher Shon: Treating Edison Nicholson/Extender: Horton Marshall in Treatment: 92 Visit Information History Since Last Visit All ordered tests and consults were completed: No Patient Arrived: Ambulatory Added or deleted any medications: No Arrival Time: 08:19 Any new allergies or adverse reactions: No Accompanied By: self Had a fall or experienced change in No Transfer Assistance: None activities of daily living that may affect Patient Identification Verified: Yes risk of falls: Secondary Verification Process Completed: Yes Signs or symptoms of abuse/neglect since last visito No Patient Requires Transmission-Based Precautions: No Hospitalized since last visit: No Patient Has Alerts: No Implantable device outside of the clinic excluding No cellular tissue based products placed in the center since last visit: Has Dressing in Place as Prescribed: Yes Has Compression in Place as Prescribed: Yes Pain Present Now: No Electronic Signature(s) Signed: 11/12/2022 4:42:28 PM By: [name], BSN Entered By: Zenaida Deed on 11/12/2022 08:31:14 -------------------------------------------------------------------------------- Compression Therapy Details Patient Name: Date of Service: [name] 11/12/2022 8:15 A M Medical Record Number: [id number] Patient Account Number: [id number] Date of Birth/Sex: Treating RN: 1975/10/08 (47 y.o. [name] Primary Care Decarla Siemen: PA Zenovia Jordan, [place] Other Clinician: Referring Oliviagrace Crisanti: Treating Mattie Nordell/Extender: Horton Marshall in Treatment: 92 Compression Therapy Performed for Wound Assessment: Wound #1 Left Ankle Performed By: Clinician Zenaida Deed, RN Compression Type: Double Layer Post Procedure Diagnosis Same as Pre-procedure Notes urgo lite Electronic Signature(s) Signed: 11/12/2022 4:42:28 PM By: [name], BSN Entered By: Zenaida Deed on 11/12/2022 09:04:16 -------------------------------------------------------------------------------- Encounter Discharge Information Details Patient Name: Date of Service: [name] 11/12/2022 8:15 A M Medical Record Number: [id number] Patient Account Number: [id number] Date of Birth/Sex: Treating RN: 1975/08/19 (47 y.o. [name] Primary Care Delailah Spieth: PA Zenovia Jordan, [place] Other Clinician: Referring Carliss Porcaro: Treating Arek Spadafore/Extender: Horton Marshall in Treatment: 98 Encounter Discharge Information Items Post Procedure Vitals Morin, [name] ([id number]) [id number] Page 2 of 7 Discharge Condition: Stable Temperature (F): 97.6 Ambulatory Status: Ambulatory Pulse (bpm): 66 Discharge Destination: Home Respiratory Rate (breaths/min): 18 Transportation: Private Auto Blood Pressure (mmHg): 132/85 Accompanied By: interpreter Schedule Follow-up Appointment: Yes Clinical Summary of Care: Patient Declined Electronic Signature(s) Signed: 11/12/2022 4:42:28 PM By: [name], BSN Entered By: Zenaida Deed on 11/12/2022 09:20:42 -------------------------------------------------------------------------------- Lower Extremity Assessment Details Patient Name: Date of Service: [name] 11/12/2022 8:15 A M Medical Record Number: [id number] Patient Account Number: [id number] Date of Birth/Sex: Treating RN: January 31, 1976 (47 y.o. [name] Primary Care Redell Nazir: PA Zenovia Jordan, [place] Other Clinician: Referring Lorynn Moeser: Treating  Dai Mcadams/Extender: Horton Marshall in Treatment: 92 Edema Assessment Assessed: [Left: No] [Right: No] Edema: [Left: Ye] [Right: s] Calf Left: Right: Point of Measurement: 28 cm From Medial Instep 30.5 cm Ankle Left: Right: Point of Measurement: 8 cm From Medial Instep 21 cm Vascular Assessment Pulses: Dorsalis Pedis Palpable: [Left:No] Electronic Signature(s) Signed: 11/12/2022 4:42:28 PM By: [name], BSN Entered By: Zenaida Deed on 11/12/2022 08:32:24 -------------------------------------------------------------------------------- Multi Wound Chart Details Patient Name: Date of Service: [name] 11/12/2022 8:15 A M Medical Record Number: [id number] Patient Account Number: [id number] Date of Birth/Sex: Treating RN: 1976/02/15 (  47 y.o. M) Primary Care Aniayah Alaniz: PA Zenovia Jordan, [place] Other Clinician: Referring Deshay Blumenfeld: Treating Syrah Daughtrey/Extender: Horton Marshall in Treatment: 92 Vital Signs Height(in): 69 Pulse(bpm): 66 Weight(lbs): 170 Blood Pressure(mmHg): 132/85 Body Mass Index(BMI): 25.1 Temperature(F): 97.6 Respiratory Rate(breaths/min): 20 [1:Photos:] [N/A:N/A] Left Ankle N/A N/A Wound Location: Trauma N/A N/A Wounding Event: Venous Leg Ulcer N/A N/A Primary Etiology: 10/14/2020 N/A N/A Date Acquired: 27 N/A N/A Weeks of Treatment: Open N/A N/A Wound Status: No N/A N/A Wound Recurrence: 0.8x1.4x0.2 N/A N/A Measurements L x W x D (cm) 0.88 N/A N/A A (cm) : rea 0.176 N/A N/A Volume (cm) : 98.60% N/A N/A % Reduction in A rea: 99.30% N/A N/A % Reduction in Volume: Full Thickness Without Exposed N/A N/A Classification: Support Structures Medium N/A N/A Exudate A mount: Serous N/A N/A Exudate Type: amber N/A N/A Exudate Color: Flat and Intact N/A N/A Wound Margin: Large (67-100%) N/A N/A Granulation A mount: Red N/A N/A Granulation Quality: Small (1-33%) N/A N/A Necrotic A  mount: Fat Layer (Subcutaneous Tissue): Yes N/A N/A Exposed Structures: Fascia: No Tendon: No Muscle: No Joint: No Bone: No Small (1-33%) N/A N/A Epithelialization: Debridement - Selective/Open Wound N/A N/A Debridement: Pre-procedure Verification/Time Out 09:00 N/A N/A Taken: Lidocaine 4% Topical Solution N/A N/A Pain Control: Necrotic/Eschar, Slough N/A N/A Tissue Debrided: Non-Viable Tissue N/A N/A Level: 1.12 N/A N/A Debridement A (sq cm): rea Curette N/A N/A Instrument: Minimum N/A N/A Bleeding: Pressure N/A N/A Hemostasis A chieved: 0 N/A N/A Procedural Pain: 0 N/A N/A Post Procedural Pain: Procedure was tolerated well N/A N/A Debridement Treatment Response: 0.8x1.4x0.2 N/A N/A Post Debridement Measurements L x W x D (cm) 0.176 N/A N/A Post Debridement Volume: (cm) Scarring: Yes N/A N/A Periwound Skin Texture: Maceration: No N/A N/A Periwound Skin Moisture: Dry/Scaly: No No Abnormalities Noted N/A N/A Periwound Skin Color: No Abnormality N/A N/A Temperature: Compression Therapy N/A N/A Procedures Performed: Debridement Treatment Notes Electronic Signature(s) Signed: 11/12/2022 9:17:24 AM By: [name] FACS Entered By: Duanne Guess on 11/12/2022 09:17:24 -------------------------------------------------------------------------------- Multi-Disciplinary Care Plan Details Patient Name: Date of Service: [name] 11/12/2022 8:15 A M Medical Record Number: [id number] Patient Account Number: [id number] Date of Birth/Sex: Treating RN: 1975-10-30 (47 y.o. [name] Primary Care Cecillia Menees: PA Zenovia Jordan, [place] Other Clinician: Referring Dimitri Dsouza: Treating Mcgwire Dasaro/Extender: Horton Marshall in Treatment: 102 Multidisciplinary Care Plan reviewed with physician Active Inactive Lubitz, [name] ([id number]) [id number] Page 4 of 7 Venous Leg Ulcer Nursing Diagnoses: Actual venous  Insuffiency (use after diagnosis is confirmed) Knowledge deficit related to disease process and management Goals: Patient will maintain optimal edema control Date Initiated: 02/19/2022 Target Resolution Date: 11/26/2022 Goal Status: Active Interventions: Assess peripheral edema status every visit. Compression as ordered Treatment Activities: Therapeutic compression applied : 02/19/2022 Notes: Wound/Skin Impairment Nursing Diagnoses: Knowledge deficit related to ulceration/compromised skin integrity Goals: Patient/caregiver will verbalize understanding of skin care regimen Date Initiated: 02/05/2021 Target Resolution Date: 11/26/2022 Goal Status: Active Interventions: Assess patient/caregiver ability to obtain necessary supplies Assess patient/caregiver ability to perform ulcer/skin care regimen upon admission and as needed Provide education on ulcer and skin care Treatment Activities: Skin care regimen initiated : 02/05/2021 Topical wound management initiated : 02/05/2021 Notes: 03/31/21: Wound care regimen ongoing, target date extended. 04/21/21: Wound care ongoing, through interpreter patient states he is doing fine with his dressing changes. Electronic Signature(s) Signed: 11/12/2022 4:42:28 PM By: [name], BSN Entered By: Zenaida Deed on 11/12/2022 08:37:06 -------------------------------------------------------------------------------- Pain Assessment Details Patient Name:  Date of Service: [name] 11/12/2022 8:15 A M Medical Record Number: [id number] Patient Account Number: [id number] Date of Birth/Sex: Treating RN: 11/03/1975 (47 y.o. M) Primary Care Ardra Kuznicki: PA Fidela [place] Other Clinician: Referring Brenson Hartman: Treating Jonny Longino/Extender: Horton Marshall in Treatment: 92 Active Problems Location of Pain Severity and Description of Pain Patient Has Paino No Site Locations Belko, [name] ([id number])  [id number] Page 5 of 7 Pain Management and Medication Current Pain Management: Electronic Signature(s) Signed: 11/12/2022 11:09:52 AM By: Dayton Scrape Entered By: Dayton Scrape on 11/12/2022 08:20:11 -------------------------------------------------------------------------------- Patient/Caregiver Education Details Patient Name: Date of Service: [name] 4/12/2024andnbsp8:15 A M Medical Record Number: [id number] Patient Account Number: [id number] Date of Birth/Gender: Treating RN: 1976/01/14 (47 y.o. [name] Primary Care Physician: PA Zenovia Jordan, [place] Other Clinician: Referring Physician: Treating Physician/Extender: Horton Marshall in Treatment: 62 Education Assessment Education Provided To: Patient Education Topics Provided Venous: Methods: Explain/Verbal Responses: Reinforcements needed, State content correctly Wound/Skin Impairment: Methods: Explain/Verbal Responses: Reinforcements needed, State content correctly Electronic Signature(s) Signed: 11/12/2022 4:42:28 PM By: [name], BSN Entered By: Zenaida Deed on 11/12/2022 08:37:26 -------------------------------------------------------------------------------- Wound Assessment Details Patient Name: Date of Service: [name] 11/12/2022 8:15 A M Medical Record Number: [id number] Patient Account Number: [id number] Date of Birth/Sex: Treating RN: 09/02/1975 (47 y.o. M) Primary Care Kahle Mcqueen: PA Fidela [place] Other Clinician: Referring Khrystyne Arpin: Treating Godric Lavell/Extender: Horton Marshall in Treatment: 92 Wound Status Wound Number: 1 Primary Etiology: Venous Leg Ulcer Wound Location: Left Ankle Wound Status: Open [place]ow, [name] ([id number]) [id number] Page 6 of 7 Wounding Event: Trauma Date Acquired: 10/14/2020 Weeks Of Treatment: 92 Clustered Wound: No Photos Wound  Measurements Length: (cm) 0.8 Width: (cm) 1.4 Depth: (cm) 0.2 Area: (cm) 0.88 Volume: (cm) 0.176 % Reduction in Area: 98.6% % Reduction in Volume: 99.3% Epithelialization: Small (1-33%) Tunneling: No Undermining: No Wound Description Classification: Full Thickness Without Exposed Support Structures Wound Margin: Flat and Intact Exudate Amount: Medium Exudate Type: Serous Exudate Color: amber Foul Odor After Cleansing: No Slough/Fibrino Yes Wound Bed Granulation Amount: Large (67-100%) Exposed Structure Granulation Quality: Red Fascia Exposed: No Necrotic Amount: Small (1-33%) Fat Layer (Subcutaneous Tissue) Exposed: Yes Necrotic Quality: Adherent Slough Tendon Exposed: No Muscle Exposed: No Joint Exposed: No Bone Exposed: No Periwound Skin Texture Texture Color No Abnormalities Noted: No No Abnormalities Noted: Yes Scarring: Yes Temperature / Pain Temperature: No Abnormality Moisture No Abnormalities Noted: Yes Treatment Notes Wound #1 (Ankle) Wound Laterality: Left Cleanser Soap and Water Discharge Instruction: May shower and wash wound with dial antibacterial soap and water prior to dressing change. Vashe 5.8 (oz) Discharge Instruction: Cleanse the wound with Vashe prior to applying a clean dressing using gauze sponges, not tissue or cotton balls. Peri-Wound Care Sween Lotion (Moisturizing lotion) Discharge Instruction: Apply moisturizing lotion as directed Topical Gentamicin Discharge Instruction: As directed by physician Primary Dressing Endoform 2x2 in Discharge Instruction: Moisten with saline Secondary Dressing Capaldi, [name] ([id number]) [id number] Page 7 of 7 Drawtex 4x4 in Discharge Instruction: Apply over primary dressing as directed. Woven Gauze Sponge, Non-Sterile 4x4 in Discharge Instruction: Apply over primary dressing as directed. Secured With Compression Wrap Urgo K2 Lite, two layer compression system,  regular Discharge Instruction: Apply Urgo K2 Lite as directed (alternative to 3 layer compression). Compression Stockings Add-Ons Electronic Signature(s) Signed: 11/12/2022 4:42:28 PM By: [name], BSN Entered By: Zenaida Deed on 11/12/2022 08:35:33 -------------------------------------------------------------------------------- Vitals Details Patient Name: Date of Service: [name],  [name] 11/12/2022 8:15 A M Medical Record Number: [id number] Patient Account Number: [id number] Date of Birth/Sex: Treating RN: [birth year] (47 y.o. M) Primary Care Ramzey Petrovic: PA [place], NO Other Clinician: Referring Lyrick Worland: Treating Halaina Vanduzer/Extender: Horton Marshall in Treatment: 92 Vital Signs Time Taken: 08:19 Temperature (F): 97.6 Height (in): 69 Pulse (bpm): 66 Weight (lbs): 170 Respiratory Rate (breaths/min): 20 Body Mass Index (BMI): 25.1 Blood Pressure (mmHg): 132/85 Reference Range: 80 - 120 mg / dl Electronic Signature(s) Signed: 11/12/2022 4:42:28 PM By: [name], BSN Entered By: Zenaida Deed on 11/12/2022 08:30:57

## 2022-11-12 NOTE — Progress Notes (Signed)
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 [name] tremendous deterioration. There is some slough on the wound surface. No significant drainage or odor. 03/23/2022: It has been [name] month since his last visit. He has had the same 3 layer compression wrap in situ since that time. 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 Remarkably, the wound is [name] little bit smaller today with just [name] layer of slough on the  surface. 03/29/2022: His wound measured slightly larger today. There is slough accumulation on the surface. It also looks as though his footwear is rubbing on his foot and may be also causing some friction at the ankle where his wound is. 04/07/2022: The wound was [name] little bit narrower today. He continues to have slough overlying [name] somewhat fibrotic surface. It appears that he has rectified the situation with his foot wear and I do not see any further evidence of friction trauma. 04/15/2022: No significant change to his wound today. There is still slough on [name] fibrous surface. 04/22/2022: The wound measured slightly smaller today. The surface is much cleaner and has [name] more robust pinkoored color. It is still fairly fibrotic. 05/17/2022: The patient has not been in clinic for nearly 4 weeks. His wrap has remained in place the entire time. The wound measures [name] little bit smaller today. There is slough accumulation. It remains fibrotic. 05/25/2022: The wound surface is improved, with less fibrosis and [name] more pink color. There is slough on the surface with some periwound eschar. 06/01/2022: The wound is [name] little bit smaller again today and the surface continues to improve. Still with slough buildup, but no concern for infection. 06/21/2022: The patient was absent from clinic the past couple of weeks. He returns today and the wound seems to have deteriorated somewhat. Through his [name], he reports that at his job, he stands basically immobile for prolonged periods of time and his feet and legs are constantly wet, meaning  the wound is wet throughout his work shifts. He is unable to get [name] waterproof boot over his leg due to the inflexibility of his ankle. 06/29/2022: The wound is about the same size, but it is quite clean and the surface has more of [name] pink color. His employment has told him that they cannot make any further accommodations for him. 07/06/2022: The wound is smaller this week. There is [name] light  layer of slough on the surface, but the drainage on his dressing has the typical blue-green color of Pseudomonas. 12/12; this is [name] patient to be admitted earlier in the year with [name] extensive wound across the left lateral ankle and into the anterior ankle. Initially [name] immigrant from the [place]. He speaks [place] leads and we interviewed him through [name] [name]. He has been using endoform on the remanent of the wound. Intake nurse tells [place] that we have healed this out but it reopens. He is no longer working 07/21/2022: The wound is smaller today. There is slough on the surface, but good granulation tissue underlying. 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 08/06/2022: The wound is [name] little bit smaller again today. There is thick slough on the surface, but the surface underneath is less fibrotic. 08/31/2022: The patient has missed [name] couple of appointments. T oday, his foot and leg are completely macerated. He says that he has been using [name] plastic bag for showers and that it leaked. The wound is larger, has [name] thick layer of slough on the surface and is malodorous. 09/07/2022: The wound looks better this week. It is smaller with some slough on the surface. [name] small satellite wound has opened up just proximal to the main site. 09/15/2022: The wound has [name] lot more slough on it this week and the satellite wound is larger. He says that he has been standing quite [name] bit at work. 09/22/2022: The satellite lesion is about the same size but much more superficial. The main wound is [name] also about the same size, but has [name] healthier-looking surface. There is slough accumulation on both surfaces. 09/29/2022: The main wound is smaller. The satellite lesion is about the same size. Both have slough on the surface. 10/06/2022: Both wounds are smaller. There is slough and eschar buildup in both sites. 3/13; patient presents for follow-up. We have been using antibiotic ointment with endoform under 3 layer compression.  Patient has no issues or complaints today. 10/21/2022: The wound is smaller today. Both the main wound and satellite lesion are about [name] third smaller than the last time I saw them. There is some slough on both surfaces. 10/29/2022: The satellite lesion has closed. The main wound is smaller again today with just some slough and eschar present. 11/05/2022: The satellite lesion unfortunately reopened, but it is quite small and superficial under some eschar. The main wound continues to contract. There is slough on the surface, but epithelium is creeping down the sides of the divot. 11/12/2022: The satellite lesion has closed again. The main wound is about the same size, but the surface appearance is more pink and viable. Patient History Information obtained from Patient. Family History Unknown History. Social History Current every day smoker, Marital Status - Married, Alcohol Use - Rarely, Drug Use - No History, Caffeine Use - Moderate. Medical [name] Surgical History Notes nd Gastrointestinal Chronic Gastritis [name], [name] ([id number]) [id number] Page 10 of 12 Objective Constitutional no acute distress. Vitals Time Taken: 8:19 AM, Height: 69 in, Weight: 170 lbs, BMI:  25.1, Temperature: 97.6 F, Pulse: 66 bpm, Respiratory Rate: 20 breaths/min, Blood Pressure: 132/85 mmHg. Respiratory Normal work of breathing on room air. General Notes: 11/12/2022: The satellite lesion has closed again. The main wound is about the same size, but the surface appearance is more pink and viable. Integumentary (Hair, Skin) Wound #1 status is Open. Original cause of wound was Trauma. The date acquired was: 10/14/2020. The wound has been in treatment 92 weeks. The wound is located on the Left Ankle. The wound measures 0.8cm length x 1.4cm width x 0.2cm depth; 0.88cm^2 area and 0.176cm^3 volume. There is Fat Layer (Subcutaneous Tissue) exposed. There is no tunneling or undermining noted.  There is [name] medium amount of serous drainage noted. The wound margin is flat and intact. There is large (67-100%) red granulation within the wound bed. There is [name] small (1-33%) amount of necrotic tissue within the wound bed including Adherent Slough. The periwound skin appearance had no abnormalities noted for moisture. The periwound skin appearance had no abnormalities noted for color. The periwound skin appearance exhibited: Scarring. Periwound temperature was noted as No Abnormality. Assessment Active Problems ICD-10 Non-pressure chronic ulcer of left ankle with other specified severity Chronic venous hypertension (idiopathic) with ulcer and inflammation of left lower extremity Procedures Wound #1 Pre-procedure diagnosis of Wound #1 is [name] Venous Leg Ulcer located on the Left Ankle .Severity of Tissue Pre Debridement is: Fat layer exposed. There was [name] Selective/Open Wound Non-Viable Tissue Debridement with [name] total area of 1.12 sq cm performed by Duanne Guess, MD. With the following instrument(s): Curette to remove Non-Viable tissue/material. Material removed includes Eschar and Slough and after achieving pain control using Lidocaine 4% T opical Solution. No specimens were taken. [name] time out was conducted at 09:00, prior to the start of the procedure. [name] Minimum amount of bleeding was controlled with Pressure. The procedure was tolerated well with [name] pain level of 0 throughout and [name] pain level of 0 following the procedure. Post Debridement Measurements: 0.8cm length x 1.4cm width x 0.2cm depth; 0.176cm^3 volume. Character of Wound/Ulcer Post Debridement is stable. Severity of Tissue Post Debridement is: Fat layer exposed. Post procedure Diagnosis Wound #1: Same as Pre-Procedure General Notes: Scribed for Dr. Lady Gary by Zenaida Deed, RN. Pre-procedure diagnosis of Wound #1 is [name] Venous Leg Ulcer located on the Left Ankle . There was [name] Double Layer Compression Therapy Procedure by Zenaida Deed,  RN. Post procedure Diagnosis Wound #1: Same as Pre-Procedure Notes: urgo lite. Plan Follow-up Appointments: Return Appointment in 1 week. - Dr. Lady Gary - Room 4 Thursday 4/18 @ 11:00 am Other: - Kinyarwanda [name] required. Anesthetic: (In clinic) Topical Lidocaine 4% applied to wound bed Bathing/ Shower/ Hygiene: Other Bathing/Shower/Hygiene Orders/Instructions: - Please use [name] " [employer]" when showering. This can be purchased from [employer], Medical supply store,[employer]. Cost is approximately $14-27. Edema Control - Lymphedema / SCD / Other: Elevate legs to the level of the heart or above for 30 minutes daily and/or when sitting for 3-4 times [name] day throughout the day. Avoid standing for long periods of time. Exercise regularly Off-Loading: Open toe surgical shoe to: - left foot WOUND #1: - Ankle Wound Laterality: Left Cleanser: Soap and Water 1 x Per Week/ Discharge Instructions: May shower and wash wound with dial antibacterial soap and water prior to dressing change. Cleanser: Vashe 5.8 (oz) 1 x Per Week/ Hoar, [name] ([id number]) [phone].pdf Page 11 of 12 Discharge Instructions: Cleanse the wound with Vashe prior to applying [name] clean dressing using  gauze sponges, not tissue or cotton balls. Peri-Wound Care: Sween Lotion (Moisturizing lotion) 1 x Per Week/ Discharge Instructions: Apply moisturizing lotion as directed Topical: Gentamicin 1 x Per Week/ Discharge Instructions: As directed by physician Prim Dressing: Endoform 2x2 in 1 x Per Week/ ary Discharge Instructions: Moisten with saline Secondary Dressing: Drawtex 4x4 in 1 x Per Week/ Discharge Instructions: Apply over primary dressing as directed. Secondary Dressing: Woven Gauze Sponge, Non-Sterile 4x4 in 1 x Per Week/ Discharge Instructions: Apply over primary dressing as directed. Com pression Wrap: Urgo K2 Lite, two layer compression system, regular 1 x Per Week/ Discharge  Instructions: Apply Urgo K2 Lite as directed (alternative to 3 layer compression). 11/12/2022: The satellite lesion has closed again. The main wound is about the same size, but the surface appearance is more pink and viable. I used [name] curette to debride slough and eschar from his wound. We will continue topical gentamicin with endoform and 3 layer compression. Follow-up in 1 week. Electronic Signature(s) Signed: 11/12/2022 9:21:10 AM By: [name] FACS Entered By: Duanne Guess on 11/12/2022 09:21:10 -------------------------------------------------------------------------------- HxROS Details Patient Name: Date of Service: [name], [name] [name] 11/12/2022 8:15 [name] M Medical Record Number: [id number] Patient Account Number: [id number] Date of Birth/Sex: Treating RN: 21-Jun-1976 (47 y.o. M) Primary Care Provider: PA Zenovia Jordan, NO Other Clinician: Referring Provider: Treating Provider/Extender: Horton Marshall in Treatment: 92 Information Obtained From Patient Gastrointestinal Medical History: Past Medical History Notes: Chronic Gastritis Immunizations Pneumococcal Vaccine: Received Pneumococcal Vaccination: No Implantable Devices No devices added Family and Social History Unknown History: Yes; Current every day smoker; Marital Status - Married; Alcohol Use: Rarely; Drug Use: No History; Caffeine Use: Moderate; Financial Concerns: No; Food, Clothing or Shelter Needs: No; Support System Lacking: No; Transportation Concerns: No Electronic Signature(s) Signed: 11/12/2022 11:32:38 AM By: [name] FACS Entered By: Duanne Guess on 11/12/2022 09:18:15 -------------------------------------------------------------------------------- SuperBill Details Patient Name: Date of Service: [name] 11/12/2022 Medical Record Number: [id number] Patient Account Number: [id number] Date of Birth/Sex: Treating RN: [birth year] (47 y.o. M) Primary Care  Provider: PA Zenovia Jordan, NO Other Clinician: Referring Provider: Treating Provider/Extender: Horton Marshall in Treatment: [address] [place], [name] ([id number]) [id number] Page 12 of 12 Diagnosis Coding ICD-10 Codes Code Description L97.328 Non-pressure chronic ulcer of left ankle with other specified severity I87.332 Chronic venous hypertension (idiopathic) with ulcer and inflammation of left lower extremity Facility Procedures : CPT4 Code: [id number] Description: 97597 - DEBRIDE WOUND 1ST 20 SQ CM OR < ICD-10 Diagnosis Description L97.328 Non-pressure chronic ulcer of left ankle with other specified severity Modifier: Quantity: 1 Physician Procedures : CPT4 Code Description Modifier [id number] - WC PHYS LEVEL 4 - EST PT 25 ICD-10 Diagnosis Description L97.328 Non-pressure chronic ulcer of left ankle with other specified severity I87.332 Chronic venous hypertension (idiopathic) with ulcer and  inflammation of left lower extremity Quantity: 1 : [id number] 97597 - WC PHYS DEBR WO ANESTH 20 SQ CM ICD-10 Diagnosis Description L97.328 Non-pressure chronic ulcer of left ankle with other specified severity Quantity: 1 Electronic Signature(s) Signed: 11/12/2022 9:21:23 AM By: [name] FACS Entered By: Duanne Guess on 11/12/2022 64:40:34

## 2022-11-14 IMAGING — DX DG ANKLE COMPLETE 3+V*L*
1 series · 1 of 1 positions shown · non-contrast
Comparison: 01/29/2021

CLINICAL DATA: Chronic left ankle ulceration

EXAM:
LEFT ANKLE COMPLETE - 3+ VIEW

[ankle obl]
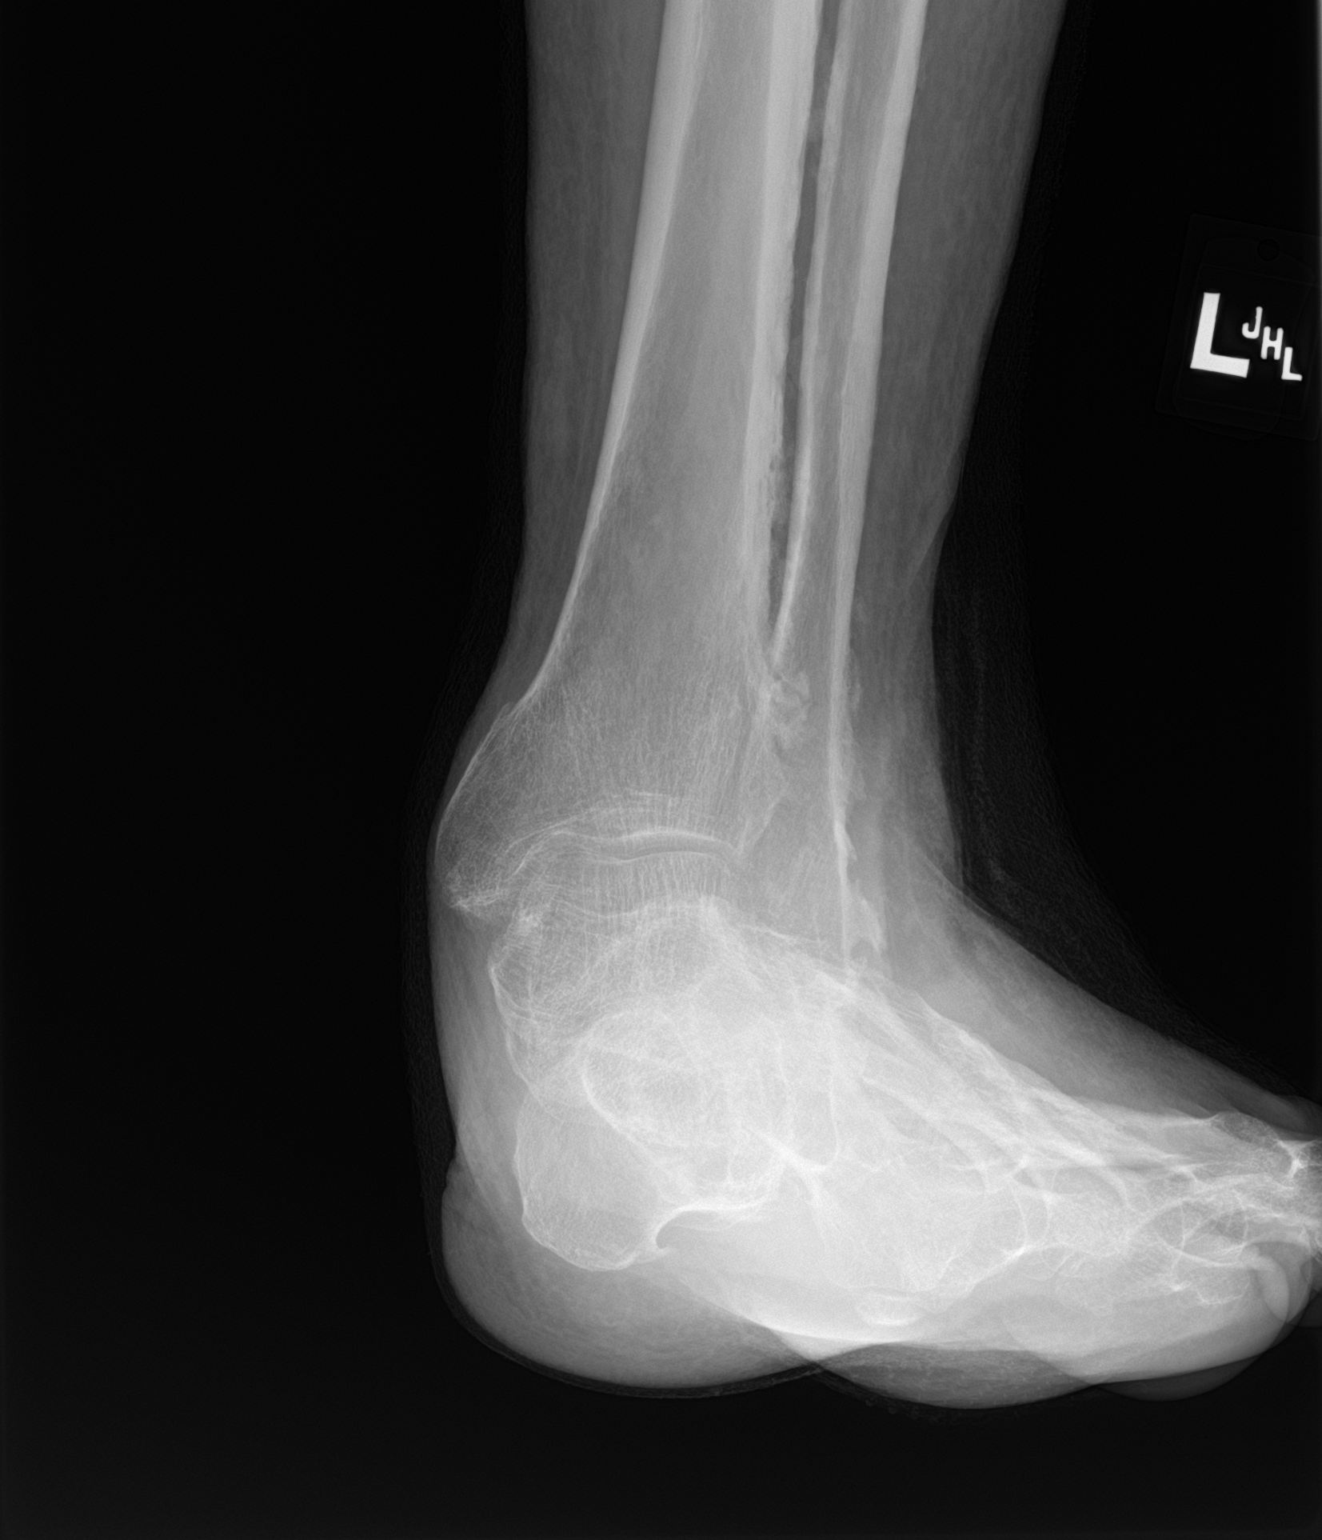

[1 of 1 positions shown; findings below may reference images not displayed]

FINDINGS: Frontal, oblique, lateral views of the left ankle are obtained.
Marked degenerative changes of the ankle and hindfoot are again
noted unchanged. No acute displaced fracture. No destructive bony
lesions. Stable heterotopic ossification throughout the tibiofibular
syndesmosis. Diffuse soft tissue edema again noted.
IMPRESSION: 1. Diffuse soft tissue edema.
2. Stable degenerative changes. No acute or destructive bony lesion.

## 2022-11-18 ENCOUNTER — Encounter (HOSPITAL_BASED_OUTPATIENT_CLINIC_OR_DEPARTMENT_OTHER): Payer: Medicaid Other | Admitting: General Surgery

## 2022-11-18 DIAGNOSIS — L97328 Non-pressure chronic ulcer of left ankle with other specified severity: Secondary | ICD-10-CM | POA: Diagnosis not present

## 2022-11-18 DIAGNOSIS — T708XXD Other effects of air pressure and water pressure, subsequent encounter: Secondary | ICD-10-CM | POA: Diagnosis not present

## 2022-11-18 DIAGNOSIS — M85872 Other specified disorders of bone density and structure, left ankle and foot: Secondary | ICD-10-CM | POA: Diagnosis not present

## 2022-11-18 DIAGNOSIS — L97529 Non-pressure chronic ulcer of other part of left foot with unspecified severity: Secondary | ICD-10-CM | POA: Diagnosis not present

## 2022-11-18 DIAGNOSIS — L97322 Non-pressure chronic ulcer of left ankle with fat layer exposed: Secondary | ICD-10-CM | POA: Diagnosis not present

## 2022-11-18 DIAGNOSIS — I87332 Chronic venous hypertension (idiopathic) with ulcer and inflammation of left lower extremity: Secondary | ICD-10-CM | POA: Diagnosis not present

## 2022-11-18 DIAGNOSIS — I872 Venous insufficiency (chronic) (peripheral): Secondary | ICD-10-CM | POA: Diagnosis not present

## 2022-11-18 DIAGNOSIS — F17200 Nicotine dependence, unspecified, uncomplicated: Secondary | ICD-10-CM | POA: Diagnosis not present

## 2022-11-19 NOTE — Progress Notes (Signed)
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 of weeks. He returns today and the wound seems to have deteriorated somewhat. Through his interpreter, he  reports that at his job, he stands basically immobile for prolonged periods of time and his feet and legs are constantly wet, meaning the wound is wet throughout his work shifts. He is unable to get a waterproof boot over his leg due to the inflexibility of his ankle. 06/29/2022: The wound is about the same size, but it is quite clean and the surface has more of a pink color. His  employment has told him that they cannot make any further accommodations for him. 07/06/2022: The wound is smaller this week. There is a light layer of slough on the surface, but the drainage on his dressing has the typical blue-green color of Pseudomonas. 12/12; this is a patient to be admitted earlier in the year with [name] extensive wound across the left lateral ankle and into the anterior ankle. Initially [name] immigrant from the [place]. He speaks [place] leads and we interviewed him through [name] interpreter. He has been using endoform on the remanent of the wound. Intake nurse tells [place] that we have healed this out but it reopens. He is no longer working 07/21/2022: The wound is smaller today. There is slough on the surface, but good granulation tissue underlying. 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 08/06/2022: The wound is a little bit smaller again today. There is thick slough on the surface, but the surface underneath is less fibrotic. 08/31/2022: The patient has missed a couple of appointments. T oday, his foot and leg are completely macerated. He says that he has been using a plastic bag for showers and that it leaked. The wound is larger, has a thick layer of slough on the surface and is malodorous. 09/07/2022: The wound looks better this week. It is smaller with some slough on the surface. A small satellite wound has opened up just proximal to the main site. 09/15/2022: The wound has a lot more slough on it this week and the satellite wound is larger. He says that he has been standing quite a bit at work. 09/22/2022: The satellite lesion is about the same size but much more superficial. The main wound is a also about the same size, but has a healthier-looking surface. There is slough accumulation on both surfaces. 09/29/2022: The main wound is smaller. The satellite lesion is about the same size. Both have slough on the surface. 10/06/2022: Both wounds are smaller. There is slough and  eschar buildup in both sites. 3/13; patient presents for follow-up. We have been using antibiotic ointment with endoform under 3 layer compression. Patient has no issues or complaints today. 10/21/2022: The wound is smaller today. Both the main wound and satellite lesion are about a third smaller than the last time I saw them. There is some slough on both surfaces. 10/29/2022: The satellite lesion has closed. The main wound is smaller again today with just some slough and eschar present. 11/05/2022: The satellite lesion unfortunately reopened, but it is quite small and superficial under some eschar. The main wound continues to contract. There is slough on the surface, but epithelium is creeping down the sides of the divot. 11/12/2022: The satellite lesion has closed again. The main wound is about the same size, but the surface appearance is more pink and viable. 11/18/2022: The wound is a little smaller today. There is slough on the surface. Patient History Information obtained from Patient. [name], [name] ([id number]) [id number] Page 10 of 12 Family History Unknown History. Social History Current every day smoker, Marital Status - Married, Alcohol Use -  Rarely, Drug Use - No History, Caffeine Use - Moderate. Medical A Surgical History Notes nd Gastrointestinal Chronic Gastritis Objective Constitutional no acute distress. Vitals Time Taken: 11:09 AM, Height: 69 in, Weight: 170 lbs, BMI: 25.1, Temperature: 97.3 F, Pulse: 71 bpm, Respiratory Rate: 18 breaths/min, Blood Pressure: 118/69 mmHg. Respiratory Normal work of breathing on room air. General Notes: 11/18/2022: The wound is a little smaller today. There is slough on the surface. Integumentary (Hair, Skin) Wound #1 status is Open. Original cause of wound was Trauma. The date acquired was: 10/14/2020. The wound has been in treatment 93 weeks. The wound is located on the Left Ankle. The wound measures 1.6cm  length x 1.7cm width x 0.2cm depth; 2.136cm^2 area and 0.427cm^3 volume. There is Fat Layer (Subcutaneous Tissue) exposed. There is no tunneling or undermining noted. There is a medium amount of serous drainage noted. The wound margin is flat and intact. There is large (67-100%) red granulation within the wound bed. There is a small (1-33%) amount of necrotic tissue within the wound bed including Adherent Slough. The periwound skin appearance had no abnormalities noted for moisture. The periwound skin appearance had no abnormalities noted for color. The periwound skin appearance exhibited: Scarring. Periwound temperature was noted as No Abnormality. 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 There was a Selective/Open Wound Non-Viable Tissue Debridement with a total area of 2.72 sq cm performed by [name] Guess, MD. With the following instrument(s): Curette to remove Non-Viable tissue/material. Material removed includes [facility] after achieving pain control using Lidocaine 4% T opical Solution. No specimens were taken. A time out was conducted at 11:20, prior to the start of the procedure. A Minimum amount of bleeding was controlled with Pressure. The procedure was tolerated well with a pain level of 0 throughout and a pain level of 0 following the procedure. Post Debridement Measurements: 1.6cm length x 1.7cm width x 0.2cm depth; 0.427cm^3 volume. Character of Wound/Ulcer Post Debridement is improved. Severity of Tissue Post Debridement is: Fat layer exposed. Post procedure Diagnosis Wound #1: Same as Pre-Procedure General Notes: Scribed for Dr [name] by [name] Pulling,  RN. 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 There was a Three Layer Compression Therapy Procedure by [name] Pulling, RN. Post procedure Diagnosis Wound #1: Same as Pre-Procedure Plan Follow-up Appointments: Return Appointment in 1 week. - Dr. Lady [name] - Room 4 Other: - [place] interpreter required. Anesthetic: [name], [name] ([id number]) [id number] Page 11 of 12 (In clinic) Topical Lidocaine 4% applied to wound bed Bathing/ Shower/ Hygiene: Other Bathing/Shower/Hygiene Orders/Instructions: - Please use a " CAST PROTECTOR" when showering. This can be purchased from [employer], Medical supply store,[employer]. Cost is approximately $14-27. Edema Control - Lymphedema / SCD / Other: Elevate legs to the level of the heart or above for 30 minutes daily and/or when sitting for 3-4 times a day throughout the day. Avoid standing for long periods of time. Exercise regularly Off-Loading: Open toe surgical shoe to: - left foot The following medication(s) was prescribed: lidocaine topical 4 % cream cream topical once daily for prior to debridement was prescribed at facility WOUND #1: - Ankle Wound Laterality: Left Cleanser: Soap and Water 1 x Per Week/ Discharge Instructions:  May shower and wash wound with dial antibacterial soap and water prior to dressing change. Cleanser: Vashe 5.8 (oz) 1 x Per Week/ Discharge Instructions: Cleanse the wound with Vashe prior to applying a clean dressing using gauze sponges, not tissue or cotton balls. Peri-Wound Care: Sween Lotion (Moisturizing lotion) 1 x Per Week/ Discharge Instructions: Apply moisturizing lotion as directed Topical: Gentamicin 1 x Per Week/ Discharge Instructions: As directed by physician Prim Dressing: Endoform 2x2 in 1 x Per Week/ ary Discharge Instructions: Moisten with saline Secondary Dressing: Drawtex 4x4 in 1 x Per Week/ Discharge Instructions: Apply over  primary dressing as directed. Secondary Dressing: Woven Gauze Sponge, Non-Sterile 4x4 in 1 x Per Week/ Discharge Instructions: Apply over primary dressing as directed. Com pression Wrap: Urgo K2 Lite, two layer compression system, regular 1 x Per Week/ Discharge Instructions: Apply Urgo K2 Lite as directed (alternative to 3 layer compression). 11/18/2022: The wound is a little smaller today. There is slough on the surface. I used a curette to debride the slough from the wound surface. We will continue topical gentamicin, endoform, and drawtex. Continue 3 layer compression. He will follow-up in 1 week. Electronic Signature(s) Signed: 11/18/2022 11:57:50 AM By: [name] Guess MD FACS Entered By: [name] on 11/18/2022 11:57:50 -------------------------------------------------------------------------------- HxROS Details Patient Name: Date of Service: [name], [name] 11/18/2022 11:00 A M Medical Record Number: [id number] Patient Account Number: [id number] Date of Birth/Sex: Treating RN: 1976/06/26 (47 y.o. M) Primary Care Provider: PA [name], NO Other Clinician: Referring Provider: Treating Provider/Extender: [name] in Treatment: 93 Information Obtained From Patient Gastrointestinal Medical History: Past Medical History Notes: Chronic Gastritis Immunizations Pneumococcal Vaccine: Received Pneumococcal Vaccination: No Implantable Devices No devices added Family and Social History Unknown History: Yes; Current every day smoker; Marital Status - Married; Alcohol Use: Rarely; Drug Use: No History; Caffeine Use: Moderate; Financial Concerns: No; Food, Clothing or Shelter Needs: No; Support System Lacking: No; Transportation Concerns: No Electronic Signature(s) Signed: 11/18/2022 12:09:09 PM By: [name] Guess MD FACS Entered By: [name] on 11/18/2022 11:56:12 [name], [name] ([id number]) [id number] Page  12 of 12 -------------------------------------------------------------------------------- SuperBill Details Patient Name: Date of Service: [name] 11/18/2022 Medical Record Number: [id number] Patient Account Number: [id number] Date of Birth/Sex: Treating RN: Jul 19, 1976 (46 y.o. M) Primary Care Provider: PA TIENT, NO Other Clinician: Referring Provider: Treating Provider/Extender: [name] in Treatment: 93 Diagnosis Coding ICD-10 Codes Code Description L97.328 Non-pressure chronic ulcer of left ankle with other specified severity I87.332 Chronic venous hypertension (idiopathic) with ulcer and inflammation of left lower extremity Facility Procedures : CPT4 Code: [id number] Description: 97597 - DEBRIDE WOUND 1ST 20 SQ CM OR < ICD-10 Diagnosis Description L97.328 Non-pressure chronic ulcer of left ankle with other specified severity Modifier: Quantity: 1 Physician Procedures : CPT4 Code Description Modifier [id number] - WC PHYS LEVEL 4 - EST PT 25 ICD-10 Diagnosis Description L97.328 Non-pressure chronic ulcer of left ankle with other specified severity I87.332 Chronic venous hypertension (idiopathic) with ulcer and  inflammation of left lower extremity Quantity: 1 : [id number] 97597 - WC PHYS DEBR WO ANESTH 20 SQ CM ICD-10 Diagnosis Description L97.328 Non-pressure chronic ulcer of left ankle with other specified severity Quantity: 1 Electronic Signature(s) Signed: 11/18/2022 11:58:04 AM By: [name] Guess MD FACS Entered By: [name] on 11/18/2022 11:58:04

## 2022-11-19 NOTE — Progress Notes (Signed)
Carino, [name] ([id number]) [id number] Page 1 of 7 Visit Report for 11/18/2022 Arrival Information Details Patient Name: Date of Service: [name] [place] NIE 11/18/2022 11:00 A M Medical Record Number: [id number] Patient Account Number: [id number] Date of Birth/Sex: Treating RN: 01-08-1976 (47 y.o. Cline Cools Primary Care Cary Lothrop: PA TIENT, NO Other Clinician: Referring Tariyah Pendry: Treating Jezreel Justiniano/Extender: Horton Marshall in Treatment: 21 Visit Information History Since Last Visit Added or deleted any medications: No Patient Arrived: Ambulatory Any new allergies or adverse reactions: No Arrival Time: 11:07 Had a fall or experienced change in No Accompanied By: interpreter activities of daily living that may affect Transfer Assistance: None risk of falls: Patient Identification Verified: Yes Signs or symptoms of abuse/neglect since last visito No Secondary Verification Process Completed: Yes Hospitalized since last visit: No Patient Requires Transmission-Based Precautions: No Implantable device outside of the clinic excluding No Patient Has Alerts: No cellular tissue based products placed in the center since last visit: Has Dressing in Place as Prescribed: Yes Has Compression in Place as Prescribed: Yes Pain Present Now: Yes Electronic Signature(s) Signed: 11/18/2022 3:58:17 PM By: Redmond Pulling RN, BSN Entered By: Redmond Pulling on 11/18/2022 11:08:49 -------------------------------------------------------------------------------- Compression Therapy Details Patient Name: Date of Service: [name] 11/18/2022 11:00 A M Medical Record Number: [id number] Patient Account Number: [id number] Date of Birth/Sex: Treating RN: [birth year] (47 y.o. Cline Cools Primary Care Ceaser Ebeling: PA Zenovia Jordan, [place] Other Clinician: Referring Flavius Repsher: Treating Dorita Rowlands/Extender: Horton Marshall in  Treatment: 84 Compression Therapy Performed for Wound Assessment: Wound #1 Left Ankle Performed By: Clinician Redmond Pulling, RN Compression Type: Three Layer Post Procedure Diagnosis Same as Pre-procedure Electronic Signature(s) Signed: 11/18/2022 3:58:17 PM By: Redmond Pulling RN, BSN Entered By: Redmond Pulling on 11/18/2022 11:28:18 -------------------------------------------------------------------------------- Encounter Discharge Information Details Patient Name: Date of Service: [name] 11/18/2022 11:00 A M Medical Record Number: [id number] Patient Account Number: [id number] Date of Birth/Sex: Treating RN: 02/11/1976 (47 y.o. Cline Cools Primary Care Xzavior Reinig: PA Zenovia Jordan, [place] Other Clinician: Referring Honest Vanleer: Treating Riki Gehring/Extender: Horton Marshall in Treatment: 84 Encounter Discharge Information Items Post Procedure Vitals Discharge Condition: Stable Temperature (F): 97.3 Ambulatory Status: Ambulatory Pulse (bpm): 71 Discharge Destination: Home Respiratory Rate (breaths/min): 18 Transportation: Private Auto Blood Pressure (mmHg): 118/69 Byus, [name] ([id number]) [id number] Page 2 of 7 Accompanied By: interpreter Schedule Follow-up Appointment: Yes Clinical Summary of Care: Patient Declined Electronic Signature(s) Signed: 11/18/2022 3:58:17 PM By: Redmond Pulling RN, BSN Entered By: Redmond Pulling on 11/18/2022 11:45:29 -------------------------------------------------------------------------------- Lower Extremity Assessment Details Patient Name: Date of Service: [name] 11/18/2022 11:00 A M Medical Record Number: [id number] Patient Account Number: [id number] Date of Birth/Sex: Treating RN: 11/09/1975 (47 y.o. Cline Cools Primary Care Plumer Mittelstaedt: PA Zenovia [place], [place] Other Clinician: Referring Aeva Posey: Treating Aharon Carriere/Extender: Horton Marshall in Treatment:  93 Edema Assessment Assessed: [Left: No] [Right: No] Edema: [Left: Ye] [Right: s] Calf Left: Right: Point of Measurement: 28 cm From Medial Instep 29.8 cm Ankle Left: Right: Point of Measurement: 8 cm From Medial Instep 22 cm Vascular Assessment Pulses: Dorsalis Pedis Palpable: [Left:Yes] Electronic Signature(s) Signed: 11/18/2022 3:58:17 PM By: Redmond Pulling RN, BSN Entered By: Redmond Pulling on 11/18/2022 11:16:35 -------------------------------------------------------------------------------- Multi Wound Chart Details Patient Name: Date of Service: [name] 11/18/2022 11:00 A M Medical Record Number: [id number] Patient Account Number: [id number] Date of Birth/Sex: Treating RN: 05-29-1976 (47 y.o. M) Primary Care Tuleen Mandelbaum: PA TIENT, NO  Other Clinician: Referring Kalese Ensz: Treating Terrica Duecker/Extender: Horton Marshall in Treatment: 93 Vital Signs Height(in): 69 Pulse(bpm): 71 Weight(lbs): 170 Blood Pressure(mmHg): 118/69 Body Mass Index(BMI): 25.1 Temperature(F): 97.3 Respiratory Rate(breaths/min): 18 [1:Photos:] [N/A:N/A] Left Ankle N/A N/A Wound Location: Trauma N/A N/A Wounding Event: Venous Leg Ulcer N/A N/A Primary Etiology: 10/14/2020 N/A N/A Date Acquired: 28 N/A N/A Weeks of Treatment: Open N/A N/A Wound Status: No N/A N/A Wound Recurrence: 1.6x1.7x0.2 N/A N/A Measurements L x W x D (cm) 2.136 N/A N/A A (cm) : rea 0.427 N/A N/A Volume (cm) : 96.50% N/A N/A % Reduction in A rea: 98.30% N/A N/A % Reduction in Volume: Full Thickness Without Exposed N/A N/A Classification: Support Structures Medium N/A N/A Exudate A mount: Serous N/A N/A Exudate Type: amber N/A N/A Exudate Color: Flat and Intact N/A N/A Wound Margin: Large (67-100%) N/A N/A Granulation A mount: Red N/A N/A Granulation Quality: Small (1-33%) N/A N/A Necrotic A mount: Fat Layer (Subcutaneous Tissue): Yes N/A N/A Exposed  Structures: Fascia: No Tendon: No Muscle: No Joint: No Bone: No Large (67-100%) N/A N/A Epithelialization: Debridement - Selective/Open Wound N/A N/A Debridement: Pre-procedure Verification/Time Out 11:20 N/A N/A Taken: Lidocaine 4% Topical Solution N/A N/A Pain Control: Slough N/A N/A Tissue Debrided: Non-Viable Tissue N/A N/A Level: 2.72 N/A N/A Debridement A (sq cm): rea Curette N/A N/A Instrument: Minimum N/A N/A Bleeding: Pressure N/A N/A Hemostasis A chieved: 0 N/A N/A Procedural Pain: 0 N/A N/A Post Procedural Pain: Procedure was tolerated well N/A N/A Debridement Treatment Response: 1.6x1.7x0.2 N/A N/A Post Debridement Measurements L x W x D (cm) 0.427 N/A N/A Post Debridement Volume: (cm) Scarring: Yes N/A N/A Periwound Skin Texture: Maceration: No N/A N/A Periwound Skin Moisture: Dry/Scaly: No No Abnormalities Noted N/A N/A Periwound Skin Color: No Abnormality N/A N/A Temperature: Compression Therapy N/A N/A Procedures Performed: Debridement Treatment Notes Wound #1 (Ankle) Wound Laterality: Left Cleanser Soap and Water Discharge Instruction: May shower and wash wound with dial antibacterial soap and water prior to dressing change. Vashe 5.8 (oz) Discharge Instruction: Cleanse the wound with Vashe prior to applying a clean dressing using gauze sponges, not tissue or cotton balls. Peri-Wound Care Sween Lotion (Moisturizing lotion) Discharge Instruction: Apply moisturizing lotion as directed Topical Gentamicin Discharge Instruction: As directed by physician Primary Dressing Endoform 2x2 in Discharge Instruction: Moisten with saline Secondary Dressing Drawtex 4x4 in Discharge Instruction: Apply over primary dressing as directed. Woven Gauze Sponge, Non-Sterile 4x4 in Moseman, [name] ([id number]) [id number] Page 4 of 7 Discharge Instruction: Apply over primary dressing as directed. Secured With Compression  Wrap Urgo K2 Lite, two layer compression system, regular Discharge Instruction: Apply Urgo K2 Lite as directed (alternative to 3 layer compression). Compression Stockings Add-Ons Electronic Signature(s) Signed: 11/18/2022 11:51:50 AM By: [name] FACS Entered By: Duanne Guess on 11/18/2022 11:51:50 -------------------------------------------------------------------------------- Multi-Disciplinary Care Plan Details Patient Name: Date of Service: [name] 11/18/2022 11:00 A M Medical Record Number: [id number] Patient Account Number: [id number] Date of Birth/Sex: Treating RN: July 21, 1976 (47 y.o. Cline Cools Primary Care Chip Canepa: PA Zenovia Jordan, [place] Other Clinician: Referring Lilou Kneip: Treating Eddy Liszewski/Extender: Horton Marshall in Treatment: 34 Multidisciplinary Care Plan reviewed with physician Active Inactive Venous Leg Ulcer Nursing Diagnoses: Actual venous Insuffiency (use after diagnosis is confirmed) Knowledge deficit related to disease process and management Goals: Patient will maintain optimal edema control Date Initiated: 02/19/2022 Target Resolution Date: 11/26/2022 Goal Status: Active Interventions: Assess peripheral edema status every visit. Compression as ordered Treatment Activities:  Therapeutic compression applied : 02/19/2022 Notes: Wound/Skin Impairment Nursing Diagnoses: Knowledge deficit related to ulceration/compromised skin integrity Goals: Patient/caregiver will verbalize understanding of skin care regimen Date Initiated: 02/05/2021 Target Resolution Date: 11/26/2022 Goal Status: Active Interventions: Assess patient/caregiver ability to obtain necessary supplies Assess patient/caregiver ability to perform ulcer/skin care regimen upon admission and as needed Provide education on ulcer and skin care Treatment Activities: Skin care regimen initiated : 02/05/2021 Topical wound management initiated :  02/05/2021 Notes: 03/31/21: Wound care regimen ongoing, target date extended. 04/21/21: Wound care ongoing, through interpreter patient states he is doing fine with his dressing changes. Junkin, [name] ([id number]) [id number] Page 5 of 7 Electronic Signature(s) Signed: 11/18/2022 3:58:17 PM By: Redmond Pulling RN, BSN Entered By: Redmond Pulling on 11/18/2022 11:17:04 -------------------------------------------------------------------------------- Pain Assessment Details Patient Name: Date of Service: [name] 11/18/2022 11:00 A M Medical Record Number: [id number] Patient Account Number: [id number] Date of Birth/Sex: Treating RN: Apr 25, 1976 (47 y.o. Cline Cools Primary Care Branae Crail: PA Zenovia Jordan, [place] Other Clinician: Referring Dorin Stooksbury: Treating Lalah Durango/Extender: Horton Marshall in Treatment: 38 Active Problems Location of Pain Severity and Description of Pain Patient Has Paino Yes Site Locations Rate the pain. Current Pain Level: 5 Pain Management and Medication Current Pain Management: Electronic Signature(s) Signed: 11/18/2022 3:58:17 PM By: Redmond Pulling RN, BSN Entered By: Redmond Pulling on 11/18/2022 11:10:04 -------------------------------------------------------------------------------- Patient/Caregiver Education Details Patient Name: Date of Service: [name] 4/18/2024andnbsp11:00 A M Medical Record Number: [id number] Patient Account Number: [id number] Date of Birth/Gender: Treating RN: [birth year] (47 y.o. Cline Cools Primary Care Physician: PA Zenovia Jordan, [place] Other Clinician: Referring Physician: Treating Physician/Extender: Horton Marshall in Treatment: 30 Education Assessment Education Provided To: Patient Education Topics Provided Wound/Skin Impairment: Methods: Explain/Verbal Responses: State content correctly Veals, [name] ([id number])  [id number] Page 6 of 7 Electronic Signature(s) Signed: 11/18/2022 3:58:17 PM By: Redmond Pulling RN, BSN Entered By: Redmond Pulling on 11/18/2022 11:17:19 -------------------------------------------------------------------------------- Wound Assessment Details Patient Name: Date of Service: [name] 11/18/2022 11:00 A M Medical Record Number: [id number] Patient Account Number: [id number] Date of Birth/Sex: Treating RN: [birth year] (47 y.o. Cline Cools Primary Care Celedonio Sortino: PA TIENT, NO Other Clinician: Referring Keiley Levey: Treating Tramell Piechota/Extender: Horton Marshall in Treatment: 93 Wound Status Wound Number: 1 Primary Etiology: Venous Leg Ulcer Wound Location: Left Ankle Wound Status: Open Wounding Event: Trauma Date Acquired: 10/14/2020 Weeks Of Treatment: 93 Clustered Wound: No Photos Wound Measurements Length: (cm) 1.6 Width: (cm) 1.7 Depth: (cm) 0.2 Area: (cm) 2.136 Volume: (cm) 0.427 % Reduction in Area: 96.5% % Reduction in Volume: 98.3% Epithelialization: Large (67-100%) Tunneling: No Undermining: No Wound Description Classification: Full Thickness Without Exposed Suppor Wound Margin: Flat and Intact Exudate Amount: Medium Exudate Type: Serous Exudate Color: amber t Structures Foul Odor After Cleansing: No Slough/Fibrino Yes Wound Bed Granulation Amount: Large (67-100%) Exposed Structure Granulation Quality: Red Fascia Exposed: No Necrotic Amount: Small (1-33%) Fat Layer (Subcutaneous Tissue) Exposed: Yes Necrotic Quality: Adherent Slough Tendon Exposed: No Muscle Exposed: No Joint Exposed: No Bone Exposed: No Periwound Skin Texture Texture Color No Abnormalities Noted: No No Abnormalities Noted: Yes Scarring: Yes Temperature / Pain Temperature: No Abnormality Moisture No Abnormalities Noted: Yes Treatment Notes Wound #1 (Ankle) Wound Laterality: Left [place], [name]  ([id number]) [id number] Page 7 of 7 Cleanser Soap and Water Discharge Instruction: May shower and wash wound with dial antibacterial soap and water prior to dressing change. Vashe 5.8 (oz) Discharge Instruction: Cleanse the wound  with Vashe prior to applying a clean dressing using gauze sponges, not tissue or cotton balls. Peri-Wound Care Sween Lotion (Moisturizing lotion) Discharge Instruction: Apply moisturizing lotion as directed Topical Gentamicin Discharge Instruction: As directed by physician Primary Dressing Endoform 2x2 in Discharge Instruction: Moisten with saline Secondary Dressing Drawtex 4x4 in Discharge Instruction: Apply over primary dressing as directed. Woven Gauze Sponge, Non-Sterile 4x4 in Discharge Instruction: Apply over primary dressing as directed. Secured With Compression Wrap Urgo K2 Lite, two layer compression system, regular Discharge Instruction: Apply Urgo K2 Lite as directed (alternative to 3 layer compression). Compression Stockings Add-Ons Electronic Signature(s) Signed: 11/18/2022 3:58:17 PM By: Redmond Pulling RN, BSN Entered By: Redmond Pulling on 11/18/2022 11:15:54 -------------------------------------------------------------------------------- Vitals Details Patient Name: Date of Service: [name], [name] 11/18/2022 11:00 A M Medical Record Number: [id number] Patient Account Number: [id number] Date of Birth/Sex: Treating RN: 07/12/1976 (47 y.o. Cline Cools Primary Care Brenly Trawick: PA TIENT, NO Other Clinician: Referring Nadege Carriger: Treating Petra Sargeant/Extender: Horton Marshall in Treatment: 93 Vital Signs Time Taken: 11:09 Temperature (F): 97.3 Height (in): 69 Pulse (bpm): 71 Weight (lbs): 170 Respiratory Rate (breaths/min): 18 Body Mass Index (BMI): 25.1 Blood Pressure (mmHg): 118/69 Reference Range: 80 - 120 mg / dl Electronic Signature(s) Signed: 11/18/2022 3:58:17 PM By:  Redmond Pulling RN, BSN Entered By: Redmond Pulling on 11/18/2022 11:09:50

## 2022-11-24 ENCOUNTER — Encounter: Payer: Medicaid Other | Admitting: Family

## 2022-11-24 NOTE — Progress Notes (Signed)
  This encounter was created in error - please disregard. No show 

## 2022-11-25 ENCOUNTER — Encounter (HOSPITAL_BASED_OUTPATIENT_CLINIC_OR_DEPARTMENT_OTHER): Payer: Medicaid Other | Admitting: General Surgery

## 2022-11-25 DIAGNOSIS — I872 Venous insufficiency (chronic) (peripheral): Secondary | ICD-10-CM | POA: Diagnosis not present

## 2022-11-25 DIAGNOSIS — I87332 Chronic venous hypertension (idiopathic) with ulcer and inflammation of left lower extremity: Secondary | ICD-10-CM | POA: Diagnosis not present

## 2022-11-25 DIAGNOSIS — L97529 Non-pressure chronic ulcer of other part of left foot with unspecified severity: Secondary | ICD-10-CM | POA: Diagnosis not present

## 2022-11-25 DIAGNOSIS — T708XXD Other effects of air pressure and water pressure, subsequent encounter: Secondary | ICD-10-CM | POA: Diagnosis not present

## 2022-11-25 DIAGNOSIS — F17200 Nicotine dependence, unspecified, uncomplicated: Secondary | ICD-10-CM | POA: Diagnosis not present

## 2022-11-25 DIAGNOSIS — M85872 Other specified disorders of bone density and structure, left ankle and foot: Secondary | ICD-10-CM | POA: Diagnosis not present

## 2022-11-25 DIAGNOSIS — L97322 Non-pressure chronic ulcer of left ankle with fat layer exposed: Secondary | ICD-10-CM | POA: Diagnosis not present

## 2022-11-25 DIAGNOSIS — L97328 Non-pressure chronic ulcer of left ankle with other specified severity: Secondary | ICD-10-CM | POA: Diagnosis not present

## 2022-12-01 ENCOUNTER — Encounter: Payer: Self-pay | Admitting: Adult Health

## 2022-12-01 ENCOUNTER — Encounter: Payer: Self-pay | Admitting: Family

## 2022-12-01 ENCOUNTER — Ambulatory Visit (INDEPENDENT_AMBULATORY_CARE_PROVIDER_SITE_OTHER): Payer: Medicaid Other | Admitting: Family

## 2022-12-01 ENCOUNTER — Encounter: Payer: Medicaid Other | Admitting: Adult Health

## 2022-12-01 VITALS — BP 125/78 | HR 71 | Temp 98.6°F | Resp 18 | Ht 69.0 in | Wt 152.6 lb

## 2022-12-01 DIAGNOSIS — Z23 Encounter for immunization: Secondary | ICD-10-CM | POA: Diagnosis not present

## 2022-12-01 DIAGNOSIS — Z419 Encounter for procedure for purposes other than remedying health state, unspecified: Secondary | ICD-10-CM | POA: Diagnosis not present

## 2022-12-01 DIAGNOSIS — Z789 Other specified health status: Secondary | ICD-10-CM

## 2022-12-01 DIAGNOSIS — Z1211 Encounter for screening for malignant neoplasm of colon: Secondary | ICD-10-CM

## 2022-12-01 DIAGNOSIS — R1013 Epigastric pain: Secondary | ICD-10-CM | POA: Diagnosis not present

## 2022-12-01 DIAGNOSIS — R350 Frequency of micturition: Secondary | ICD-10-CM | POA: Diagnosis not present

## 2022-12-01 DIAGNOSIS — Z7689 Persons encountering health services in other specified circumstances: Secondary | ICD-10-CM

## 2022-12-01 DIAGNOSIS — Z720 Tobacco use: Secondary | ICD-10-CM

## 2022-12-01 DIAGNOSIS — K219 Gastro-esophageal reflux disease without esophagitis: Secondary | ICD-10-CM

## 2022-12-01 DIAGNOSIS — Z13220 Encounter for screening for lipoid disorders: Secondary | ICD-10-CM | POA: Diagnosis not present

## 2022-12-01 LAB — POCT URINALYSIS DIPSTICK
Blood, UA: NEGATIVE
Glucose, UA: NEGATIVE
Ketones, UA: NEGATIVE
Leukocytes, UA: NEGATIVE
Nitrite, UA: NEGATIVE
Protein, UA: POSITIVE — AB
Spec Grav, UA: 1.015 (ref 1.010–1.025)
Urobilinogen, UA: 0.2 E.U./dL
pH, UA: 8 (ref 5.0–8.0)

## 2022-12-01 MED ORDER — TETANUS-DIPHTH-ACELL PERTUSSIS 5-2.5-18.5 LF-MCG/0.5 IM SUSP: 0.5000 mL | Freq: Once | INTRAMUSCULAR | 0 refills | Status: AC

## 2022-12-01 MED ORDER — BUPROPION HCL ER (SR) 150 MG PO TB12: 150.0000 mg | ORAL_TABLET | Freq: Every day | ORAL | 1 refills | Status: DC
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
## 2022-12-02 ENCOUNTER — Encounter (HOSPITAL_BASED_OUTPATIENT_CLINIC_OR_DEPARTMENT_OTHER): Payer: Medicaid Other | Attending: General Surgery | Admitting: General Surgery

## 2022-12-02 DIAGNOSIS — F17200 Nicotine dependence, unspecified, uncomplicated: Secondary | ICD-10-CM | POA: Diagnosis not present

## 2022-12-02 DIAGNOSIS — M85872 Other specified disorders of bone density and structure, left ankle and foot: Secondary | ICD-10-CM | POA: Diagnosis not present

## 2022-12-02 DIAGNOSIS — I87332 Chronic venous hypertension (idiopathic) with ulcer and inflammation of left lower extremity: Secondary | ICD-10-CM | POA: Insufficient documentation

## 2022-12-02 DIAGNOSIS — L97529 Non-pressure chronic ulcer of other part of left foot with unspecified severity: Secondary | ICD-10-CM | POA: Insufficient documentation

## 2022-12-02 DIAGNOSIS — L97328 Non-pressure chronic ulcer of left ankle with other specified severity: Secondary | ICD-10-CM | POA: Diagnosis not present

## 2022-12-02 DIAGNOSIS — I872 Venous insufficiency (chronic) (peripheral): Secondary | ICD-10-CM | POA: Insufficient documentation

## 2022-12-02 DIAGNOSIS — L97322 Non-pressure chronic ulcer of left ankle with fat layer exposed: Secondary | ICD-10-CM | POA: Diagnosis not present

## 2022-12-02 LAB — CBC WITH DIFFERENTIAL/PLATELET
Absolute Monocytes: 310 cells/uL (ref 200–950)
Basophils Absolute: 33 cells/uL (ref 0–200)
Basophils Relative: 0.5 %
Eosinophils Absolute: 139 cells/uL (ref 15–500)
Eosinophils Relative: 2.1 %
HCT: 47.3 % (ref 38.5–50.0)
Hemoglobin: 16.2 g/dL (ref 13.2–17.1)
Lymphs Abs: 1822 cells/uL (ref 850–3900)
MCH: 33.6 pg — ABNORMAL HIGH (ref 27.0–33.0)
MCHC: 34.2 g/dL (ref 32.0–36.0)
MCV: 98.1 fL (ref 80.0–100.0)
MPV: 9.8 fL (ref 7.5–12.5)
Monocytes Relative: 4.7 %
Neutro Abs: 4297 cells/uL (ref 1500–7800)
Neutrophils Relative %: 65.1 %
Platelets: 259 10*3/uL (ref 140–400)
RBC: 4.82 10*6/uL (ref 4.20–5.80)
RDW: 11.8 % (ref 11.0–15.0)
Total Lymphocyte: 27.6 %
WBC: 6.6 10*3/uL (ref 3.8–10.8)

## 2022-12-02 LAB — LIPID PANEL
Cholesterol: 119 mg/dL (ref <200)
HDL: 38 mg/dL — ABNORMAL LOW (ref >=40)
LDL Cholesterol (Calc): 58 mg/dL (calc)
Non-HDL Cholesterol (Calc): 81 mg/dL (calc) (ref <130)
Total CHOL/HDL Ratio: 3.1 (calc) (ref <5.0)
Triglycerides: 146 mg/dL (ref <150)

## 2022-12-02 LAB — URINE CULTURE
MICRO NUMBER:: 14900339
Result:: NO GROWTH
SPECIMEN QUALITY:: ADEQUATE

## 2022-12-02 LAB — COMPLETE METABOLIC PANEL WITH GFR
AG Ratio: 1.6 (calc) (ref 1.0–2.5)
ALT: 13 U/L (ref 9–46)
AST: 15 U/L (ref 10–40)
Albumin: 4.4 g/dL (ref 3.6–5.1)
Alkaline phosphatase (APISO): 69 U/L (ref 36–130)
BUN: 12 mg/dL (ref 7–25)
CO2: 20 mmol/L (ref 20–32)
Calcium: 9.9 mg/dL (ref 8.6–10.3)
Chloride: 112 mmol/L — ABNORMAL HIGH (ref 98–110)
Creat: 0.89 mg/dL (ref 0.60–1.29)
Globulin: 2.7 g/dL (calc) (ref 1.9–3.7)
Glucose, Bld: 99 mg/dL (ref 65–99)
Potassium: 3.9 mmol/L (ref 3.5–5.3)
Sodium: 142 mmol/L (ref 135–146)
Total Bilirubin: 0.5 mg/dL (ref 0.2–1.2)
Total Protein: 7.1 g/dL (ref 6.1–8.1)
eGFR: 105 mL/min/{1.73_m2} (ref >=60)

## 2022-12-03 NOTE — Progress Notes (Signed)
Rupnow, [name] ([id number]) [id number] Page 1 of 7 Visit Report for 11/25/2022 Arrival Information Details Patient Name: Date of Service: [name] 11/25/2022 7:30 A M Medical Record Number: [id number] Patient Account Number: [id number] Date of Birth/Sex: Treating RN: [birth year] (47 y.o. Valma Cava Primary Care Brodi Kari: [name] Other Clinician: Referring Sadye Kiernan: Treating Sequoya Hogsett/Extender: Horton Marshall in Treatment: 52 Visit Information History Since Last Visit Added or deleted any medications: No Patient Arrived: Ambulatory Any new allergies or adverse reactions: No Arrival Time: 07:39 Signs or symptoms of abuse/neglect since last visito No Accompanied By: translator Hospitalized since last visit: No Transfer Assistance: None Implantable device outside of the clinic excluding No Patient Identification Verified: Yes cellular tissue based products placed in the center Secondary Verification Process Completed: Yes since last visit: Patient Requires Transmission-Based Precautions: No Has Compression in Place as Prescribed: Yes Patient Has Alerts: No Pain Present Now: No Electronic Signature(s) Signed: 12/02/2022 2:05:17 PM By: Tommie Ard RN Entered By: Tommie Ard on 11/25/2022 07:40:09 -------------------------------------------------------------------------------- Compression Therapy Details Patient Name: Date of Service: [name] 11/25/2022 7:30 A M Medical Record Number: [id number] Patient Account Number: [id number] Date of Birth/Sex: Treating RN: June 08, 1976 (47 y.o. Valma Cava Primary Care Eriana Suliman: [name] Other Clinician: Referring Anya Murphey: Treating Ordean Fouts/Extender: Horton Marshall in Treatment: 3 Compression Therapy Performed for Wound Assessment: Wound #1 Left Ankle Performed By: Clinician Tommie Ard, RN Compression Type: Three  Layer Post Procedure Diagnosis Same as Pre-procedure Notes urgo lite Electronic Signature(s) Signed: 12/02/2022 2:05:17 PM By: Tommie Ard RN Entered By: Tommie Ard on 11/25/2022 08:35:33 -------------------------------------------------------------------------------- Encounter Discharge Information Details Patient Name: Date of Service: [name] 11/25/2022 7:30 A M Medical Record Number: [id number] Patient Account Number: [id number] Date of Birth/Sex: Treating RN: 11/27/1975 (47 y.o. Valma Cava Primary Care Efstathios Sawin: [name] Other Clinician: Referring Savannah Erbe: Treating Shervin Cypert/Extender: Horton Marshall in Treatment: 61 Encounter Discharge Information Items Post Procedure Vitals Discharge Condition: Stable Temperature (F): 97.7 Ambulatory Status: Ambulatory Pulse (bpm): 60 Discharge Destination: Home Respiratory Rate (breaths/min): 17 Transportation: Private Auto Blood Pressure (mmHg): 133/84 Accompanied By: self Commisso, [name] ([id number]) [id number] Page 2 of 7 Schedule Follow-up Appointment: Yes Clinical Summary of Care: Electronic Signature(s) Signed: 12/02/2022 2:05:17 PM By: Tommie Ard RN Entered By: Tommie Ard on 11/25/2022 07:56:20 -------------------------------------------------------------------------------- Lower Extremity Assessment Details Patient Name: Date of Service: [name] 11/25/2022 7:30 A M Medical Record Number: [id number] Patient Account Number: [id number] Date of Birth/Sex: Treating RN: Feb 01, 1976 (47 y.o. Valma Cava Primary Care Sharne Linders: [name] Other Clinician: Referring Monserratt Knezevic: Treating Demoni Gergen/Extender: Horton Marshall in Treatment: 94 Edema Assessment Assessed: [Left: No] [Right: No] Edema: [Left: Ye] [Right: s] Calf Left: Right: Point of Measurement: 28 cm From Medial Instep 29 cm Ankle Left:  Right: Point of Measurement: 8 cm From Medial Instep 21.5 cm Vascular Assessment Pulses: Dorsalis Pedis Palpable: [Left:Yes] Electronic Signature(s) Signed: 12/02/2022 2:05:17 PM By: Tommie Ard RN Entered By: Tommie Ard on 11/25/2022 07:45:30 -------------------------------------------------------------------------------- Multi Wound Chart Details Patient Name: Date of Service: [name] 11/25/2022 7:30 A M Medical Record Number: [id number] Patient Account Number: [id number] Date of Birth/Sex: Treating RN: 09-01-1975 (47 y.o. M) Primary Care Sela Falk: [name] Other Clinician: Referring Cheyann Blecha: Treating Akhil Piscopo/Extender: Horton Marshall in Treatment: 68 Vital Signs Height(in): 69 Pulse(bpm): 60 Weight(lbs): 170 Blood Pressure(mmHg): 133/84 Body Mass Index(BMI): 25.1 Temperature(F): 97.7 Respiratory Rate(breaths/min):  17 [1:Photos:] [N/A:N/A] Left Ankle N/A N/A Wound Location: Trauma N/A N/A Wounding Event: Venous Leg Ulcer N/A N/A Primary Etiology: 10/14/2020 N/A N/A Date Acquired: 72 N/A N/A Weeks of Treatment: Open N/A N/A Wound Status: No N/A N/A Wound Recurrence: 0.9x1.3x0.2 N/A N/A Measurements L x W x D (cm) 0.919 N/A N/A A (cm) : rea 0.184 N/A N/A Volume (cm) : 98.50% N/A N/A % Reduction in A rea: 99.30% N/A N/A % Reduction in Volume: Full Thickness Without Exposed N/A N/A Classification: Support Structures Medium N/A N/A Exudate A mount: Serous N/A N/A Exudate Type: amber N/A N/A Exudate Color: Flat and Intact N/A N/A Wound Margin: Large (67-100%) N/A N/A Granulation A mount: Red N/A N/A Granulation Quality: Small (1-33%) N/A N/A Necrotic A mount: Fat Layer (Subcutaneous Tissue): Yes N/A N/A Exposed Structures: Fascia: No Tendon: No Muscle: No Joint: No Bone: No Large (67-100%) N/A N/A Epithelialization: Debridement - Selective/Open Wound N/A N/A Debridement: Pre-procedure  Verification/Time Out 07:52 N/A N/A Taken: Lidocaine 5% topical ointment N/A N/A Pain Control: Slough N/A N/A Tissue Debrided: Non-Viable Tissue N/A N/A Level: 0.92 N/A N/A Debridement A (sq cm): rea Curette N/A N/A Instrument: Minimum N/A N/A Bleeding: Pressure N/A N/A Hemostasis A chieved: Procedure was tolerated well N/A N/A Debridement Treatment Response: 0.9x1.3x0.2 N/A N/A Post Debridement Measurements L x W x D (cm) 0.184 N/A N/A Post Debridement Volume: (cm) Scarring: Yes N/A N/A Periwound Skin Texture: Maceration: No N/A N/A Periwound Skin Moisture: Dry/Scaly: No No Abnormalities Noted N/A N/A Periwound Skin Color: No Abnormality N/A N/A Temperature: Debridement N/A N/A Procedures Performed: Treatment Notes Wound #1 (Ankle) Wound Laterality: Left Cleanser Soap and Water Discharge Instruction: May shower and wash wound with dial antibacterial soap and water prior to dressing change. Vashe 5.8 (oz) Discharge Instruction: Cleanse the wound with Vashe prior to applying a clean dressing using gauze sponges, not tissue or cotton balls. Peri-Wound Care Sween Lotion (Moisturizing lotion) Discharge Instruction: Apply moisturizing lotion as directed Topical Gentamicin Discharge Instruction: As directed by physician Primary Dressing Endoform 2x2 in Discharge Instruction: Moisten with saline Secondary Dressing Drawtex 4x4 in Discharge Instruction: Apply over primary dressing as directed. Woven Gauze Sponge, Non-Sterile 4x4 in Discharge Instruction: Apply over primary dressing as directed. Secured With Keizer, [name] ([id number]) [id number] Page 4 of 7 Compression Wrap Urgo K2 Lite, two layer compression system, regular Discharge Instruction: Apply Urgo K2 Lite as directed (alternative to 3 layer compression). Compression Stockings Add-Ons Electronic Signature(s) Signed: 11/25/2022 8:12:12 AM By: [name]  FACS Entered By: Duanne Guess on 11/25/2022 08:12:12 -------------------------------------------------------------------------------- Multi-Disciplinary Care Plan Details Patient Name: Date of Service: [name] 11/25/2022 7:30 A M Medical Record Number: [id number] Patient Account Number: [id number] Date of Birth/Sex: Treating RN: 1976/03/10 (47 y.o. Valma Cava Primary Care Macy Lingenfelter: [name] Other Clinician: Referring Elna Radovich: Treating Deboraha Goar/Extender: Horton Marshall in Treatment: 64 Multidisciplinary Care Plan reviewed with physician Active Inactive Venous Leg Ulcer Nursing Diagnoses: Actual venous Insuffiency (use after diagnosis is confirmed) Knowledge deficit related to disease process and management Goals: Patient will maintain optimal edema control Date Initiated: 02/19/2022 Target Resolution Date: 11/26/2022 Goal Status: Active Interventions: Assess peripheral edema status every visit. Compression as ordered Treatment Activities: Therapeutic compression applied : 02/19/2022 Notes: Wound/Skin Impairment Nursing Diagnoses: Knowledge deficit related to ulceration/compromised skin integrity Goals: Patient/caregiver will verbalize understanding of skin care regimen Date Initiated: 02/05/2021 Target Resolution Date: 11/26/2022 Goal Status: Active Interventions: Assess patient/caregiver ability to obtain necessary supplies Assess patient/caregiver ability  to perform ulcer/skin care regimen upon admission and as needed Provide education on ulcer and skin care Treatment Activities: Skin care regimen initiated : 02/05/2021 Topical wound management initiated : 02/05/2021 Notes: 03/31/21: Wound care regimen ongoing, target date extended. 04/21/21: Wound care ongoing, through interpreter patient states he is doing fine with his dressing changes. Electronic Signature(s) Signed: 12/02/2022 2:05:17 PM By: Tommie Ard RN Demauro,  [name] ([id number]) Tommie Ard RN [phone].pdf Page 5 of 7 Signed: 12/02/2022 2:05:17 PM By: Margaret Pyle By: Tommie Ard on 11/25/2022 07:49:11 -------------------------------------------------------------------------------- Pain Assessment Details Patient Name: Date of Service: [name] 11/25/2022 7:30 A M Medical Record Number: [id number] Patient Account Number: [id number] Date of Birth/Sex: Treating RN: 10/24/1975 (47 y.o. Valma Cava Primary Care Fahim Kats: [name] Other Clinician: Referring Agatha Duplechain: Treating Williard Keller/Extender: Horton Marshall in Treatment: 54 Active Problems Location of Pain Severity and Description of Pain Patient Has Paino No Site Locations Pain Management and Medication Current Pain Management: Electronic Signature(s) Signed: 12/02/2022 2:05:17 PM By: Tommie Ard RN Entered By: Tommie Ard on 11/25/2022 07:41:17 -------------------------------------------------------------------------------- Patient/Caregiver Education Details Patient Name: Date of Service: [name] 4/25/2024andnbsp7:30 A M Medical Record Number: [id number] Patient Account Number: [id number] Date of Birth/Gender: Treating RN: Mar 19, 1976 (47 y.o. Valma Cava Primary Care Physician: [name] Other Clinician: Referring Physician: Treating Physician/Extender: Horton Marshall in Treatment: 25 Education Assessment Education Provided To: Patient Education Topics Provided Wound Debridement: Methods: Explain/Verbal Responses: Reinforcements needed, State content correctly Wound/Skin Impairment: Methods: Explain/Verbal Responses: Reinforcements needed, State content correctly Pancoast, [name] ([id number]) [phone].pdf Page 6 of 7 Electronic Signature(s) Signed: 12/02/2022 2:05:17 PM By: Tommie Ard RN Entered By: Tommie Ard on 11/25/2022  07:49:30 -------------------------------------------------------------------------------- Wound Assessment Details Patient Name: Date of Service: [name] 11/25/2022 7:30 A M Medical Record Number: [id number] Patient Account Number: [id number] Date of Birth/Sex: Treating RN: June 05, 1976 (47 y.o. Valma Cava Primary Care Benecio Kluger: [name] Other Clinician: Referring Rosemarie Galvis: Treating Breawna Montenegro/Extender: Horton Marshall in Treatment: 28 Wound Status Wound Number: 1 Primary Etiology: Venous Leg Ulcer Wound Location: Left Ankle Wound Status: Open Wounding Event: Trauma Date Acquired: 10/14/2020 Weeks Of Treatment: 94 Clustered Wound: No Photos Wound Measurements Length: (cm) 0.9 Width: (cm) 1.3 Depth: (cm) 0.2 Area: (cm) 0.919 Volume: (cm) 0.184 % Reduction in Area: 98.5% % Reduction in Volume: 99.3% Epithelialization: Large (67-100%) Tunneling: No Undermining: No Wound Description Classification: Full Thickness Without Exposed Suppor Wound Margin: Flat and Intact Exudate Amount: Medium Exudate Type: Serous Exudate Color: amber t Structures Foul Odor After Cleansing: No Slough/Fibrino Yes Wound Bed Granulation Amount: Large (67-100%) Exposed Structure Granulation Quality: Red Fascia Exposed: No Necrotic Amount: Small (1-33%) Fat Layer (Subcutaneous Tissue) Exposed: Yes Necrotic Quality: Adherent Slough Tendon Exposed: No Muscle Exposed: No Joint Exposed: No Bone Exposed: No Periwound Skin Texture Texture Color No Abnormalities Noted: No No Abnormalities Noted: Yes Scarring: Yes Temperature / Pain Temperature: No Abnormality Moisture No Abnormalities Noted: Yes Electronic Signature(s) Signed: 12/02/2022 2:05:17 PM By: Tommie Ard RN Entered By: Tommie Ard on 11/25/2022 08:04:22 Mayorga, [name] ([id number]) [id number] Page 7 of  7 -------------------------------------------------------------------------------- Vitals Details Patient Name: Date of Service: [name] 11/25/2022 7:30 A M Medical Record Number: [id number] Patient Account Number: [id number] Date of Birth/Sex: Treating RN: 1975/12/31 (47 y.o. Valma Cava Primary Care Clair Bardwell: [name] Other Clinician: Referring Patrik Turnbaugh: Treating Ghazi Rumpf/Extender: Horton Marshall in Treatment: 55 Vital Signs Time Taken: 07:40  Temperature (F): 97.7 Height (in): 69 Pulse (bpm): 60 Weight (lbs): 170 Respiratory Rate (breaths/min): 17 Body Mass Index (BMI): 25.1 Blood Pressure (mmHg): 133/84 Reference Range: 80 - 120 mg / dl Electronic Signature(s) Signed: 12/02/2022 2:05:17 PM By: Tommie Ard RN Entered By: Tommie Ard on 11/25/2022 07:41:11

## 2022-12-03 NOTE — Progress Notes (Signed)
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 06/21/2022: The patient was absent from clinic the past couple of weeks. He returns today and the wound seems to have deteriorated somewhat. Through his interpreter, he reports that at his job, he stands basically immobile for prolonged periods  of time and his feet and legs are constantly wet, meaning the wound is wet throughout his work shifts. He is unable to get a waterproof boot over his leg due to the inflexibility of his ankle. 06/29/2022: The wound is about the same size, but it is quite clean and the surface has more of a pink color. His  employment has told him that they cannot make any further accommodations for him. 07/06/2022: The wound is smaller this week. There is a light layer of slough on the surface, but the drainage on his dressing has the typical blue-green color of Pseudomonas. 12/12; this is a patient to be admitted earlier in the year with [name] extensive wound across the left lateral ankle and into the anterior ankle. Initially [name] immigrant from the [place]. He speaks [place] leads and we interviewed him through [name] interpreter. He has been using endoform on the remanent of the wound. Intake nurse tells [place] that we have healed this out but it reopens. He is no longer working 07/21/2022: The wound is smaller today. There is slough on the surface, but good granulation tissue underlying. 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 08/06/2022: The wound is a little bit smaller again today. There is thick slough on the surface, but the surface underneath is less fibrotic. 08/31/2022: The patient has missed a couple of appointments. T oday, his foot and leg are completely macerated. He says that he has been using a plastic bag for showers and that it leaked. The wound is larger, has a thick layer of slough on the surface and is malodorous. 09/07/2022: The wound looks better this week. It is smaller with some slough on the surface. A small satellite wound has opened up just proximal to the main site. 09/15/2022: The wound has a lot more slough on it this week and the satellite wound is larger. He says that he has been standing quite a bit at work. 09/22/2022: The satellite lesion is about the same size but much more superficial. The main wound is a also about the same size, but has a healthier-looking surface. There is slough accumulation on both surfaces. 09/29/2022: The main wound is smaller. The satellite lesion is about the same size. Both have slough on the surface. 10/06/2022: Both wounds are smaller. There is slough and  eschar buildup in both sites. 3/13; patient presents for follow-up. We have been using antibiotic ointment with endoform under 3 layer compression. Patient has no issues or complaints today. 10/21/2022: The wound is smaller today. Both the main wound and satellite lesion are about a third smaller than the last time I saw them. There is some slough on both surfaces. 10/29/2022: The satellite lesion has closed. The main wound is smaller again today with just some slough and eschar present. 11/05/2022: The satellite lesion unfortunately reopened, but it is quite small and superficial under some eschar. The main wound continues to contract. There is slough on the surface, but epithelium is creeping down the sides of the divot. 11/12/2022: The satellite lesion has closed again. The main wound is about the same size, but the surface appearance is more pink and viable. 11/18/2022: The wound is a little smaller today. There is slough on the surface. 11/25/2022: The wound continues to contract. It appears to be epithelializing down the sides of the divot. There is slough on the surface. Patient History Information obtained from Patient. Family History Unknown History. Social History Current every day smoker, Marital Status -  Married, Alcohol Use - Rarely, Drug Use - No History, Caffeine Use - Moderate. Medical A Surgical History Notes nd Gastrointestinal Febres, [name] ([id number]) [id number] Page 10 of 12 Chronic Gastritis Objective Constitutional no acute distress. Vitals Time Taken: 7:40 AM, Height: 69 in, Weight: 170 lbs, BMI: 25.1, Temperature: 97.7 F, Pulse: 60 bpm, Respiratory Rate: 17 breaths/min, Blood Pressure: 133/84 mmHg. Respiratory Normal work of breathing on room air. General Notes: 11/25/2022: The wound continues to contract. It appears to be epithelializing down the sides of the divot. There is slough on the surface. Integumentary (Hair, Skin) Wound  #1 status is Open. Original cause of wound was Trauma. The date acquired was: 10/14/2020. The wound has been in treatment 94 weeks. The wound is located on the Left Ankle. The wound measures 0.9cm length x 1.3cm width x 0.2cm depth; 0.919cm^2 area and 0.184cm^3 volume. There is Fat Layer (Subcutaneous Tissue) exposed. There is no tunneling or undermining noted. There is a medium amount of serous drainage noted. The wound margin is flat and intact. There is large (67-100%) red granulation within the wound bed. There is a small (1-33%) amount of necrotic tissue within the wound bed including Adherent Slough. The periwound skin appearance had no abnormalities noted for moisture. The periwound skin appearance had no abnormalities noted for color. The periwound skin appearance exhibited: Scarring. Periwound temperature was noted as No Abnormality. 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 There was a Selective/Open Wound Non-Viable Tissue Debridement with a total area of 0.92 sq cm performed by [name] Guess, MD. With the following instrument(s): Curette to remove Non-Viable tissue/material. Material removed includes [facility] after achieving pain control using Lidocaine 5% topical ointment. No specimens were taken. A time out was conducted at 07:52, prior to the start of the procedure. A Minimum amount of bleeding was controlled with Pressure. The procedure was tolerated well. Post Debridement Measurements: 0.9cm length x 1.3cm width x 0.2cm depth; 0.184cm^3 volume. Character of Wound/Ulcer Post Debridement requires further debridement. Severity of Tissue Post Debridement is: Fat layer  exposed. Post procedure Diagnosis Wound #1: Same as Pre-Procedure General Notes: Scribed for Dr. Lady [name] by [name] Ard, [name]. 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 There was a Three Layer Compression Therapy Procedure by [name] Ard, [name]. Post procedure Diagnosis Wound #1: Same as Pre-Procedure Notes: urgo lite. Plan Follow-up Appointments: Return Appointment in 1 week. - Dr. Lady [name] - Room 4 Other: - [place] interpreter required. Anesthetic: (In clinic) Topical Lidocaine 4% applied to wound bed Bathing/ Shower/ Hygiene: Other Bathing/Shower/Hygiene Orders/Instructions: - Please use a " CAST PROTECTOR" when showering. This can be purchased from [employer], Medical supply store,[employer]. Cost is approximately $14-27. Edema Control - Lymphedema / SCD / Other: Elevate legs to the level of the heart or above for 30 minutes daily and/or when sitting for 3-4 times a day throughout the day. Avoid standing for long periods of time. Exercise regularly Off-Loading: Open toe surgical shoe to: - left foot [name], [name] ([id number]) [id number] Page 11 of 12 WOUND #1: - Ankle Wound Laterality: Left Cleanser: Soap and Water 1 x Per Week/ Discharge Instructions: May shower and wash wound with dial antibacterial soap and water prior to dressing  change. Cleanser: Vashe 5.8 (oz) 1 x Per Week/ Discharge Instructions: Cleanse the wound with Vashe prior to applying a clean dressing using gauze sponges, not tissue or cotton balls. Peri-Wound Care: Sween Lotion (Moisturizing lotion) 1 x Per Week/ Discharge Instructions: Apply moisturizing lotion as directed Topical: Gentamicin 1 x Per Week/ Discharge Instructions: As directed by physician Prim Dressing: Endoform 2x2 in 1 x Per Week/ ary Discharge Instructions: Moisten with saline Secondary Dressing: Drawtex 4x4 in 1 x Per Week/ Discharge Instructions: Apply over primary  dressing as directed. Secondary Dressing: Woven Gauze Sponge, Non-Sterile 4x4 in 1 x Per Week/ Discharge Instructions: Apply over primary dressing as directed. Com pression Wrap: Urgo K2 Lite, two layer compression system, regular 1 x Per Week/ Discharge Instructions: Apply Urgo K2 Lite as directed (alternative to 3 layer compression). 11/25/2022: The wound continues to contract. It appears to be epithelializing down the sides of the divot. There is slough on the surface. I used a curette to debride slough from the wound. We will continue topical gentamicin with endoform and drawtex under 3 layer compression. He will follow-up in 1 week. Electronic Signature(s) Signed: 11/25/2022 4:51:53 PM By: Shawn Stall [name], BSN Signed: 11/26/2022 7:52:44 AM By: [name] Guess MD [name] Previous Signature: 11/25/2022 8:14:33 AM Version By: [name] Guess MD [name] Entered By: Shawn Stall on 11/25/2022 16:46:55 -------------------------------------------------------------------------------- HxROS Details Patient Name: Date of Service: [name] [name] NA [name] 11/25/2022 7:30 A M Medical Record Number: [id number] Patient Account Number: [id number] Date of Birth/Sex: Treating [name]: Nov 16, 1975 (47 y.o. M) Primary Care Provider: Richarda [name] Other Clinician: Referring Provider: Treating Provider/Extender: [name] in Treatment: 67 Information Obtained From Patient Gastrointestinal Medical History: Past Medical History Notes: Chronic Gastritis Immunizations Pneumococcal Vaccine: Received Pneumococcal Vaccination: No Implantable Devices No devices added Family and Social History Unknown History: Yes; Current every day smoker; Marital Status - Married; Alcohol Use: Rarely; Drug Use: No History; Caffeine Use: Moderate; Financial Concerns: No; Food, Clothing or Shelter Needs: No; Support System Lacking: No; Transportation Concerns: No Electronic Signature(s) Signed: 11/25/2022  4:24:20 PM By: [name] Guess MD [name] Entered By: [name] on 11/25/2022 08:13:11 -------------------------------------------------------------------------------- SuperBill Details Patient Name: Date of Service: [name] [name] NA [name] 11/25/2022 Medical Record Number: [id number] Patient Account Number: [id number] Wos, [name] ([id number]) [id number] Page 12 of 12 Date of Birth/Sex: Treating [name]: 1975-10-24 (47 y.o. M) Primary Care Provider: Richarda [name] Other Clinician: Referring Provider: Treating Provider/Extender: [name] in Treatment: 94 Diagnosis Coding ICD-10 Codes Code Description [id number] Non-pressure chronic ulcer of left ankle with other specified severity I87.332 Chronic venous hypertension (idiopathic) with ulcer and inflammation of left lower extremity Facility Procedures : CPT4 Code: [id number] Description: 97597 - DEBRIDE WOUND 1ST 20 SQ CM OR < ICD-10 Diagnosis Description L97.328 Non-pressure chronic ulcer of left ankle with other specified severity Modifier: Quantity: 1 Physician Procedures : CPT4 Code Description Modifier [id number] - WC PHYS LEVEL 4 - EST PT 25 ICD-10 Diagnosis Description L97.328 Non-pressure chronic ulcer of left ankle with other specified severity I87.332 Chronic venous hypertension (idiopathic) with ulcer and  inflammation of left lower extremity Quantity: 1 : [id number] 97597 - WC PHYS DEBR WO ANESTH 20 SQ CM ICD-10 Diagnosis Description L97.328 Non-pressure chronic ulcer of left ankle with other specified severity Quantity: 1 Electronic Signature(s) Signed: 11/25/2022 8:14:47 AM By: [name] Guess MD [name] Entered By: [name] on 11/25/2022 08:14:47

## 2022-12-03 NOTE — Progress Notes (Signed)
This encounter was created in error - please disregard.

## 2022-12-03 NOTE — Progress Notes (Signed)
[name] ([id number]) [id number] Page 1 of 7 Visit Report for 12/02/2022 Arrival Information Details Patient Name: Date of Service: [name] [place] NIE 12/02/2022 9:15 A M Medical Record Number: [id number] Patient Account Number: [id number] Date of Birth/Sex: Treating RN: 07/23/1976 (47 y.o. M) Primary Care [name]: [name] Other Clinician: Referring [name]: Treating [name]/Extender: [name] in Treatment: 95 Visit Information History Since Last Visit All ordered tests and consults were completed: No Patient Arrived: Ambulatory Added or deleted any medications: No Arrival Time: 09:04 Any new allergies or adverse reactions: No Accompanied By: self Had a fall or experienced change in No Transfer Assistance: None activities of daily living that may affect Patient Identification Verified: Yes risk of falls: Secondary Verification Process Completed: Yes Signs or symptoms of abuse/neglect since last visito No Patient Requires Transmission-Based Precautions: No Hospitalized since last visit: No Patient Has Alerts: No Implantable device outside of the clinic excluding No cellular tissue based products placed in the center since last visit: Pain Present Now: No Electronic Signature(s) Signed: 12/02/2022 2:59:12 PM By: [name] Entered By: [name] on 12/02/2022 09:05:07 -------------------------------------------------------------------------------- Compression Therapy Details Patient Name: Date of Service: [name] 12/02/2022 9:15 A M Medical Record Number: [id number] Patient Account Number: [id number] Date of Birth/Sex: Treating RN: 1976-02-26 (47 y.o. Valma Cava Primary Care [name]: [name] Other Clinician: Referring [name]: Treating [name]/Extender: [name] in Treatment: 95 Compression Therapy Performed for Wound Assessment: Wound #1 Left  Ankle Performed By: Clinician [name] Ard, RN Compression Type: Three Layer Post Procedure Diagnosis Same as Pre-procedure Notes Urgo lite Electronic Signature(s) Signed: 12/02/2022 2:04:38 PM By: [name] Ard RN Entered By: [name] on 12/02/2022 09:36:37 -------------------------------------------------------------------------------- Encounter Discharge Information Details Patient Name: Date of Service: [name] 12/02/2022 9:15 A M Medical Record Number: [id number] Patient Account Number: [id number] Date of Birth/Sex: Treating RN: 06-Feb-1976 (47 y.o. Valma Cava Primary Care [name]: [name] Other Clinician: Referring Ketara Cavness: Treating [name] in Treatment: [phone] Encounter Discharge Information Items Post Procedure Vitals Discharge Condition: Stable Temperature (F): 98.7 Ambulatory Status: Ambulatory Pulse (bpm): 72 [name], [name] ([id number]) [phone].pdf Page 2 of 7 Discharge Destination: Home Respiratory Rate (breaths/min): 18 Transportation: Private Auto Blood Pressure (mmHg): 134/84 Accompanied By: self Schedule Follow-up Appointment: Yes Clinical Summary of Care: Electronic Signature(s) Signed: 12/02/2022 2:04:38 PM By: [name] Ard RN Entered By: [name] on 12/02/2022 09:51:56 -------------------------------------------------------------------------------- Lower Extremity Assessment Details Patient Name: Date of Service: [name] 12/02/2022 9:15 A M Medical Record Number: [id number] Patient Account Number: [id number] Date of Birth/Sex: Treating RN: 09-26-1975 (47 y.o. Valma Cava Primary Care [name]: [name] Other Clinician: Referring [name]: Treating Kymia Simi/Extender: [name] in Treatment: 95 Edema Assessment Assessed: [Left: No] [Right: No] Edema: [Left: Ye] [Right: s] Calf Left:  Right: Point of Measurement: 28 cm From Medial Instep 29 cm Ankle Left: Right: Point of Measurement: 8 cm From Medial Instep 21 cm Vascular Assessment Pulses: Dorsalis Pedis Palpable: [Left:Yes] Electronic Signature(s) Signed: 12/02/2022 2:04:38 PM By: [name] Ard RN Entered By: [name] on 12/02/2022 09:28:48 -------------------------------------------------------------------------------- Multi Wound Chart Details Patient Name: Date of Service: [name] 12/02/2022 9:15 A M Medical Record Number: [id number] Patient Account Number: [id number] Date of Birth/Sex: Treating RN: 1975-10-08 (47 y.o. M) Primary Care [name]: [name] Other Clinician: Referring [name]: Treating [name]/Extender: [name] in Treatment: 95 Vital Signs Height(in):  69 Pulse(bpm): 72 Weight(lbs): 170 Blood Pressure(mmHg): 133/84 Body Mass Index(BMI): 25.1 Temperature(F): 98.7 Respiratory Rate(breaths/min): 20 [1:Photos:] [N/A:N/A] Left Ankle N/A N/A Wound Location: Trauma N/A N/A Wounding Event: Venous Leg Ulcer N/A N/A Primary Etiology: 10/14/2020 N/A N/A Date Acquired: 95 N/A N/A Weeks of Treatment: Open N/A N/A Wound Status: No N/A N/A Wound Recurrence: 0.8x1.4x0.2 N/A N/A Measurements L x W x D (cm) 0.88 N/A N/A A (cm) : rea 0.176 N/A N/A Volume (cm) : 98.60% N/A N/A % Reduction in A rea: 99.30% N/A N/A % Reduction in Volume: Full Thickness Without Exposed N/A N/A Classification: Support Structures Medium N/A N/A Exudate A mount: Serous N/A N/A Exudate Type: amber N/A N/A Exudate Color: Flat and Intact N/A N/A Wound Margin: Large (67-100%) N/A N/A Granulation A mount: Red N/A N/A Granulation Quality: Small (1-33%) N/A N/A Necrotic A mount: Fat Layer (Subcutaneous Tissue): Yes N/A N/A Exposed Structures: Fascia: No Tendon: No Muscle: No Joint: No Bone: No Large (67-100%) N/A  N/A Epithelialization: Debridement - Selective/Open Wound N/A N/A Debridement: Pre-procedure Verification/Time Out 09:32 N/A N/A Taken: Lidocaine 5% topical ointment N/A N/A Pain Control: Slough N/A N/A Tissue Debrided: Non-Viable Tissue N/A N/A Level: 0.88 N/A N/A Debridement A (sq cm): rea Curette N/A N/A Instrument: Minimum N/A N/A Bleeding: Pressure N/A N/A Hemostasis A chieved: Procedure was tolerated well N/A N/A Debridement Treatment Response: 0.8x1.4x0.2 N/A N/A Post Debridement Measurements L x W x D (cm) 0.176 N/A N/A Post Debridement Volume: (cm) Scarring: Yes N/A N/A Periwound Skin Texture: Maceration: No N/A N/A Periwound Skin Moisture: Dry/Scaly: No No Abnormalities Noted N/A N/A Periwound Skin Color: No Abnormality N/A N/A Temperature: Debridement N/A N/A Procedures Performed: Treatment Notes Electronic Signature(s) Signed: 12/02/2022 9:35:32 AM By: [name] FACS Entered By: Duanne Guess on 12/02/2022 09:35:31 -------------------------------------------------------------------------------- Multi-Disciplinary Care Plan Details Patient Name: Date of Service: [name] 12/02/2022 9:15 A M Medical Record Number: [id number] Patient Account Number: [id number] Date of Birth/Sex: Treating RN: Feb 15, 1976 (47 y.o. Valma Cava Primary Care Tzipporah Nagorski: [name] Other Clinician: Referring Brindle Leyba: Treating Valentina Alcoser/Extender: [name] in Treatment: 95 Multidisciplinary Care Plan reviewed with physician Active Inactive Venous Leg Ulcer Nursing Diagnoses: [name], [name] ([id number]) [id number] Page 4 of 7 Actual venous Insuffiency (use after diagnosis is confirmed) Knowledge deficit related to disease process and management Goals: Patient will maintain optimal edema control Date Initiated: 02/19/2022 Target Resolution Date: 12/29/2022 Goal Status:  Active Interventions: Assess peripheral edema status every visit. Compression as ordered Treatment Activities: Therapeutic compression applied : 02/19/2022 Notes: Wound/Skin Impairment Nursing Diagnoses: Knowledge deficit related to ulceration/compromised skin integrity Goals: Patient/caregiver will verbalize understanding of skin care regimen Date Initiated: 02/05/2021 Target Resolution Date: 12/29/2022 Goal Status: Active Interventions: Assess patient/caregiver ability to obtain necessary supplies Assess patient/caregiver ability to perform ulcer/skin care regimen upon admission and as needed Provide education on ulcer and skin care Treatment Activities: Skin care regimen initiated : 02/05/2021 Topical wound management initiated : 02/05/2021 Notes: 03/31/21: Wound care regimen ongoing, target date extended. 04/21/21: Wound care ongoing, through interpreter patient states he is doing fine with his dressing changes. Electronic Signature(s) Signed: 12/02/2022 2:04:38 PM By: [name] Ard RN Entered By: [name] on 12/02/2022 09:50:31 -------------------------------------------------------------------------------- Pain Assessment Details Patient Name: Date of Service: [name] 12/02/2022 9:15 A M Medical Record Number: [id number] Patient Account Number: [id number] Date of Birth/Sex: Treating RN: [birth year] (47 y.o. M) Primary Care Drew Lips: [name] Other Clinician: Referring Brittiny Levitz: Treating Amri Lien/Extender: Duanne Guess  Dannielle Huh in Treatment: 95 Active Problems Location of Pain Severity and Description of Pain Patient Has Paino No Site Locations [name], [name] ([id number]) [id number] Page 5 of 7 Pain Management and Medication Current Pain Management: Electronic Signature(s) Signed: 12/02/2022 2:59:12 PM By: [name] Entered By: [name] on 12/02/2022  09:05:32 -------------------------------------------------------------------------------- Patient/Caregiver Education Details Patient Name: Date of Service: [name] 5/2/2024andnbsp9:15 A M Medical Record Number: [id number] Patient Account Number: [id number] Date of Birth/Gender: Treating RN: 03/26/1976 (47 y.o. Valma Cava Primary Care Physician: [name] Other Clinician: Referring Physician: Treating Physician/Extender: [name] in Treatment: [phone] Education Assessment Education Provided To: Patient Education Topics Provided Wound Debridement: Methods: Explain/Verbal Responses: Refused information, State content correctly Wound/Skin Impairment: Methods: Explain/Verbal Responses: Reinforcements needed, State content correctly Electronic Signature(s) Signed: 12/02/2022 2:04:38 PM By: [name] Ard RN Entered By: [name] on 12/02/2022 09:50:48 -------------------------------------------------------------------------------- Wound Assessment Details Patient Name: Date of Service: [name] 12/02/2022 9:15 A M Medical Record Number: [id number] Patient Account Number: [id number] Date of Birth/Sex: Treating RN: [birth year] (47 y.o. Valma Cava Primary Care Satomi Buda: [name] Other Clinician: Referring Maico Mulvehill: Treating Jamyrah Saur/Extender: [name] in Treatment: 95 Wound Status Wound Number: 1 Primary Etiology: Venous Leg Ulcer Wound Location: Left Ankle Wound Status: Open [name], [name] ([id number]) [id number] Page 6 of 7 Wounding Event: Trauma Date Acquired: 10/14/2020 Weeks Of Treatment: 95 Clustered Wound: No Photos Wound Measurements Length: (cm) 0.8 Width: (cm) 1.3 Depth: (cm) 0.2 Area: (cm) 0.817 Volume: (cm) 0.163 % Reduction in Area: 98.7% % Reduction in Volume: 99.3% Epithelialization: Large (67-100%) Wound  Description Classification: Full Thickness Without Exposed Support Structures Wound Margin: Flat and Intact Exudate Amount: Medium Exudate Type: Serous Exudate Color: amber Foul Odor After Cleansing: No Slough/Fibrino Yes Wound Bed Granulation Amount: Large (67-100%) Exposed Structure Granulation Quality: Red Fascia Exposed: No Necrotic Amount: Small (1-33%) Fat Layer (Subcutaneous Tissue) Exposed: Yes Tendon Exposed: No Muscle Exposed: No Joint Exposed: No Bone Exposed: No Periwound Skin Texture Texture Color No Abnormalities Noted: No No Abnormalities Noted: Yes Scarring: Yes Temperature / Pain Temperature: No Abnormality Moisture No Abnormalities Noted: Yes Treatment Notes Wound #1 (Ankle) Wound Laterality: Left Cleanser Soap and Water Discharge Instruction: May shower and wash wound with dial antibacterial soap and water prior to dressing change. Vashe 5.8 (oz) Discharge Instruction: Cleanse the wound with Vashe prior to applying a clean dressing using gauze sponges, not tissue or cotton balls. Peri-Wound Care Sween Lotion (Moisturizing lotion) Discharge Instruction: Apply moisturizing lotion as directed Topical Gentamicin Discharge Instruction: As directed by physician Primary Dressing Endoform 2x2 in Discharge Instruction: Moisten with saline Secondary Dressing [name], [name] ([id number]) [id number] Page 7 of 7 Drawtex 4x4 in Discharge Instruction: Apply over primary dressing as directed. Woven Gauze Sponge, Non-Sterile 4x4 in Discharge Instruction: Apply over primary dressing as directed. Secured With Compression Wrap Urgo K2 Lite, two layer compression system, regular Discharge Instruction: Apply Urgo K2 Lite as directed (alternative to 3 layer compression). Compression Stockings Add-Ons Electronic Signature(s) Signed: 12/02/2022 2:04:38 PM By: [name] Ard RN Entered By: [name] on 12/02/2022  09:36:06 -------------------------------------------------------------------------------- Vitals Details Patient Name: Date of Service: [name] 12/02/2022 9:15 A M Medical Record Number: [id number] Patient Account Number: [id number] Date of Birth/Sex: Treating RN: 07-Dec-1975 (47 y.o. M) Primary Care Darlis Wragg: [name] Other Clinician: Referring Telly Jawad: Treating Lenardo Westwood/Extender: [name] in Treatment: 95 Vital Signs Time Taken: 09:05 Temperature (F): 98.7 Height (  in): 69 Pulse (bpm): 72 Weight (lbs): 170 Respiratory Rate (breaths/min): 20 Body Mass Index (BMI): 25.1 Blood Pressure (mmHg): 133/84 Reference Range: 80 - 120 mg / dl Electronic Signature(s) Signed: 12/02/2022 2:59:12 PM By: [name] Entered By: [name] on 12/02/2022 09:05:27

## 2022-12-03 NOTE — Progress Notes (Signed)
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 There is slough on the surface with some periwound eschar. 06/01/2022: The wound is a little bit smaller again today and the surface continues to improve.  Still with slough buildup, but no concern for infection. 06/21/2022: The patient was absent from clinic the past couple of weeks. He returns today and the wound seems to have deteriorated somewhat. Through his interpreter, he reports that at his job, he stands basically immobile  for prolonged periods of time and his feet and legs are constantly wet, meaning the wound is wet throughout his work shifts. He is unable to get a waterproof boot over his leg due to the inflexibility of his ankle. 06/29/2022: The wound is about the same size, but it is quite clean and the surface has more of a pink color. His employment has told him that they cannot make any further accommodations for him. 07/06/2022: The wound is smaller this week. There is a light layer of slough on the surface, but the drainage on his dressing has the typical blue-green color of Pseudomonas. 12/12; this is a patient to be admitted earlier in the year with [name] extensive wound across the left lateral ankle and into the anterior ankle. Initially [name] immigrant from the [place]. He speaks [place] leads and we interviewed him through [name] interpreter. He has been using endoform on the remanent of the wound. Intake nurse tells [place] that we have healed this out but it reopens. He is no longer working 07/21/2022: The wound is smaller today. There is slough on the surface, but good granulation tissue underlying. 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 08/06/2022: The wound is a little bit smaller again today. There is thick slough on the surface, but the surface underneath is less fibrotic. 08/31/2022: The patient has missed a couple of appointments. T oday, his foot and leg are completely macerated. He says that he has been using a plastic bag for showers and that it leaked. The wound is larger, has a thick layer of slough on the surface and is malodorous. 09/07/2022: The wound looks better this week. It is smaller with some slough on the surface. A small satellite wound has opened up just proximal to the main site. 09/15/2022: The wound has a lot more slough on it this week and the satellite wound is larger. He says that he has been standing quite a bit at work. 09/22/2022: The satellite lesion is about the same size but  much more superficial. The main wound is a also about the same size, but has a healthier-looking surface. There is slough accumulation on both surfaces. 09/29/2022: The main wound is smaller. The satellite lesion is about the same size. Both have slough on the surface. 10/06/2022: Both wounds are smaller. There is slough and eschar buildup in both sites. 3/13; patient presents for follow-up. We have been using antibiotic ointment with endoform under 3 layer compression. Patient has no issues or complaints today. 10/21/2022: The wound is smaller today. Both the main wound and satellite lesion are about a third smaller than the last time I saw them. There is some slough on both surfaces. 10/29/2022: The satellite lesion has closed. The main wound is smaller again today with just some slough and eschar present. 11/05/2022: The satellite lesion unfortunately reopened, but it is quite small and superficial under some eschar. The main wound continues to contract. There is slough on the surface, but epithelium is creeping down the sides of the divot. 11/12/2022: The satellite lesion has closed again. The main wound is about the same size, but the surface appearance is more pink and viable. 11/18/2022: The wound is a little smaller today.  There is slough on the surface. 11/25/2022: The wound continues to contract. It appears to be epithelializing down the sides of the divot. There is slough on the surface. 12/02/2022: Although the wound measured about the same today, visually it appears smaller. There is slough on the surface and the tissue underneath appears healthier. Patient History Sutliff, [name] ([id number]) [id number] Page 10 of 12 Information obtained from Patient. Family History Unknown History. Social History Current every day smoker, Marital Status - Married, Alcohol Use - Rarely, Drug Use - No History, Caffeine Use - Moderate. Medical A Surgical History  Notes nd Gastrointestinal Chronic Gastritis Objective Constitutional no acute distress. Vitals Time Taken: 9:05 AM, Height: 69 in, Weight: 170 lbs, BMI: 25.1, Temperature: 98.7 F, Pulse: 72 bpm, Respiratory Rate: 20 breaths/min, Blood Pressure: 133/84 mmHg. Respiratory Normal work of breathing on room air. General Notes: 12/02/2022: Although the wound measured about the same today, visually it appears smaller. There is slough on the surface and the tissue underneath appears healthier. Integumentary (Hair, Skin) Wound #1 status is Open. Original cause of wound was Trauma. The date acquired was: 10/14/2020. The wound has been in treatment 95 weeks. The wound is located on the Left Ankle. The wound measures 0.8cm length x 1.3cm width x 0.2cm depth; 0.817cm^2 area and 0.163cm^3 volume. There is Fat Layer (Subcutaneous Tissue) exposed. There is a medium amount of serous drainage noted. The wound margin is flat and intact. There is large (67-100%) red granulation within the wound bed. There is a small (1-33%) amount of necrotic tissue within the wound bed. The periwound skin appearance had no abnormalities noted for moisture. The periwound skin appearance had no abnormalities noted for color. The periwound skin appearance exhibited: Scarring. Periwound temperature was noted as No Abnormality. 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 There was a Selective/Open Wound Non-Viable Tissue Debridement with a total area of 0.82 sq cm performed by [name] Guess, MD. With the following instrument(s): Curette to remove Non-Viable tissue/material. Material removed includes [facility] after  achieving pain control using Lidocaine 5% topical ointment. No specimens were taken. A time out was conducted at 09:32, prior to the start of the procedure. A Minimum amount of bleeding was controlled with Pressure. The procedure was tolerated well. Post Debridement Measurements: 0.8cm length x 1.3cm width x 0.2cm depth; 0.163cm^3 volume. Character of Wound/Ulcer Post Debridement requires further debridement. Severity of Tissue Post Debridement is: Fat layer exposed. Post procedure Diagnosis Wound #1: Same as Pre-Procedure General Notes: Scribed for Dr. Lady Gary by [name] Ard, RN. 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 There was a Three Layer Compression Therapy Procedure by [name] Ard, RN. Post procedure Diagnosis Wound #1: Same as Pre-Procedure Notes: Urgo lite. Plan Follow-up Appointments: Return Appointment in 1 week. - Dr. Lady Gary - Room 4 Morison, [name] ([id number]) [phone].pdf Page 11 of 12 Other: - [place] interpreter required. Anesthetic: (In clinic) Topical Lidocaine 4% applied to wound bed Bathing/ Shower/ Hygiene: Other Bathing/Shower/Hygiene Orders/Instructions: - Please use a " CAST PROTECTOR" when showering. This can be purchased from [employer], Medical supply store,[employer]. Cost is approximately $14-27. Edema Control - Lymphedema / SCD / Other: Elevate legs to the level of the heart or  above for 30 minutes daily and/or when sitting for 3-4 times a day throughout the day. Avoid standing for long periods of time. Exercise regularly Off-Loading: Open toe surgical shoe to: - left foot WOUND #1: - Ankle Wound Laterality: Left Cleanser: Soap and Water 1 x Per Week/ Discharge Instructions: May shower and wash wound with dial antibacterial soap and water prior to dressing change. Cleanser: Vashe 5.8 (oz) 1 x Per Week/ Discharge Instructions: Cleanse the wound with Vashe prior to applying a clean  dressing using gauze sponges, not tissue or cotton balls. Peri-Wound Care: Sween Lotion (Moisturizing lotion) 1 x Per Week/ Discharge Instructions: Apply moisturizing lotion as directed Topical: Gentamicin 1 x Per Week/ Discharge Instructions: As directed by physician Prim Dressing: Endoform 2x2 in 1 x Per Week/ ary Discharge Instructions: Moisten with saline Secondary Dressing: Drawtex 4x4 in 1 x Per Week/ Discharge Instructions: Apply over primary dressing as directed. Secondary Dressing: Woven Gauze Sponge, Non-Sterile 4x4 in 1 x Per Week/ Discharge Instructions: Apply over primary dressing as directed. Com pression Wrap: Urgo K2 Lite, two layer compression system, regular 1 x Per Week/ Discharge Instructions: Apply Urgo K2 Lite as directed (alternative to 3 layer compression). 12/02/2022: Although the wound measured about the same today, visually it appears smaller. There is slough on the surface and the tissue underneath appears healthier. I used a curette to debride slough from the wound surface. We will continue topical gentamicin, endoform with drawtex packing, and 3 layer compression. He will follow-up in 1 week. Electronic Signature(s) Signed: 12/02/2022 9:46:13 AM By: [name] Guess MD FACS Entered By: [name] on 12/02/2022 09:46:13 -------------------------------------------------------------------------------- HxROS Details Patient Name: Date of Service: [name] 12/02/2022 9:15 A M Medical Record Number: [id number] Patient Account Number: [id number] Date of Birth/Sex: Treating RN: [birth year] (47 y.o. M) Primary Care Provider: Richarda [name] Other Clinician: Referring Provider: Treating Provider/Extender: [name] in Treatment: 95 Information Obtained From Patient Gastrointestinal Medical History: Past Medical History Notes: Chronic Gastritis Immunizations Pneumococcal Vaccine: Received Pneumococcal Vaccination:  No Implantable Devices No devices added Family and Social History Unknown History: Yes; Current every day smoker; Marital Status - Married; Alcohol Use: Rarely; Drug Use: No History; Caffeine Use: Moderate; Financial Concerns: No; Food, Clothing or Shelter Needs: No; Support System Lacking: No; Transportation Concerns: No Electronic Signature(s) Signed: 12/02/2022 12:51:59 PM By: [name] Guess MD FACS Ocampo, [name] ([id number]) [id number] Page 12 of 12 Entered By: [name] on 12/02/2022 09:36:19 -------------------------------------------------------------------------------- SuperBill Details Patient Name: Date of Service: [name] 12/02/2022 Medical Record Number: [id number] Patient Account Number: [id number] Date of Birth/Sex: Treating RN: [name] (47 y.o. M) Primary Care Provider: Richarda [name] Other Clinician: Referring Provider: Treating Provider/Extender: [name] in Treatment: 95 Diagnosis Coding ICD-10 Codes Code Description [phone] Non-pressure chronic ulcer of left ankle with other specified severity I87.332 Chronic venous hypertension (idiopathic) with ulcer and inflammation of left lower extremity Facility Procedures : CPT4 Code: [id number] Description: 97597 - DEBRIDE WOUND 1ST 20 SQ CM OR < ICD-10 Diagnosis Description L97.328 Non-pressure chronic ulcer of left ankle with other specified severity Modifier: Quantity: 1 Physician Procedures : CPT4 Code Description Modifier [id number] - WC PHYS LEVEL 4 - EST PT 25 ICD-10 Diagnosis Description L97.328 Non-pressure chronic ulcer of left ankle with other specified severity I87.332 Chronic venous hypertension (idiopathic) with ulcer and  inflammation of left lower extremity Quantity: 1 : [id number] 97597 - WC PHYS DEBR WO ANESTH 20 SQ CM ICD-10 Diagnosis  Description L97.328 Non-pressure chronic ulcer of left ankle with other specified  severity Quantity: 1 Electronic Signature(s) Signed: 12/02/2022 9:46:36 AM By: [name] Guess MD FACS Entered By: [name] on 12/02/2022 09:46:36

## 2022-12-07 LAB — H. PYLORI BREATH TEST: H. pylori Breath Test: DETECTED — AB

## 2022-12-09 ENCOUNTER — Encounter (HOSPITAL_BASED_OUTPATIENT_CLINIC_OR_DEPARTMENT_OTHER): Payer: Medicaid Other | Admitting: General Surgery

## 2022-12-09 DIAGNOSIS — I87332 Chronic venous hypertension (idiopathic) with ulcer and inflammation of left lower extremity: Secondary | ICD-10-CM | POA: Diagnosis not present

## 2022-12-09 DIAGNOSIS — I872 Venous insufficiency (chronic) (peripheral): Secondary | ICD-10-CM | POA: Diagnosis not present

## 2022-12-09 DIAGNOSIS — M85872 Other specified disorders of bone density and structure, left ankle and foot: Secondary | ICD-10-CM | POA: Diagnosis not present

## 2022-12-09 DIAGNOSIS — L97322 Non-pressure chronic ulcer of left ankle with fat layer exposed: Secondary | ICD-10-CM | POA: Diagnosis not present

## 2022-12-09 DIAGNOSIS — F17200 Nicotine dependence, unspecified, uncomplicated: Secondary | ICD-10-CM | POA: Diagnosis not present

## 2022-12-09 DIAGNOSIS — L97328 Non-pressure chronic ulcer of left ankle with other specified severity: Secondary | ICD-10-CM | POA: Diagnosis not present

## 2022-12-09 DIAGNOSIS — L97529 Non-pressure chronic ulcer of other part of left foot with unspecified severity: Secondary | ICD-10-CM | POA: Diagnosis not present

## 2022-12-09 NOTE — Progress Notes (Signed)
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 fibrous surface. 04/22/2022: The wound measured slightly smaller today. The surface is much cleaner and has a more robust pinkoored color. It is still fairly fibrotic. 05/17/2022: The patient has not been in clinic for nearly 4 [name]. His wrap has remained in place the entire  time. The wound measures a little bit smaller today. There is slough accumulation. It remains fibrotic. 05/25/2022: The wound surface is improved, with less fibrosis and a more pink color.  There is slough on the surface with some periwound eschar. 06/01/2022: The wound is a little bit smaller again today and the surface continues to improve. Still with slough buildup, but no concern for infection. 06/21/2022: The patient was absent from clinic the past couple of [name]. He returns today and the wound seems to have deteriorated somewhat. Through his interpreter, he reports that at his job, he stands basically immobile for prolonged periods of time and his feet and legs are constantly wet, meaning the wound is wet throughout his work shifts. He is unable to get a waterproof boot over his leg due to the inflexibility of his ankle. 06/29/2022: The wound is about the same size, but it is quite clean and the surface has more of a pink color. His employment has told him that they cannot make any further accommodations for him. 07/06/2022: The wound is smaller this week. There is a light layer of slough on the surface, but the drainage on his dressing has the typical blue-green color of Pseudomonas. 12/12; this is a patient to be admitted earlier in the year with [name] extensive wound across the left lateral ankle and into the anterior ankle. Initially [name] immigrant from the [place]. He speaks [place] leads and we interviewed him through [name] interpreter. He has been using endoform on the remanent of the wound. Intake nurse tells [place] that we have healed this out but it reopens. He is no longer working 07/21/2022: The wound is smaller today. There is slough on the surface, but good granulation tissue underlying. 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 08/06/2022: The wound is a little bit smaller again today. There is thick slough on the surface, but the surface underneath is less fibrotic. 08/31/2022: The patient has missed a couple of appointments. T oday, his foot and leg are completely macerated. He says that he has been using a plastic bag for showers and that it leaked. The wound is  larger, has a thick layer of slough on the surface and is malodorous. 09/07/2022: The wound looks better this week. It is smaller with some slough on the surface. A small satellite wound has opened up just proximal to the main site. 09/15/2022: The wound has a lot more slough on it this week and the satellite wound is larger. He says that he has been standing quite a bit at work. 09/22/2022: The satellite lesion is about the same size but much more superficial. The main wound is a also about the same size, but has a healthier-looking surface. There is slough accumulation on both surfaces. 09/29/2022: The main wound is smaller. The satellite lesion is about the same size. Both have slough on the surface. 10/06/2022: Both wounds are smaller. There is slough and eschar buildup in both sites. 3/13; patient presents for follow-up. We have been using antibiotic ointment with endoform under 3 layer compression. Patient has no issues or complaints today. 10/21/2022: The wound is smaller today. Both the main wound and satellite lesion are about a third smaller than the last time I saw them. There is some slough on both surfaces. 10/29/2022: The satellite lesion has closed. The main wound is smaller again today with just some slough and eschar present. 11/05/2022: The satellite lesion unfortunately reopened, but it is quite small and superficial under  some eschar. The main wound continues to contract. There is slough on the surface, but epithelium is creeping down the sides of the divot. Holleman, [name] ([id number]) [id number] Page 10 of 12 11/12/2022: The satellite lesion has closed again. The main wound is about the same size, but the surface appearance is more pink and viable. 11/18/2022: The wound is a little smaller today. There is slough on the surface. 11/25/2022: The wound continues to contract. It appears to be epithelializing down the sides of the divot. There is slough on the  surface. 12/02/2022: Although the wound measured about the same today, visually it appears smaller. There is slough on the surface and the tissue underneath appears healthier. 12/09/2022: Once again, the wound measurements were not significantly different, but visually it continues to appear smaller each week. There is some slough on the surface but there is epithelium filling in down the edges of the indentation of the wound. Patient History Information obtained from Patient. Family History Unknown History. Social History Current every day smoker, Marital Status - Married, Alcohol Use - Rarely, Drug Use - No History, Caffeine Use - Moderate. Medical A Surgical History Notes nd Gastrointestinal Chronic Gastritis Objective Constitutional no acute distress. Vitals Time Taken: 8:32 AM, Height: 69 in, Weight: 170 lbs, BMI: 25.1, Temperature: 98.3 F, Pulse: 69 bpm, Respiratory Rate: 16 breaths/min, Blood Pressure: 112/76 mmHg. Respiratory Normal work of breathing on room air. General Notes: 12/09/2022: Once again, the wound measurements were not significantly different, but visually it continues to appear smaller each week. There is some slough on the surface but there is epithelium filling in down the edges of the indentation of the wound. Integumentary (Hair, Skin) Wound #1 status is Open. Original cause of wound was Trauma. The date acquired was: 10/14/2020. The wound has been in treatment 96 [name]. The wound is located on the Left Ankle. The wound measures 0.8cm length x 1.2cm width x 0.2cm depth; 0.754cm^2 area and 0.151cm^3 volume. There is Fat Layer (Subcutaneous Tissue) exposed. There is no tunneling or undermining noted. There is a medium amount of serous drainage noted. The wound margin is flat and intact. There is small (1-33%) red granulation within the wound bed. There is a large (67-100%) amount of necrotic tissue within the wound bed including Adherent Slough. The periwound skin  appearance had no abnormalities noted for moisture. The periwound skin appearance had no abnormalities noted for color. The periwound skin appearance exhibited: Scarring. Periwound temperature was noted as No Abnormality. 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 There was a Selective/Open Wound Non-Viable Tissue Debridement with a total area of 0.75 sq cm performed by [name] Guess, MD. With the following instrument(s): Curette to remove Non-Viable tissue/material. Material removed includes [facility] after achieving pain control using Lidocaine 4% Topical Solution. No specimens were taken. A time out was conducted at 08:44, prior to the start of the procedure. A Minimum amount of bleeding was controlled with Pressure. The procedure was tolerated well. Post Debridement Measurements: 0.8cm length x 1.2cm width x 0.2cm depth; 0.151cm^3 volume. Character of Wound/Ulcer Post Debridement is improved. Severity of Tissue Post Debridement is: Fat layer exposed. Post procedure Diagnosis Wound #1: Same as Pre-Procedure General Notes: scribed for Dr. Lady Gary by [name] Bruin,  RN. [name] ([id number]) [id number] Page 11 of 12 Pre-procedure diagnosis of Wound #1 is a Venous Leg Ulcer located on the Left Ankle . There was a Double Layer Compression Therapy Procedure by [name] Bruin, RN. Post procedure Diagnosis Wound #1: Same as Pre-Procedure Plan Follow-up Appointments: Return Appointment in 1 week. - Dr. Lady Gary - Room 4 Other: - [place] interpreter required. Anesthetic: (In clinic) Topical Lidocaine 4% applied to wound bed Bathing/  Shower/ Hygiene: Other Bathing/Shower/Hygiene Orders/Instructions: - Please use a " CAST PROTECTOR" when showering. This can be purchased from [employer], Medical supply store,[employer]. Cost is approximately $14-27. Edema Control - Lymphedema / SCD / Other: Elevate legs to the level of the heart or above for 30 minutes daily and/or when sitting for 3-4 times a day throughout the day. Avoid standing for long periods of time. Exercise regularly Off-Loading: Open toe surgical shoe to: - left foot The following medication(s) was prescribed: lidocaine topical 4 % cream cream topical was prescribed at facility WOUND #1: - Ankle Wound Laterality: Left Cleanser: Soap and Water 1 x Per Week/ Discharge Instructions: May shower and wash wound with dial antibacterial soap and water prior to dressing change. Cleanser: Vashe 5.8 (oz) 1 x Per Week/ Discharge Instructions: Cleanse the wound with Vashe prior to applying a clean dressing using gauze sponges, not tissue or cotton balls. Peri-Wound Care: Sween Lotion (Moisturizing lotion) 1 x Per Week/ Discharge Instructions: Apply moisturizing lotion as directed Topical: Gentamicin 1 x Per Week/ Discharge Instructions: As directed by physician Prim Dressing: Endoform 2x2 in 1 x Per Week/ ary Discharge Instructions: Moisten with saline Secondary Dressing: Drawtex 4x4 in 1 x Per Week/ Discharge Instructions: Apply over primary dressing as directed. Secondary Dressing: Woven Gauze Sponge, Non-Sterile 4x4 in 1 x Per Week/ Discharge Instructions: Apply over primary dressing as directed. Com pression Wrap: Urgo K2 Lite, two layer compression system, regular 1 x Per Week/ Discharge Instructions: Apply Urgo K2 Lite as directed (alternative to 3 layer compression). 12/09/2022: Once again, the wound measurements were not significantly different, but visually it continues to appear smaller each week. There is some slough on the surface but there is epithelium filling in  down the edges of the indentation of the wound. I used a curette to debride the slough from the wound surface. We will continue topical gentamicin with endoform backed with drawtex and a 3 layer compression wrap. Follow-up in 1 week. Electronic Signature(s) Signed: 12/09/2022 8:50:50 AM By: [name] Guess MD FACS Entered By: [name] on 12/09/2022 08:50:50 -------------------------------------------------------------------------------- HxROS Details Patient Name: Date of Service: [name], [name] 12/09/2022 8:30 A M Medical Record Number: [id number] Patient Account Number: [id number] Date of Birth/Sex: Treating RN: 01-07-1976 (47 y.o. M) Primary Care Provider: Richarda [name] Other Clinician: Referring Provider: Treating Provider/Extender: [name], [name] in Treatment: 45 Information Obtained From Patient Gastrointestinal Medical History: Past Medical History Notes: Chronic Gastritis Immunizations Pedley, [name] ([id number]) [id number] Page 12 of 12 Pneumococcal Vaccine: Received Pneumococcal Vaccination: No Implantable Devices No devices added Family and Social History Unknown History: Yes; Current every day smoker; Marital Status - Married; Alcohol Use: Rarely; Drug Use: No History; Caffeine Use: Moderate; Financial Concerns: No; Food, Clothing or Shelter Needs: No; Support System Lacking: No; Transportation Concerns: No Electronic Signature(s) Signed: 12/09/2022 9:53:54 AM By: [name] Guess MD FACS Entered By: [name] on 12/09/2022 08:48:55 -------------------------------------------------------------------------------- SuperBill Details Patient Name: Date of Service: [name] 12/09/2022 Medical Record Number: [id number] Patient Account Number: [id number] Date  of Birth/Sex: Treating RN: Sep 30, 1975 (47 y.o. M) Primary Care Provider: Richarda [name] Other Clinician: Referring  Provider: Treating Provider/Extender: [name], [name] in Treatment: 96 Diagnosis Coding ICD-10 Codes Code Description [phone] Non-pressure chronic ulcer of left ankle with other specified severity I87.332 Chronic venous hypertension (idiopathic) with ulcer and inflammation of left lower extremity Facility Procedures : CPT4 Code: [id number] Description: 97597 - DEBRIDE WOUND 1ST 20 SQ CM OR < ICD-10 Diagnosis Description L97.328 Non-pressure chronic ulcer of left ankle with other specified severity Modifier: Quantity: 1 Physician Procedures : CPT4 Code Description Modifier [id number] - WC PHYS LEVEL 4 - EST PT 25 ICD-10 Diagnosis Description L97.328 Non-pressure chronic ulcer of left ankle with other specified severity I87.332 Chronic venous hypertension (idiopathic) with ulcer and  inflammation of left lower extremity Quantity: 1 : [id number] 97597 - WC PHYS DEBR WO ANESTH 20 SQ CM ICD-10 Diagnosis Description L97.328 Non-pressure chronic ulcer of left ankle with other specified severity Quantity: 1 Electronic Signature(s) Signed: 12/09/2022 8:51:03 AM By: [name] Guess MD FACS Entered By: [name] on 12/09/2022 08:51:03

## 2022-12-09 NOTE — Progress Notes (Signed)
Hubble, [name] ([id number]) [id number] Page 1 of 7 Visit Report for 12/09/2022 Arrival Information Details Patient Name: Date of Service: [name] [place] NIE 12/09/2022 8:30 A M Medical Record Number: [id number] Patient Account Number: [id number] Date of Birth/Sex: Treating RN: 1975-08-21 (47 y.o. [name] Primary Care Terra Aveni: [name] Other Clinician: Referring Cougar Imel: Treating Jowanna Loeffler/Extender: Pleas Patricia in Treatment: 57 Visit Information History Since Last Visit Added or deleted any medications: No Patient Arrived: Ambulatory Any new allergies or adverse reactions: No Arrival Time: 08:29 Had a fall or experienced change in No Accompanied By: translator activities of daily living that may affect Transfer Assistance: None risk of falls: Patient Identification Verified: Yes Signs or symptoms of abuse/neglect since last visito No Secondary Verification Process Completed: Yes Hospitalized since last visit: No Patient Requires Transmission-Based Precautions: No Implantable device outside of the clinic excluding No Patient Has Alerts: No cellular tissue based products placed in the center since last visit: Has Dressing in Place as Prescribed: Yes Has Compression in Place as Prescribed: Yes Pain Present Now: No Electronic Signature(s) Signed: 12/09/2022 3:37:40 PM By: Samuella Bruin Entered By: Samuella Bruin on 12/09/2022 08:32:19 -------------------------------------------------------------------------------- Compression Therapy Details Patient Name: Date of Service: [name] 12/09/2022 8:30 A M Medical Record Number: [id number] Patient Account Number: [id number] Date of Birth/Sex: Treating RN: May 31, 1976 (47 y.o. [name] Primary Care Kailah Pennel: [name] Other Clinician: Referring Grantley Savage: Treating Teesha Ohm/Extender: Duanne Guess [name], Dinah Weeks in  Treatment: 83 Compression Therapy Performed for Wound Assessment: Wound #1 Left Ankle Performed By: Clinician Samuella Bruin, RN Compression Type: Double Layer Post Procedure Diagnosis Same as Pre-procedure Electronic Signature(s) Signed: 12/09/2022 3:37:40 PM By: Gelene Mink By: Samuella Bruin on 12/09/2022 08:45:43 -------------------------------------------------------------------------------- Encounter Discharge Information Details Patient Name: Date of Service: [name] 12/09/2022 8:30 A M Medical Record Number: [id number] Patient Account Number: [id number] Date of Birth/Sex: Treating RN: Jun 28, 1976 (48 y.o. [name] Primary Care Hue Frick: [name] Other Clinician: Referring Montgomery Favor: Treating Marya Lowden/Extender: Pleas Patricia in Treatment: 51 Encounter Discharge Information Items Post Procedure Vitals Discharge Condition: Stable Temperature (F): 98.3 Ambulatory Status: Ambulatory Pulse (bpm): 69 Discharge Destination: Home Respiratory Rate (breaths/min): 16 Transportation: Private Auto Blood Pressure (mmHg): 112/76 Altemose, [name] ([id number]) [id number].pdf Page 2 of 7 Accompanied By: translator Schedule Follow-up Appointment: Yes Clinical Summary of Care: Patient Declined Electronic Signature(s) Signed: 12/09/2022 3:37:40 PM By: Gelene Mink By: Samuella Bruin on 12/09/2022 09:01:12 -------------------------------------------------------------------------------- Lower Extremity Assessment Details Patient Name: Date of Service: [name] 12/09/2022 8:30 A M Medical Record Number: [id number] Patient Account Number: [id number] Date of Birth/Sex: Treating RN: 1975/09/03 (47 y.o. [name] Primary Care Iwao Shamblin: [name] Other Clinician: Referring Keylah Darwish: Treating Chinara Hertzberg/Extender: Duanne Guess [name], Dinah Weeks in  Treatment: 96 Edema Assessment Assessed: [Left: No] [Right: No] Edema: [Left: Ye] [Right: s] Calf Left: Right: Point of Measurement: 28 cm From Medial Instep 29 cm Ankle Left: Right: Point of Measurement: 8 cm From Medial Instep 22 cm Vascular Assessment Pulses: Dorsalis Pedis Palpable: [Left:Yes] Electronic Signature(s) Signed: 12/09/2022 3:37:40 PM By: Samuella Bruin Entered By: Samuella Bruin on 12/09/2022 08:36:15 -------------------------------------------------------------------------------- Multi Wound Chart Details Patient Name: Date of Service: [name] 12/09/2022 8:30 A M Medical Record Number: [id number] Patient Account Number: [id number] Date of Birth/Sex: Treating RN: 08-28-1975 (47 y.o. M) Primary Care Oryn Casanova: [name] Other Clinician: Referring Zaron Zwiefelhofer: Treating Tasia Liz/Extender: Jason Nest, Dinah Weeks in Treatment:  96 Vital Signs Height(in): 69 Pulse(bpm): 69 Weight(lbs): 170 Blood Pressure(mmHg): 112/76 Body Mass Index(BMI): 25.1 Temperature(F): 98.3 Respiratory Rate(breaths/min): 16 [1:Photos:] [N/A:N/A] Left Ankle N/A N/A Wound Location: Trauma N/A N/A Wounding Event: Venous Leg Ulcer N/A N/A Primary Etiology: 10/14/2020 N/A N/A Date Acquired: 52 N/A N/A Weeks of Treatment: Open N/A N/A Wound Status: No N/A N/A Wound Recurrence: 0.8x1.2x0.2 N/A N/A Measurements L x W x D (cm) 0.754 N/A N/A A (cm) : rea 0.151 N/A N/A Volume (cm) : 98.80% N/A N/A % Reduction in A rea: 99.40% N/A N/A % Reduction in Volume: Full Thickness Without Exposed N/A N/A Classification: Support Structures Medium N/A N/A Exudate A mount: Serous N/A N/A Exudate Type: amber N/A N/A Exudate Color: Flat and Intact N/A N/A Wound Margin: Small (1-33%) N/A N/A Granulation A mount: Red N/A N/A Granulation Quality: Large (67-100%) N/A N/A Necrotic A mount: Fat Layer (Subcutaneous Tissue): Yes N/A N/A Exposed  Structures: Fascia: No Tendon: No Muscle: No Joint: No Bone: No Large (67-100%) N/A N/A Epithelialization: Debridement - Selective/Open Wound N/A N/A Debridement: Pre-procedure Verification/Time Out 08:44 N/A N/A Taken: Lidocaine 4% Topical Solution N/A N/A Pain Control: Slough N/A N/A Tissue Debrided: Non-Viable Tissue N/A N/A Level: 0.75 N/A N/A Debridement A (sq cm): rea Curette N/A N/A Instrument: Minimum N/A N/A Bleeding: Pressure N/A N/A Hemostasis A chieved: Procedure was tolerated well N/A N/A Debridement Treatment Response: 0.8x1.2x0.2 N/A N/A Post Debridement Measurements L x W x D (cm) 0.151 N/A N/A Post Debridement Volume: (cm) Scarring: Yes N/A N/A Periwound Skin Texture: Maceration: No N/A N/A Periwound Skin Moisture: Dry/Scaly: No No Abnormalities Noted N/A N/A Periwound Skin Color: No Abnormality N/A N/A Temperature: Compression Therapy N/A N/A Procedures Performed: Debridement Treatment Notes Electronic Signature(s) Signed: 12/09/2022 8:47:50 AM By: [name] FACS Entered By: Duanne Guess on 12/09/2022 08:47:50 -------------------------------------------------------------------------------- Multi-Disciplinary Care Plan Details Patient Name: Date of Service: [name] 12/09/2022 8:30 A M Medical Record Number: [id number] Patient Account Number: [id number] Date of Birth/Sex: Treating RN: Aug 15, 1975 (47 y.o. [name] Primary Care Shavelle Runkel: [name] Other Clinician: Referring Kaleb Sek: Treating Cashlyn Huguley/Extender: Pleas Patricia in Treatment: 93 Multidisciplinary Care Plan reviewed with physician Active Inactive Venous Leg Ulcer Nursing Diagnoses: Actual venous Insuffiency (use after diagnosis is confirmed) Winders, [name] ([id number]) [id number].pdf Page 4 of 7 Knowledge deficit related to disease process and management Goals: Patient will  maintain optimal edema control Date Initiated: 02/19/2022 Target Resolution Date: 12/29/2022 Goal Status: Active Interventions: Assess peripheral edema status every visit. Compression as ordered Treatment Activities: Therapeutic compression applied : 02/19/2022 Notes: Wound/Skin Impairment Nursing Diagnoses: Knowledge deficit related to ulceration/compromised skin integrity Goals: Patient/caregiver will verbalize understanding of skin care regimen Date Initiated: 02/05/2021 Target Resolution Date: 12/29/2022 Goal Status: Active Interventions: Assess patient/caregiver ability to obtain necessary supplies Assess patient/caregiver ability to perform ulcer/skin care regimen upon admission and as needed Provide education on ulcer and skin care Treatment Activities: Skin care regimen initiated : 02/05/2021 Topical wound management initiated : 02/05/2021 Notes: 03/31/21: Wound care regimen ongoing, target date extended. 04/21/21: Wound care ongoing, through interpreter patient states he is doing fine with his dressing changes. Electronic Signature(s) Signed: 12/09/2022 3:37:40 PM By: Gelene Mink By: Samuella Bruin on 12/09/2022 08:39:40 -------------------------------------------------------------------------------- Pain Assessment Details Patient Name: Date of Service: [name] 12/09/2022 8:30 A M Medical Record Number: [id number] Patient Account Number: [id number] Date of Birth/Sex: Treating RN: June 04, 1976 (47 y.o. [name] Primary Care Garek Schuneman: [name] Other  Clinician: Referring Merlinda Wrubel: Treating Whitley Patchen/Extender: Jason Nest, Dinah Weeks in Treatment: 74 Active Problems Location of Pain Severity and Description of Pain Patient Has Paino No Site Locations Rate the pain. Current Pain Level: 0 Bezold, [name] ([id number]) [phone].pdf Page 5 of 7 Pain Management and Medication Current Pain  Management: Electronic Signature(s) Signed: 12/09/2022 3:37:40 PM By: Samuella Bruin Entered By: Samuella Bruin on 12/09/2022 08:32:35 -------------------------------------------------------------------------------- Patient/Caregiver Education Details Patient Name: Date of Service: [name] 5/9/2024andnbsp8:30 A M Medical Record Number: [id number] Patient Account Number: [id number] Date of Birth/Gender: Treating RN: 1976-06-21 (47 y.o. [name] Primary Care Physician: [name] Other Clinician: Referring Physician: Treating Physician/Extender: Pleas Patricia in Treatment: 56 Education Assessment Education Provided To: Patient Education Topics Provided Wound/Skin Impairment: Methods: Explain/Verbal Responses: Reinforcements needed, State content correctly Electronic Signature(s) Signed: 12/09/2022 3:37:40 PM By: Samuella Bruin Entered By: Samuella Bruin on 12/09/2022 08:39:52 -------------------------------------------------------------------------------- Wound Assessment Details Patient Name: Date of Service: [name] 12/09/2022 8:30 A M Medical Record Number: [id number] Patient Account Number: [id number] Date of Birth/Sex: Treating RN: 02-20-1976 (47 y.o. [name] Primary Care Cambri Plourde: [name] Other Clinician: Referring Ja Pistole: Treating Maely Clements/Extender: Jason Nest, Dinah Weeks in Treatment: 96 Wound Status Wound Number: 1 Primary Etiology: Venous Leg Ulcer Wound Location: Left Ankle Wound Status: Open Wounding Event: Trauma Date Acquired: 10/14/2020 Weeks Of Treatment: 96 Clustered Wound: No Photos Wound Measurements Perrow, Archit ([id number]) Length: (cm) 0.8 Width: (cm) 1.2 Depth: (cm) 0.2 Area: (cm) 0.754 Volume: (cm) 0.151 [id number] Page 6 of 7 % Reduction in Area: 98.8% % Reduction in Volume:  99.4% Epithelialization: Large (67-100%) Tunneling: No Undermining: No Wound Description Classification: Full Thickness Without Exposed Support Structures Wound Margin: Flat and Intact Exudate Amount: Medium Exudate Type: Serous Exudate Color: amber Foul Odor After Cleansing: No Slough/Fibrino Yes Wound Bed Granulation Amount: Small (1-33%) Exposed Structure Granulation Quality: Red Fascia Exposed: No Necrotic Amount: Large (67-100%) Fat Layer (Subcutaneous Tissue) Exposed: Yes Necrotic Quality: Adherent Slough Tendon Exposed: No Muscle Exposed: No Joint Exposed: No Bone Exposed: No Periwound Skin Texture Texture Color No Abnormalities Noted: No No Abnormalities Noted: Yes Scarring: Yes Temperature / Pain Temperature: No Abnormality Moisture No Abnormalities Noted: Yes Treatment Notes Wound #1 (Ankle) Wound Laterality: Left Cleanser Soap and Water Discharge Instruction: May shower and wash wound with dial antibacterial soap and water prior to dressing change. Vashe 5.8 (oz) Discharge Instruction: Cleanse the wound with Vashe prior to applying a clean dressing using gauze sponges, not tissue or cotton balls. Peri-Wound Care Sween Lotion (Moisturizing lotion) Discharge Instruction: Apply moisturizing lotion as directed Topical Gentamicin Discharge Instruction: As directed by physician Primary Dressing Endoform 2x2 in Discharge Instruction: Moisten with saline Secondary Dressing Drawtex 4x4 in Discharge Instruction: Apply over primary dressing as directed. Woven Gauze Sponge, Non-Sterile 4x4 in Discharge Instruction: Apply over primary dressing as directed. Secured With Compression Wrap Urgo K2 Lite, two layer compression system, regular Discharge Instruction: Apply Urgo K2 Lite as directed (alternative to 3 layer compression). Compression Stockings Add-Ons Electronic Signature(s) Signed: 12/09/2022 3:37:40 PM By: Samuella Bruin Entered By: Samuella Bruin on 12/09/2022 08:38:16 Creech, [name] ([id number]) [id number] Page 7 of 7 -------------------------------------------------------------------------------- Vitals Details Patient Name: Date of Service: [name] 12/09/2022 8:30 A M Medical Record Number: [id number] Patient Account Number: [id number] Date of Birth/Sex: Treating RN: 18-Nov-1975 (47 y.o. [name] Primary Care Darrik Richman: [name] Other Clinician: Referring Sonam Wandel: Treating Chanley Mcenery/Extender: Jason Nest, Dinah Tania Ade  in Treatment: 96 Vital Signs Time Taken: 08:32 Temperature (F): 98.3 Height (in): 69 Pulse (bpm): 69 Weight (lbs): 170 Respiratory Rate (breaths/min): 16 Body Mass Index (BMI): 25.1 Blood Pressure (mmHg): 112/76 Reference Range: 80 - 120 mg / dl Electronic Signature(s) Signed: 12/09/2022 3:37:40 PM By: Samuella Bruin Entered By: Samuella Bruin on 12/09/2022 08:32:30

## 2022-12-10 ENCOUNTER — Other Ambulatory Visit: Payer: Medicaid Other

## 2022-12-10 NOTE — Patient Outreach (Signed)
Medicaid Managed Care Social Work Note  12/10/2022 Name:  [name] MRN:  [id number] DOB:  May 11, 1976  [name] is an 47 y.o. year old male who is a primary patient of [name], [name], NP.  The [facility] Managed Care Coordination team was consulted for assistance with:  Community Resources   [name] was given information about Medicaid Managed Care Coordination team services today. [name] Patient agreed to services and verbal consent obtained.  Engaged with patient  for by telephone forinitial visit in response to referral for case management and/or care coordination services.   Assessments/Interventions:  Review of past medical history, allergies, medications, health status, including review of consultants reports, laboratory and other test data, was performed as part of comprehensive evaluation and provision of chronic care management services.  SDOH: (Social Determinant of Health) assessments and interventions performed: SDOH Interventions    Flowsheet Row Patient Outreach Telephone from 10/04/2022 in [place] [facility] DEPARTMENT Patient Outreach Telephone from 07/27/2022 in [place] POPULATION HEALTH DEPARTMENT  SDOH Interventions    Food Insecurity Interventions -- Intervention Not Indicated  Housing Interventions -- Other (Comment)  [BSW referral, scheduled on 07/28/22 at 3pm]  Transportation Interventions -- Intervention Not Indicated  Financial Strain Interventions Other (Comment)  [referral to BSW] --     BSW completed a telephone outreach with patient using an interpreter, patient states he is currently out of work, his wife is the only one working in the household. He does receive foodstamps but has to reapply. Patient states he would also like medical insurance, BSW informed patient he does have [employer]. Patient states he is not behind on his rent and utilities, but does need resources. BSW will send patient resources for rent  utilities, food, and information for applying for disability. Patient does have individuals that can read english and would be able to assist with reading resources.  Advanced Directives Status:  Not addressed in this encounter.  Care Plan                 No Known Allergies  Medications Reviewed Today     Reviewed by [name], NP (Nurse Practitioner) on 12/01/22 at 2139  Med List Status: <None>   Medication Order Taking? Sig Documenting Provider Last Dose Status Informant  buPROPion (WELLBUTRIN SR) 150 MG 12 hr tablet [id number] Yes Take 1 tablet (150 mg total) by mouth daily. [name], [name], NP  Active   omeprazole (PRILOSEC) 40 MG capsule [id number] No Take 1 capsule (40 mg total) by mouth daily. [name], PA-C Taking Active   Tdap [name]) 5-2.5-18.5 LF-MCG/0.5 injection [id number] Yes Inject 0.5 mLs into the muscle once for 1 dose. 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   Chronic gastritis 04/09/2021   Refugee health examination 04/07/2021   Unable to read or write in Kinyar[place] or English  04/07/2021   Tobacco abuse 04/07/2021   Hematuria 04/07/2021   Medication monitoring encounter 03/18/2021   Skin ulcer (HCC) 02/17/2021    Conditions to be addressed/monitored per PCP order:   community resources  Care Plan : RN Care Manager Plan of Care  Updates made by Shaune Leeks since 12/10/2022 12:00 AM     Problem: Health Management needs related to Wound Care      Long-Range Goal: Development of Plan of Care to address  Health Management needs related to Wound Care   Start Date: 07/27/2022  Expected End Date: 12/31/2022  Priority: High  Note:   Current Barriers:  Knowledge Deficits related to plan of care for management of Wound Management  Financial Constraints. Patient does not have a PCP.  RNCM Clinical Goal(s):  Patient will verbalize understanding of plan for  management of Wound Care as evidenced by patient reports attend all scheduled medical appointments: reschedule missed visit with Wound Care as evidenced by provider EMR documentation        work with social worker to address Financial constraints related to housing related to the management of Wound Care as evidenced by review of EMR and patient or social worker report       Interventions: Inter-disciplinary care team collaboration (see longitudinal plan of care) Evaluation of current treatment plan related to  self management and patient's adherence to plan as established by provider  BSW completed a telephone outreach with patient using an interpreter, patient states he is currently out of work, his wife is the only one working in the household. He does receive foodstamps but has to reapply. Patient states he would also like medical insurance, BSW informed patient he does have [employer]. Patient states he is not behind on his rent and utilities, but does need resources. BSW will send patient resources for rent utilities, food, and information for applying for disability. Patient does have individuals that can read english and would be able to assist with reading resources.  Wound Care  (Status: Goal on Track (progressing): YES.) Long Term Goal  Evaluation of current treatment plan related to  Wound Care , Financial constraints related to affording housing  self-management and patient's adherence to plan as established by provider. Discussed plans with patient for ongoing care management follow up and provided patient with direct contact information for care management team Assessed social determinant of health barriers;  Discussed the importance of having a PCP-patient did not go to the new patient visit on 08/18/22, would like to reschedule RNCM sent secure message to scheduler at PCP office requesting to reschedule missed appointment-waiting on response Utilized Swahili interpreter [phone] during  this entire visit Discussed wound care-patient states it is healing and getting much better Collaborated with BSW for assistance with financial resources  Patient Goals/Self-Care Activities: Attend all scheduled provider appointments Work with the social worker to address care coordination needs and will continue to work with the clinical team to address health care and disease management related needs       Follow up:  Patient agrees to Care Plan and Follow-up.  Plan: The Managed Medicaid care management team will reach out to the patient again over the next 30 days.  Date/time of next scheduled Social Work care management/care coordination outreach:  01/11/23  Gus Puma, Kenard Gower, [facility]  Managed [facility] Social Worker [phone]

## 2022-12-10 NOTE — Patient Instructions (Signed)
Visit Information  Mr. Wroble was given information about Medicaid Managed Care team care coordination services as a part of their [facility] Medicaid benefit. Jaze Mikita verbally consented to engagement with the [facility] Managed Care team.   If you are experiencing a medical emergency, please call 911 or report to your local emergency department or urgent care.   If you have a non-emergency medical problem during routine business hours, please contact your provider's office and ask to speak with a nurse.   For questions related to your [facility] health plan, please call: [phone] or go here:[email or web address]  If you would like to schedule transportation through your [facility] plan, please call the following number at least 2 days in advance of your appointment: [phone].   You can also use the MTM portal or MTM mobile app to manage your rides. Reimbursement for transportation is available through [facility]! For the portal, please go to [email or web address]  Call the [facility] Crisis Line at [phone], at any time, 24 hours a day, 7 days a week. If you are in danger or need immediate medical attention call 911.  If you would like help to quit smoking, call 1-800-QUIT-NOW (937-030-5331) OR Espaol: 1-855-Djelo-Ya (9-563-875-6433) o para ms informacin haga clic aqu or Text READY to 295-188 to register via text  Mr. Reuter - following are the goals we discussed in your visit today:   Goals Addressed   None      Social Worker will follow up in 30 days.   Gus Puma, Kenard Gower, MHA [facility]  Managed Medicaid Social Worker [phone]   Following is a copy of your plan of care:  Care Plan : RN Care Manager Plan of Care  Updates made by Shaune Leeks since 12/10/2022 12:00 AM     Problem: Health Management needs related to Wound Care      Long-Range Goal: Development of Plan of Care to address Health Management  needs related to Wound Care   Start Date: 07/27/2022  Expected End Date: 12/31/2022  Priority: High  Note:   Current Barriers:  Knowledge Deficits related to plan of care for management of Wound Management  Financial Constraints. Patient does not have a PCP.  RNCM Clinical Goal(s):  Patient will verbalize understanding of plan for management of Wound Care as evidenced by patient reports attend all scheduled medical appointments: reschedule missed visit with Wound Care as evidenced by provider EMR documentation        work with social worker to address Financial constraints related to housing related to the management of Wound Care as evidenced by review of EMR and patient or social worker report       Interventions: Inter-disciplinary care team collaboration (see longitudinal plan of care) Evaluation of current treatment plan related to  self management and patient's adherence to plan as established by provider  BSW completed a telephone outreach with patient using an interpreter, patient states he is currently out of work, his wife is the only one working in the household. He does receive foodstamps but has to reapply. Patient states he would also like medical insurance, BSW informed patient he does have Well[employer]. Patient states he is not behind on his rent and utilities, but does need resources. BSW will send patient resources for rent utilities, food, and information for applying for disability. Patient does have individuals that can read english and would be able to assist with reading resources.  Wound Care  (Status: Goal on Track (progressing): YES.) Long Term  Goal  Evaluation of current treatment plan related to  Wound Care , Financial constraints related to affording housing  self-management and patient's adherence to plan as established by provider. Discussed plans with patient for ongoing care management follow up and provided patient with direct contact information for care management  team Assessed social determinant of health barriers;  Discussed the importance of having a PCP-patient did not go to the new patient visit on 08/18/22, would like to reschedule RNCM sent secure message to scheduler at PCP office requesting to reschedule missed appointment-waiting on response Utilized Swahili interpreter [phone] during this entire visit Discussed wound care-patient states it is healing and getting much better Collaborated with BSW for assistance with financial resources  Patient Goals/Self-Care Activities: Attend all scheduled provider appointments Work with the social worker to address care coordination needs and will continue to work with the clinical team to address health care and disease management related needs

## 2022-12-13 ENCOUNTER — Ambulatory Visit (INDEPENDENT_AMBULATORY_CARE_PROVIDER_SITE_OTHER): Payer: Medicaid Other | Admitting: Family

## 2022-12-13 ENCOUNTER — Encounter: Payer: Self-pay | Admitting: Family

## 2022-12-13 VITALS — BP 116/70 | HR 71 | Temp 97.6°F | Ht 69.0 in | Wt 155.4 lb

## 2022-12-13 DIAGNOSIS — A048 Other specified bacterial intestinal infections: Secondary | ICD-10-CM | POA: Diagnosis not present

## 2022-12-13 MED ORDER — PANTOPRAZOLE SODIUM 40 MG PO TBEC
40.0000 mg | DELAYED_RELEASE_TABLET | Freq: Two times a day (BID) | ORAL | 0 refills | Status: DC
Start: 2022-12-13 — End: 2023-01-04

## 2022-12-13 MED ORDER — METRONIDAZOLE 500 MG PO TABS
500.0000 mg | ORAL_TABLET | Freq: Two times a day (BID) | ORAL | 0 refills | Status: AC
Start: 2022-12-13 — End: 2022-12-27

## 2022-12-13 MED ORDER — TETRACYCLINE HCL 500 MG PO TABS
500.0000 mg | ORAL_TABLET | Freq: Two times a day (BID) | ORAL | 0 refills | Status: DC
Start: 2022-12-13 — End: 2022-12-14

## 2022-12-13 NOTE — Progress Notes (Signed)
Provider: [name] FNP-C  [place] Shannahan, [name], NP  Patient Care Team: Angeline Trick, [name], NP as PCP - General (Family Medicine) [name], DO (Family Medicine) Heidi Dach, RN as Case Manager Shaune Leeks as Social Worker  Extended Emergency Contact Information Primary Emergency Contact: nyiramutuzo,donatha Mobile Phone: [phone] Relation: Spouse Preferred language: [place]; [place] Interpreter needed? Yes Secondary Emergency Contact: Griffine,Laquisha Mobile Phone: [phone] Relation: Other  Code Status:  Full Code  Goals of care: Advanced Directive information    05/02/2022    8:51 AM  Advanced Directives  Does Patient Have a Medical Advance Directive? No     Chief Complaint  Patient presents with   Acute Visit    Patient presents today to discuss lab work    HPI:  Pt is a 47 y.o. male seen today for an acute visit to discuss lab results.  Lab showed H-Pylori positive.continue to complain of epigastric pain though has slightly improve since he was restarted on Omeprazole on recent visit 12/01/2022. He denies any blood in the stool or dark stool.     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 daily x6 months with B6 25 mg p.o. daily.  Unsure if patient completed this course.   Past Surgical History:  Procedure Laterality Date   FOOT SURGERY      No Known Allergies  Outpatient Encounter Medications as of 12/13/2022  Medication Sig   buPROPion (WELLBUTRIN SR) 150 MG 12 hr tablet Take 1 tablet (150 mg total) by mouth daily.   omeprazole (PRILOSEC) 40 MG capsule Take 1 capsule (40 mg total) by mouth daily.   No facility-administered encounter medications on file as of 12/13/2022.    Review of Systems  Constitutional:  Negative for appetite change, chills, fatigue, fever and unexpected weight change.  Eyes:  Negative for  pain, discharge, redness, itching and visual disturbance.  Respiratory:  Negative for cough, chest tightness, shortness of breath and wheezing.   Cardiovascular:  Negative for chest pain, palpitations and leg swelling.  Gastrointestinal:  Positive for abdominal pain. Negative for abdominal distention, blood in stool, constipation, diarrhea, nausea and vomiting.  Neurological:  Negative for dizziness, weakness, light-headedness and headaches.  Hematological:  Does not bruise/bleed easily.  Psychiatric/Behavioral:  Negative for agitation, behavioral problems, confusion and sleep disturbance. The patient is not nervous/anxious.     Immunization History  Administered Date(s) Administered   Tdap 12/01/2022   Pertinent  Health Maintenance Due  Topic Date Due   COLONOSCOPY (Pts 45-53yrs Insurance coverage will need to be confirmed)  Never done   INFLUENZA VACCINE  03/03/2023      02/27/2021    9:45 AM 05/18/2021   11:06 AM 04/25/2022    2:19 PM 05/01/2022    6:39 PM 05/02/2022    8:49 AM  Fall Risk  Falls in the past year? 0 0     Was there an injury with Fall? 0 0     Fall Risk Category Calculator 0 0     Fall Risk Category (Retired) Low Low     (RETIRED) Patient Fall Risk Level Low fall risk Low fall risk Low fall risk Low fall risk Low fall risk   Functional Status Survey:    Vitals:   12/13/22 1506  BP: 116/70  Pulse: 71  Temp: 97.6 F (36.4 C)  SpO2:  97%  Weight: 155 lb 6.4 oz (70.5 kg)  Height: 5\' 9"  (1.753 m)   Body mass index is 22.95 kg/m. Physical Exam Vitals reviewed.  Constitutional:      General: He is not in acute distress.    Appearance: Normal appearance. He is normal weight. He is not ill-appearing or diaphoretic.  HENT:     Head: Normocephalic.     Mouth/Throat:     Mouth: Mucous membranes are moist.     Pharynx: Oropharynx is clear. No oropharyngeal exudate or posterior oropharyngeal erythema.  Eyes:     General: No scleral icterus.       Right eye:  No discharge.        Left eye: No discharge.     Extraocular Movements: Extraocular movements intact.     Conjunctiva/sclera: Conjunctivae normal.     Pupils: Pupils are equal, round, and reactive to light.  Cardiovascular:     Rate and Rhythm: Normal rate and regular rhythm.     Pulses: Normal pulses.     Heart sounds: Normal heart sounds. No murmur heard.    No friction rub. No gallop.  Pulmonary:     Effort: Pulmonary effort is normal. No respiratory distress.     Breath sounds: Normal breath sounds. No wheezing, rhonchi or rales.  Chest:     Chest wall: No tenderness.  Abdominal:     General: Bowel sounds are normal. There is no distension.     Palpations: Abdomen is soft. There is no mass.     Tenderness: There is abdominal tenderness in the epigastric area. There is no right CVA tenderness, left CVA tenderness, guarding or rebound.  Neurological:     Mental Status: He is alert and oriented to person, place, and time.     Motor: No weakness.     Gait: Gait normal.  Psychiatric:        Mood and Affect: Mood normal.        Speech: Speech normal.        Behavior: Behavior normal.    Labs reviewed: Recent Labs    04/25/22 1419 05/01/22 2052 05/02/22 0947 12/01/22 1138  NA 140 143  --  142  K 3.6 2.8*  --  3.9  CL 116* 111  --  112*  CO2 19* 23  --  20  GLUCOSE 98 90  --  99  BUN 9 12  --  12  CREATININE 1.07 1.23  --  0.89  CALCIUM 9.4 9.4  --  9.9  MG  --   --  2.2  --    Recent Labs    05/01/22 2052 12/01/22 1138  AST 21 15  ALT 16 13  ALKPHOS 53  --   BILITOT 0.6 0.5  PROT 6.5 7.1  ALBUMIN 3.8  --    Recent Labs    04/25/22 1419 05/01/22 2258 12/01/22 1138  WBC 4.0 4.2 6.6  NEUTROABS  --   --  4,297  HGB 15.3 15.3 16.2  HCT 46.1 45.4 47.3  MCV 100.4* 97.0 98.1  PLT 219 219 259   Lab Results  Component Value Date   TSH 0.793 04/07/2021   No results found for: "HGBA1C" Lab Results  Component Value Date   CHOL 119 12/01/2022   HDL 38 (L)  12/01/2022   LDLCALC 58 12/01/2022   TRIG 146 12/01/2022   CHOLHDL 3.1 12/01/2022    Significant Diagnostic Results in last 30 days:  No results found.  Assessment/Plan   H. pylori infection Epigastric tenderness on palpation  H.Pylori breath test positive  - Start on Flagyl,Protonix and tetracycline as below.medication compliance emphasized.  - metroNIDAZOLE (FLAGYL) 500 MG tablet; Take 1 tablet (500 mg total) by mouth 2 (two) times daily for 14 days.  Dispense: 28 tablet; Refill: 0 - pantoprazole (PROTONIX) 40 MG tablet; Take 1 tablet (40 mg total) by mouth 2 (two) times daily for 14 days.  Dispense: 28 tablet; Refill: 0  Family/ staff Communication: Reviewed plan of care with patient verbalized understanding   Labs/tests ordered: None   Next Appointment: Return in about 3 weeks (around 01/03/2023) for epigastric pain .   [name], NP

## 2022-12-14 ENCOUNTER — Telehealth: Payer: Self-pay

## 2022-12-14 DIAGNOSIS — A048 Other specified bacterial intestinal infections: Secondary | ICD-10-CM

## 2022-12-14 MED ORDER — CLARITHROMYCIN 500 MG PO TABS
500.0000 mg | ORAL_TABLET | Freq: Two times a day (BID) | ORAL | 0 refills | Status: AC
Start: 2022-12-14 — End: 2022-12-28

## 2022-12-14 NOTE — Telephone Encounter (Signed)
-   may discontinue Tetracycline if not covered by insurance then start on Clarithromycin 500 mg tablet one by mouth twice daily x 14 days

## 2022-12-14 NOTE — Telephone Encounter (Signed)
PA request received from covermymeds for tatracycline 500 mg tablet.  PA initiated and waiting on reply

## 2022-12-14 NOTE — Telephone Encounter (Signed)
PA denied. They request that patient has tried and failed at least 2 preferred drugs. Preferred drugs as listed below  Letter is also available under media tab.  Message sent to [name], NP

## 2022-12-15 ENCOUNTER — Other Ambulatory Visit: Payer: Medicaid Other | Admitting: *Deleted

## 2022-12-15 ENCOUNTER — Encounter: Payer: Self-pay | Admitting: *Deleted

## 2022-12-15 NOTE — Patient Instructions (Signed)
Visit Information  [name] was given information about Medicaid Managed Care team care coordination services as a part of their [facility] Medicaid benefit. [name] verbally consented to engagement with the [facility] Managed Care team.   If you are experiencing a medical emergency, please call 911 or report to your local emergency department or urgent care.   If you have a non-emergency medical problem during routine business hours, please contact your provider's office and ask to speak with a nurse.   For questions related to your [facility] health plan, please call: [phone] or go here:[email or web address]  If you would like to schedule transportation through your [facility] plan, please call the following number at least 2 days in advance of your appointment: [phone].   You can also use the MTM portal or MTM mobile app to manage your rides. Reimbursement for transportation is available through [facility]! For the portal, please go to [email or web address]  Call the [facility] Crisis Line at [phone], at any time, 24 hours a day, 7 days a week. If you are in danger or need immediate medical attention call 911.  If you would like help to quit smoking, call 1-800-QUIT-NOW (705 335 4622) OR Espaol: 1-855-Djelo-Ya (0-347-425-9563) o para ms informacin haga clic aqu or Text READY to 875-643 to register via text  [name],   Please see education materials related to H. Pylori provided as print materials.   The patient verbalized understanding of instructions, educational materials, and care plan provided today and agreed to receive a mailed copy of patient instructions, educational materials, and care plan.   Telephone follow up appointment with Managed Medicaid care management team member scheduled for:01/17/23 @ 1 pm  [name] Emms RN, BSN [place]  Managed [facility] RN Care Coordinator [phone]   Following is a copy  of your plan of care:  Care Plan : RN Care Manager Plan of Care  Updates made by [name] Dach, RN since 12/15/2022 12:00 AM     Problem: Health Management needs related to Wound Care      Long-Range Goal: Development of Plan of Care to address Health Management needs related to Wound Care   Start Date: 07/27/2022  Expected End Date: 12/31/2022  Priority: High  Note:   Current Barriers:  Knowledge Deficits related to plan of care for management of Wound Management  Financial Constraints.   RNCM Clinical Goal(s):  Patient will verbalize understanding of plan for management of Wound Care as evidenced by patient reports attend all scheduled medical appointments: reschedule missed visit with Wound Care as evidenced by provider EMR documentation        work with Child psychotherapist to address Financial constraints related to housing related to the management of Wound Care as evidenced by review of EMR and patient or social worker report       Interventions: Inter-disciplinary care team collaboration (see longitudinal plan of care) Evaluation of current treatment plan related to  self management and patient's adherence to plan as established by provider    H. Pylori  (Status:  New goal.)  Long Term Goal Evaluation of current treatment plan related to  H. Pylori ,  language barrier  self-management and patient's adherence to plan as established by provider. Discussed plans with patient for ongoing care management follow up and provided patient with direct contact information for care management team Provided education to patient re: H. Pylori Reviewed medications with patient and discussed the importance of getting all medications and taking as directed, instructed not  to drink alcohol while taking these medications Collaborated with BSW regarding resending requested resources, RNCM updated address in Epic Collaborated with [facility] to request transferring medications, delivery and  starting a charge account [name] interpreter [phone] during this entire visit   Wound Care  (Status: Goal on Track (progressing): YES.) Long Term Goal  Evaluation of current treatment plan related to  Wound Care , Financial constraints related to affording housing  self-management and patient's adherence to plan as established by provider. Discussed plans with patient for ongoing care management follow up and provided patient with direct contact information for care management team Assessed social determinant of health barriers;  Discussed wound care-patient states it is healing and getting much better Collaborated with BSW to resend requested resources  Patient Goals/Self-Care Activities: Attend all scheduled provider appointments Work with the social worker to address care coordination needs and will continue to work with the clinical team to address health care and disease management related needs

## 2022-12-15 NOTE — Patient Outreach (Signed)
Medicaid Managed Care   Nurse Care Manager Note  12/15/2022 Name:  [name] MRN:  [id number] DOB:  01-25-1976  [name] is an 47 y.o. year old male who is a primary patient of [name], [name], NP.  The [facility] Managed Care Coordination team was consulted for assistance with:    H. Pylori Wound Care  [name] was given information about Medicaid Managed Care Coordination team services today. [name] Patient agreed to services and verbal consent obtained.  Engaged with patient by telephone for follow up visit in response to provider referral for case management and/or care coordination services.   Assessments/Interventions:  Review of past medical history, allergies, medications, health status, including review of consultants reports, laboratory and other test data, was performed as part of comprehensive evaluation and provision of chronic care management services.  SDOH (Social Determinants of Health) assessments and interventions performed: SDOH Interventions    Flowsheet Row Patient Outreach Telephone from 10/04/2022 in [place] POPULATION HEALTH DEPARTMENT Patient Outreach Telephone from 07/27/2022 in [place] POPULATION HEALTH DEPARTMENT  SDOH Interventions    Food Insecurity Interventions -- Intervention Not Indicated  Housing Interventions -- Other (Comment)  [BSW referral, scheduled on 07/28/22 at 3pm]  Transportation Interventions -- Intervention Not Indicated  Financial Strain Interventions Other (Comment)  [referral to BSW] --       Care Plan  No Known Allergies  Medications Reviewed Today     Reviewed by Heidi Dach, RN (Registered Nurse) on 12/15/22 at 1109  Med List Status: <None>   Medication Order Taking? Sig Documenting Provider Last Dose Status Informant  buPROPion (WELLBUTRIN SR) 150 MG 12 hr tablet [id number] No Take 1 tablet (150 mg total) by mouth daily. [name], [name], NP Unknown Active   clarithromycin  (BIAXIN) 500 MG tablet [id number] No Take 1 tablet (500 mg total) by mouth 2 (two) times daily for 14 days. [name], [name], NP Unknown Active   metroNIDAZOLE (FLAGYL) 500 MG tablet [id number] No Take 1 tablet (500 mg total) by mouth 2 (two) times daily for 14 days. [name], [name], NP Unknown Active   pantoprazole (PROTONIX) 40 MG tablet [id number] No Take 1 tablet (40 mg total) by mouth 2 (two) times daily for 14 days. 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   Chronic gastritis 04/09/2021   Refugee health examination 04/07/2021   Unable to read or write in Kiny[place] or English  04/07/2021   Tobacco abuse 04/07/2021   Hematuria 04/07/2021   Medication monitoring encounter 03/18/2021   Skin ulcer (HCC) 02/17/2021    Conditions to be addressed/monitored per PCP order:   [name] and Wound Care  Care Plan : RN Care Manager Plan of Care  Updates made by Heidi Dach, RN since 12/15/2022 12:00 AM     Problem: Health Management needs related to Wound Care      Long-Range Goal: Development of Plan of Care to address Health Management needs related to Wound Care   Start Date: 07/27/2022  Expected End Date: 12/31/2022  Priority: High  Note:   Current Barriers:  Knowledge Deficits related to plan of care for management of Wound Management  Financial Constraints.   RNCM Clinical Goal(s):  Patient will verbalize understanding of plan for management of Wound Care as evidenced by patient reports attend all scheduled medical appointments: reschedule  missed visit with Wound Care as evidenced by provider EMR documentation        work with Child psychotherapist to address Financial constraints related to housing related to the management of Wound Care as evidenced by review of EMR and patient or social worker report       Interventions: Inter-disciplinary care team collaboration (see longitudinal plan of  care) Evaluation of current treatment plan related to  self management and patient's adherence to plan as established by provider    H. Pylori  (Status:  New goal.)  Long Term Goal Evaluation of current treatment plan related to  H. Pylori ,  language barrier  self-management and patient's adherence to plan as established by provider. Discussed plans with patient for ongoing care management follow up and provided patient with direct contact information for care management team Provided education to patient re: H. Pylori Reviewed medications with patient and discussed the importance of getting all medications and taking as directed, instructed not to drink alcohol while taking these medications Collaborated with BSW regarding resending requested resources, RNCM updated address in Epic Collaborated with [facility] to request transferring medications, delivery and starting a charge account [name] interpreter [phone] during this entire visit   Wound Care  (Status: Goal on Track (progressing): YES.) Long Term Goal  Evaluation of current treatment plan related to  Wound Care , Financial constraints related to affording housing  self-management and patient's adherence to plan as established by provider. Discussed plans with patient for ongoing care management follow up and provided patient with direct contact information for care management team Assessed social determinant of health barriers;  Discussed wound care-patient states it is healing and getting much better Collaborated with BSW to resend requested resources  Patient Goals/Self-Care Activities: Attend all scheduled provider appointments Work with the social worker to address care coordination needs and will continue to work with the clinical team to address health care and disease management related needs       Follow Up:  Patient agrees to Care Plan and Follow-up.  Plan: The Managed Medicaid care management team will  reach out to the patient again over the next 30 days.  Date/time of next scheduled RN care management/care coordination outreach:  01/17/23 @ 1 pm  [name] RN, BSN [place]  Managed Medicaid RN Care Coordinator [phone]

## 2022-12-16 ENCOUNTER — Encounter (HOSPITAL_BASED_OUTPATIENT_CLINIC_OR_DEPARTMENT_OTHER): Payer: Medicaid Other | Admitting: General Surgery

## 2022-12-16 DIAGNOSIS — I87332 Chronic venous hypertension (idiopathic) with ulcer and inflammation of left lower extremity: Secondary | ICD-10-CM | POA: Diagnosis not present

## 2022-12-16 DIAGNOSIS — L97328 Non-pressure chronic ulcer of left ankle with other specified severity: Secondary | ICD-10-CM | POA: Diagnosis not present

## 2022-12-16 DIAGNOSIS — L97322 Non-pressure chronic ulcer of left ankle with fat layer exposed: Secondary | ICD-10-CM | POA: Diagnosis not present

## 2022-12-16 DIAGNOSIS — M85872 Other specified disorders of bone density and structure, left ankle and foot: Secondary | ICD-10-CM | POA: Diagnosis not present

## 2022-12-16 DIAGNOSIS — F17200 Nicotine dependence, unspecified, uncomplicated: Secondary | ICD-10-CM | POA: Diagnosis not present

## 2022-12-16 DIAGNOSIS — I872 Venous insufficiency (chronic) (peripheral): Secondary | ICD-10-CM | POA: Diagnosis not present

## 2022-12-16 DIAGNOSIS — L97529 Non-pressure chronic ulcer of other part of left foot with unspecified severity: Secondary | ICD-10-CM | POA: Diagnosis not present

## 2022-12-29 ENCOUNTER — Encounter (HOSPITAL_BASED_OUTPATIENT_CLINIC_OR_DEPARTMENT_OTHER): Payer: Medicaid Other | Admitting: General Surgery

## 2022-12-29 DIAGNOSIS — M85872 Other specified disorders of bone density and structure, left ankle and foot: Secondary | ICD-10-CM | POA: Diagnosis not present

## 2022-12-29 DIAGNOSIS — L97328 Non-pressure chronic ulcer of left ankle with other specified severity: Secondary | ICD-10-CM | POA: Diagnosis not present

## 2022-12-29 DIAGNOSIS — L97322 Non-pressure chronic ulcer of left ankle with fat layer exposed: Secondary | ICD-10-CM | POA: Diagnosis not present

## 2022-12-29 DIAGNOSIS — F17200 Nicotine dependence, unspecified, uncomplicated: Secondary | ICD-10-CM | POA: Diagnosis not present

## 2022-12-29 DIAGNOSIS — I872 Venous insufficiency (chronic) (peripheral): Secondary | ICD-10-CM | POA: Diagnosis not present

## 2022-12-29 DIAGNOSIS — I87332 Chronic venous hypertension (idiopathic) with ulcer and inflammation of left lower extremity: Secondary | ICD-10-CM | POA: Diagnosis not present

## 2022-12-29 DIAGNOSIS — L97529 Non-pressure chronic ulcer of other part of left foot with unspecified severity: Secondary | ICD-10-CM | POA: Diagnosis not present

## 2023-01-01 DIAGNOSIS — Z419 Encounter for procedure for purposes other than remedying health state, unspecified: Secondary | ICD-10-CM | POA: Diagnosis not present

## 2023-01-03 ENCOUNTER — Encounter (HOSPITAL_BASED_OUTPATIENT_CLINIC_OR_DEPARTMENT_OTHER): Payer: Medicaid Other | Attending: General Surgery | Admitting: General Surgery

## 2023-01-03 DIAGNOSIS — L97328 Non-pressure chronic ulcer of left ankle with other specified severity: Secondary | ICD-10-CM | POA: Insufficient documentation

## 2023-01-03 DIAGNOSIS — M8580 Other specified disorders of bone density and structure, unspecified site: Secondary | ICD-10-CM | POA: Insufficient documentation

## 2023-01-03 DIAGNOSIS — F17200 Nicotine dependence, unspecified, uncomplicated: Secondary | ICD-10-CM | POA: Diagnosis not present

## 2023-01-03 DIAGNOSIS — I87332 Chronic venous hypertension (idiopathic) with ulcer and inflammation of left lower extremity: Secondary | ICD-10-CM | POA: Diagnosis not present

## 2023-01-03 DIAGNOSIS — K2950 Unspecified chronic gastritis without bleeding: Secondary | ICD-10-CM | POA: Insufficient documentation

## 2023-01-03 DIAGNOSIS — I872 Venous insufficiency (chronic) (peripheral): Secondary | ICD-10-CM | POA: Diagnosis not present

## 2023-01-03 DIAGNOSIS — L97322 Non-pressure chronic ulcer of left ankle with fat layer exposed: Secondary | ICD-10-CM | POA: Diagnosis not present

## 2023-01-04 ENCOUNTER — Ambulatory Visit (INDEPENDENT_AMBULATORY_CARE_PROVIDER_SITE_OTHER): Payer: Medicaid Other | Admitting: Family

## 2023-01-04 ENCOUNTER — Encounter: Payer: Self-pay | Admitting: Family

## 2023-01-04 ENCOUNTER — Ambulatory Visit: Payer: Medicaid Other | Admitting: Family

## 2023-01-04 DIAGNOSIS — A048 Other specified bacterial intestinal infections: Secondary | ICD-10-CM

## 2023-01-04 DIAGNOSIS — Z720 Tobacco use: Secondary | ICD-10-CM | POA: Diagnosis not present

## 2023-01-04 MED ORDER — PANTOPRAZOLE SODIUM 40 MG PO TBEC
40.0000 mg | DELAYED_RELEASE_TABLET | Freq: Two times a day (BID) | ORAL | 0 refills | Status: DC
Start: 2023-01-04 — End: 2023-01-04

## 2023-01-04 MED ORDER — PANTOPRAZOLE SODIUM 40 MG PO TBEC
40.0000 mg | DELAYED_RELEASE_TABLET | Freq: Every day | ORAL | 0 refills | Status: DC
Start: 2023-01-04 — End: 2023-05-30

## 2023-01-04 MED ORDER — BUPROPION HCL ER (SR) 150 MG PO TB12
150.0000 mg | ORAL_TABLET | Freq: Every day | ORAL | 1 refills | Status: DC
Start: 2023-01-04 — End: 2023-05-30

## 2023-01-04 NOTE — Progress Notes (Signed)
Provider: [name] FNP-C  Broghan Pannone, [name], NP  Patient Care Team: Marce Schartz, [name], NP as PCP - General (Family Medicine) [name], DO (Family Medicine) Heidi Dach, RN as Case Manager Shaune Leeks as Social Worker  Extended Emergency Contact Information Primary Emergency Contact: nyiramutuzo,donatha Mobile Phone: [phone] Relation: Spouse Preferred language: [place]; [place] Interpreter needed? Yes Secondary Emergency Contact: Griffine,Laquisha Mobile Phone: [phone] Relation: Other  Code Status:  Full Code  Goals of care: Advanced Directive information    05/02/2022    8:51 AM  Advanced Directives  Does Patient Have a Medical Advance Directive? No     No chief complaint on file.   HPI:  Pt is a 47 y.o. male seen today for an acute visit for follow up Epigastric pain. He is here with Graylin Shiver Swahili translator. He was here 12/13/2022 started on treatment for H. pylori infection which was positive from previous lab work.  He was started on Flagyl 500 mg twice daily, follow-up advised for reevaluation today.States epigastric pain has improve. Appetite also has improved.  Continues to follow up with wound care center.states still awaiting for disability paper work to completed since he was taken out of work until possible August,2024.He will continue to follow with wound center regarding paper work.    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 daily x6 months with B6 25 mg p.o. daily.  Unsure if patient completed this course.   Past Surgical History:  Procedure Laterality Date   FOOT SURGERY      No Known Allergies  Outpatient Encounter Medications as of 01/04/2023  Medication Sig   buPROPion (WELLBUTRIN SR) 150 MG 12 hr tablet Take 1 tablet (150 mg total) by mouth daily.   pantoprazole (PROTONIX) 40 MG tablet  Take 1 tablet (40 mg total) by mouth 2 (two) times daily for 14 days.   No facility-administered encounter medications on file as of 01/04/2023.    Review of Systems  Constitutional:  Negative for appetite change, chills, fatigue, fever and unexpected weight change.  Respiratory:  Negative for cough, chest tightness, shortness of breath and wheezing.   Cardiovascular:  Negative for chest pain, palpitations and leg swelling.  Gastrointestinal:  Negative for abdominal distention, abdominal pain, blood in stool, constipation, diarrhea, nausea and vomiting.       Epigastric pain has resolved  Skin:  Positive for wound. Negative for color change, pallor and rash.       Follows up with wound center for left lower extremty wound.   Neurological:  Negative for dizziness, weakness, light-headedness and headaches.  Psychiatric/Behavioral:  Negative for agitation, behavioral problems, confusion, hallucinations, self-injury, sleep disturbance and suicidal ideas. The patient is not nervous/anxious.     Immunization History  Administered Date(s) Administered   Tdap 12/01/2022   Pertinent  Health Maintenance Due  Topic Date Due   Colonoscopy  Never done   INFLUENZA VACCINE  03/03/2023      02/27/2021    9:45 AM 05/18/2021   11:06 AM 04/25/2022    2:19 PM 05/01/2022    6:39 PM 05/02/2022    8:49 AM  Fall Risk  Falls in the past year? 0 0     Was there an injury with Fall? 0 0     Fall Risk Category Calculator 0 0  Fall Risk Category (Retired) Low Low     (RETIRED) Patient Fall Risk Level Low fall risk Low fall risk Low fall risk Low fall risk Low fall risk   Functional Status Survey:    Vitals:   01/04/23 1001  BP: 118/80  Pulse: 64  Resp: 20  Temp: 98.6 F (37 C)  SpO2: 96%  Weight: 161 lb 12.8 oz (73.4 kg)  Height: 5\' 9"  (1.753 m)   Body mass index is 23.89 kg/m. Physical Exam Vitals reviewed.  Constitutional:      General: He is not in acute distress.    Appearance: Normal  appearance. He is normal weight. He is not ill-appearing or diaphoretic.  HENT:     Head: Normocephalic.  Eyes:     General: No scleral icterus.       Right eye: No discharge.        Left eye: No discharge.     Conjunctiva/sclera: Conjunctivae normal.     Pupils: Pupils are equal, round, and reactive to light.  Cardiovascular:     Rate and Rhythm: Normal rate and regular rhythm.     Pulses: Normal pulses.     Heart sounds: Normal heart sounds. No murmur heard.    No friction rub. No gallop.  Pulmonary:     Effort: Pulmonary effort is normal. No respiratory distress.     Breath sounds: Normal breath sounds. No wheezing, rhonchi or rales.  Chest:     Chest wall: No tenderness.  Abdominal:     General: Bowel sounds are normal. There is no distension.     Palpations: Abdomen is soft. There is no mass.     Tenderness: There is no abdominal tenderness. There is no right CVA tenderness, left CVA tenderness, guarding or rebound.  Musculoskeletal:        General: No swelling or tenderness. Normal range of motion.     Right lower leg: No edema.     Left lower leg: No edema.  Skin:    General: Skin is warm and dry.     Coloration: Skin is not pale.     Findings: No erythema or rash.     Comments: Left leg dressing intact to below the knee high.wound not visualized during visit.   Neurological:     Mental Status: He is alert and oriented to person, place, and time.     Motor: No weakness.     Gait: Gait normal.  Psychiatric:        Mood and Affect: Mood normal.        Speech: Speech normal.        Behavior: Behavior normal.     Labs reviewed: Recent Labs    04/25/22 1419 05/01/22 2052 05/02/22 0947 12/01/22 1138  NA 140 143  --  142  K 3.6 2.8*  --  3.9  CL 116* 111  --  112*  CO2 19* 23  --  20  GLUCOSE 98 90  --  99  BUN 9 12  --  12  CREATININE 1.07 1.23  --  0.89  CALCIUM 9.4 9.4  --  9.9  MG  --   --  2.2  --    Recent Labs    05/01/22 2052 12/01/22 1138  AST  21 15  ALT 16 13  ALKPHOS 53  --   BILITOT 0.6 0.5  PROT 6.5 7.1  ALBUMIN 3.8  --    Recent Labs    04/25/22 1419 05/01/22  2258 12/01/22 1138  WBC 4.0 4.2 6.6  NEUTROABS  --   --  4,297  HGB 15.3 15.3 16.2  HCT 46.1 45.4 47.3  MCV 100.4* 97.0 98.1  PLT 219 219 259   Lab Results  Component Value Date   TSH 0.793 04/07/2021   No results found for: "HGBA1C" Lab Results  Component Value Date   CHOL 119 12/01/2022   HDL 38 (L) 12/01/2022   LDLCALC 58 12/01/2022   TRIG 146 12/01/2022   CHOLHDL 3.1 12/01/2022    Significant Diagnostic Results in last 30 days:  No results found.  Assessment/Plan 1. H. pylori infection Tested positive for H.pylori 12/01/2022 has completed treatment.Epigastric pain has resolved.No tenderness on exam this visit.  - Advised to reduce protonix 40 mg tablet to one by mouth daily.  - pantoprazole (PROTONIX) 40 MG tablet; Take 1 tablet (40 mg total) by mouth once daily for 14 days.  Dispense: 28 tablet; Refill: 0  2. Tobacco abuse Has taken Bupropion x 1 month has quit smoking.  - Advised to continue Bupropion  - buPROPion (WELLBUTRIN SR) 150 MG 12 hr tablet; Take 1 tablet (150 mg total) by mouth daily.  Dispense: 90 tablet; Refill: 1  Family/ staff Communication: Reviewed plan of care with patient verbalized understanding  Labs/tests ordered: None   Next Appointment: Return in about 3 months (around 04/06/2023) for medical mangement of chronic issues.[name], NP

## 2023-01-05 ENCOUNTER — Ambulatory Visit: Payer: Medicaid Other | Admitting: Family

## 2023-01-11 ENCOUNTER — Other Ambulatory Visit: Payer: Medicaid Other

## 2023-01-11 NOTE — Patient Outreach (Signed)
  Medicaid Managed Care   Unsuccessful Outreach Note  01/11/2023 Name: [name] MRN: [id number] DOB: 05/11/1976  Referred by: [name], [name], NP Reason for referral : High Risk Managed Medicaid (MM Social work unsuccessful telephone outreach)   An unsuccessful telephone outreach was attempted today. The patient was referred to the case management team for assistance with care management and care coordination.   Follow Up Plan: The patient has been provided with contact information for the care management team and has been advised to call with any health related questions or concerns.   Nuri Branca, BSW, MHA [place]  Managed Medicaid Social Worker [phone]   

## 2023-01-11 NOTE — Patient Instructions (Signed)
  Medicaid Managed Care   Unsuccessful Outreach Note  01/11/2023 Name: [name] MRN: [id number] DOB: 05-05-1976  Referred by: [name], NP Reason for referral : High Risk Managed Medicaid (MM Social work unsuccessful telephone outreach)   An unsuccessful telephone outreach was attempted today. The patient was referred to the case management team for assistance with care management and care coordination.   Follow Up Plan: The patient has been provided with contact information for the care management team and has been advised to call with any health related questions or concerns.   Abelino Derrick, MHA [facility]  Managed [facility] Social Worker [phone]

## 2023-01-13 ENCOUNTER — Encounter (HOSPITAL_BASED_OUTPATIENT_CLINIC_OR_DEPARTMENT_OTHER): Payer: Medicaid Other | Admitting: General Surgery

## 2023-01-13 DIAGNOSIS — I872 Venous insufficiency (chronic) (peripheral): Secondary | ICD-10-CM | POA: Diagnosis not present

## 2023-01-13 DIAGNOSIS — K2950 Unspecified chronic gastritis without bleeding: Secondary | ICD-10-CM | POA: Diagnosis not present

## 2023-01-13 DIAGNOSIS — M8580 Other specified disorders of bone density and structure, unspecified site: Secondary | ICD-10-CM | POA: Diagnosis not present

## 2023-01-13 DIAGNOSIS — I87332 Chronic venous hypertension (idiopathic) with ulcer and inflammation of left lower extremity: Secondary | ICD-10-CM | POA: Diagnosis not present

## 2023-01-13 DIAGNOSIS — L97322 Non-pressure chronic ulcer of left ankle with fat layer exposed: Secondary | ICD-10-CM | POA: Diagnosis not present

## 2023-01-13 DIAGNOSIS — F17200 Nicotine dependence, unspecified, uncomplicated: Secondary | ICD-10-CM | POA: Diagnosis not present

## 2023-01-13 DIAGNOSIS — L97328 Non-pressure chronic ulcer of left ankle with other specified severity: Secondary | ICD-10-CM | POA: Diagnosis not present

## 2023-01-15 NOTE — Progress Notes (Signed)
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 contract and is quite [name] bit smaller. The periwound skin is in good condition and he has epithelialized even more of the previously open portions of his wound. TheraSkin #5 (the last 1) is scheduled for today. 12/25/2021: The wound continues to improve dramatically. He had his last application of TheraSkin 2 weeks ago. The periwound skin is in good condition and there is evidence of substantial epithelialization. 01/11/2022: The patient did not make his appointment last week. T oday, the  anterior portion of the wound is nearly closed with just [name] thin layer of eschar overlying the surface. The more lateral part is quite [name] bit smaller. Although the surface remains gritty and fibrous, it continues to epithelialize. 01/20/2022: The more distal and anterior portion of the wound has closed completely. The more lateral and proximal part is substantially smaller. There is some slough on the wound surface, but overall things continue to improve nicely. 01/28/2022: The wound continues to contract. There is [name] little bit of slough accumulation on the wound surface, but there is extensive perimeter epithelialization. 02/19/2022: It has been 3 weeks since he came to clinic due to various conflicts. His 3 layer compression wrap remained in situ for that entire period. As [name] result, there has been some tissue breakdown secondary to moisture. The wound is [name] little bit larger but fortunately there has not been [name] tremendous deterioration. There is some slough on the wound surface. No significant drainage or odor. 03/23/2022: It has been [name] month since his last visit. He has had the same 3 layer compression wrap in situ since that time. 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 Remarkably, the wound is [name] little bit smaller today with just [name] layer of slough on the surface. 03/29/2022: His wound measured slightly larger today. There is slough accumulation on the surface. It also looks as though his footwear is rubbing on his foot and may be also causing some friction at the ankle where his wound is. 04/07/2022: The wound was [name] little bit narrower today. He continues to have slough overlying [name] somewhat fibrotic surface. It appears  that he has rectified the situation with his foot wear and I do not see any further evidence of friction trauma. 04/15/2022: No significant change to his wound today. There is still slough on [name] fibrous surface. 04/22/2022: The wound measured slightly smaller  today. The surface is much cleaner and has [name] more robust pinkred color. It is still fairly fibrotic. 05/17/2022: The patient has not been in clinic for nearly 4 weeks. His wrap has remained in place the entire time. The wound measures [name] little bit smaller today. There is slough accumulation. It remains fibrotic. 05/25/2022: The wound surface is improved, with less fibrosis and [name] more pink color. There is slough on the surface with some periwound eschar. 06/01/2022: The wound is [name] little bit smaller again today and the surface continues to improve. Still with slough buildup, but no concern for infection. 06/21/2022: The patient was absent from clinic the past couple of weeks. He returns today and the wound seems to have deteriorated somewhat. Through his interpreter, he reports that at his job, he stands basically immobile for prolonged periods of time and his feet and legs are constantly wet, meaning the wound is wet throughout his work shifts. He is unable to get [name] waterproof boot over his leg due to the inflexibility of his ankle. 06/29/2022: The wound is about the same size, but it is quite clean and the surface has more of [name] pink color. His employment has told him that they cannot Byrd, [name] ([id number]) [id number] Page 10 of 13 make any further accommodations for him. 07/06/2022: The wound is smaller this week. There is [name] light layer of slough on the surface, but the drainage on his dressing has the typical blue-green color of Pseudomonas. 12/12; this is [name] patient to be admitted earlier in the year with [name] extensive wound across the left lateral ankle and into the anterior ankle. Initially [name] immigrant from the [place]. He speaks [place] leads and we interviewed him through [name] interpreter. He has been using endoform on the remanent of the wound. Intake nurse tells [place] that we have healed this out but it reopens. He is no longer working 07/21/2022: The wound is  smaller today. There is slough on the surface, but good granulation tissue underlying. 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 08/06/2022: The wound is [name] little bit smaller again today. There is thick slough on the surface, but the surface underneath is less fibrotic. 08/31/2022: The patient has missed [name] couple of appointments. T oday, his foot and leg are completely macerated. He says that he has been using [name] plastic bag for showers and that it leaked. The wound is larger, has [name] thick layer of slough on the surface and is malodorous. 09/07/2022: The wound looks better this week. It is smaller with some slough on the surface. [name] small satellite wound has opened up just proximal to the main site. 09/15/2022: The wound has [name] lot more slough on it this week and the satellite wound is larger. He says that he has been standing quite [name] bit at work. 09/22/2022: The satellite lesion is about the same size but much more superficial. The main wound is [name] also about the same size, but has [name] healthier-looking surface. There is slough accumulation on both surfaces. 09/29/2022: The main wound is smaller. The satellite lesion is about the same size. Both have slough on the surface. 10/06/2022: Both wounds are smaller. There is slough and eschar buildup in both sites. 3/13;  patient presents for follow-up. We have been using antibiotic ointment with endoform under 3 layer compression. Patient has no issues or complaints today. 10/21/2022: The wound is smaller today. Both the main wound and satellite lesion are about [name] third smaller than the last time I saw them. There is some slough on both surfaces. 10/29/2022: The satellite lesion has closed. The main wound is smaller again today with just some slough and eschar present. 11/05/2022: The satellite lesion unfortunately reopened, but it is quite small and superficial under some eschar. The main wound continues to contract. There is slough on the surface, but  epithelium is creeping down the sides of the divot. 11/12/2022: The satellite lesion has closed again. The main wound is about the same size, but the surface appearance is more pink and viable. 11/18/2022: The wound is [name] little smaller today. There is slough on the surface. 11/25/2022: The wound continues to contract. It appears to be epithelializing down the sides of the divot. There is slough on the surface. 12/02/2022: Although the wound measured about the same today, visually it appears smaller. There is slough on the surface and the tissue underneath appears healthier. 12/09/2022: Once again, the wound measurements were not significantly different, but visually it continues to appear smaller each week. There is some slough on the surface but there is epithelium filling in down the edges of the indentation of the wound. 12/16/2022: The wound is smaller by about one third today. There is [name] little bit of slough on the surface, but no other debris accumulation. 12/29/2022: The wound is smaller and shallower today. There is light slough on the surface. 01/03/2023: The wound is smaller again today. There is [name] little slough on the surface, but the granulation tissue underneath is looking healthy. 01/13/2023: Continued improvement and contraction of the wound. There is more epithelialization and and very little slough on the surface. Patient History Information obtained from Patient. Family History Unknown History. Social History Current every day smoker, Marital Status - Married, Alcohol Use - Rarely, Drug Use - No History, Caffeine Use - Moderate. Medical [name] Surgical History Notes nd Gastrointestinal Chronic Gastritis Objective Constitutional no acute distress. Vitals Time Taken: 9:20 AM, Height: 69 in, Weight: 170 lbs, BMI: 25.1, Temperature: 98.7 F, Pulse: 62 bpm, Respiratory Rate: 18 breaths/min, Blood Pressure: 115/69 mmHg. Ferriss, [name] ([id number]) [id number] Page  11 of 13 Respiratory Normal work of breathing on room air. General Notes: 01/13/2023: Continued improvement and contraction of the wound. There is more epithelialization and and very little slough on the surface. Integumentary (Hair, Skin) Wound #1 status is Open. Original cause of wound was Trauma. The date acquired was: 10/14/2020. The wound has been in treatment 101 weeks. The wound is located on the Left Ankle. The wound measures 0.3cm length x 0.6cm width x 0.1cm depth; 0.141cm^2 area and 0.014cm^3 volume. There is Fat Layer (Subcutaneous Tissue) exposed. There is no tunneling or undermining noted. There is [name] small amount of serous drainage noted. The wound margin is flat and intact. There is large (67-100%) pink granulation within the wound bed. There is [name] small (1-33%) amount of necrotic tissue within the wound bed including Adherent Slough. The periwound skin appearance had no abnormalities noted for moisture. The periwound skin appearance had no abnormalities noted for color. The periwound skin appearance exhibited: Scarring. Periwound temperature was noted as No Abnormality. Assessment Active Problems ICD-10 Non-pressure chronic ulcer of left ankle with other specified severity Chronic venous hypertension (idiopathic) with ulcer and inflammation of  left lower extremity Procedures Wound #1 Pre-procedure diagnosis of Wound #1 is [name] Venous Leg Ulcer located on the Left Ankle .Severity of Tissue Pre Debridement is: Fat layer exposed. There was [name] Selective/Open Wound Non-Viable Tissue Debridement with [name] total area of 0.14 sq cm performed by Duanne Guess, MD. With the following instrument(s): Curette to remove Non-Viable tissue/material. Material removed includes [facility] after achieving pain control using Lidocaine 4% T opical Solution. No specimens were taken. [name] time out was conducted at 09:40, prior to the start of the procedure. [name] Minimum amount of bleeding was controlled with Pressure.  The procedure was tolerated well with [name] pain level of 0 throughout and [name] pain level of 0 following the procedure. Post Debridement Measurements: 0.3cm length x 0.6cm width x 0.1cm depth; 0.014cm^3 volume. Character of Wound/Ulcer Post Debridement is improved. Severity of Tissue Post Debridement is: Fat layer exposed. Post procedure Diagnosis Wound #1: Same as Pre-Procedure General Notes: scribed for Dr. Lady Gary by Zenaida Deed, RN. Pre-procedure diagnosis of Wound #1 is [name] Venous Leg Ulcer located on the Left Ankle . There was [name] Double Layer Compression Therapy Procedure by Zenaida Deed, RN. Post procedure Diagnosis Wound #1: Same as Pre-Procedure Notes: urgo lite. Plan Follow-up Appointments: Return Appointment in 1 week. - Dr. Lady Gary - RM 3 Friday 6/21 @ 11:30 am per [name] Other: - [place] interpreter required. Anesthetic: (In clinic) Topical Lidocaine 4% applied to wound bed Bathing/ Shower/ Hygiene: Other Bathing/Shower/Hygiene Orders/Instructions: - Please use [name] " CAST [employer]ECTOR" when showering. This can be purchased from [employer], Medical supply store,[employer]. Cost is approximately $14-27. Edema Control - Lymphedema / SCD / Other: Elevate legs to the level of the heart or above for 30 minutes daily and/or when sitting for 3-4 times [name] day throughout the day. Avoid standing for long periods of time. Exercise regularly WOUND #1: - Ankle Wound Laterality: Left Cleanser: Soap and Water 1 x Per Week/30 Days Discharge Instructions: May shower and wash wound with dial antibacterial soap and water prior to dressing change. Cleanser: Vashe 5.8 (oz) 1 x Per Week/30 Days Discharge Instructions: Cleanse the wound with Vashe prior to applying [name] clean dressing using gauze sponges, not tissue or cotton balls. Peri-Wound Care: Triamcinolone 15 (g) 1 x Per Week/30 Days Discharge Instructions: Use triamcinolone 15 (g) as directed Peri-Wound Care: Sween Lotion (Moisturizing lotion) 1 x Per  Week/30 Days Discharge Instructions: Apply moisturizing lotion as directed Topical: Gentamicin 1 x Per Week/30 Days Discharge Instructions: As directed by physician Prim Dressing: Endoform 2x2 in 1 x Per Week/30 Days ary Discharge Instructions: Moisten with saline Secondary Dressing: Drawtex 4x4 in 1 x Per Week/30 Days Discharge Instructions: Apply over primary dressing as directed. Secondary Dressing: Woven Gauze Sponge, Non-Sterile 4x4 in 1 x Per Week/30 Days Discharge Instructions: Apply over primary dressing as directed. Com pression Wrap: Urgo K2 Lite, (equivalent to [name] 3 layer) two layer compression system, regular 1 x Per Week/30 Days Discharge Instructions: Apply Urgo K2 Lite as directed (alternative to 3 layer compression). Bartnik, [name] ([id number]) [id number] Page 12 of 13 01/13/2023: Continued improvement and contraction of the wound. There is more epithelialization and and very little slough on the surface. I used [name] curette to debride the slough from the wound surface. We will continue topical gentamicin with endoform and 3 layer compression/equivalent. Follow- up in 1 week. Electronic Signature(s) Signed: 01/13/2023 10:11:40 AM By: [name] FACS Entered By: Duanne Guess on 01/13/2023 10:11:40 -------------------------------------------------------------------------------- HxROS Details Patient Name: Date of Service: [name]  [name], [name] [name] 01/13/2023 9:00 [name] M Medical Record Number: [id number] Patient Account Number: [id number] Date of Birth/Sex: Treating RN: [birth year] (47 y.o. M) Primary Care Provider: [name] Other Clinician: Referring Provider: Treating Provider/Extender: Jason Nest, Dinah Weeks in Treatment: 101 Information Obtained From Patient Gastrointestinal Medical History: Past Medical History Notes: Chronic Gastritis Immunizations Pneumococcal Vaccine: Received Pneumococcal Vaccination:  No Implantable Devices No devices added Family and Social History Unknown History: Yes; Current every day smoker; Marital Status - Married; Alcohol Use: Rarely; Drug Use: No History; Caffeine Use: Moderate; Financial Concerns: No; Food, Clothing or Shelter Needs: No; Support System Lacking: No; Transportation Concerns: No Electronic Signature(s) Signed: 01/13/2023 12:40:38 PM By: [name] FACS Entered By: Duanne Guess on 01/13/2023 10:09:54 -------------------------------------------------------------------------------- SuperBill Details Patient Name: Date of Service: [name] 01/13/2023 Medical Record Number: [id number] Patient Account Number: [id number] Date of Birth/Sex: Treating RN: 11-22-1975 (47 y.o. M) Primary Care Provider: [name] Other Clinician: Referring Provider: Treating Provider/Extender: Jason Nest, Dinah Weeks in Treatment: 101 Diagnosis Coding Wyrick, [name] ([id number]) [id number] Page 13 of 13 ICD-10 Codes Code Description [phone] Non-pressure chronic ulcer of left ankle with other specified severity I87.332 Chronic venous hypertension (idiopathic) with ulcer and inflammation of left lower extremity Facility Procedures : CPT4 Code: [id number] Description: 97597 - DEBRIDE WOUND 1ST 20 SQ CM OR < ICD-10 Diagnosis Description L97.328 Non-pressure chronic ulcer of left ankle with other specified severity Modifier: Quantity: 1 Physician Procedures : CPT4 Code Description Modifier [id number] - WC PHYS LEVEL 4 - EST PT 25 ICD-10 Diagnosis Description L97.328 Non-pressure chronic ulcer of left ankle with other specified severity I87.332 Chronic venous hypertension (idiopathic) with ulcer and  inflammation of left lower extremity Quantity: 1 : [id number] 97597 - WC PHYS DEBR WO ANESTH 20 SQ CM ICD-10 Diagnosis Description L97.328 Non-pressure chronic ulcer of left ankle with other specified  severity Quantity: 1 Electronic Signature(s) Signed: 01/13/2023 10:11:55 AM By: [name] FACS Entered By: Duanne Guess on 01/13/2023 10:11:54

## 2023-01-15 NOTE — Progress Notes (Signed)
[name], [name] ([id number]) [id number] Page 1 of 8 Visit Report for 01/13/2023 Arrival Information Details Patient Name: Date of Service: [name] [place] NIE 01/13/2023 9:00 A M Medical Record Number: [id number] Patient Account Number: [id number] Date of Birth/Sex: Treating RN: Dec 04, 1975 (47 y.o. [name] Primary Care [name]: [name] Other Clinician: Referring Brysen Shankman: Treating [name]/Extender: [name] in Treatment: 101 Visit Information History Since Last Visit Added or deleted any medications: No Patient Arrived: Ambulatory Any new allergies or adverse reactions: No Arrival Time: 09:19 Had a fall or experienced change in No Accompanied By: interpreter activities of daily living that may affect Transfer Assistance: None risk of falls: Patient Identification Verified: Yes Signs or symptoms of abuse/neglect since last visito No Secondary Verification Process Completed: Yes Hospitalized since last visit: No Patient Requires Transmission-Based Precautions: No Implantable device outside of the clinic excluding No Patient Has Alerts: No cellular tissue based products placed in the center since last visit: Has Dressing in Place as Prescribed: Yes Has Compression in Place as Prescribed: Yes Pain Present Now: No Electronic Signature(s) Signed: 01/13/2023 4:48:54 PM By: [name] Deed RN, BSN Entered By: [name] on 01/13/2023 09:20:24 -------------------------------------------------------------------------------- Compression Therapy Details Patient Name: Date of Service: [name] 01/13/2023 9:00 A M Medical Record Number: [id number] Patient Account Number: [id number] Date of Birth/Sex: Treating RN: Mar 13, 1976 (47 y.o. [name] Primary Care [name]: [name] Other Clinician: Referring [name]: Treating [name]/Extender: [name], [name] in  Treatment: 101 Compression Therapy Performed for Wound Assessment: Wound #1 Left Ankle Performed By: Clinician [name] Deed, RN Compression Type: Double Layer Post Procedure Diagnosis Same as Pre-procedure Notes urgo lite Electronic Signature(s) Signed: 01/13/2023 4:48:54 PM By: [name] Deed RN, BSN Entered By: [name] on 01/13/2023 09:33:43 [name], [name] ([id number]) [id number] Page 2 of 8 -------------------------------------------------------------------------------- Encounter Discharge Information Details Patient Name: Date of Service: [name] 01/13/2023 9:00 A M Medical Record Number: [id number] Patient Account Number: [id number] Date of Birth/Sex: Treating RN: 10/01/1975 (47 y.o. [name] Primary Care [name]: [name] Other Clinician: Referring [name]: Treating [name]/Extender: [name] in Treatment: [phone] Encounter Discharge Information Items Post Procedure Vitals Discharge Condition: Stable Temperature (F): 98.7 Ambulatory Status: Ambulatory Pulse (bpm): 62 Discharge Destination: Home Respiratory Rate (breaths/min): 18 Transportation: Private Auto Blood Pressure (mmHg): 115/69 Accompanied By: interpreter Schedule Follow-up Appointment: Yes Clinical Summary of Care: Patient Declined Electronic Signature(s) Signed: 01/13/2023 4:48:54 PM By: [name] Deed RN, BSN Entered By: [name] on 01/13/2023 09:57:00 -------------------------------------------------------------------------------- Lower Extremity Assessment Details Patient Name: Date of Service: [name] 01/13/2023 9:00 A M Medical Record Number: [id number] Patient Account Number: [id number] Date of Birth/Sex: Treating RN: 08/11/1975 (47 y.o. [name] Primary Care Yahshua Thibault: [name] Other Clinician: Referring Eain Mullendore: Treating [name]/Extender: [name],  [name] in Treatment: 101 Edema Assessment Assessed: [Left: No] [Right: No] Edema: [Left: Ye] [Right: s] Calf Left: Right: Point of Measurement: 28 cm From Medial Instep 30 cm Ankle Left: Right: Point of Measurement: 8 cm From Medial Instep 21 cm Vascular Assessment Pulses: Dorsalis Pedis Palpable: [Left:No] Electronic Signature(s) Signed: 01/13/2023 4:48:54 PM By: [name] Deed RN, BSN Entered By: [name] on 01/13/2023 09:28:06 [name], [name] ([id number]) [id number] Page 3 of 8 -------------------------------------------------------------------------------- Multi Wound Chart Details Patient Name: Date of Service: [name] 01/13/2023 9:00 A M Medical Record Number: [id number] Patient Account Number: [id number] Date of Birth/Sex: Treating RN: 11-20-1975 (47 y.o.  M) Primary Care [name]: [name] Other Clinician: Referring Danen Lapaglia: Treating [name]/Extender: [name], [name] in Treatment: 101 Vital Signs Height(in): 69 Pulse(bpm): 62 Weight(lbs): 170 Blood Pressure(mmHg): 115/69 Body Mass Index(BMI): 25.1 Temperature(F): 98.7 Respiratory Rate(breaths/min): 18 [1:Photos:] [N/A:N/A] Left Ankle N/A N/A Wound Location: Trauma N/A N/A Wounding Event: Venous Leg Ulcer N/A N/A Primary Etiology: 10/14/2020 N/A N/A Date Acquired: 101 N/A N/A [name] of Treatment: Open N/A N/A Wound Status: No N/A N/A Wound Recurrence: 0.3x0.6x0.1 N/A N/A Measurements L x W x D (cm) 0.141 N/A N/A A (cm) : rea 0.014 N/A N/A Volume (cm) : 99.80% N/A N/A % Reduction in A rea: 99.90% N/A N/A % Reduction in Volume: Full Thickness Without Exposed N/A N/A Classification: Support Structures Small N/A N/A Exudate A mount: Serous N/A N/A Exudate Type: amber N/A N/A Exudate Color: Flat and Intact N/A N/A Wound Margin: Large (67-100%) N/A N/A Granulation A mount: Pink N/A N/A Granulation  Quality: Small (1-33%) N/A N/A Necrotic A mount: Fat Layer (Subcutaneous Tissue): Yes N/A N/A Exposed Structures: Fascia: No Tendon: No Muscle: No Joint: No Bone: No Large (67-100%) N/A N/A Epithelialization: Debridement - Selective/Open Wound N/A N/A Debridement: Pre-procedure Verification/Time Out 09:40 N/A N/A Taken: Lidocaine 4% Topical Solution N/A N/A Pain Control: Slough N/A N/A Tissue Debrided: Non-Viable Tissue N/A N/A Level: 0.14 N/A N/A Debridement A (sq cm): rea Curette N/A N/A Instrument: Minimum N/A N/A Bleeding: Pressure N/A N/A Hemostasis A chieved: 0 N/A N/A Procedural Pain: 0 N/A N/A Post Procedural Pain: Procedure was tolerated well N/A N/A Debridement Treatment Response: 0.3x0.6x0.1 N/A N/A Post Debridement Measurements L x W x D (cm) 0.014 N/A N/A Post Debridement Volume: (cm) Scarring: Yes N/A N/A Periwound Skin Texture: Maceration: No N/A N/A Periwound Skin Moisture: Dry/Scaly: No Zahniser, [name] ([id number]) [phone].pdf Page 4 of 8 No Abnormalities Noted N/A N/A Periwound Skin Color: No Abnormality N/A N/A Temperature: Compression Therapy N/A N/A Procedures Performed: Debridement Treatment Notes Wound #1 (Ankle) Wound Laterality: Left Cleanser Soap and Water Discharge Instruction: May shower and wash wound with dial antibacterial soap and water prior to dressing change. Vashe 5.8 (oz) Discharge Instruction: Cleanse the wound with Vashe prior to applying a clean dressing using gauze sponges, not tissue or cotton balls. Peri-Wound Care Triamcinolone 15 (g) Discharge Instruction: Use triamcinolone 15 (g) as directed Sween Lotion (Moisturizing lotion) Discharge Instruction: Apply moisturizing lotion as directed Topical Gentamicin Discharge Instruction: As directed by physician Primary Dressing Endoform 2x2 in Discharge Instruction: Moisten with saline Secondary Dressing Drawtex 4x4 in Discharge  Instruction: Apply over primary dressing as directed. Woven Gauze Sponge, Non-Sterile 4x4 in Discharge Instruction: Apply over primary dressing as directed. Secured With Compression Wrap Urgo K2 Lite, (equivalent to a 3 layer) two layer compression system, regular Discharge Instruction: Apply Urgo K2 Lite as directed (alternative to 3 layer compression). Compression Stockings Add-Ons Electronic Signature(s) Signed: 01/13/2023 10:08:16 AM By: [name] FACS Entered By: Duanne Guess on 01/13/2023 10:08:16 -------------------------------------------------------------------------------- Multi-Disciplinary Care Plan Details Patient Name: Date of Service: [name] 01/13/2023 9:00 A M Medical Record Number: [id number] Patient Account Number: [id number] Date of Birth/Sex: Treating RN: 06-01-1976 (47 y.o. [name] Primary Care Mercer Peifer: [name] Other Clinician: Referring Schneider Warchol: Treating Jove Beyl/Extender: [name] in Treatment: [phone] Multidisciplinary Care Plan reviewed with physician Active Inactive Venous Leg Ulcer [name], [name] ([id number]) [phone].pdf Page 5 of 8 Nursing Diagnoses: Actual venous Insuffiency (use after diagnosis is confirmed) Knowledge deficit related to disease process and management  Goals: Patient will maintain optimal edema control Date Initiated: 02/19/2022 Target Resolution Date: 01/29/2023 Goal Status: Active Interventions: Assess peripheral edema status every visit. Compression as ordered Treatment Activities: Therapeutic compression applied : 02/19/2022 Notes: Wound/Skin Impairment Nursing Diagnoses: Knowledge deficit related to ulceration/compromised skin integrity Goals: Patient/caregiver will verbalize understanding of skin care regimen Date Initiated: 02/05/2021 Target Resolution Date: 01/29/2023 Goal Status: Active Interventions: Assess  patient/caregiver ability to obtain necessary supplies Assess patient/caregiver ability to perform ulcer/skin care regimen upon admission and as needed Provide education on ulcer and skin care Treatment Activities: Skin care regimen initiated : 02/05/2021 Topical wound management initiated : 02/05/2021 Notes: 03/31/21: Wound care regimen ongoing, target date extended. 04/21/21: Wound care ongoing, through interpreter patient states he is doing fine with his dressing changes. Electronic Signature(s) Signed: 01/13/2023 4:48:54 PM By: [name] Deed RN, BSN Entered By: [name] on 01/13/2023 09:31:27 -------------------------------------------------------------------------------- Pain Assessment Details Patient Name: Date of Service: [name] 01/13/2023 9:00 A M Medical Record Number: [id number] Patient Account Number: [id number] Date of Birth/Sex: Treating RN: [birth year] (47 y.o. [name] Primary Care Mamadou Breon: [name] Other Clinician: Referring Jaree Trinka: Treating Aizlyn Schifano/Extender: [name], [name] in Treatment: [phone] Active Problems Location of Pain Severity and Description of Pain Patient Has Paino No Site Locations Rate the pain. Hancock, [name] ([id number]) [id number] Page 6 of 8 Rate the pain. Current Pain Level: 0 Pain Management and Medication Current Pain Management: Electronic Signature(s) Signed: 01/13/2023 4:48:54 PM By: [name] Deed RN, BSN Entered By: [name] on 01/13/2023 09:21:08 -------------------------------------------------------------------------------- Patient/Caregiver Education Details Patient Name: Date of Service: [name] 6/13/2024andnbsp9:00 A M Medical Record Number: [id number] Patient Account Number: [id number] Date of Birth/Gender: Treating RN: 1975-12-07 (47 y.o. [name] Primary Care Physician: [name] Other  Clinician: Referring Physician: Treating Physician/Extender: [name] in Treatment: [phone] Education Assessment Education Provided To: Patient Education Topics Provided Venous: Methods: Explain/Verbal Responses: Reinforcements needed, State content correctly Wound/Skin Impairment: Methods: Explain/Verbal Responses: Reinforcements needed, State content correctly Electronic Signature(s) Signed: 01/13/2023 4:48:54 PM By: [name] Deed RN, BSN Entered By: [name] on 01/13/2023 09:31:49 -------------------------------------------------------------------------------- Wound Assessment Details Patient Name: Date of Service: [name] 01/13/2023 9:00 A M [name], [name] ([id number]) [id number] Page 7 of 8 Medical Record Number: [id number] Patient Account Number: [id number] Date of Birth/Sex: Treating RN: 1975-12-30 (47 y.o. [name] Primary Care Nakiea Metzner: [name] Other Clinician: Referring Kaileb Monsanto: Treating Claudina Oliphant/Extender: [name], [name] in Treatment: 101 Wound Status Wound Number: 1 Primary Etiology: Venous Leg Ulcer Wound Location: Left Ankle Wound Status: Open Wounding Event: Trauma Date Acquired: 10/14/2020 [name] Of Treatment: 101 Clustered Wound: No Photos Wound Measurements Length: (cm) 0.3 Width: (cm) 0.6 Depth: (cm) 0.1 Area: (cm) 0.141 Volume: (cm) 0.014 % Reduction in Area: 99.8% % Reduction in Volume: 99.9% Epithelialization: Large (67-100%) Tunneling: No Undermining: No Wound Description Classification: Full Thickness Without Exposed Support Structures Wound Margin: Flat and Intact Exudate Amount: Small Exudate Type: Serous Exudate Color: amber Foul Odor After Cleansing: No Slough/Fibrino Yes Wound Bed Granulation Amount: Large (67-100%) Exposed Structure Granulation Quality: Pink Fascia Exposed: No Necrotic Amount: Small (1-33%) Fat  Layer (Subcutaneous Tissue) Exposed: Yes Necrotic Quality: Adherent Slough Tendon Exposed: No Muscle Exposed: No Joint Exposed: No Bone Exposed: No Periwound Skin Texture Texture Color No Abnormalities Noted: No No Abnormalities Noted: Yes Scarring: Yes Temperature / Pain Temperature: No Abnormality Moisture No Abnormalities Noted: Yes Treatment Notes Wound #1 (Ankle) Wound  Laterality: Left Cleanser Soap and Water Discharge Instruction: May shower and wash wound with dial antibacterial soap and water prior to dressing change. Vashe 5.8 (oz) Discharge Instruction: Cleanse the wound with Vashe prior to applying a clean dressing using gauze sponges, not tissue or cotton balls. Peri-Wound Care Triamcinolone 15 (g) Discharge Instruction: Use triamcinolone 15 (g) as directed Sween Lotion (Moisturizing lotion) [name], [name] ([id number]) [phone].pdf Page 8 of 8 Discharge Instruction: Apply moisturizing lotion as directed Topical Gentamicin Discharge Instruction: As directed by physician Primary Dressing Endoform 2x2 in Discharge Instruction: Moisten with saline Secondary Dressing Drawtex 4x4 in Discharge Instruction: Apply over primary dressing as directed. Woven Gauze Sponge, Non-Sterile 4x4 in Discharge Instruction: Apply over primary dressing as directed. Secured With Compression Wrap Urgo K2 Lite, (equivalent to a 3 layer) two layer compression system, regular Discharge Instruction: Apply Urgo K2 Lite as directed (alternative to 3 layer compression). Compression Stockings Add-Ons Electronic Signature(s) Signed: 01/13/2023 4:48:54 PM By: [name] Deed RN, BSN Entered By: [name] on 01/13/2023 09:29:59 -------------------------------------------------------------------------------- Vitals Details Patient Name: Date of Service: [name], [name] 01/13/2023 9:00 A M Medical Record Number: [id number] Patient Account Number:  [id number] Date of Birth/Sex: Treating RN: 13-Dec-1975 (47 y.o. [name] Primary Care Gladie Gravette: [name] Other Clinician: Referring Noreene Boreman: Treating Nakiesha Rumsey/Extender: [name], [name] in Treatment: 101 Vital Signs Time Taken: 09:20 Temperature (F): 98.7 Height (in): 69 Pulse (bpm): 62 Weight (lbs): 170 Respiratory Rate (breaths/min): 18 Body Mass Index (BMI): 25.1 Blood Pressure (mmHg): 115/69 Reference Range: 80 - 120 mg / dl Electronic Signature(s) Signed: 01/13/2023 4:48:54 PM By: [name] Deed RN, BSN Entered By: [name] on 01/13/2023 09:21:26

## 2023-01-17 ENCOUNTER — Other Ambulatory Visit: Payer: Medicaid Other | Admitting: *Deleted

## 2023-01-17 ENCOUNTER — Encounter: Payer: Self-pay | Admitting: *Deleted

## 2023-01-17 NOTE — Patient Outreach (Signed)
Medicaid Managed Care   Nurse Care Manager Note  01/17/2023 Name:  [name] MRN:  [id number] DOB:  [birth year]  [name] is an 47 y.o. year old male who is a primary patient of [name], [name], NP.  The [facility] Managed Care Coordination team was consulted for assistance with:    Wound care  [name] was given information about Medicaid Managed Care Coordination team services today. [name] Patient agreed to services and verbal consent obtained.  Engaged with patient by telephone for follow up visit in response to provider referral for case management and/or care coordination services.   Assessments/Interventions:  Review of past medical history, allergies, medications, health status, including review of consultants reports, laboratory and other test data, was performed as part of comprehensive evaluation and provision of chronic care management services.  SDOH (Social Determinants of Health) assessments and interventions performed: SDOH Interventions    Flowsheet Row Patient Outreach Telephone from 10/04/2022 in [place] [facility] DEPARTMENT Patient Outreach Telephone from 07/27/2022 in  POPULATION HEALTH DEPARTMENT  SDOH Interventions    Food Insecurity Interventions -- Intervention Not Indicated  Housing Interventions -- Other (Comment)  [BSW referral, scheduled on 07/28/22 at 3pm]  Transportation Interventions -- Intervention Not Indicated  Financial Strain Interventions Other (Comment)  [referral to BSW] --       Care Plan  No Known Allergies  Medications Reviewed Today     Reviewed by Heidi Dach, RN (Registered Nurse) on 01/17/23 at 1309  Med List Status: <None>   Medication Order Taking? Sig Documenting Provider Last Dose Status Informant  buPROPion (WELLBUTRIN SR) 150 MG 12 hr tablet [id number] Yes Take 1 tablet (150 mg total) by mouth daily. [name], [name], NP Taking Active   pantoprazole (PROTONIX) 40 MG  tablet [id number] Yes Take 1 tablet (40 mg total) by mouth daily. 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   Chronic gastritis 04/09/2021   Refugee health examination 04/07/2021   Unable to read or write in [place]iny[place] or English  04/07/2021   Tobacco abuse 04/07/2021   Hematuria 04/07/2021   Medication monitoring encounter 03/18/2021   Skin ulcer (HCC) 02/17/2021    Conditions to be addressed/monitored per PCP order:   wound care  Care Plan : RN Care Manager Plan of Care  Updates made by Heidi Dach, RN since 01/17/2023 12:00 AM     Problem: Health Management needs related to Wound Care      Long-Range Goal: Development of Plan of Care to address Health Management needs related to Wound Care   Start Date: 07/27/2022  Expected End Date: 03/21/2023  Priority: High  Note:   Current Barriers:  Knowledge Deficits related to plan of care for management of Wound Management  Financial Constraints.   RNCM Clinical Goal(s):  Patient will verbalize understanding of plan for management of Wound Care as evidenced by patient reports attend all scheduled medical appointments: reschedule missed visit with Wound Care as evidenced by provider EMR documentation        work with social worker to address Financial constraints related to housing related to the management of Wound Care as evidenced by review of EMR and patient or social worker report       Interventions: Inter-disciplinary care team collaboration (see longitudinal plan of care) Evaluation of current treatment plan related to  self management and patient's adherence to plan as established by provider    H. Pylori  (Status:  Goal Met.)  Long Term Goal Evaluation of current treatment plan related to  H. Pylori ,  language barrier  self-management and patient's adherence to plan as established by provider. Discussed plans with patient for  ongoing care management follow up and provided patient with direct contact information for care management team Provided education to patient re: H. Pylori Reviewed medications with patient and discussed the importance of getting all medications and taking as directed, instructed not to drink alcohol while taking these medications Collaborated with BSW regarding resending requested resources, RNCM updated address in Epic Collaborated with [facility] to request transferring medications, delivery and starting a charge account [name] interpreter [phone] during this entire visit   Wound Care  (Status: Goal on Track (progressing): YES.) Long Term Goal  Evaluation of current treatment plan related to  Wound Care , Financial constraints related to affording housing  self-management and patient's adherence to plan as established by provider. Discussed plans with patient for ongoing care management follow up and provided patient with direct contact information for care management team Assessed social determinant of health barriers;  Discussed wound care-patient states it is healing and getting much better Collaborated with BSW for assistance with food benefits and disability application Reviewed medications Advised patient to follow up on food stamp application with Social Services  Patient Goals/Self-Care Activities: Attend all scheduled provider appointments Work with the social worker to address care coordination needs and will continue to work with the clinical team to address health care and disease management related needs       Follow Up:  Patient agrees to Care Plan and Follow-up.  Plan: The Managed Medicaid care management team will reach out to the patient again over the next 60 days.  Date/time of next scheduled RN care management/care coordination outreach:  03/21/23 @ 2:30 pm  [name] RN, BSN [place]  Managed [facility] RN Care Coordinator [phone]

## 2023-01-17 NOTE — Patient Instructions (Signed)
Visit Information  Mr. Zuhlke was given information about Medicaid Managed Care team care coordination services as a part of their [facility] Medicaid benefit. Doy Fabro verbally consented to engagement with the [facility] Managed Care team.   If you are experiencing a medical emergency, please call 911 or report to your local emergency department or urgent care.   If you have a non-emergency medical problem during routine business hours, please contact your provider's office and ask to speak with a nurse.   For questions related to your [facility] health plan, please call: [phone] or go here:[email or web address]  If you would like to schedule transportation through your [facility] plan, please call the following number at least 2 days in advance of your appointment: [phone].   You can also use the MTM portal or MTM mobile app to manage your rides. Reimbursement for transportation is available through [facility]! For the portal, please go to [email or web address]  Call the [facility] Crisis Line at [phone], at any time, 24 hours a day, 7 days a week. If you are in danger or need immediate medical attention call 911.  If you would like help to quit smoking, call 1-800-QUIT-NOW ((321)132-6644) OR Espaol: 1-855-Djelo-Ya (0-272-536-6440) o para ms informacin haga clic aqu or Text READY to 347-425 to register via text  Mr. Gasbarro,   Please see education materials related to health maintenance provided by MyChart link.  The patient verbalized understanding of instructions, educational materials, and care plan provided today and agreed to receive a mailed copy of patient instructions, educational materials, and care plan.   Telephone follow up appointment with Managed Medicaid care management team member scheduled for:03/21/23  [name] RN, BSN H. Rivera Colon  Managed [facility] RN Care Coordinator [phone]   Following is a copy  of your plan of care:  Care Plan : RN Care Manager Plan of Care  Updates made by Heidi Dach, RN since 01/17/2023 12:00 AM     Problem: Health Management needs related to Wound Care      Long-Range Goal: Development of Plan of Care to address Health Management needs related to Wound Care   Start Date: 07/27/2022  Expected End Date: 03/21/2023  Priority: High  Note:   Current Barriers:  Knowledge Deficits related to plan of care for management of Wound Management  Financial Constraints.   RNCM Clinical Goal(s):  Patient will verbalize understanding of plan for management of Wound Care as evidenced by patient reports attend all scheduled medical appointments: reschedule missed visit with Wound Care as evidenced by provider EMR documentation        work with Child psychotherapist to address Financial constraints related to housing related to the management of Wound Care as evidenced by review of EMR and patient or social worker report       Interventions: Inter-disciplinary care team collaboration (see longitudinal plan of care) Evaluation of current treatment plan related to  self management and patient's adherence to plan as established by provider    H. Pylori  (Status:  Goal Met.)  Long Term Goal Evaluation of current treatment plan related to  H. Pylori ,  language barrier  self-management and patient's adherence to plan as established by provider. Discussed plans with patient for ongoing care management follow up and provided patient with direct contact information for care management team Provided education to patient re: H. Pylori Reviewed medications with patient and discussed the importance of getting all medications and taking as directed, instructed not to drink alcohol while  taking these medications Collaborated with BSW regarding resending requested resources, RNCM updated address in Epic Collaborated with [facility] to request transferring medications, delivery and  starting a charge account [name] interpreter [phone] during this entire visit   Wound Care  (Status: Goal on Track (progressing): YES.) Long Term Goal  Evaluation of current treatment plan related to  Wound Care , Financial constraints related to affording housing  self-management and patient's adherence to plan as established by provider. Discussed plans with patient for ongoing care management follow up and provided patient with direct contact information for care management team Assessed social determinant of health barriers;  Discussed wound care-patient states it is healing and getting much better Collaborated with BSW for assistance with food benefits and disability application Reviewed medications Advised patient to follow up on food stamp application with Social Services  Patient Goals/Self-Care Activities: Attend all scheduled provider appointments Work with the social worker to address care coordination needs and will continue to work with the clinical team to address health care and disease management related needs

## 2023-01-21 ENCOUNTER — Encounter (HOSPITAL_BASED_OUTPATIENT_CLINIC_OR_DEPARTMENT_OTHER): Payer: Medicaid Other | Admitting: General Surgery

## 2023-01-21 DIAGNOSIS — I872 Venous insufficiency (chronic) (peripheral): Secondary | ICD-10-CM | POA: Diagnosis not present

## 2023-01-21 DIAGNOSIS — M8580 Other specified disorders of bone density and structure, unspecified site: Secondary | ICD-10-CM | POA: Diagnosis not present

## 2023-01-21 DIAGNOSIS — L97328 Non-pressure chronic ulcer of left ankle with other specified severity: Secondary | ICD-10-CM | POA: Diagnosis not present

## 2023-01-21 DIAGNOSIS — I87332 Chronic venous hypertension (idiopathic) with ulcer and inflammation of left lower extremity: Secondary | ICD-10-CM | POA: Diagnosis not present

## 2023-01-21 DIAGNOSIS — L97322 Non-pressure chronic ulcer of left ankle with fat layer exposed: Secondary | ICD-10-CM | POA: Diagnosis not present

## 2023-01-21 DIAGNOSIS — K2950 Unspecified chronic gastritis without bleeding: Secondary | ICD-10-CM | POA: Diagnosis not present

## 2023-01-21 DIAGNOSIS — F17200 Nicotine dependence, unspecified, uncomplicated: Secondary | ICD-10-CM | POA: Diagnosis not present

## 2023-01-21 NOTE — Progress Notes (Signed)
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 evidence of substantial epithelialization. 01/11/2022: The patient did not make his appointment last week. T oday, the anterior portion of the wound is nearly closed with just a thin layer of eschar overlying the surface. The more lateral part is quite a bit smaller. Although the surface remains gritty and fibrous, it continues to epithelialize. 01/20/2022: The more distal and anterior  portion of  the wound has closed completely. The more lateral and proximal part is substantially smaller. There is some slough on the wound surface, but overall things continue to improve nicely. 01/28/2022: The wound continues to contract. There is a little bit of slough accumulation on the wound surface, but there is extensive perimeter epithelialization. 02/19/2022: It has been 3 [name] since he came to clinic due to various conflicts. His 3 layer compression wrap remained in situ for that entire period. As a result, there has been some tissue breakdown secondary to moisture. The wound is a little bit larger but fortunately there has not been a tremendous deterioration. There is some slough on the wound surface. No significant drainage or odor. 03/23/2022: It has been a month since his last visit. He has had the same 3 layer compression wrap in situ since that time. 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 Remarkably, the wound is a little bit smaller today with just a layer of slough on the surface. 03/29/2022: His wound measured slightly larger today. There is slough accumulation on the surface. It also looks as though his footwear is rubbing on his foot and may be also causing some friction at the ankle where his wound is. 04/07/2022: The wound was a little bit narrower today. He continues to have slough overlying a somewhat fibrotic surface. It appears that he has rectified the situation with his foot wear and I do not see any further evidence of friction trauma. 04/15/2022: No significant change to his wound today. There is still slough on a fibrous surface. 04/22/2022: The wound measured slightly smaller today. The surface is much cleaner and has a more robust pinkred color. It is still fairly fibrotic. 05/17/2022: The patient has not been in clinic for nearly 4 [name]. His wrap has remained in place the entire time. The wound measures a little bit smaller today. There is slough  accumulation. It remains fibrotic. 05/25/2022: The wound surface is improved, with less fibrosis and a more pink color. There is slough on the surface with some periwound eschar. 06/01/2022: The wound is a little bit smaller again today and the surface continues to improve. Still with slough buildup, but no concern for infection. 06/21/2022: The patient was absent from clinic the past couple of [name]. He returns today and the wound seems to have deteriorated somewhat. Through his interpreter, he reports that at his job, he stands basically immobile for prolonged periods of time and his feet and legs are constantly wet, meaning the wound is wet throughout his work shifts. He is unable to get a waterproof boot over his leg due to the inflexibility of his ankle. 06/29/2022: The wound is about the same size, but it is quite clean and the surface has more of a pink color. His employment has told him that they cannot make any further accommodations for him. 07/06/2022: The wound is smaller this week. There is a light layer of slough on the surface, but the drainage on his dressing has the typical blue-green color of Pseudomonas. 12/12; this is a patient to be admitted earlier in the year with [name] extensive wound across the left lateral ankle and into the anterior ankle. Initially [name] immigrant from the [place]. He speaks [place] leads and we interviewed him through [name] interpreter. He has been using endoform on the remanent of the wound. Intake nurse tells [place] that we have healed this out but it reopens. He is  no longer working 07/21/2022: The wound is smaller today. There is slough on the surface, but good granulation tissue underlying. 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 08/06/2022: The wound is a little bit smaller again today. There is thick slough on the surface, but the surface underneath is less fibrotic. 08/31/2022: The patient has missed a couple of appointments. T oday, his foot and leg  are completely macerated. He says that he has been using a plastic bag for showers and that it leaked. The wound is larger, has a thick layer of slough on the surface and is malodorous. 09/07/2022: The wound looks better this week. It is smaller with some slough on the surface. A small satellite wound has opened up just proximal to the main site. 09/15/2022: The wound has a lot more slough on it this week and the satellite wound is larger. He says that he has been standing quite a bit at work. 09/22/2022: The satellite lesion is about the same size but much more superficial. The main wound is a also about the same size, but has a healthier-looking surface. There is slough accumulation on both surfaces. 09/29/2022: The main wound is smaller. The satellite lesion is about the same size. Both have slough on the surface. 10/06/2022: Both wounds are smaller. There is slough and eschar buildup in both sites. 3/13; patient presents for follow-up. We have been using antibiotic ointment with endoform under 3 layer compression. Patient has no issues or complaints today. [name], [name] ([id number]) [id number] Page 11 of 14 10/21/2022: The wound is smaller today. Both the main wound and satellite lesion are about a third smaller than the last time I saw them. There is some slough on both surfaces. 10/29/2022: The satellite lesion has closed. The main wound is smaller again today with just some slough and eschar present. 11/05/2022: The satellite lesion unfortunately reopened, but it is quite small and superficial under some eschar. The main wound continues to contract. There is slough on the surface, but epithelium is creeping down the sides of the divot. 11/12/2022: The satellite lesion has closed again. The main wound is about the same size, but the surface appearance is more pink and viable. 11/18/2022: The wound is a little smaller today. There is slough on the surface. 11/25/2022: The  wound continues to contract. It appears to be epithelializing down the sides of the divot. There is slough on the surface. 12/02/2022: Although the wound measured about the same today, visually it appears smaller. There is slough on the surface and the tissue underneath appears healthier. 12/09/2022: Once again, the wound measurements were not significantly different, but visually it continues to appear smaller each week. There is some slough on the surface but there is epithelium filling in down the edges of the indentation of the wound. 12/16/2022: The wound is smaller by about one third today. There is a little bit of slough on the surface, but no other debris accumulation. 12/29/2022: The wound is smaller and shallower today. There is light slough on the surface. 01/03/2023: The wound is smaller again today. There is a little slough on the surface, but the granulation tissue underneath is looking healthy. 01/13/2023: Continued improvement and contraction of the wound. There is more epithelialization and and very little slough on the surface. 01/21/2023: The wound was measured a little bit larger today, but I think perhaps a portion of the epithelialized area that descends down the sides of the divot in his skin was included. There  is minimal slough present. Patient History Information obtained from Patient. Family History Unknown History. Social History Current every day smoker, Marital Status - Married, Alcohol Use - Rarely, Drug Use - No History, Caffeine Use - Moderate. Medical A Surgical History Notes nd Gastrointestinal Chronic Gastritis Objective Constitutional no acute distress. Vitals Time Taken: 11:25 AM, Height: 69 in, Weight: 170 lbs, BMI: 25.1, Temperature: 98.3 F, Pulse: 69 bpm, Respiratory Rate: 18 breaths/min, Blood Pressure: 103/60 mmHg. Respiratory Normal work of breathing on room air. General Notes: 01/21/2023: The wound was measured a little bit larger today, but I think  perhaps a portion of the epithelialized area that descends down the sides of the divot in his skin was included. There is minimal slough present. Integumentary (Hair, Skin) Wound #1 status is Open. Original cause of wound was Trauma. The date acquired was: 10/14/2020. The wound has been in treatment 102 [name]. The wound is located on the Left Ankle. The wound measures 0.8cm length x 0.8cm width x 0.1cm depth; 0.503cm^2 area and 0.05cm^3 volume. There is Fat Layer (Subcutaneous Tissue) exposed. There is no tunneling or undermining noted. There is a small amount of serous drainage noted. The wound margin is flat and intact. There is large (67-100%) pink granulation within the wound bed. There is a small (1-33%) amount of necrotic tissue within the wound bed including Adherent Slough. The periwound skin appearance had no abnormalities noted for moisture. The periwound skin appearance had no abnormalities noted for color. The periwound skin appearance exhibited: Scarring. Periwound temperature was noted as No Abnormality. 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 There was a Selective/Open Wound Non-Viable Tissue Debridement with a total area of 0.5 sq cm performed by [name] Guess, MD. With the following instrument(s): Curette to remove Viable and Non-Viable tissue/material. Material removed includes Eschar and Slough and after achieving pain control using Lidocaine 4% Topical Solution. No specimens were taken. A time out was conducted at 11:55, prior to the start of the procedure. A Minimum amount  of bleeding was controlled with Pressure. The procedure was tolerated well with a pain level of 0 throughout and a pain level of 0 following the procedure. Post Debridement Measurements: 0.8cm length x 0.8cm width x 0.1cm depth; 0.05cm^3 volume. Character of Wound/Ulcer Post Debridement is improved. Severity of Tissue Post Debridement is: Fat layer exposed. Post procedure Diagnosis Wound #1: Same as Pre-Procedure General Notes: Scribed for Dr Lady Gary by [name] Grills RN.. 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 There was a Three Layer Compression Therapy Procedure by [name] Grills, RN. Post procedure Diagnosis Wound #1: Same as Pre-Procedure Plan Follow-up Appointments: Return Appointment in 1 week. - Dr. Lady Gary - RM 3 01/31/23 at 3:15pm Other: - [place] interpreter required. Anesthetic: (In clinic) Topical Lidocaine 4% applied to wound bed Bathing/ Shower/ Hygiene: Other Bathing/Shower/Hygiene Orders/Instructions: - Please use a " CAST PROTECTOR" when showering. This can be purchased from [employer], Medical supply store,[employer]. Cost is approximately $14-27. Edema Control - Lymphedema / SCD / Other: Elevate legs to the level of the heart or above for 30 minutes daily and/or when sitting for 3-4 times a day throughout the  day. Avoid standing for long periods of time. Exercise regularly Additional Orders / Instructions: Wound #1 Left Ankle: Other: - Patient will be unable to work for the foreseeable future due to his leg wound. Patient is to remain off work until wound has healed. MD fully supports a petition for food stamps/DSS benefits. WOUND #1: - Ankle Wound Laterality: Left Cleanser: Soap and Water 1 x Per Week/30 Days Discharge Instructions: May shower and wash wound with dial antibacterial soap and water prior to dressing change. Cleanser: Vashe 5.8 (oz) 1 x Per Week/30 Days Discharge Instructions: Cleanse the wound with Vashe prior to  applying a clean dressing using gauze sponges, not tissue or cotton balls. Peri-Wound Care: Triamcinolone 15 (g) 1 x Per Week/30 Days Discharge Instructions: Use triamcinolone 15 (g) as directed Peri-Wound Care: Sween Lotion (Moisturizing lotion) 1 x Per Week/30 Days Discharge Instructions: Apply moisturizing lotion as directed Topical: Gentamicin 1 x Per Week/30 Days Discharge Instructions: As directed by physician Topical: Mupirocin Ointment 1 x Per Week/30 Days Discharge Instructions: Apply Mupirocin (Bactroban) as instructed Prim Dressing: Endoform 2x2 in 1 x Per Week/30 Days ary Discharge Instructions: Moisten with saline Secondary Dressing: Drawtex 4x4 in 1 x Per Week/30 Days Discharge Instructions: Apply over primary dressing as directed. Secondary Dressing: Woven Gauze Sponge, Non-Sterile 4x4 in 1 x Per Week/30 Days Discharge Instructions: Apply over primary dressing as directed. Com pression Wrap: Urgo K2 Lite, (equivalent to a 3 layer) two layer compression system, regular 1 x Per Week/30 Days Discharge Instructions: Apply Urgo K2 Lite as directed (alternative to 3 layer compression). 01/21/2023: The wound was measured a little bit larger today, but I think perhaps a portion of the epithelialized area that descends down the sides of the divot in his skin was included. There is minimal slough present. I used a curette to debride slough and eschar from his wound. We will apply a mixture of topical gentamicin and mupirocin to the wound bed followed by endoform as a contact layer. We are using drawtex to hold the endoform against the wound surface. Continue 3 layer compression equivalent. Follow-up in 1 week. Electronic Signature(s) Signed: 01/21/2023 12:33:25 PM By: [name] Guess MD FACS Entered By: [name] on 01/21/2023 12:33:25 [name], [name] ([id number]) [id number] Page 13 of  14 -------------------------------------------------------------------------------- HxROS Details Patient Name: Date of Service: [name] [name] [place] [name] 01/21/2023 11:30 A M Medical Record Number: [id number] Patient Account Number: [id number] Date of Birth/Sex: Treating RN: 20-Mar-1976 (47 y.o. M) Primary Care Provider: Richarda [name] Other Clinician: Referring Provider: Treating Provider/Extender: [name], [name] in Treatment: 102 Information Obtained From Patient Gastrointestinal Medical History: Past Medical History Notes: Chronic Gastritis Immunizations Pneumococcal Vaccine: Received Pneumococcal Vaccination: No Implantable Devices No devices added Family and Social History Unknown History: Yes; Current every day smoker; Marital Status - Married; Alcohol Use: Rarely; Drug Use: No History; Caffeine Use: Moderate; Financial Concerns: No; Food, Clothing or Shelter Needs: No; Support System Lacking: No; Transportation Concerns: No Electronic Signature(s) Signed: 01/21/2023 12:40:21 PM By: [name] Guess MD FACS Entered By: [name] on 01/21/2023 12:30:08 -------------------------------------------------------------------------------- SuperBill Details Patient Name: Date of Service: [name] [name] [place] [name] 01/21/2023 Medical Record Number: [id number] Patient Account Number: [id number] Date of Birth/Sex: Treating RN: 1976/01/29 (47 y.o. [name] Primary Care Provider: Richarda [name] Other Clinician: Referring Provider: Treating Provider/Extender: [name], [name] in Treatment: 102 Diagnosis Coding ICD-10 Codes Code Description [phone] Non-pressure chronic ulcer of left ankle with other specified severity I87.332 Chronic venous hypertension (  idiopathic) with ulcer and inflammation of left lower extremity Facility Procedures : CPT4 Code: [id number] Description: 97597 - DEBRIDE WOUND 1ST 20 SQ CM OR < ICD-10 Diagnosis  Description L97.328 Non-pressure chronic ulcer of left ankle with other specified severity Modifier: Quantity: 1 Physician Procedures [name], [name] ([id number]): CPT4 Code Description [id number] - WC PHYS LEVEL 4 - EST PT ICD-10 Diagnosis Description L97.328 Non-pressure chronic ulcer of left ankle with other specified seve I87.332 Chronic venous hypertension (idiopathic) with  ulcer and inflammati [id number] Page 14 of 14: Quantity Modifier 25 1 rity on of left lower extremity [name], Jobin ([id number]): [id number] 97597 - WC PHYS DEBR WO ANESTH 20 SQ CM ICD-10 Diagnosis Description L97.328 Non-pressure chronic ulcer of left ankle with other specified seve [id number] Page 14 of 14: 1 rity Electronic Signature(s) Signed: 01/21/2023 12:33:47 PM By: [name] Guess MD FACS Entered By: [name] on 01/21/2023 12:33:47

## 2023-01-21 NOTE — Progress Notes (Signed)
[name], [name] ([id number]) [id number] Page 1 of 7 Visit Report for 01/21/2023 Arrival Information Details Patient Name: Date of Service: [name] [place] NIE 01/21/2023 11:30 A M Medical Record Number: [id number] Patient Account Number: [id number] Date of Birth/Sex: Treating RN: [birth year] (47 y.o. [name] Primary Care Magda Muise: [name] Other Clinician: Referring Gor Vestal: Treating Ermalee Mealy/Extender: Pleas Patricia in Treatment: 102 Visit Information History Since Last Visit All ordered tests and consults were completed: Yes Patient Arrived: Ambulatory Added or deleted any medications: No Arrival Time: 11:25 Any new allergies or adverse reactions: No Accompanied By: interpreter Had a fall or experienced change in No Transfer Assistance: None activities of daily living that may affect Patient Identification Verified: Yes risk of falls: Secondary Verification Process Completed: Yes Signs or symptoms of abuse/neglect since last visito No Patient Requires Transmission-Based Precautions: No Hospitalized since last visit: No Patient Has Alerts: No Implantable device outside of the clinic excluding No cellular tissue based products placed in the center since last visit: Has Dressing in Place as Prescribed: Yes Pain Present Now: No Electronic Signature(s) Signed: 01/21/2023 4:18:37 PM By: Brenton Grills Entered By: Brenton Grills on 01/21/2023 11:26:30 -------------------------------------------------------------------------------- Compression Therapy Details Patient Name: Date of Service: [name] 01/21/2023 11:30 A M Medical Record Number: [id number] Patient Account Number: [id number] Date of Birth/Sex: Treating RN: 1975-09-24 (47 y.o. [name] Primary Care Lemon Sternberg: [name] Other Clinician: Referring Shakyia Bosso: Treating Franck Vinal/Extender: Duanne Guess [name], Dinah Weeks in  Treatment: 102 Compression Therapy Performed for Wound Assessment: Wound #1 Left Ankle Performed By: Clinician Brenton Grills, RN Compression Type: Three Layer Post Procedure Diagnosis Same as Pre-procedure Electronic Signature(s) Signed: 01/21/2023 4:18:37 PM By: Brenton Grills Entered By: Brenton Grills on 01/21/2023 11:57:42 [name] ([id number]) [id number] Page 2 of 7 -------------------------------------------------------------------------------- Encounter Discharge Information Details Patient Name: Date of Service: [name] [place] NIE 01/21/2023 11:30 A M Medical Record Number: [id number] Patient Account Number: [id number] Date of Birth/Sex: Treating RN: [birth year] (47 y.o. [name] Primary Care Kathya Wilz: [name] Other Clinician: Referring Waunita Sandstrom: Treating Franck Vinal/Extender: Pleas Patricia in Treatment: [phone] Encounter Discharge Information Items Post Procedure Vitals Discharge Condition: Stable Temperature (F): 97.9 Ambulatory Status: Ambulatory Pulse (bpm): 72 Discharge Destination: Home Respiratory Rate (breaths/min): 18 Transportation: Private Auto Blood Pressure (mmHg): 116/64 Accompanied By: self Schedule Follow-up Appointment: Yes Clinical Summary of Care: Patient Declined Electronic Signature(s) Signed: 01/21/2023 4:18:37 PM By: Brenton Grills Entered By: Brenton Grills on 01/21/2023 12:30:49 -------------------------------------------------------------------------------- Lower Extremity Assessment Details Patient Name: Date of Service: [name] 01/21/2023 11:30 A M Medical Record Number: [id number] Patient Account Number: [id number] Date of Birth/Sex: Treating RN: [birth year] (47 y.o. [name] Primary Care Mukund Weinreb: [name] Other Clinician: Referring Juanluis Guastella: Treating Masen Salvas/Extender: Duanne Guess [name], Dinah Weeks in Treatment: 102 Edema  Assessment Assessed: [Left: No] [Right: No] Edema: [Left: Ye] [Right: s] Calf Left: Right: Point of Measurement: 28 cm From Medial Instep 30 cm Ankle Left: Right: Point of Measurement: 8 cm From Medial Instep 21 cm Vascular Assessment Pulses: Dorsalis Pedis Palpable: [Left:Yes] Electronic Signature(s) Signed: 01/21/2023 4:18:37 PM By: Brenton Grills Entered By: Brenton Grills on 01/21/2023 11:29:20 Multi Wound Chart Details -------------------------------------------------------------------------------- [name], [name] ([id number]) [id number] Page 3 of 7 Patient Name: Date of Service: [name] [place] NIE 01/21/2023 11:30 A M Medical Record Number: [id number] Patient Account Number: [id number] Date of Birth/Sex: Treating RN: 1976-04-03 (47 y.o. M) Primary Care Amayra Kiedrowski: [name] Other Clinician: Referring Shavonne Ambroise:  Treating Sherell Christoffel/Extender: Duanne Guess [name], Dinah Weeks in Treatment: 102 Vital Signs Height(in): 69 Pulse(bpm): 69 Weight(lbs): 170 Blood Pressure(mmHg): 103/60 Body Mass Index(BMI): 25.1 Temperature(F): 98.3 Respiratory Rate(breaths/min): 18 Wound Assessments Wound Number: 1 N/A N/A Photos: No Photos N/A N/A Left Ankle N/A N/A Wound Location: Trauma N/A N/A Wounding Event: Venous Leg Ulcer N/A N/A Primary Etiology: 10/14/2020 N/A N/A Date Acquired: 102 N/A N/A Weeks of Treatment: Open N/A N/A Wound Status: No N/A N/A Wound Recurrence: 0.8x0.8x0.1 N/A N/A Measurements L x W x D (cm) 0.503 N/A N/A A (cm) : rea 0.05 N/A N/A Volume (cm) : 99.20% N/A N/A % Reduction in A rea: 99.80% N/A N/A % Reduction in Volume: Full Thickness Without Exposed N/A N/A Classification: Support Structures Small N/A N/A Exudate A mount: Serous N/A N/A Exudate Type: amber N/A N/A Exudate Color: Flat and Intact N/A N/A Wound Margin: Large (67-100%) N/A N/A Granulation A mount: Pink N/A N/A Granulation  Quality: Small (1-33%) N/A N/A Necrotic A mount: Fat Layer (Subcutaneous Tissue): Yes N/A N/A Exposed Structures: Fascia: No Tendon: No Muscle: No Joint: No Bone: No Large (67-100%) N/A N/A Epithelialization: Debridement - Selective/Open Wound N/A N/A Debridement: Pre-procedure Verification/Time Out 11:55 N/A N/A Taken: Lidocaine 4% Topical Solution N/A N/A Pain Control: Necrotic/Eschar, Slough N/A N/A Tissue Debrided: Non-Viable Tissue N/A N/A Level: 0.5 N/A N/A Debridement A (sq cm): rea Curette N/A N/A Instrument: Minimum N/A N/A Bleeding: Pressure N/A N/A Hemostasis A chieved: 0 N/A N/A Procedural Pain: 0 N/A N/A Post Procedural Pain: Procedure was tolerated well N/A N/A Debridement Treatment Response: 0.8x0.8x0.1 N/A N/A Post Debridement Measurements L x W x D (cm) 0.05 N/A N/A Post Debridement Volume: (cm) Scarring: Yes N/A N/A Periwound Skin Texture: Maceration: No N/A N/A Periwound Skin Moisture: Dry/Scaly: No No Abnormalities Noted N/A N/A Periwound Skin Color: No Abnormality N/A N/A Temperature: Compression Therapy N/A N/A Procedures Performed: Debridement Treatment Notes Electronic Signature(s) Signed: 01/21/2023 12:28:50 PM By: [name] FACS Entered By: Duanne Guess on 01/21/2023 12:28:50 Ozment, [name] ([id number]) [id number] Page 4 of 7 -------------------------------------------------------------------------------- Multi-Disciplinary Care Plan Details Patient Name: Date of Service: [name] [place] NIE 01/21/2023 11:30 A M Medical Record Number: [id number] Patient Account Number: [id number] Date of Birth/Sex: Treating RN: [birth year] (47 y.o. [name] Primary Care Risa Auman: [name] Other Clinician: Referring Yuma Pacella: Treating Anayely Constantine/Extender: Pleas Patricia in Treatment: 102 Multidisciplinary Care Plan reviewed with physician Active  Inactive Venous Leg Ulcer Nursing Diagnoses: Actual venous Insuffiency (use after diagnosis is confirmed) Knowledge deficit related to disease process and management Goals: Patient will maintain optimal edema control Date Initiated: 02/19/2022 Target Resolution Date: 01/29/2023 Goal Status: Active Interventions: Assess peripheral edema status every visit. Compression as ordered Treatment Activities: Therapeutic compression applied : 02/19/2022 Notes: Wound/Skin Impairment Nursing Diagnoses: Knowledge deficit related to ulceration/compromised skin integrity Goals: Patient/caregiver will verbalize understanding of skin care regimen Date Initiated: 02/05/2021 Target Resolution Date: 01/29/2023 Goal Status: Active Interventions: Assess patient/caregiver ability to obtain necessary supplies Assess patient/caregiver ability to perform ulcer/skin care regimen upon admission and as needed Provide education on ulcer and skin care Treatment Activities: Skin care regimen initiated : 02/05/2021 Topical wound management initiated : 02/05/2021 Notes: 03/31/21: Wound care regimen ongoing, target date extended. 04/21/21: Wound care ongoing, through interpreter patient states he is doing fine with his dressing changes. Electronic Signature(s) Signed: 01/21/2023 4:18:37 PM By: Brenton Grills Entered By: Brenton Grills on 01/21/2023 11:38:28 -------------------------------------------------------------------------------- Pain Assessment Details Patient Name: Date of Service: [name]  [name] 01/21/2023 11:30 A M Medical Record Number: [id number] Patient Account Number: [id number] DANNE, VASEK ([id number]) [phone].pdf Page 5 of 7 Date of Birth/Sex: Treating RN: 06/17/1976 (46 y.o. [name] Primary Care Rollyn Scialdone: [name] Other Clinician: Referring Judea Fennimore: Treating Nidia Grogan/Extender: Duanne Guess [name], Dinah Weeks in Treatment: [phone] Active  Problems Location of Pain Severity and Description of Pain Patient Has Paino No Site Locations Pain Management and Medication Current Pain Management: Electronic Signature(s) Signed: 01/21/2023 4:18:37 PM By: Brenton Grills Entered By: Brenton Grills on 01/21/2023 11:29:08 -------------------------------------------------------------------------------- Patient/Caregiver Education Details Patient Name: Date of Service: [name] 6/21/2024andnbsp11:30 A M Medical Record Number: [id number] Patient Account Number: [id number] Date of Birth/Gender: Treating RN: 08/18/1975 (47 y.o. [name] Primary Care Physician: [name] Other Clinician: Referring Physician: Treating Physician/Extender: Pleas Patricia in Treatment: [phone] Education Assessment Education Provided To: Patient Education Topics Provided Wound/Skin Impairment: Methods: Explain/Verbal Responses: State content correctly Electronic Signature(s) Signed: 01/21/2023 4:18:37 PM By: Brenton Grills Entered By: Brenton Grills on 01/21/2023 11:38:49 Alcalde, [name] ([id number]) [id number] Page 6 of 7 -------------------------------------------------------------------------------- Wound Assessment Details Patient Name: Date of Service: [name] 01/21/2023 11:30 A M Medical Record Number: [id number] Patient Account Number: [id number] Date of Birth/Sex: Treating RN: January 05, 1976 (47 y.o. [name] Primary Care Seibert Keeter: [name] Other Clinician: Referring Alysiah Suppa: Treating Abimbola Aki/Extender: Duanne Guess [name], Dinah Weeks in Treatment: 102 Wound Status Wound Number: 1 Primary Etiology: Venous Leg Ulcer Wound Location: Left Ankle Wound Status: Open Wounding Event: Trauma Date Acquired: 10/14/2020 Weeks Of Treatment: 102 Clustered Wound: No Wound Measurements Length: (cm) 0.8 Width: (cm) 0.8 Depth: (cm) 0.1 Area: (cm)  0.503 Volume: (cm) 0.05 % Reduction in Area: 99.2% % Reduction in Volume: 99.8% Epithelialization: Large (67-100%) Tunneling: No Undermining: No Wound Description Classification: Full Thickness Without Exposed Support Structures Wound Margin: Flat and Intact Exudate Amount: Small Exudate Type: Serous Exudate Color: amber Foul Odor After Cleansing: No Slough/Fibrino Yes Wound Bed Granulation Amount: Large (67-100%) Exposed Structure Granulation Quality: Pink Fascia Exposed: No Necrotic Amount: Small (1-33%) Fat Layer (Subcutaneous Tissue) Exposed: Yes Necrotic Quality: Adherent Slough Tendon Exposed: No Muscle Exposed: No Joint Exposed: No Bone Exposed: No Periwound Skin Texture Texture Color No Abnormalities Noted: No No Abnormalities Noted: Yes Scarring: Yes Temperature / Pain Temperature: No Abnormality Moisture No Abnormalities Noted: Yes Treatment Notes Wound #1 (Ankle) Wound Laterality: Left Cleanser Soap and Water Discharge Instruction: May shower and wash wound with dial antibacterial soap and water prior to dressing change. Vashe 5.8 (oz) Discharge Instruction: Cleanse the wound with Vashe prior to applying a clean dressing using gauze sponges, not tissue or cotton balls. Peri-Wound Care Triamcinolone 15 (g) Discharge Instruction: Use triamcinolone 15 (g) as directed Sween Lotion (Moisturizing lotion) Discharge Instruction: Apply moisturizing lotion as directed Topical Gentamicin Discharge Instruction: As directed by physician Mupirocin Ointment Discharge Instruction: Apply Mupirocin (Bactroban) as instructed Golay, [name] ([id number]) [id number] Page 7 of 7 Primary Dressing Endoform 2x2 in Discharge Instruction: Moisten with saline Secondary Dressing Drawtex 4x4 in Discharge Instruction: Apply over primary dressing as directed. Woven Gauze Sponge, Non-Sterile 4x4 in Discharge Instruction: Apply over primary dressing  as directed. Secured With Compression Wrap Urgo K2 Lite, (equivalent to a 3 layer) two layer compression system, regular Discharge Instruction: Apply Urgo K2 Lite as directed (alternative to 3 layer compression). Compression Stockings Add-Ons Electronic Signature(s) Signed: 01/21/2023 4:18:37 PM By: Brenton Grills Entered By: Brenton Grills on 01/21/2023 11:36:11 -------------------------------------------------------------------------------- Vitals Details Patient Name: Date  of Service: [name] 01/21/2023 11:30 A M Medical Record Number: [id number] Patient Account Number: [id number] Date of Birth/Sex: Treating RN: July 29, 1976 (47 y.o. [name] Primary Care Arabell Neria: [name] Other Clinician: Referring Eeshan Verbrugge: Treating Rosalene Wardrop/Extender: Duanne Guess [name], Dinah Weeks in Treatment: 102 Vital Signs Time Taken: 11:25 Temperature (F): 98.3 Height (in): 69 Pulse (bpm): 69 Weight (lbs): 170 Respiratory Rate (breaths/min): 18 Body Mass Index (BMI): 25.1 Blood Pressure (mmHg): 103/60 Reference Range: 80 - 120 mg / dl Electronic Signature(s) Signed: 01/21/2023 4:18:37 PM By: Brenton Grills Entered By: Brenton Grills on 01/21/2023 11:29:01

## 2023-01-25 ENCOUNTER — Other Ambulatory Visit: Payer: Medicaid Other

## 2023-01-25 NOTE — Patient Instructions (Signed)
Visit Information  [name] was given information about Medicaid Managed Care team care coordination services as a part of their [facility] Medicaid benefit. [name] verbally consented to engagement with the [facility] Managed Care team.   If you are experiencing a medical emergency, please call 911 or report to your local emergency department or urgent care.   If you have a non-emergency medical problem during routine business hours, please contact your provider's office and ask to speak with a nurse.   For questions related to your [facility] health plan, please call: [phone] or go here:[email or web address]  If you would like to schedule transportation through your [facility] plan, please call the following number at least 2 days in advance of your appointment: [phone].   You can also use the MTM portal or MTM mobile app to manage your rides. Reimbursement for transportation is available through [facility]! For the portal, please go to [email or web address]  Call the [facility] Crisis Line at [phone], at any time, 24 hours a day, 7 days a week. If you are in danger or need immediate medical attention call 911.  If you would like help to quit smoking, call 1-800-QUIT-NOW ((434) 505-9476) OR Espaol: 1-855-Djelo-Ya (4-132-440-1027) o para ms informacin haga clic aqu or Text READY to 253-664 to register via text  Mr. Reinig - following are the goals we discussed in your visit today:   Goals Addressed   None      Social Worker will follow up in 30 days.   Gus Puma, Kenard Gower, MHA [facility]  Managed Medicaid Social Worker [id number]   Following is a copy of your plan of care:  There are no care plans that you recently modified to display for this patient.

## 2023-01-25 NOTE — Patient Outreach (Signed)
  Medicaid Managed Care Social Work Note  01/25/2023 Name:  [name] MRN:  [id number] DOB:  1976/02/26  [name] is an 47 y.o. year old male who is a primary patient of [name], [name], NP.  The [facility] Managed Care Coordination team was consulted for assistance with:  Community Resources   [name] was given information about Medicaid Managed Care Coordination team services today. [name] Patient agreed to services and verbal consent obtained.  Engaged with patient  for by telephone forfollow up visit in response to referral for case management and/or care coordination services.   Assessments/Interventions:  Review of past medical history, allergies, medications, health status, including review of consultants reports, laboratory and other test data, was performed as part of comprehensive evaluation and provision of chronic care management services.  SDOH: (Social Determinant of Health) assessments and interventions performed: SDOH Interventions    Flowsheet Row Patient Outreach Telephone from 10/04/2022 in [place] POPULATION HEALTH DEPARTMENT Patient Outreach Telephone from 07/27/2022 in [place] POPULATION HEALTH DEPARTMENT  SDOH Interventions    Food Insecurity Interventions -- Intervention Not Indicated  Housing Interventions -- Other (Comment)  [BSW referral, scheduled on 07/28/22 at 3pm]  Transportation Interventions -- Intervention Not Indicated  Financial Strain Interventions Other (Comment)  [referral to BSW] --     BSW completed a telephone outreach with patient using pacific interpreter. Patient stated he did receive the resources BSW sent, Patient states he does have someone that can assist him with contacting resources. No additioional resources are needed at this time.  Advanced Directives Status:  Not addressed in this encounter.  Care Plan                 No Known Allergies  Medications Reviewed Today     Reviewed by [name] Dach, RN (Registered Nurse) on 01/17/23 at 1309  Med List Status: <None>   Medication Order Taking? Sig Documenting Provider Last Dose Status Informant  buPROPion (WELLBUTRIN SR) 150 MG 12 hr tablet [id number] Yes Take 1 tablet (150 mg total) by mouth daily. [name], [name], NP Taking Active   pantoprazole (PROTONIX) 40 MG tablet [id number] Yes Take 1 tablet (40 mg total) by mouth daily. 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   Follow up:  Patient agrees to Care Plan and Follow-up.  Plan: The Managed Medicaid care management team will reach out to the patient again over the next 30 days.  Date/time of next scheduled Social Work care management/care coordination outreach:  02/24/23  Gus Puma, [name], [facility]  Managed [facility] Social Worker [phone]

## 2023-01-31 ENCOUNTER — Encounter (HOSPITAL_BASED_OUTPATIENT_CLINIC_OR_DEPARTMENT_OTHER): Payer: Medicaid Other | Attending: General Surgery | Admitting: General Surgery

## 2023-01-31 DIAGNOSIS — F17200 Nicotine dependence, unspecified, uncomplicated: Secondary | ICD-10-CM | POA: Diagnosis not present

## 2023-01-31 DIAGNOSIS — L97322 Non-pressure chronic ulcer of left ankle with fat layer exposed: Secondary | ICD-10-CM | POA: Diagnosis not present

## 2023-01-31 DIAGNOSIS — I872 Venous insufficiency (chronic) (peripheral): Secondary | ICD-10-CM | POA: Diagnosis not present

## 2023-01-31 DIAGNOSIS — I87332 Chronic venous hypertension (idiopathic) with ulcer and inflammation of left lower extremity: Secondary | ICD-10-CM | POA: Insufficient documentation

## 2023-01-31 DIAGNOSIS — Z419 Encounter for procedure for purposes other than remedying health state, unspecified: Secondary | ICD-10-CM | POA: Diagnosis not present

## 2023-01-31 DIAGNOSIS — Z8619 Personal history of other infectious and parasitic diseases: Secondary | ICD-10-CM | POA: Diagnosis not present

## 2023-01-31 DIAGNOSIS — L97328 Non-pressure chronic ulcer of left ankle with other specified severity: Secondary | ICD-10-CM | POA: Insufficient documentation

## 2023-02-07 ENCOUNTER — Encounter (HOSPITAL_BASED_OUTPATIENT_CLINIC_OR_DEPARTMENT_OTHER): Payer: Medicaid Other | Admitting: General Surgery

## 2023-02-07 DIAGNOSIS — Z8619 Personal history of other infectious and parasitic diseases: Secondary | ICD-10-CM | POA: Diagnosis not present

## 2023-02-07 DIAGNOSIS — L97322 Non-pressure chronic ulcer of left ankle with fat layer exposed: Secondary | ICD-10-CM | POA: Diagnosis not present

## 2023-02-07 DIAGNOSIS — L97328 Non-pressure chronic ulcer of left ankle with other specified severity: Secondary | ICD-10-CM | POA: Diagnosis not present

## 2023-02-07 DIAGNOSIS — F17200 Nicotine dependence, unspecified, uncomplicated: Secondary | ICD-10-CM | POA: Diagnosis not present

## 2023-02-07 DIAGNOSIS — I87332 Chronic venous hypertension (idiopathic) with ulcer and inflammation of left lower extremity: Secondary | ICD-10-CM | POA: Diagnosis not present

## 2023-02-07 NOTE — Progress Notes (Signed)
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 Remarkably, the wound is a little bit smaller today with just a layer of slough on the surface. 03/29/2022: His wound measured slightly larger today. There is slough accumulation on the surface. It also looks as though his footwear is rubbing on his foot and may be also causing some friction at the ankle where his wound is. 04/07/2022: The wound was a little bit narrower today. He continues to  have slough overlying a somewhat fibrotic surface. It appears that he has rectified the situation with his foot wear and I do not see any further evidence of friction trauma. 04/15/2022: No significant change to his wound today. There is still slough on a fibrous surface. 04/22/2022: The wound measured slightly smaller today. The surface is much cleaner and has a more robust pinkred color. It is still fairly fibrotic. 05/17/2022: The patient has not been in clinic for nearly 4 [name]. His wrap has remained in place  the entire time. The wound measures a little bit smaller today. There is slough accumulation. It remains fibrotic. 05/25/2022: The wound surface is improved, with less fibrosis and a more pink color. There is slough on the surface with some periwound eschar. 06/01/2022: The wound is a little bit smaller again today and the surface continues to improve. Still with slough buildup, but no concern for infection. 06/21/2022: The patient was absent from clinic the past couple of [name]. He returns today and the wound seems to have deteriorated somewhat. Through his interpreter, he reports that at his job, he stands basically immobile for prolonged periods of time and his feet and legs are constantly wet, meaning the wound is wet throughout his work shifts. He is unable to get a waterproof boot over his leg due to the inflexibility of his ankle. 06/29/2022: The wound is about the same size, but it is quite clean and the surface has more of a pink color. His employment has told him that they cannot make any further accommodations for him. 07/06/2022: The wound is smaller this week. There is a light layer of slough on the surface, but the drainage on his dressing has the typical blue-green color of Pseudomonas. [name], [name] [name] ([id number]) [id number] Page 10 of 13 12/12; this is a patient to be admitted earlier in the year with [name] extensive wound across the left lateral ankle and into the anterior ankle. Initially [name] immigrant from the [place]. He speaks [place] leads and we interviewed him through [name] interpreter. He has been using endoform on the remanent of the wound. Intake nurse tells [place] that we have healed this out but it reopens. He is no longer working 07/21/2022: The wound is smaller today. There is slough on the surface, but good granulation tissue underlying. 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 08/06/2022: The wound is a little bit smaller again today.  There is thick slough on the surface, but the surface underneath is less fibrotic. 08/31/2022: The patient has missed a couple of appointments. T oday, his foot and leg are completely macerated. He says that he has been using a plastic bag for showers and that it leaked. The wound is larger, has a thick layer of slough on the surface and is malodorous. 09/07/2022: The wound looks better this week. It is smaller with some slough on the surface. A small satellite wound has opened up just proximal to the main site. 09/15/2022: The wound has a lot more slough on it this week and the satellite wound is larger. He says that he has been standing quite a bit at work. 09/22/2022: The satellite lesion is about the same size but much more superficial. The main wound is a also about the same size, but has a healthier-looking surface. There is slough accumulation on both surfaces. 09/29/2022: The main wound is smaller. The satellite lesion is about the same size. Both have slough on the surface. 10/06/2022: Both wounds are smaller. There  is slough and eschar buildup in both sites. 3/13; patient presents for follow-up. We have been using antibiotic ointment with endoform under 3 layer compression. Patient has no issues or complaints today. 10/21/2022: The wound is smaller today. Both the main wound and satellite lesion are about a third smaller than the last time I saw them. There is some slough on both surfaces. 10/29/2022: The satellite lesion has closed. The main wound is smaller again today with just some slough and eschar present. 11/05/2022: The satellite lesion unfortunately reopened, but it is quite small and superficial under some eschar. The main wound continues to contract. There is slough on the surface, but epithelium is creeping down the sides of the divot. 11/12/2022: The satellite lesion has closed again. The main wound is about the same size, but the surface appearance is more pink and viable. 11/18/2022: The  wound is a little smaller today. There is slough on the surface. 11/25/2022: The wound continues to contract. It appears to be epithelializing down the sides of the divot. There is slough on the surface. 12/02/2022: Although the wound measured about the same today, visually it appears smaller. There is slough on the surface and the tissue underneath appears healthier. 12/09/2022: Once again, the wound measurements were not significantly different, but visually it continues to appear smaller each week. There is some slough on the surface but there is epithelium filling in down the edges of the indentation of the wound. 12/16/2022: The wound is smaller by about one third today. There is a little bit of slough on the surface, but no other debris accumulation. 12/29/2022: The wound is smaller and shallower today. There is light slough on the surface. 01/03/2023: The wound is smaller again today. There is a little slough on the surface, but the granulation tissue underneath is looking healthy. 01/13/2023: Continued improvement and contraction of the wound. There is more epithelialization and and very little slough on the surface. 01/21/2023: The wound was measured a little bit larger today, but I think perhaps a portion of the epithelialized area that descends down the sides of the divot in his skin was included. There is minimal slough present. 01/31/2023: A band of epithelium has grown to the center of his wound dividing it into 2 separate areas. There is minimal slough on the surface. He does have some evidence of moisture related skin irritation just proximal to the wound. 02/07/2023: The wound is extremely clean without any slough accumulation this week. The band of epithelium is wider. Patient History Information obtained from Patient. Family History Unknown History. Social History Current every day smoker, Marital Status - Married, Alcohol Use - Rarely, Drug Use - No History, Caffeine Use - Moderate. Medical  A Surgical History Notes nd Gastrointestinal Chronic Gastritis Objective Constitutional no acute distress. [name], [name] [name] ([id number]) [id number] Page 11 of 13 Vitals Time Taken: 11:34 AM, Height: 69 in, Weight: 170 lbs, BMI: 25.1, Temperature: 98.3 F, Pulse: 61 bpm, Respiratory Rate: 18 breaths/min, Blood Pressure: 118/71 mmHg. Respiratory Normal work of breathing on room air. General Notes: 02/07/2023: The wound is extremely clean without any slough accumulation this week. The band of epithelium is wider. Integumentary (Hair, Skin) Wound #1 status is Open. Original cause of wound was Trauma. The date acquired was: 10/14/2020. The wound has been in treatment 104 [name]. The wound is located on the Left Ankle. The wound measures 0.2cm length x 0.2cm width x 0.1cm depth; 0.031cm^2 area and 0.003cm^3 volume. There is Fat Layer (Subcutaneous Tissue) exposed. There  is no tunneling or undermining noted. There is a small amount of serous drainage noted. The wound margin is flat and intact. There is large (67-100%) red granulation within the wound bed. There is no necrotic tissue within the wound bed. The periwound skin appearance had no abnormalities noted for moisture. The periwound skin appearance had no abnormalities noted for color. The periwound skin appearance exhibited: Scarring. Periwound temperature was noted as No Abnormality. Assessment Active Problems ICD-10 Non-pressure chronic ulcer of left ankle with other specified severity Chronic venous hypertension (idiopathic) with ulcer and inflammation of left lower extremity Procedures Wound #1 Pre-procedure diagnosis of Wound #1 is a Venous Leg Ulcer located on the Left Ankle . There was a Double Layer Compression Therapy Procedure by Zenaida Deed, RN. Post procedure Diagnosis Wound #1: Same as Pre-Procedure Notes: urgo lite. Plan Follow-up Appointments: Return Appointment in 1 week. - Dr. Lady Gary RM 1  Tuesday 7/16 @ 08:15 am Other: - [place] interpreter required. Anesthetic: (In clinic) Topical Lidocaine 4% applied to wound bed Bathing/ Shower/ Hygiene: Other Bathing/Shower/Hygiene Orders/Instructions: - Please use a " CAST PROTECTOR" when showering. This can be purchased from [employer], Medical supply store,[employer]. Cost is approximately $14-27. Edema Control - Lymphedema / SCD / Other: Elevate legs to the level of the heart or above for 30 minutes daily and/or when sitting for 3-4 times a day throughout the day. - WHEN SITTING,KEEP LEGS UP. Avoid standing for long periods of time. Exercise regularly Additional Orders / Instructions: Wound #1 Left Ankle: Other: - Patient will be unable to work for the foreseeable future due to his leg wound. Patient is to remain off work until wound has healed. MD fully supports a petition for food stamps/DSS benefits. WOUND #1: - Ankle Wound Laterality: Left Cleanser: Soap and Water 1 x Per Week/30 Days Discharge Instructions: May shower and wash wound with dial antibacterial soap and water prior to dressing change. Cleanser: Vashe 5.8 (oz) 1 x Per Week/30 Days Discharge Instructions: Cleanse the wound with Vashe prior to applying a clean dressing using gauze sponges, not tissue or cotton balls. Peri-Wound Care: Sween Lotion (Moisturizing lotion) 1 x Per Week/30 Days Discharge Instructions: Apply moisturizing lotion as directed Topical: Gentamicin 1 x Per Week/30 Days Discharge Instructions: As directed by physician Topical: Mupirocin Ointment 1 x Per Week/30 Days Discharge Instructions: Apply Mupirocin (Bactroban) as instructed Prim Dressing: Endoform 2x2 in 1 x Per Week/30 Days ary Discharge Instructions: Moisten with saline Secondary Dressing: Drawtex 4x4 in 1 x Per Week/30 Days Discharge Instructions: Apply over primary dressing as directed. Secondary Dressing: Woven Gauze Sponge, Non-Sterile 4x4 in 1 x Per Week/30 Days Discharge  Instructions: Apply over primary dressing as directed. Com pression Wrap: Urgo K2 Lite, (equivalent to a 3 layer) two layer compression system, regular 1 x Per Week/30 Days Discharge Instructions: Apply Urgo K2 Lite as directed (alternative to 3 layer compression). [name], [name] [name] ([id number]) [id number] Page 12 of 13 02/07/2023: The wound is extremely clean without any slough accumulation this week. The band of epithelium is wider. No debridement was necessary today. We will apply a mixture of topical gentamicin and mupirocin with endoform, drawtex, and 3 layer compression. Follow-up in 1 week. Electronic Signature(s) Signed: 02/07/2023 11:53:48 AM By: [name] Guess MD FACS Entered By: [name] [name] on 02/07/2023 11:53:48 -------------------------------------------------------------------------------- HxROS Details Patient Name: Date of Service: [name], [name] [name] 02/07/2023 11:15 A M Medical Record Number: [id number] Patient Account Number: [id number] Date of Birth/Sex: Treating RN: 09-25-1975 (47 y.o. M) Primary Care  Provider: Richarda [name] Other Clinician: Referring Provider: Treating Provider/Extender: [name] [name], [name] [name] in Treatment: 104 Information Obtained From Patient Gastrointestinal Medical History: Past Medical History Notes: Chronic Gastritis Immunizations Pneumococcal Vaccine: Received Pneumococcal Vaccination: No Implantable Devices No devices added Family and Social History Unknown History: Yes; Current every day smoker; Marital Status - Married; Alcohol Use: Rarely; Drug Use: No History; Caffeine Use: Moderate; Financial Concerns: No; Food, Clothing or Shelter Needs: No; Support System Lacking: No; Transportation Concerns: No Electronic Signature(s) Unsigned Entered By: [name] [name] on 02/07/2023 11:51:59 -------------------------------------------------------------------------------- SuperBill  Details Patient Name: Date of Service: [name] [name] [place] NA [name] 02/07/2023 Medical Record Number: [id number] Patient Account Number: [id number] Date of Birth/Sex: Treating RN: 1976/04/21 (47 y.o. M) Primary Care Provider: Richarda [name] Other Clinician: Referring Provider: Treating Provider/Extender: [name] [name], [name] [name] in Treatment: 104 Diagnosis Coding [place], [name] [name] ([id number]) [id number] Page 13 of 13 ICD-10 Codes Code Description [phone] Non-pressure chronic ulcer of left ankle with other specified severity I87.332 Chronic venous hypertension (idiopathic) with ulcer and inflammation of left lower extremity Physician Procedures : CPT4 Code Description Modifier [id number] - WC PHYS LEVEL 4 - EST PT ICD-10 Diagnosis Description L97.328 Non-pressure chronic ulcer of left ankle with other specified severity I87.332 Chronic venous hypertension (idiopathic) with ulcer and  inflammation of left lower extremity Quantity: 1 Electronic Signature(s) Signed: 02/07/2023 11:54:00 AM By: [name] Guess MD FACS Entered By: [name] [name] on 02/07/2023 11:54:00

## 2023-02-08 NOTE — Progress Notes (Signed)
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 TheraSkin #5 (the last 1) is scheduled for today. 12/25/2021: The wound continues to improve dramatically. He had his last application of TheraSkin 2 weeks ago. The periwound skin is in good condition and there is evidence of substantial  epithelialization. 01/11/2022: The patient did not make his appointment last week.  T oday, the anterior portion of the wound is nearly closed with just a thin layer of eschar overlying the surface. The more lateral part is quite a bit smaller. Although the surface remains gritty and fibrous, it continues to epithelialize. 01/20/2022: The more distal and anterior portion of the wound has closed completely. The more lateral and proximal part is substantially smaller. There is some slough on the wound surface, but overall things continue to improve nicely. 01/28/2022: The wound continues to contract. There is a little bit of slough accumulation on the wound surface, but there is extensive perimeter epithelialization. 02/19/2022: It has been 3 weeks since he came to clinic due to various conflicts. His 3 layer compression wrap remained in situ for that entire period. As a result, there has been some tissue breakdown secondary to moisture. The wound is a little bit larger but fortunately there has not been a tremendous deterioration. There is some slough on the wound surface. No significant drainage or odor. 03/23/2022: It has been a month since his last visit. He has had the same 3 layer compression wrap in situ since that time. 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 Remarkably, the wound is a little bit smaller today with just a layer of slough on the surface. 03/29/2022: His wound measured slightly larger today. There is slough accumulation on the surface. It also looks as though his footwear is rubbing on his foot and may be also causing some friction at the ankle where his wound is. 04/07/2022: The wound was a little bit narrower today. He continues to have slough overlying a somewhat fibrotic surface. It appears that he has rectified the situation with his foot wear and I do not see any further evidence of friction trauma. 04/15/2022: No significant change to his wound  today. There is still slough on a fibrous surface. 04/22/2022: The wound measured slightly smaller today. The surface is much cleaner and has a more robust pinkred color. It is still fairly fibrotic. 05/17/2022: The patient has not been in clinic for nearly 4 weeks. His wrap has remained in place the entire time. The wound measures a little bit smaller today. There is slough accumulation. It remains fibrotic. 05/25/2022: The wound surface is improved, with less fibrosis and a more pink color. There is slough on the surface with some periwound eschar. 06/01/2022: The wound is a little bit smaller again today and the surface continues to improve. Still with slough buildup, but no concern for infection. 06/21/2022: The patient was absent from clinic the past couple of weeks. He returns today and the wound seems to have deteriorated somewhat. Through his interpreter, he reports that at his job, he stands basically immobile for prolonged periods of time and his feet and legs are constantly wet, meaning the wound is wet throughout his work shifts. He is unable to get a waterproof boot over his leg due to the inflexibility of his ankle. 06/29/2022: The wound is about the same size, but it is quite clean and the surface has more of a pink color. His employment has told him that they cannot make any further accommodations for him. 07/06/2022: The wound is smaller this week. There is a light layer of slough on the surface, but the drainage on his dressing has the typical blue-green color of Pseudomonas. 12/12; this is a patient to be admitted earlier in the year with [name] extensive wound across the left lateral ankle and into  the anterior ankle. Initially [name] immigrant from the [place]. He speaks [place] leads and we interviewed him through [name] interpreter. He has been using endoform on the remanent of the wound. Intake nurse tells [place] that we have healed this out but it reopens. He is no longer working 07/21/2022: The  wound is smaller today. There is slough on the surface, but good granulation tissue underlying. 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 08/06/2022: The wound is a little bit smaller again today. There is thick slough on the surface, but the surface underneath is less fibrotic. 08/31/2022: The patient has missed a couple of appointments. T oday, his foot and leg are completely macerated. He says that he has been using a plastic bag for showers and that it leaked. The wound is larger, has a thick layer of slough on the surface and is malodorous. 09/07/2022: The wound looks better this week. It is smaller with some slough on the surface. A small satellite wound has opened up just proximal to the main site. 09/15/2022: The wound has a lot more slough on it this week and the satellite wound is larger. He says that he has been standing quite a bit at work. 09/22/2022: The satellite lesion is about the same size but much more superficial. The main wound is a also about the same size, but has a healthier-looking surface. There is slough accumulation on both surfaces. 09/29/2022: The main wound is smaller. The satellite lesion is about the same size. Both have slough on the surface. [name] ([id number]) [id number] Page 11 of 14 10/06/2022: Both wounds are smaller. There is slough and eschar buildup in both sites. 3/13; patient presents for follow-up. We have been using antibiotic ointment with endoform under 3 layer compression. Patient has no issues or complaints today. 10/21/2022: The wound is smaller today. Both the main wound and satellite lesion are about a third smaller than the last time I saw them. There is some slough on both surfaces. 10/29/2022: The satellite lesion has closed. The main wound is smaller again today with just some slough and eschar present. 11/05/2022: The satellite lesion unfortunately reopened, but it is quite small and superficial under  some eschar. The main wound continues to contract. There is slough on the surface, but epithelium is creeping down the sides of the divot. 11/12/2022: The satellite lesion has closed again. The main wound is about the same size, but the surface appearance is more pink and viable. 11/18/2022: The wound is a little smaller today. There is slough on the surface. 11/25/2022: The wound continues to contract. It appears to be epithelializing down the sides of the divot. There is slough on the surface. 12/02/2022: Although the wound measured about the same today, visually it appears smaller. There is slough on the surface and the tissue underneath appears healthier. 12/09/2022: Once again, the wound measurements were not significantly different, but visually it continues to appear smaller each week. There is some slough on the surface but there is epithelium filling in down the edges of the indentation of the wound. 12/16/2022: The wound is smaller by about one third today. There is a little bit of slough on the surface, but no other debris accumulation. 12/29/2022: The wound is smaller and shallower today. There is light slough on the surface. 01/03/2023: The wound is smaller again today. There is a little slough on the surface, but the granulation tissue underneath is looking healthy. 01/13/2023: Continued improvement and contraction of the wound.  There is more epithelialization and and very little slough on the surface. 01/21/2023: The wound was measured a little bit larger today, but I think perhaps a portion of the epithelialized area that descends down the sides of the divot in his skin was included. There is minimal slough present. 01/31/2023: A band of epithelium has grown to the center of his wound dividing it into 2 separate areas. There is minimal slough on the surface. He does have some evidence of moisture related skin irritation just proximal to the wound. Patient History Information obtained from  Patient. Family History Unknown History. Social History Current every day smoker, Marital Status - Married, Alcohol Use - Rarely, Drug Use - No History, Caffeine Use - Moderate. Medical A Surgical History Notes nd Gastrointestinal Chronic Gastritis Objective Constitutional no acute distress. Vitals Time Taken: 3:18 PM, Height: 69 in, Weight: 170 lbs, BMI: 25.1, Temperature: 98.2 F, Pulse: 74 bpm, Respiratory Rate: 16 breaths/min, Blood Pressure: 114/61 mmHg. Respiratory Normal work of breathing on room air. General Notes: 01/31/2023: A band of epithelium has grown through the center of his wound, dividing it into 2 separate areas. There is minimal slough on the surface. He does have some evidence of moisture related skin irritation just proximal to the wound. Integumentary (Hair, Skin) Wound #1 status is Open. Original cause of wound was Trauma. The date acquired was: 10/14/2020. The wound has been in treatment 103 weeks. The wound is located on the Left Ankle. The wound measures 0.7cm length x 0.7cm width x 0.1cm depth; 0.385cm^2 area and 0.038cm^3 volume. There is Fat Layer (Subcutaneous Tissue) exposed. There is no tunneling or undermining noted. There is a small amount of serous drainage noted. The wound margin is flat and intact. There is large (67-100%) red granulation within the wound bed. There is a small (1-33%) amount of necrotic tissue within the wound bed including Adherent Slough. The periwound skin appearance had no abnormalities noted for moisture. The periwound skin appearance had no abnormalities noted for color. The periwound skin appearance exhibited: Scarring. Periwound temperature was noted as No Abnormality. 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 There was a Selective/Open Wound Non-Viable Tissue Debridement with a total area of 0.38 sq cm performed by Duanne Guess, MD. With the following instrument(s): Curette to remove Non-Viable tissue/material. Material removed includes [facility] after achieving pain control using Lidocaine 5% topical ointment. No specimens were taken. A time out was conducted at 15:38, prior to the start of the procedure. A Minimum amount of bleeding was controlled with Pressure. The procedure was tolerated well with a pain level of 0 throughout and a pain level of 0 following the procedure. Post Debridement Measurements: 0.7cm length x 0.7cm width x 0.1cm depth; 0.038cm^3 volume. Character of Wound/Ulcer Post Debridement is improved. Severity of Tissue Post Debridement is: Fat layer exposed. Post procedure Diagnosis Wound #1: Same as Pre-Procedure General Notes: Scribed for Dr. Lady Gary by J.Scotton. 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 There was a Three Layer Compression Therapy Procedure by Karie Schwalbe, RN. Post procedure Diagnosis Wound #1: Same as Pre-Procedure Plan Follow-up Appointments: Return Appointment in 1 week. -  Dr. Lady Gary RM 1 Monday July 8th at 11:15 am Other: - [place] interpreter required. Anesthetic: (In clinic) Topical Lidocaine 4% applied to wound bed Bathing/ Shower/ Hygiene: Other Bathing/Shower/Hygiene Orders/Instructions: - Please use a " CAST PROTECTOR" when showering. This can be purchased from [employer], Medical supply store,[employer]. Cost is approximately $14-27. Edema Control - Lymphedema / SCD / Other: Elevate legs to the level of the heart or above for 30 minutes daily and/or when sitting for 3-4 times a day throughout the day. - WHEN SITTING,KEEP  LEGS UP. Avoid standing for long periods of time. Exercise regularly Additional Orders / Instructions: Wound #1 Left Ankle: Other: - Patient will be unable to work for the foreseeable future due to his leg wound. Patient is to remain off work until wound has healed. MD fully supports a petition for food stamps/DSS benefits. WOUND #1: - Ankle Wound Laterality: Left Cleanser: Soap and Water 1 x Per Week/30 Days Discharge Instructions: May shower and wash wound with dial antibacterial soap and water prior to dressing change. Cleanser: Vashe 5.8 (oz) 1 x Per Week/30 Days Discharge Instructions: Cleanse the wound with Vashe prior to applying a clean dressing using gauze sponges, not tissue or cotton balls. Peri-Wound Care: Triamcinolone 15 (g) 1 x Per Week/30 Days Discharge Instructions: Use triamcinolone 15 (g) as directed Peri-Wound Care: Sween Lotion (Moisturizing lotion) 1 x Per Week/30 Days Discharge Instructions: Apply moisturizing lotion as directed Topical: Gentamicin 1 x Per Week/30 Days Discharge Instructions: As directed by physician Topical: Mupirocin Ointment 1 x Per Week/30 Days Discharge Instructions: Apply Mupirocin (Bactroban) as instructed Prim Dressing: Endoform 2x2 in 1 x Per Week/30 Days ary Discharge Instructions: Moisten with saline Secondary Dressing: Drawtex 4x4 in 1 x Per Week/30 Days Discharge Instructions: Apply over primary dressing as directed. Secondary Dressing: Woven Gauze Sponge, Non-Sterile 4x4 in 1 x Per Week/30 Days Discharge Instructions: Apply over primary dressing as directed. Com pression Wrap: Urgo K2 Lite, (equivalent to a 3 layer) two layer compression system, regular 1 x Per Week/30 Days Discharge Instructions: Apply Urgo K2 Lite as directed (alternative to 3 layer compression). 01/31/2023: A band of epithelium has grown through the center of his wound, dividing it into 2 separate areas. There is minimal slough on the surface. He does have some  evidence of moisture related skin irritation just proximal to the wound. I used a curette to debride the slough from his wound. We will continue the mixture of topical gentamicin and mupirocin with endoform and drawtex packing. We will apply zinc oxide to the periwound to address the moisture related skin irritation. Continue 3 layer compression equivalent. Follow-up in 1 week. Electronic Signature(s) Signed: 02/04/2023 3:32:15 PM By: Shawn Stall RN, BSN [name] ([id number]) [id number] Page 13 of 14 Signed: 02/04/2023 3:32:56 PM By: [name] FACS Previous Signature: 01/31/2023 3:52:38 PM Version By: [name] FACS Entered By: Shawn Stall on 02/04/2023 15:31:33 -------------------------------------------------------------------------------- HxROS Details Patient Name: Date of Service: [name], [name] 01/31/2023 3:15 PM Medical Record Number: [id number] Patient Account Number: [id number] Date of Birth/Sex: Treating RN: 12-29-1975 (47 y.o. [name] Primary Care Provider: [name] Other Clinician: Referring Provider: Treating Provider/Extender: Pleas Patricia in Treatment: [id number] Information Obtained From Patient Gastrointestinal Medical History: Past Medical History Notes: Chronic Gastritis Immunizations Pneumococcal Vaccine: Received Pneumococcal Vaccination: No Implantable Devices No devices added Family and Social History Unknown History: Yes; Current every day smoker; Marital Status - Married; Alcohol Use: Rarely; Drug Use: No History;  Caffeine Use: Moderate; Financial Concerns: No; Food, Clothing or Shelter Needs: No; Support System Lacking: No; Transportation Concerns: No Psychologist, prison and probation services) Signed: 01/31/2023 3:55:11 PM By: [name] FACS Signed: 02/08/2023 5:06:33 PM By: [name], BSN Entered By: Duanne Guess on 01/31/2023  15:49:30 -------------------------------------------------------------------------------- SuperBill Details Patient Name: Date of Service: [name] 01/31/2023 Medical Record Number: [id number] Patient Account Number: [id number] Date of Birth/Sex: Treating RN: [birth year] (47 y.o. [name] Primary Care Provider: [name] Other Clinician: Referring Provider: Treating Provider/Extender: Jason Nest, Dinah Weeks in Treatment: 103 Diagnosis Coding ICD-10 Codes Code Description [phone] Non-pressure chronic ulcer of left ankle with other specified severity I87.332 Chronic venous hypertension (idiopathic) with ulcer and inflammation of left lower extremity Facility Procedures : NDAYISHI CPT4 CodeCOPELAND, RYGG ([id number] IC L Description: 23) [id number] - DEBRIDE WOUND 1ST 20 SQ CM OR < D-10 Diagnosis Description 97.328 Non-pressure chronic ulcer of left ankle with other specified severity Modifier: [id number] Quantity: 1227.pdf Page 14 of 14 1 Physician Procedures : CPT4 Code Description Modifier [id number] - WC PHYS LEVEL 4 - EST PT 25 ICD-10 Diagnosis Description L97.328 Non-pressure chronic ulcer of left ankle with other specified severity I87.332 Chronic venous hypertension (idiopathic) with ulcer and  inflammation of left lower extremity Quantity: 1 : [id number] - WC PHYS DEBR WO ANESTH 20 SQ CM ICD-10 Diagnosis Description L97.328 Non-pressure chronic ulcer of left ankle with other specified severity Quantity: 1 Electronic Signature(s) Signed: 01/31/2023 3:54:27 PM By: [name] FACS Entered By: Duanne Guess on 01/31/2023 15:54:27

## 2023-02-08 NOTE — Progress Notes (Signed)
[name], [name] ([id number]) [id number] Page 1 of 7 Visit Report for 02/07/2023 Arrival Information Details Patient Name: Date of Service: [name] 02/07/2023 11:15 A M Medical Record Number: [id number] Patient Account Number: [id number] Date of Birth/Sex: Treating RN: 1976/01/17 (47 y.o. [name] Primary Care [name]: [name] Other Clinician: Referring [name]: Treating [name]/Extender: [name] in Treatment: 104 Visit Information History Since Last Visit Added or deleted any medications: No Patient Arrived: Ambulatory Any new allergies or adverse reactions: No Arrival Time: 11:32 Had a fall or experienced change in No Accompanied By: self activities of daily living that may affect Transfer Assistance: None risk of falls: Patient Identification Verified: Yes Signs or symptoms of abuse/neglect since last visito No Secondary Verification Process Completed: Yes Hospitalized since last visit: No Patient Requires Transmission-Based Precautions: No Implantable device outside of the clinic excluding No Patient Has Alerts: No cellular tissue based products placed in the center since last visit: Has Dressing in Place as Prescribed: Yes Has Compression in Place as Prescribed: Yes Pain Present Now: Yes Electronic Signature(s) Signed: 02/07/2023 5:51:31 PM By: [name] Deed RN, BSN Entered By: [name] on 02/07/2023 11:33:02 -------------------------------------------------------------------------------- Compression Therapy Details Patient Name: Date of Service: [name] [name] Marseilles NA [name] 02/07/2023 11:15 A M Medical Record Number: [id number] Patient Account Number: [id number] Date of Birth/Sex: Treating RN: Dec 28, 1975 (47 y.o. [name] Primary Care Malori Myers: [name] Other Clinician: Referring [name]: Treating [name]/Extender: [name], [name] in  Treatment: 104 Compression Therapy Performed for Wound Assessment: Wound #1 Left Ankle Performed By: Clinician [name] Deed, RN Compression Type: Double Layer Post Procedure Diagnosis Same as Pre-procedure Notes urgo lite Electronic Signature(s) Signed: 02/07/2023 5:51:31 PM By: [name] Deed RN, BSN Entered By: [name] on 02/07/2023 11:49:22 Kiene, [name] ([id number]) [id number] Page 2 of 7 -------------------------------------------------------------------------------- Encounter Discharge Information Details Patient Name: Date of Service: [name] 02/07/2023 11:15 A M Medical Record Number: [id number] Patient Account Number: [id number] Date of Birth/Sex: Treating RN: [birth year] (47 y.o. [name] Primary Care Annsleigh Dragoo: [name] Other Clinician: Referring [name]: Treating [name]/Extender: [name] in Treatment: [phone] Encounter Discharge Information Items Discharge Condition: Stable Ambulatory Status: Ambulatory Discharge Destination: Home Transportation: Private Auto Accompanied By: self Schedule Follow-up Appointment: Yes Clinical Summary of Care: Patient Declined Electronic Signature(s) Signed: 02/07/2023 5:51:31 PM By: [name] Deed RN, BSN Entered By: [name] on 02/07/2023 12:03:54 -------------------------------------------------------------------------------- Lower Extremity Assessment Details Patient Name: Date of Service: [name] [name] Marseilles NA [name] 02/07/2023 11:15 A M Medical Record Number: [id number] Patient Account Number: [id number] Date of Birth/Sex: Treating RN: 08-Apr-1976 (47 y.o. [name] Primary Care [name]: [name] Other Clinician: Referring [name]: Treating Zalma Channing/Extender: [name], [name] in Treatment: 104 Edema Assessment Assessed: [Left: No] [Right: No] Edema: [Left: Ye] [Right: s] Calf Left:  Right: Point of Measurement: 28 cm From Medial Instep 30.8 cm Ankle Left: Right: Point of Measurement: 8 cm From Medial Instep 21.5 cm Vascular Assessment Pulses: Dorsalis Pedis Palpable: [Left:Yes] Electronic Signature(s) Signed: 02/07/2023 5:51:31 PM By: [name] Deed RN, BSN Entered By: [name] on 02/07/2023 11:40:08 [name], [name] ([id number]) [id number] Page 3 of 7 -------------------------------------------------------------------------------- Multi Wound Chart Details Patient Name: Date of Service: [name] [name] Marseilles NA [name] 02/07/2023 11:15 A M Medical Record Number: [id number] Patient Account Number: [id number] Date of Birth/Sex: Treating RN: November 09, 1975 (47 y.o. M) Primary Care Jennell Janosik: [name] Other Clinician: Referring [name]: Treating [name]/Extender: [name], [name]  in Treatment: 104 Vital Signs Height(in): 69 Pulse(bpm): 61 Weight(lbs): 170 Blood Pressure(mmHg): 118/71 Body Mass Index(BMI): 25.1 Temperature(F): 98.3 Respiratory Rate(breaths/min): 18 [1:Photos:] [N/A:N/A] Left Ankle N/A N/A Wound Location: Trauma N/A N/A Wounding Event: Venous Leg Ulcer N/A N/A Primary Etiology: 10/14/2020 N/A N/A Date Acquired: 104 N/A N/A [name] of Treatment: Open N/A N/A Wound Status: No N/A N/A Wound Recurrence: 0.2x0.2x0.1 N/A N/A Measurements L x W x D (cm) 0.031 N/A N/A A (cm) : rea 0.003 N/A N/A Volume (cm) : 99.90% N/A N/A % Reduction in A rea: 100.00% N/A N/A % Reduction in Volume: Full Thickness Without Exposed N/A N/A Classification: Support Structures Small N/A N/A Exudate Amount: Serous N/A N/A Exudate Type: amber N/A N/A Exudate Color: Flat and Intact N/A N/A Wound Margin: Large (67-100%) N/A N/A Granulation Amount: Red N/A N/A Granulation Quality: None Present (0%) N/A N/A Necrotic Amount: Fat Layer (Subcutaneous Tissue): Yes N/A N/A Exposed Structures: Fascia:  No Tendon: No Muscle: No Joint: No Bone: No Large (67-100%) N/A N/A Epithelialization: Scarring: Yes N/A N/A Periwound Skin Texture: Maceration: No N/A N/A Periwound Skin Moisture: Dry/Scaly: No No Abnormalities Noted N/A N/A Periwound Skin Color: No Abnormality N/A N/A Temperature: Compression Therapy N/A N/A Procedures Performed: Treatment Notes Electronic Signature(s) Signed: 02/07/2023 11:51:10 AM By: [name] FACS Entered By: Duanne Guess on 02/07/2023 11:51:10 Dasch, [name] ([id number]) [id number] Page 4 of 7 -------------------------------------------------------------------------------- Multi-Disciplinary Care Plan Details Patient Name: Date of Service: [name] 02/07/2023 11:15 A M Medical Record Number: [id number] Patient Account Number: [id number] Date of Birth/Sex: Treating RN: 1975-11-10 (47 y.o. [name] Primary Care Jester Klingberg: [name] Other Clinician: Referring Quirino Kakos: Treating Manuelita Moxon/Extender: [name] in Treatment: 104 Multidisciplinary Care Plan reviewed with physician Active Inactive Venous Leg Ulcer Nursing Diagnoses: Actual venous Insuffiency (use after diagnosis is confirmed) Knowledge deficit related to disease process and management Goals: Patient will maintain optimal edema control Date Initiated: 02/19/2022 Target Resolution Date: 03/31/2023 Goal Status: Active Interventions: Assess peripheral edema status every visit. Compression as ordered Treatment Activities: Therapeutic compression applied : 02/19/2022 Notes: Wound/Skin Impairment Nursing Diagnoses: Knowledge deficit related to ulceration/compromised skin integrity Goals: Patient/caregiver will verbalize understanding of skin care regimen Date Initiated: 02/05/2021 Target Resolution Date: 03/31/2023 Goal Status: Active Interventions: Assess patient/caregiver ability to obtain  necessary supplies Assess patient/caregiver ability to perform ulcer/skin care regimen upon admission and as needed Provide education on ulcer and skin care Treatment Activities: Skin care regimen initiated : 02/05/2021 Topical wound management initiated : 02/05/2021 Notes: 03/31/21: Wound care regimen ongoing, target date extended. 04/21/21: Wound care ongoing, through interpreter patient states he is doing fine with his dressing changes. Electronic Signature(s) Signed: 02/07/2023 5:51:31 PM By: [name] Deed RN, BSN Entered By: [name] on 02/07/2023 11:45:00 Pain Assessment Details -------------------------------------------------------------------------------- [name] ([id number]) [id number] Page 5 of 7 Patient Name: Date of Service: [name] 02/07/2023 11:15 A M Medical Record Number: [id number] Patient Account Number: [id number] Date of Birth/Sex: Treating RN: 04-27-1976 (47 y.o. [name] Primary Care Chrishaun Sasso: [name] Other Clinician: Referring Lanita Stammen: Treating Talyssa Gibas/Extender: [name], [name] in Treatment: [phone] Active Problems Location of Pain Severity and Description of Pain Patient Has Paino Yes Site Locations Pain Location: Generalized Pain With Dressing Change: No Duration of the Pain. Constant / Intermittento Intermittent Rate the pain. Current Pain Level: 2 Character of Pain Describe the Pain: Other: sore Pain Management and Medication Current Pain Management: Notes pt reports soreness to bottom  of left foot Electronic Signature(s) Signed: 02/07/2023 5:51:31 PM By: [name] Deed RN, BSN Entered By: [name] on 02/07/2023 11:36:03 -------------------------------------------------------------------------------- Patient/Caregiver Education Details Patient Name: Date of Service: [name] [name] Marseilles NA [name] 7/8/2024andnbsp11:15 A M Medical Record Number:  [id number] Patient Account Number: [id number] Date of Birth/Gender: Treating RN: July 27, 1976 (47 y.o. [name] Primary Care Physician: [name] Other Clinician: Referring Physician: Treating Physician/Extender: [name] in Treatment: [phone] Education Assessment Education Provided To: Patient Education Topics Provided Venous: Methods: Explain/Verbal Responses: Reinforcements needed, State content correctly Goodness, [name] ([id number]) [id number] Page 6 of 7 Electronic Signature(s) Signed: 02/07/2023 5:51:31 PM By: [name] Deed RN, BSN Entered By: [name] on 02/07/2023 11:45:34 -------------------------------------------------------------------------------- Wound Assessment Details Patient Name: Date of Service: [name] [name] Marseilles NA [name] 02/07/2023 11:15 A M Medical Record Number: [id number] Patient Account Number: [id number] Date of Birth/Sex: Treating RN: 1976-04-30 (47 y.o. [name] Primary Care Azucena Dart: [name] Other Clinician: Referring Taje Tondreau: Treating Saketh Daubert/Extender: [name], [name] in Treatment: 104 Wound Status Wound Number: 1 Primary Etiology: Venous Leg Ulcer Wound Location: Left Ankle Wound Status: Open Wounding Event: Trauma Date Acquired: 10/14/2020 [name] Of Treatment: 104 Clustered Wound: No Photos Wound Measurements Length: (cm) 0.2 Width: (cm) 0.2 Depth: (cm) 0.1 Area: (cm) 0.031 Volume: (cm) 0.003 % Reduction in Area: 99.9% % Reduction in Volume: 100% Epithelialization: Large (67-100%) Tunneling: No Undermining: No Wound Description Classification: Full Thickness Without Exposed Suppor Wound Margin: Flat and Intact Exudate Amount: Small Exudate Type: Serous Exudate Color: amber t Structures Foul Odor After Cleansing: No Slough/Fibrino Yes Wound Bed Granulation Amount: Large (67-100%) Exposed Structure Granulation Quality:  Red Fascia Exposed: No Necrotic Amount: None Present (0%) Fat Layer (Subcutaneous Tissue) Exposed: Yes Tendon Exposed: No Muscle Exposed: No Joint Exposed: No Bone Exposed: No Periwound Skin Texture Texture Color No Abnormalities Noted: No No Abnormalities Noted: Yes Scarring: Yes Temperature / Pain Temperature: No Abnormality Moisture No Abnormalities Noted: Yes Tenbrink, [name] ([id number]) [id number] Page 7 of 7 Treatment Notes Wound #1 (Ankle) Wound Laterality: Left Cleanser Soap and Water Discharge Instruction: May shower and wash wound with dial antibacterial soap and water prior to dressing change. Vashe 5.8 (oz) Discharge Instruction: Cleanse the wound with Vashe prior to applying a clean dressing using gauze sponges, not tissue or cotton balls. Peri-Wound Care Sween Lotion (Moisturizing lotion) Discharge Instruction: Apply moisturizing lotion as directed Topical Gentamicin Discharge Instruction: As directed by physician Mupirocin Ointment Discharge Instruction: Apply Mupirocin (Bactroban) as instructed Primary Dressing Endoform 2x2 in Discharge Instruction: Moisten with saline Secondary Dressing Drawtex 4x4 in Discharge Instruction: Apply over primary dressing as directed. Woven Gauze Sponge, Non-Sterile 4x4 in Discharge Instruction: Apply over primary dressing as directed. Secured With Compression Wrap Urgo K2 Lite, (equivalent to a 3 layer) two layer compression system, regular Discharge Instruction: Apply Urgo K2 Lite as directed (alternative to 3 layer compression). Compression Stockings Add-Ons Electronic Signature(s) Signed: 02/07/2023 5:51:31 PM By: [name] Deed RN, BSN Entered By: [name] on 02/07/2023 11:44:31 -------------------------------------------------------------------------------- Vitals Details Patient Name: Date of Service: [name], [name] [name] 02/07/2023 11:15 A M Medical Record Number:  [id number] Patient Account Number: [id number] Date of Birth/Sex: Treating RN: [birth year] (47 y.o. [name] Primary Care Zenovia Justman: [name] Other Clinician: Referring Rufino Staup: Treating Kaedin Hicklin/Extender: [name], [name] in Treatment: 104 Vital Signs Time Taken: 11:34 Temperature (F): 98.3 Height (in): 69 Pulse (bpm): 61 Weight (lbs): 170 Respiratory Rate (breaths/min): 18 Body Mass Index (BMI): 25.1 Blood Pressure (  mmHg): 118/71 Reference Range: 80 - 120 mg / dl Electronic Signature(s) Signed: 02/07/2023 5:51:31 PM By: [name] Deed RN, BSN Entered By: [name] on 02/07/2023 11:35:02

## 2023-02-08 NOTE — Progress Notes (Signed)
[name], [name] ([id number]) [id number] Page 1 of 7 Visit Report for 01/31/2023 Arrival Information Details Patient Name: Date of Service: [name] [place] NIE 01/31/2023 3:15 PM Medical Record Number: [id number] Patient Account Number: [id number] Date of Birth/Sex: Treating RN: 04-03-1976 (47 y.o. [name] Primary Care Addison Freimuth: [name] Other Clinician: Referring Estanislao Harmon: Treating Devonn Giampietro/Extender: Pleas Patricia in Treatment: 103 Visit Information History Since Last Visit Added or deleted any medications: No Patient Arrived: Ambulatory Any new allergies or adverse reactions: No Arrival Time: 15:17 Had a fall or experienced change in No Accompanied By: Interpreter activities of daily living that may affect Transfer Assistance: None risk of falls: Patient Identification Verified: Yes Signs or symptoms of abuse/neglect since last visito No Patient Requires Transmission-Based Precautions: No Hospitalized since last visit: No Patient Has Alerts: No Implantable device outside of the clinic excluding No cellular tissue based products placed in the center since last visit: Has Dressing in Place as Prescribed: Yes Has Compression in Place as Prescribed: Yes Pain Present Now: No Electronic Signature(s) Signed: 01/31/2023 4:25:18 PM By: Karie Schwalbe RN Entered By: Karie Schwalbe on 01/31/2023 16:18:56 -------------------------------------------------------------------------------- Compression Therapy Details Patient Name: Date of Service: [name] 01/31/2023 3:15 PM Medical Record Number: [id number] Patient Account Number: [id number] Date of Birth/Sex: Treating RN: 1975-11-02 (47 y.o. [name] Primary Care Carlina Derks: [name] Other Clinician: Referring Maika Kaczmarek: Treating Paxton Binns/Extender: Jason Nest, Dinah Weeks in Treatment: 103 Compression Therapy Performed for Wound  Assessment: Wound #1 Left Ankle Performed By: Clinician Karie Schwalbe, RN Compression Type: Three Layer Post Procedure Diagnosis Same as Pre-procedure Electronic Signature(s) Signed: 01/31/2023 4:25:18 PM By: Karie Schwalbe RN Entered By: Karie Schwalbe on 01/31/2023 15:44:36 Derenzo, [name] ([id number]) [id number] Page 2 of 7 -------------------------------------------------------------------------------- Encounter Discharge Information Details Patient Name: Date of Service: [name] 01/31/2023 3:15 PM Medical Record Number: [id number] Patient Account Number: [id number] Date of Birth/Sex: Treating RN: 06/30/1976 (47 y.o. [name] Primary Care Frankye Schwegel: [name] Other Clinician: Referring Zondra Lawlor: Treating Maham Quintin/Extender: Pleas Patricia in Treatment: [phone] Encounter Discharge Information Items Post Procedure Vitals Discharge Condition: Stable Temperature (F): 98.2 Ambulatory Status: Ambulatory Pulse (bpm): 74 Discharge Destination: Home Respiratory Rate (breaths/min): 16 Transportation: Private Auto Blood Pressure (mmHg): 114/61 Accompanied By: Interpreter Schedule Follow-up Appointment: Yes Clinical Summary of Care: Patient Declined Electronic Signature(s) Signed: 01/31/2023 4:25:18 PM By: Karie Schwalbe RN Entered By: Karie Schwalbe on 01/31/2023 16:24:40 -------------------------------------------------------------------------------- Lower Extremity Assessment Details Patient Name: Date of Service: [name] 01/31/2023 3:15 PM Medical Record Number: [id number] Patient Account Number: [id number] Date of Birth/Sex: Treating RN: 1976/07/14 (47 y.o. [name] Primary Care Audrianna Driskill: [name] Other Clinician: Referring Metzli Pollick: Treating Garner Dullea/Extender: Jason Nest, Dinah Weeks in Treatment: 103 Edema Assessment Assessed: [Left: No] [Right: No] Edema:  [Left: Ye] [Right: s] Calf Left: Right: Point of Measurement: 28 cm From Medial Instep 30.1 cm Ankle Left: Right: Point of Measurement: 8 cm From Medial Instep 21.1 cm Vascular Assessment Pulses: Dorsalis Pedis Palpable: [Left:Yes] Electronic Signature(s) Signed: 01/31/2023 4:25:18 PM By: Karie Schwalbe RN Entered By: Karie Schwalbe on 01/31/2023 16:22:08 Multi Wound Chart Details -------------------------------------------------------------------------------- [name] ([id number]) [id number] Page 3 of 7 Patient Name: Date of Service: [name] 01/31/2023 3:15 PM Medical Record Number: [id number] Patient Account Number: [id number] Date of Birth/Sex: Treating RN: 1976-05-29 (47 y.o. [name] Primary Care Von Inscoe: [name] Other Clinician: Referring Dechelle Attaway: Treating Locklan Canoy/Extender: Jason Nest,  Dinah Weeks in Treatment: 103 Wound Assessments Wound Number: 1 N/A N/A Photos: N/A N/A Left Ankle N/A N/A Wound Location: Trauma N/A N/A Wounding Event: Venous Leg Ulcer N/A N/A Primary Etiology: 10/14/2020 N/A N/A Date Acquired: 103 N/A N/A Weeks of Treatment: Open N/A N/A Wound Status: No N/A N/A Wound Recurrence: 0.7x0.7x0.1 N/A N/A Measurements L x W x D (cm) 0.385 N/A N/A A (cm) : rea 0.038 N/A N/A Volume (cm) : 99.40% N/A N/A % Reduction in A rea: 99.80% N/A N/A % Reduction in Volume: Full Thickness Without Exposed N/A N/A Classification: Support Structures Small N/A N/A Exudate A mount: Serous N/A N/A Exudate Type: amber N/A N/A Exudate Color: Flat and Intact N/A N/A Wound Margin: Large (67-100%) N/A N/A Granulation A mount: Red N/A N/A Granulation Quality: Small (1-33%) N/A N/A Necrotic A mount: Fat Layer (Subcutaneous Tissue): Yes N/A N/A Exposed Structures: Fascia: No Tendon: No Muscle: No Joint: No Bone: No Large (67-100%) N/A  N/A Epithelialization: Debridement - Selective/Open Wound N/A N/A Debridement: Pre-procedure Verification/Time Out 15:38 N/A N/A Taken: Lidocaine 5% topical ointment N/A N/A Pain Control: Slough N/A N/A Tissue Debrided: Non-Viable Tissue N/A N/A Level: 0.38 N/A N/A Debridement A (sq cm): rea Curette N/A N/A Instrument: Minimum N/A N/A Bleeding: Pressure N/A N/A Hemostasis A chieved: 0 N/A N/A Procedural Pain: 0 N/A N/A Post Procedural Pain: Procedure was tolerated well N/A N/A Debridement Treatment Response: 0.7x0.7x0.1 N/A N/A Post Debridement Measurements L x W x D (cm) 0.038 N/A N/A Post Debridement Volume: (cm) Scarring: Yes N/A N/A Periwound Skin Texture: Maceration: No N/A N/A Periwound Skin Moisture: Dry/Scaly: No No Abnormalities Noted N/A N/A Periwound Skin Color: No Abnormality N/A N/A Temperature: Compression Therapy N/A N/A Procedures Performed: Debridement Treatment Notes Electronic Signature(s) Signed: 01/31/2023 3:48:04 PM By: [name] FACS Signed: 02/08/2023 5:06:33 PM By: [name], BSN Entered By: Duanne Guess on 01/31/2023 15:48:04 [name], [name] ([id number]) [id number] Page 4 of 7 -------------------------------------------------------------------------------- Multi-Disciplinary Care Plan Details Patient Name: Date of Service: [name] 01/31/2023 3:15 PM Medical Record Number: [id number] Patient Account Number: [id number] Date of Birth/Sex: Treating RN: 1976/02/12 (47 y.o. [name] Primary Care Arend Bahl: [name] Other Clinician: Referring Lucely Leard: Treating Gael Londo/Extender: Pleas Patricia in Treatment: [phone] Multidisciplinary Care Plan reviewed with physician Active Inactive Venous Leg Ulcer Nursing Diagnoses: Actual venous Insuffiency (use after diagnosis is confirmed) Knowledge deficit related to disease process and  management Goals: Patient will maintain optimal edema control Date Initiated: 02/19/2022 Target Resolution Date: 03/31/2023 Goal Status: Active Interventions: Assess peripheral edema status every visit. Compression as ordered Treatment Activities: Therapeutic compression applied : 02/19/2022 Notes: Wound/Skin Impairment Nursing Diagnoses: Knowledge deficit related to ulceration/compromised skin integrity Goals: Patient/caregiver will verbalize understanding of skin care regimen Date Initiated: 02/05/2021 Target Resolution Date: 03/31/2023 Goal Status: Active Interventions: Assess patient/caregiver ability to obtain necessary supplies Assess patient/caregiver ability to perform ulcer/skin care regimen upon admission and as needed Provide education on ulcer and skin care Treatment Activities: Skin care regimen initiated : 02/05/2021 Topical wound management initiated : 02/05/2021 Notes: 03/31/21: Wound care regimen ongoing, target date extended. 04/21/21: Wound care ongoing, through interpreter patient states he is doing fine with his dressing changes. Electronic Signature(s) Signed: 01/31/2023 4:25:18 PM By: Karie Schwalbe RN Entered By: Karie Schwalbe on 01/31/2023 16:23:01 Pain Assessment Details -------------------------------------------------------------------------------- [name] ([id number]) [id number] Page 5 of 7 Patient Name: Date of Service: [name] 01/31/2023 3:15 PM Medical Record Number: [id number] Patient Account  Number: [id number] Date of Birth/Sex: Treating RN: Jan 12, 1976 (47 y.o. [name] Primary Care Damarian Priola: [name] Other Clinician: Referring Alease Fait: Treating Shereta Crothers/Extender: Jason Nest, Dinah Weeks in Treatment: [phone] Active Problems Location of Pain Severity and Description of Pain Patient Has Paino No Site Locations Pain Management and Medication Current Pain  Management: Electronic Signature(s) Signed: 01/31/2023 4:25:18 PM By: Karie Schwalbe RN Entered By: Karie Schwalbe on 01/31/2023 16:21:46 -------------------------------------------------------------------------------- Patient/Caregiver Education Details Patient Name: Date of Service: [name] 7/1/2024andnbsp3:15 PM Medical Record Number: [id number] Patient Account Number: [id number] Date of Birth/Gender: Treating RN: 1976/06/03 (47 y.o. [name] Primary Care Physician: [name] Other Clinician: Referring Physician: Treating Physician/Extender: Pleas Patricia in Treatment: [phone] Education Assessment Education Provided To: Patient Education Topics Provided Wound/Skin Impairment: Methods: Explain/Verbal Responses: Return demonstration correctly Electronic Signature(s) Signed: 01/31/2023 4:25:18 PM By: Karie Schwalbe RN Entered By: Karie Schwalbe on 01/31/2023 16:23:16 [name], [name] ([id number]) [id number] Page 6 of 7 -------------------------------------------------------------------------------- Wound Assessment Details Patient Name: Date of Service: [name] 01/31/2023 3:15 PM Medical Record Number: [id number] Patient Account Number: [id number] Date of Birth/Sex: Treating RN: 30-Mar-1976 (47 y.o. [name] Primary Care Zivah Mayr: [name] Other Clinician: Referring Trenton Passow: Treating Tanae Petrosky/Extender: Jason Nest, Dinah Weeks in Treatment: 103 Wound Status Wound Number: 1 Primary Etiology: Venous Leg Ulcer Wound Location: Left Ankle Wound Status: Open Wounding Event: Trauma Date Acquired: 10/14/2020 Weeks Of Treatment: 103 Clustered Wound: No Photos Wound Measurements Length: (cm) 0.7 Width: (cm) 0.7 Depth: (cm) 0.1 Area: (cm) 0.385 Volume: (cm) 0.038 % Reduction in Area: 99.4% % Reduction in Volume: 99.8% Epithelialization: Large  (67-100%) Tunneling: No Undermining: No Wound Description Classification: Full Thickness Without Exposed Suppor Wound Margin: Flat and Intact Exudate Amount: Small Exudate Type: Serous Exudate Color: amber t Structures Foul Odor After Cleansing: No Slough/Fibrino Yes Wound Bed Granulation Amount: Large (67-100%) Exposed Structure Granulation Quality: Red Fascia Exposed: No Necrotic Amount: Small (1-33%) Fat Layer (Subcutaneous Tissue) Exposed: Yes Necrotic Quality: Adherent Slough Tendon Exposed: No Muscle Exposed: No Joint Exposed: No Bone Exposed: No Periwound Skin Texture Texture Color No Abnormalities Noted: No No Abnormalities Noted: Yes Scarring: Yes Temperature / Pain Temperature: No Abnormality Moisture No Abnormalities Noted: Yes Electronic Signature(s) Signed: 01/31/2023 4:25:18 PM By: Karie Schwalbe RN Entered By: Karie Schwalbe on 01/31/2023 15:35:37 [name], [name] ([id number]) [id number] Page 7 of 7 -------------------------------------------------------------------------------- Vitals Details Patient Name: Date of Service: [name] 01/31/2023 3:15 PM Medical Record Number: [id number] Patient Account Number: [id number] Date of Birth/Sex: Treating RN: 08-10-1975 (47 y.o. [name] Primary Care Loel Betancur: [name] Other Clinician: Referring Alyse Kathan: Treating Keigan Girten/Extender: Jason Nest, Dinah Weeks in Treatment: 103 Vital Signs Time Taken: 15:18 Temperature (F): 98.2 Height (in): 69 Pulse (bpm): 74 Weight (lbs): 170 Respiratory Rate (breaths/min): 16 Body Mass Index (BMI): 25.1 Blood Pressure (mmHg): 114/61 Reference Range: 80 - 120 mg / dl Electronic Signature(s) Signed: 01/31/2023 4:25:18 PM By: Karie Schwalbe RN Entered By: Karie Schwalbe on 01/31/2023 16:21:40

## 2023-02-15 ENCOUNTER — Ambulatory Visit (HOSPITAL_BASED_OUTPATIENT_CLINIC_OR_DEPARTMENT_OTHER): Payer: Medicaid Other | Admitting: General Surgery

## 2023-02-21 ENCOUNTER — Encounter (HOSPITAL_BASED_OUTPATIENT_CLINIC_OR_DEPARTMENT_OTHER): Payer: Medicaid Other | Admitting: General Surgery

## 2023-02-21 DIAGNOSIS — I87332 Chronic venous hypertension (idiopathic) with ulcer and inflammation of left lower extremity: Secondary | ICD-10-CM | POA: Diagnosis not present

## 2023-02-21 DIAGNOSIS — L97328 Non-pressure chronic ulcer of left ankle with other specified severity: Secondary | ICD-10-CM | POA: Diagnosis not present

## 2023-02-21 DIAGNOSIS — Z8619 Personal history of other infectious and parasitic diseases: Secondary | ICD-10-CM | POA: Diagnosis not present

## 2023-02-21 DIAGNOSIS — F17200 Nicotine dependence, unspecified, uncomplicated: Secondary | ICD-10-CM | POA: Diagnosis not present

## 2023-02-21 NOTE — Progress Notes (Signed)
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 satellite wound is larger. He says that he has been standing quite a bit at work. 09/22/2022: The satellite lesion is about the same size but much more superficial. The main wound is a also about the same size, but has a healthier-looking surface. There is  slough accumulation on both surfaces. 09/29/2022: The main wound is smaller. The satellite lesion is about the same size. Both have slough on the surface. 10/06/2022: Both wounds are smaller. There is slough and eschar buildup in both sites. 3/13; patient presents for follow-up. We have been using antibiotic ointment with endoform under 3 layer compression. Patient has no issues or complaints today. 10/21/2022: The wound is smaller today. Both the main wound and satellite lesion are about a third smaller than the last time I saw them. There is some slough on both surfaces. 10/29/2022: The satellite lesion has closed. The main wound is smaller again today with just some slough and eschar present. 11/05/2022: The satellite lesion unfortunately reopened, but it is quite small and superficial under some eschar. The main wound continues to contract. There is slough on the surface, but epithelium is creeping down the sides of the divot. 11/12/2022: The satellite lesion has closed again. The main wound is about the same size, but the surface appearance is more pink and viable. 11/18/2022: The wound is a little smaller today. There is slough on the surface. 11/25/2022: The wound continues to contract. It appears to be epithelializing down the sides of the divot. There is slough on the surface. 12/02/2022: Although the wound measured about the same today, visually it appears smaller. There is slough on the surface and the tissue underneath appears healthier. 12/09/2022: Once again, the wound measurements were not significantly different, but visually it continues to appear smaller each week. There is some slough on the surface but there is epithelium filling in down the edges of the indentation of the wound. 12/16/2022: The wound is smaller by about one third today. There is a little bit of slough on the surface, but no other debris accumulation. 12/29/2022: The wound is smaller and shallower today. There is light slough  on the surface. 01/03/2023: The wound is smaller again today. There is a little slough on the surface, but the granulation tissue underneath is looking healthy. 01/13/2023: Continued improvement and contraction of the wound. There is more epithelialization and and very little slough on the surface. 01/21/2023: The wound was measured a little bit larger today, but I think perhaps a portion of the epithelialized area that descends down the sides of the divot in his skin was included. There is minimal slough present. 01/31/2023: A band of epithelium has grown to the center of his wound dividing it into 2 separate areas. There is minimal slough on the surface. He does have some evidence of moisture  related skin irritation just proximal to the wound. 02/07/2023: The wound is extremely clean without any slough accumulation this week. The band of epithelium is wider. 02/21/2023: Under a layer of eschar, his wound is healed. Patient History Information obtained from Patient. Family History Unknown History. Social History Current every day smoker, Marital Status - Married, Alcohol Use - Rarely, Drug Use - No History, Caffeine Use - Moderate. Medical A Surgical History Notes nd Gastrointestinal Chronic Gastritis Objective Constitutional no acute distress. Vitals Time Taken: 3:30 PM, Height: 69 in, Weight: 170 lbs, BMI: 25.1, Temperature: 98.5 F, Pulse: 66 bpm, Respiratory Rate: 18 breaths/min, Blood Pressure: 139/88 mmHg. Respiratory Randal, [name] ([id number]) [id number].pdf Page 10 of 11 Normal work of breathing on room air. General Notes: 02/21/2023: Under a layer of eschar, his wound is healed. Integumentary (Hair, Skin) Wound #1 status is Healed - Epithelialized. Original cause of wound was Trauma. The date acquired was: 10/14/2020. The wound has been in treatment 106 weeks. The wound is located on the Left Ankle. The wound measures 0cm length x 0cm width x 0cm depth; 0cm^2  area and 0cm^3 volume. There is no tunneling or undermining noted. There is a none present amount of drainage noted. The wound margin is flat and intact. There is no granulation within the wound bed. There is no necrotic tissue within the wound bed. The periwound skin appearance had no abnormalities noted for moisture. The periwound skin appearance had no abnormalities noted for color. The periwound skin appearance exhibited: Scarring. Periwound temperature was noted as No Abnormality. Assessment Active Problems ICD-10 Non-pressure chronic ulcer of left ankle with other specified severity Chronic venous hypertension (idiopathic) with ulcer and inflammation of left lower extremity Plan Discharge From [facility]: Discharge from Wound Care Center - Congratulations!!!!!!!!!!! Anesthetic: (In clinic) Topical Lidocaine 4% applied to wound bed Edema Control - Lymphedema / SCD / Other: Elevate legs to the level of the heart or above for 30 minutes daily and/or when sitting for 3-4 times a day throughout the day. - WHEN SITTING,KEEP LEGS UP. Avoid standing for long periods of time. Exercise regularly 02/21/2023: Under a layer of eschar, his wound is healed. I have recommended that he try to keep his legs elevated is much as possible during the day and at night when he is sleeping. I have given him permission to seek employment. I did suggest that he keep the area covered with a light bandage for the next couple of weeks to prevent reopening of his freshly healed site. We will discharge him from the wound care center. He may follow-up as needed. Electronic Signature(s) Signed: 02/21/2023 3:58:12 PM By: [name] FACS Entered By: Duanne Guess on 02/21/2023 15:58:12 -------------------------------------------------------------------------------- HxROS Details Patient Name: Date of Service: [name] 02/21/2023 3:45 PM Medical Record Number: [id number] Patient Account  Number: [id number] Date of Birth/Sex: Treating RN: [birth year] (47 y.o. M) Primary Care Provider: [name] Other Clinician: Referring Provider: Treating Provider/Extender: Jason Nest, Dinah Weeks in Treatment: 106 Information Obtained From Patient Gastrointestinal Medical History: Past Medical History Notes: Chronic Gastritis Ryland, [name] ([id number]) [phone].pdf Page 11 of 11 Immunizations Pneumococcal Vaccine: Received Pneumococcal Vaccination: No Implantable Devices No devices added Family and Social History Unknown History: Yes; Current every day smoker; Marital Status - Married; Alcohol Use: Rarely; Drug Use: No History; Caffeine Use: Moderate; Financial Concerns: No; Food, Clothing or Shelter Needs: No; Support System Lacking: No; Transportation Concerns: No Psychologist, prison and probation services) Signed: 02/21/2023 3:58:48 PM By: [name] FACS  Entered By: Duanne Guess on 02/21/2023 15:56:42 -------------------------------------------------------------------------------- SuperBill Details Patient Name: Date of Service: [name] 02/21/2023 Medical Record Number: [id number] Patient Account Number: [id number] Date of Birth/Sex: Treating RN: 01/27/1976 (47 y.o. [name] Primary Care Provider: [name] Other Clinician: Referring Provider: Treating Provider/Extender: Jason Nest, Dinah Weeks in Treatment: 106 Diagnosis Coding ICD-10 Codes Code Description [phone] Non-pressure chronic ulcer of left ankle with other specified severity I87.332 Chronic venous hypertension (idiopathic) with ulcer and inflammation of left lower extremity Facility Procedures : CPT4 Code: [id number] Description: 99213 - WOUND CARE VISIT-LEV 3 EST PT Modifier: Quantity: 1 Physician Procedures : CPT4 Code Description Modifier [id number] 99213 - WC PHYS LEVEL 3 - EST PT ICD-10 Diagnosis Description L97.328  Non-pressure chronic ulcer of left ankle with other specified severity I87.332 Chronic venous hypertension (idiopathic) with ulcer and  inflammation of left lower extremity Quantity: 1 Electronic Signature(s) Signed: 02/21/2023 3:58:23 PM By: [name] FACS Entered By: Duanne Guess on 02/21/2023 15:58:23

## 2023-02-21 NOTE — Progress Notes (Signed)
[name], [name] ([id number]) [id number] Page 1 of 8 Visit Report for 02/21/2023 Arrival Information Details Patient Name: Date of Service: [name] [place] NIE 02/21/2023 3:45 PM Medical Record Number: [id number] Patient Account Number: [id number] Date of Birth/Sex: Treating RN: 1976/03/24 (47 y.o. [name] Primary Care [name]: Richarda [name] Other Clinician: Referring [name]: Treating [name]/Extender: [name] in Treatment: 106 Visit Information History Since Last Visit Added or deleted any medications: No Patient Arrived: Ambulatory Any new allergies or adverse reactions: No Arrival Time: 15:29 Had a fall or experienced change in No Accompanied By: self activities of daily living that may affect Transfer Assistance: None risk of falls: Patient Identification Verified: Yes Signs or symptoms of abuse/neglect since last visito No Secondary Verification Process Completed: Yes Hospitalized since last visit: No Patient Requires Transmission-Based Precautions: No Implantable device outside of the clinic excluding No Patient Has Alerts: No cellular tissue based products placed in the center since last visit: Has Dressing in Place as Prescribed: Yes Has Compression in Place as Prescribed: Yes Pain Present Now: No Electronic Signature(s) Signed: 02/21/2023 4:45:25 PM By: [name] Entered By: [name] on 02/21/2023 15:30:01 -------------------------------------------------------------------------------- Clinic Level of Care Assessment Details Patient Name: Date of Service: [name] 02/21/2023 3:45 PM Medical Record Number: [id number] Patient Account Number: [id number] Date of Birth/Sex: Treating RN: 1976-07-24 (47 y.o. [name] Primary Care Eamon Tantillo: Richarda [name] Other Clinician: Referring Sondra Blixt: Treating Iraida Cragin/Extender: [name],  [name] in Treatment: 106 Clinic Level of Care Assessment Items TOOL 4 Quantity Score X- 1 0 Use when only [name] EandM is performed on FOLLOW-UP visit ASSESSMENTS - Nursing Assessment / Reassessment []  - 0 Reassessment of Co-morbidities (includes updates in patient status) X- 1 5 Reassessment of Adherence to Treatment Plan ASSESSMENTS - Wound and Skin A ssessment / Reassessment X - Simple Wound Assessment / Reassessment - one wound 1 5 []  - 0 Complex Wound Assessment / Reassessment - multiple wounds []  - 0 Dermatologic / Skin Assessment (not related to wound area) ASSESSMENTS - Focused Assessment X- 1 5 Circumferential Edema Measurements - multi extremities []  - 0 Nutritional Assessment / Counseling / Intervention Wiegert, [name] ([id number]) [id number] Page 2 of 8 X- 1 5 Lower Extremity Assessment (monofilament, tuning fork, pulses) []  - 0 Peripheral Arterial Disease Assessment (using hand held doppler) ASSESSMENTS - Ostomy and/or Continence Assessment and Care []  - 0 Incontinence Assessment and Management []  - 0 Ostomy Care Assessment and Management (repouching, etc.) PROCESS - Coordination of Care X - Simple Patient / Family Education for ongoing care 1 15 []  - 0 Complex (extensive) Patient / Family Education for ongoing care X- 1 10 Staff obtains Chiropractor, Records, T Results / Process Orders est []  - 0 Staff telephones HHA, Nursing Homes / Clarify orders / etc []  - 0 Routine Transfer to another Facility (non-emergent condition) []  - 0 Routine Hospital Admission (non-emergent condition) []  - 0 New Admissions / Manufacturing engineer / Ordering NPWT Apligraf, etc. , []  - 0 Emergency Hospital Admission (emergent condition) X- 1 10 Simple Discharge Coordination []  - 0 Complex (extensive) Discharge Coordination PROCESS - Special Needs []  - 0 Pediatric / Minor Patient Management []  - 0 Isolation Patient Management []  -  0 Hearing / Language / Visual special needs []  - 0 Assessment of Community assistance (transportation, D/C planning, etc.) []  - 0 Additional assistance / Altered mentation []  - 0 Support Surface(s) Assessment (bed, cushion, seat, etc.) INTERVENTIONS - Wound Cleansing / Measurement  X - Simple Wound Cleansing - one wound 1 5 []  - 0 Complex Wound Cleansing - multiple wounds X- 1 5 Wound Imaging (photographs - any number of wounds) []  - 0 Wound Tracing (instead of photographs) []  - 0 Simple Wound Measurement - one wound []  - 0 Complex Wound Measurement - multiple wounds INTERVENTIONS - Wound Dressings X - Small Wound Dressing one or multiple wounds 1 10 []  - 0 Medium Wound Dressing one or multiple wounds []  - 0 Large Wound Dressing one or multiple wounds []  - 0 Application of Medications - topical []  - 0 Application of Medications - injection INTERVENTIONS - Miscellaneous []  - 0 External ear exam []  - 0 Specimen Collection (cultures, biopsies, blood, body fluids, etc.) []  - 0 Specimen(s) / Culture(s) sent or taken to Lab for analysis []  - 0 Patient Transfer (multiple staff / Nurse, adult / Similar devices) []  - 0 Simple Staple / Suture removal (25 or less) []  - 0 Complex Staple / Suture removal (26 or more) []  - 0 Hypo / Hyperglycemic Management (close monitor of Blood Glucose) [name], [name] ([id number]) [id number] Page 3 of 8 []  - 0 Ankle / Brachial Index (ABI) - do not check if billed separately X- 1 5 Vital Signs Has the patient been seen at the hospital within the last three years: Yes Total Score: 80 Level Of Care: New/Established - Level 3 Electronic Signature(s) Signed: 02/21/2023 4:45:25 PM By: [name] Entered By: [name] on 02/21/2023 15:53:44 -------------------------------------------------------------------------------- Encounter Discharge Information Details Patient Name: Date of Service: [name]  [name] [name] 02/21/2023 3:45 PM Medical Record Number: [id number] Patient Account Number: [id number] Date of Birth/Sex: Treating RN: [birth year] (47 y.o. [name] Primary Care Keirsten Matuska: Richarda [name] Other Clinician: Referring Kimaya Whitlatch: Treating Alexandrea Westergard/Extender: [name] in Treatment: [phone] Encounter Discharge Information Items Discharge Condition: Stable Ambulatory Status: Ambulatory Discharge Destination: Home Transportation: Private Auto Accompanied By: self Schedule Follow-up Appointment: No Clinical Summary of Care: Patient Declined Electronic Signature(s) Signed: 02/21/2023 4:45:25 PM By: [name] Entered By: [name] on 02/21/2023 15:54:10 -------------------------------------------------------------------------------- Lower Extremity Assessment Details Patient Name: Date of Service: [name] 02/21/2023 3:45 PM Medical Record Number: [id number] Patient Account Number: [id number] Date of Birth/Sex: Treating RN: [birth year] (47 y.o. [name] Primary Care Rahmon Heigl: Richarda [name] Other Clinician: Referring Hortence Charter: Treating Jamani Bearce/Extender: [name], [name] in Treatment: 106 Edema Assessment Assessed: [Left: No] [Right: No] Edema: [Left: Ye] [Right: s] Calf Left: Right: Point of Measurement: 28 cm From Medial Instep 29 cm Ankle Left: Right: Point of Measurement: 8 cm From Medial Instep 21.5 cm Vascular Assessment Left: [id number] Page 4 of 8Right:] Pulses: Dorsalis Pedis Palpable: [phone].pdf Page 4 of 8Yes] Electronic Signature(s) Signed: 02/21/2023 4:45:25 PM By: [name] Entered By: [name] on 02/21/2023 15:37:14 -------------------------------------------------------------------------------- Multi Wound Chart Details Patient Name: Date of Service: [name]  02/21/2023 3:45 PM Medical Record Number: [id number] Patient Account Number: [id number] Date of Birth/Sex: Treating RN: [birth year] (47 y.o. M) Primary Care Kindell Strada: Richarda [name] Other Clinician: Referring Rachit Grim: Treating Carlas Vandyne/Extender: Jason Nest, [name] in Treatment: 106 Vital Signs Height(in): 69 Pulse(bpm): 66 Weight(lbs): 170 Blood Pressure(mmHg): 139/88 Body Mass Index(BMI): 25.1 Temperature(F): 98.5 Respiratory Rate(breaths/min): 18 [1:Photos:] [N/A:N/A] Left Ankle N/A N/A Wound Location: Trauma N/A N/A Wounding Event: Venous Leg Ulcer N/A N/A Primary Etiology: 10/14/2020 N/A N/A Date Acquired: 106 N/A N/A [name] of Treatment: Healed - Epithelialized N/A N/A Wound Status: No N/A N/A  Wound Recurrence: 0x0x0 N/A N/A Measurements L x W x D (cm) 0 N/A N/A A (cm) : rea 0 N/A N/A Volume (cm) : 100.00% N/A N/A % Reduction in A rea: 100.00% N/A N/A % Reduction in Volume: Full Thickness Without Exposed N/A N/A Classification: Support Structures None Present N/A N/A Exudate Amount: Flat and Intact N/A N/A Wound Margin: None Present (0%) N/A N/A Granulation Amount: None Present (0%) N/A N/A Necrotic Amount: Fascia: No N/A N/A Exposed Structures: Fat Layer (Subcutaneous Tissue): No Tendon: No Muscle: No Joint: No Bone: No Large (67-100%) N/A N/A Epithelialization: Scarring: Yes N/A N/A Periwound Skin Texture: Maceration: No N/A N/A Periwound Skin Moisture: Dry/Scaly: No No Abnormalities Noted N/A N/A Periwound Skin Color: No Abnormality N/A N/A Temperature: Treatment Notes [name], [name] ([id number]) [id number] Page 5 of 8 Electronic Signature(s) Signed: 02/21/2023 3:56:03 PM By: [name] Guess MD FACS Entered By: [name] Guess on 02/21/2023 15:56:02 -------------------------------------------------------------------------------- Multi-Disciplinary Care Plan Details Patient Name: Date of  Service: [name] 02/21/2023 3:45 PM Medical Record Number: [id number] Patient Account Number: [id number] Date of Birth/Sex: Treating RN: 11/10/1975 (47 y.o. [name] Primary Care Aprile Dickenson: Richarda [name] Other Clinician: Referring Roben Schliep: Treating Quindon Denker/Extender: [name] in Treatment: [phone] Multidisciplinary Care Plan reviewed with physician Active Inactive Electronic Signature(s) Signed: 02/21/2023 4:45:25 PM By: [name] Entered By: [name] on 02/21/2023 15:53:00 -------------------------------------------------------------------------------- Pain Assessment Details Patient Name: Date of Service: [name] 02/21/2023 3:45 PM Medical Record Number: [id number] Patient Account Number: [id number] Date of Birth/Sex: Treating RN: 10/06/1975 (47 y.o. [name] Primary Care Mackena Plummer: Richarda [name] Other Clinician: Referring Natalyah Cummiskey: Treating Hayly Litsey/Extender: Jason Nest, [name] in Treatment: 106 Active Problems Location of Pain Severity and Description of Pain Patient Has Paino No Site Locations Rate the pain. Current Pain Level: 0 Pain Management and Medication Current Pain Management: Wonder, [name] ([id number]) [id number] Page 6 of 8 Electronic Signature(s) Signed: 02/21/2023 4:45:25 PM By: Gelene Mink By: [name] on 02/21/2023 15:30:24 -------------------------------------------------------------------------------- Patient/Caregiver Education Details Patient Name: Date of Service: [name] 7/22/2024andnbsp3:45 PM Medical Record Number: [id number] Patient Account Number: [id number] Date of Birth/Gender: Treating RN: [birth year] (47 y.o. [name] Primary Care Physician: Richarda [name] Other Clinician: Referring Physician: Treating Physician/Extender: [name] in Treatment: [phone] Education Assessment Education Provided To: Patient Education Topics Provided Safety: Methods: Explain/Verbal Responses: Reinforcements needed, State content correctly Electronic Signature(s) Signed: 02/21/2023 4:45:25 PM By: [name] Entered By: [name] on 02/21/2023 15:53:17 -------------------------------------------------------------------------------- Wound Assessment Details Patient Name: Date of Service: [name] 02/21/2023 3:45 PM Medical Record Number: [id number] Patient Account Number: [id number] Date of Birth/Sex: Treating RN: [birth year] (47 y.o. [name] Primary Care Ashkan Chamberland: Richarda [name] Other Clinician: Referring Alyrica Thurow: Treating Monee Dembeck/Extender: Jason Nest, [name] in Treatment: 106 Wound Status Wound Number: 1 Primary Etiology: Venous Leg Ulcer Wound Location: Left Ankle Wound Status: Healed - Epithelialized Wounding Event: Trauma Date Acquired: 10/14/2020 [name] Of Treatment: 106 Clustered Wound: No Photos [name], [name] ([id number]) [id number] Page 7 of 8 Wound Measurements Length: (cm) Width: (cm) Depth: (cm) Area: (cm) Volume: (cm) 0 % Reduction in Area: 100% 0 % Reduction in Volume: 100% 0 Epithelialization: Large (67-100%) 0 Tunneling: No 0 Undermining: No Wound Description Classification: Full Thickness Without Exposed Support Structures Wound Margin: Flat and Intact Exudate Amount: None Present Foul Odor After Cleansing: No Slough/Fibrino No Wound Bed Granulation Amount: None Present (0%) Exposed Structure  Necrotic Amount: None Present (0%) Fascia Exposed: No Fat Layer (Subcutaneous Tissue) Exposed: No Tendon Exposed: No Muscle Exposed: No Joint Exposed: No Bone Exposed: No Periwound Skin Texture Texture Color No Abnormalities Noted: No No Abnormalities Noted: Yes Scarring: Yes Temperature /  Pain Temperature: No Abnormality Moisture No Abnormalities Noted: Yes Electronic Signature(s) Signed: 02/21/2023 4:45:25 PM By: [name] Entered By: [name] on 02/21/2023 15:45:46 -------------------------------------------------------------------------------- Vitals Details Patient Name: Date of Service: [name] 02/21/2023 3:45 PM Medical Record Number: [id number] Patient Account Number: [id number] Date of Birth/Sex: Treating RN: 01-05-1976 (47 y.o. [name] Primary Care Earma Nicolaou: Richarda [name] Other Clinician: Referring Dayten Juba: Treating Linell Shawn/Extender: [name], [name] in Treatment: 106 Vital Signs Time Taken: 15:30 Temperature (F): 98.5 Height (in): 69 Pulse (bpm): 66 Weight (lbs): 170 Respiratory Rate (breaths/min): 18 Body Mass Index (BMI): 25.1 Blood Pressure (mmHg): 139/88 Reference Range: 80 - 120 mg / dl Electronic Signature(s) Signed: 02/21/2023 4:45:25 PM By: [name] [name], [name] ([id number]) [id number] Page 8 of 8 Entered By: [name] on 02/21/2023 15:30:18

## 2023-02-24 ENCOUNTER — Other Ambulatory Visit: Payer: Medicaid Other

## 2023-02-24 NOTE — Patient Instructions (Signed)
Visit Information  [name] was given information about Medicaid Managed Care team care coordination services as a part of their [facility] Medicaid benefit. [name] verbally consented to engagement with the [facility] Managed Care team.   If you are experiencing a medical emergency, please call 911 or report to your local emergency department or urgent care.   If you have a non-emergency medical problem during routine business hours, please contact your provider's office and ask to speak with a nurse.   For questions related to your [facility] health plan, please call: [phone] or go here:[email or web address]  If you would like to schedule transportation through your [facility] plan, please call the following number at least 2 days in advance of your appointment: [phone].   You can also use the MTM portal or MTM mobile app to manage your rides. Reimbursement for transportation is available through [facility]! For the portal, please go to [email or web address]  Call the [facility] Crisis Line at [phone], at any time, 24 hours a day, 7 days a week. If you are in danger or need immediate medical attention call 911.  If you would like help to quit smoking, call 1-800-QUIT-NOW ((309)708-5866) OR Espaol: 1-855-Djelo-Ya (4-403-474-2595) o para ms informacin haga clic aqu or Text READY to 638-756 to register via text  [name] - following are the goals we discussed in your visit today:   Goals Addressed   None      The  Patient                                              has been provided with contact information for the Managed Medicaid care management team and has been advised to call with any health related questions or concerns.   [name], [name], MHA [facility]  Managed Medicaid Social Worker [phone]   Following is a copy of your plan of care:  There are no care plans that you recently modified to display for  this patient.

## 2023-02-24 NOTE — Patient Outreach (Signed)
  Medicaid Managed Care Social Work Note  02/24/2023 Name:  [name] MRN:  [id number] DOB:  [birth year]  [name] is an 47 y.o. year old male who is a primary patient of [name], [name], NP.  The [facility] Managed Care Coordination team was consulted for assistance with:  Community Resources   [name] was given information about Medicaid Managed Care Coordination team services today. [name] Patient agreed to services and verbal consent obtained.  Engaged with patient  for by telephone forfollow up visit in response to referral for case management and/or care coordination services.   Assessments/Interventions:  Review of past medical history, allergies, medications, health status, including review of consultants reports, laboratory and other test data, was performed as part of comprehensive evaluation and provision of chronic care management services.  SDOH: (Social Determinant of Health) assessments and interventions performed: SDOH Interventions    Flowsheet Row Patient Outreach Telephone from 10/04/2022 in [place] POPULATION HEALTH DEPARTMENT Patient Outreach Telephone from 07/27/2022 in [place] POPULATION HEALTH DEPARTMENT  SDOH Interventions    Food Insecurity Interventions -- Intervention Not Indicated  Housing Interventions -- Other (Comment)  [BSW referral, scheduled on 07/28/22 at 3pm]  Transportation Interventions -- Intervention Not Indicated  Financial Strain Interventions Other (Comment)  [referral to BSW] --     BSW completed a telephone outreach with patient using pacific interpreter. Patient states he has not applied for foodstamps yet, he did go to the office and they encouraged him to apply over the phone. Patient stated he was unable to get through on the phone or online. BSW encouraged patient to try going back to the office to apply and use the food pantries until he starts receiving foodstamps. No other resources are  needed.  Advanced Directives Status:  Not addressed in this encounter.  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   Follow up:  Patient agrees to Care Plan and Follow-up.  Plan: The  Patient has been provided with contact information for the Managed Medicaid care management team and has been advised to call with any health related questions or concerns.    [name], MHA [facility]  Managed [facility] Social Worker [phone]

## 2023-03-03 DIAGNOSIS — Z419 Encounter for procedure for purposes other than remedying health state, unspecified: Secondary | ICD-10-CM | POA: Diagnosis not present

## 2023-03-21 ENCOUNTER — Other Ambulatory Visit: Payer: Medicaid Other | Admitting: *Deleted

## 2023-03-21 ENCOUNTER — Encounter: Payer: Self-pay | Admitting: *Deleted

## 2023-03-21 NOTE — Patient Outreach (Signed)
   Care Management/Care Coordination  RN Case Manager Case Closure Note  03/21/2023 Name: [name] MRN: [id number] DOB: 01-Mar-1976  [name] is a 47 y.o. year old male who is a primary care patient of [name], [name], NP. The care management/care coordination team was consulted for assistance with chronic disease management and/or care coordination needs.   Care Plan : RN Care Manager Plan of Care  Updates made by Heidi Dach, RN since 03/21/2023 12:00 AM     Problem: Health Management needs related to Wound Care      Long-Range Goal: Development of Plan of Care to address Health Management needs related to Wound Care Completed 03/21/2023  Start Date: 07/27/2022  Expected End Date: 03/21/2023  Priority: High  Note:   Current Barriers:  Knowledge Deficits related to plan of care for management of Wound Management  Financial Constraints.   RNCM Clinical Goal(s):  Patient will verbalize understanding of plan for management of Wound Care as evidenced by patient reports attend all scheduled medical appointments: reschedule missed visit with Wound Care as evidenced by provider EMR documentation        work with social worker to address Financial constraints related to housing related to the management of Wound Care as evidenced by review of EMR and patient or social worker report       Interventions: Inter-disciplinary care team collaboration (see longitudinal plan of care) Evaluation of current treatment plan related to  self management and patient's adherence to plan as established by provider  Discussed Case Closure, advised patient to request CM referral with PCP should new needs arise to managing his health Visit made while utilizing Swahili interpreter [phone], via [employer]    Wound Care  (Status: Goal Met.) Long Term Goal  Evaluation of current treatment plan related to  Wound Care , Financial constraints related to affording housing   self-management and patient's adherence to plan as established by provider. Discussed plans with patient for ongoing care management follow up and provided patient with direct contact information for care management team Assessed social determinant of health barriers;  Discussed wound care-patient states it is healing and getting much better Collaborated with BSW for assistance with food benefits and disability application Reviewed medications Advised patient to follow up on food stamp application with Social Services  Patient Goals/Self-Care Activities: Attend all scheduled provider appointments Work with the social worker to address care coordination needs and will continue to work with the clinical team to address health care and disease management related needs       Plan: The patient has met all care management goals, agreed to case closure, and has been provided with contact information for the care management team. Appropriate care team members and provider have been notified via electronic communication. The care management team is available to at any time in the future should needs arise.   [name] RN, BSN [place]  Managed [facility] RN Care Coordinator [phone]

## 2023-04-03 DIAGNOSIS — Z419 Encounter for procedure for purposes other than remedying health state, unspecified: Secondary | ICD-10-CM | POA: Diagnosis not present

## 2023-04-13 ENCOUNTER — Ambulatory Visit (INDEPENDENT_AMBULATORY_CARE_PROVIDER_SITE_OTHER): Payer: Medicaid Other | Admitting: Family

## 2023-04-13 ENCOUNTER — Encounter: Payer: Self-pay | Admitting: Family

## 2023-04-13 VITALS — BP 110/76 | HR 55 | Temp 97.8°F | Resp 16 | Ht 69.0 in | Wt 163.0 lb

## 2023-04-13 DIAGNOSIS — K219 Gastro-esophageal reflux disease without esophagitis: Secondary | ICD-10-CM

## 2023-04-13 DIAGNOSIS — Z23 Encounter for immunization: Secondary | ICD-10-CM | POA: Diagnosis not present

## 2023-04-13 DIAGNOSIS — Z0289 Encounter for other administrative examinations: Secondary | ICD-10-CM | POA: Diagnosis not present

## 2023-04-13 NOTE — Patient Instructions (Signed)
Left leg wound is healed may return to work on 04/18/2023 as requested.

## 2023-04-13 NOTE — Progress Notes (Signed)
Provider: [name] FNP-C   Adelfa Lozito, [name], NP  Patient Care Team: Shanya Ferriss, [name], NP as PCP - General (Family Medicine) [name], DO (Family Medicine) Heidi Dach, RN as Case Manager  Extended Emergency Contact Information Primary Emergency Contact: nyiramutuzo,donatha Mobile Phone: [phone] Relation: Spouse Preferred language: [place]; [place] Interpreter needed? Yes Secondary Emergency Contact: Griffine,Laquisha Mobile Phone: [phone] Relation: Other  Code Status:  Full Code  Goals of care: Advanced Directive information    04/13/2023    8:42 AM  Advanced Directives  Does Patient Have a Medical Advance Directive? No     Chief Complaint  Patient presents with   Follow-up    3 month follow up    HPI:  Pt is a 47 y.o. male seen today for 3 months follow up for medical management of chronic diseases. Here with Patrcia Dolly Canon swahili interpreter.  He states would like to return to work on 04/18/2023.states has been cleared by wound clinic since previous left leg wound has healed.Just request a Placard sticker due to inability to walk too far due to previous left foot wound.  He denies any other acute issues.    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 daily x6 months with B6 25 mg p.o. daily.  Unsure if patient completed this course.   Past Surgical History:  Procedure Laterality Date   FOOT SURGERY      No Known Allergies  Allergies as of 04/13/2023   No Known Allergies      Medication List        Accurate as of April 13, 2023  9:01 AM. If you have any questions, ask your nurse or doctor.          buPROPion 150 MG 12 hr tablet Commonly known as: Wellbutrin SR Take 1 tablet (150 mg total) by mouth daily.   pantoprazole 40 MG tablet Commonly known as: PROTONIX Take 1 tablet (40 mg total) by  mouth daily.        Review of Systems  Constitutional:  Negative for appetite change, chills, fatigue, fever and unexpected weight change.  HENT:  Negative for congestion, dental problem, ear discharge, ear pain, facial swelling, hearing loss, nosebleeds, postnasal drip, rhinorrhea, sinus pressure, sinus pain, sneezing, sore throat, tinnitus and trouble swallowing.   Eyes:  Negative for pain, discharge, redness, itching and visual disturbance.  Respiratory:  Negative for cough, chest tightness, shortness of breath and wheezing.   Cardiovascular:  Negative for chest pain, palpitations and leg swelling.  Gastrointestinal:  Negative for abdominal distention, abdominal pain, constipation, diarrhea, nausea and vomiting.  Endocrine: Negative for cold intolerance, heat intolerance, polydipsia, polyphagia and polyuria.  Genitourinary:  Negative for difficulty urinating, dysuria, flank pain, frequency and urgency.  Musculoskeletal:  Negative for arthralgias, back pain, gait problem, joint swelling, myalgias, neck pain and neck stiffness.       Left leg previous wound healed has been released by wound care center   Skin:  Negative for color change, pallor, rash and wound.  Neurological:  Negative for dizziness, syncope, speech difficulty, weakness, light-headedness, numbness and headaches.  Hematological:  Does not bruise/bleed easily.  Psychiatric/Behavioral:  Negative for agitation, behavioral problems, confusion, hallucinations, self-injury, sleep disturbance and suicidal ideas. The patient is not nervous/anxious.     Immunization History  Administered Date(s) Administered   Tdap  12/01/2022   Pertinent  Health Maintenance Due  Topic Date Due   Colonoscopy  Never done   INFLUENZA VACCINE  Never done      05/18/2021   11:06 AM 04/25/2022    2:19 PM 05/01/2022    6:39 PM 05/02/2022    8:49 AM 04/13/2023    8:42 AM  Fall Risk  Falls in the past year? 0    0  Was there an injury with Fall? 0       Fall Risk Category Calculator 0      Fall Risk Category (Retired) Low      (RETIRED) Patient Fall Risk Level Low fall risk Low fall risk Low fall risk Low fall risk    Functional Status Survey:    Vitals:   04/13/23 0851  BP: 110/76  Pulse: (!) 55  Resp: 16  Temp: 97.8 F (36.6 C)  SpO2: 98%  Weight: 163 lb (73.9 kg)  Height: 5\' 9"  (1.753 m)   Body mass index is 24.07 kg/m. Physical Exam Vitals reviewed.  Constitutional:      General: He is not in acute distress.    Appearance: Normal appearance. He is normal weight. He is not ill-appearing or diaphoretic.  HENT:     Head: Normocephalic.     Right Ear: Tympanic membrane, ear canal and external ear normal. There is no impacted cerumen.     Left Ear: Tympanic membrane, ear canal and external ear normal. There is no impacted cerumen.     Nose: Nose normal. No congestion or rhinorrhea.     Mouth/Throat:     Mouth: Mucous membranes are moist.     Pharynx: Oropharynx is clear. No oropharyngeal exudate or posterior oropharyngeal erythema.  Eyes:     General: No scleral icterus.       Right eye: No discharge.        Left eye: No discharge.     Extraocular Movements: Extraocular movements intact.     Conjunctiva/sclera: Conjunctivae normal.     Pupils: Pupils are equal, round, and reactive to light.  Neck:     Vascular: No carotid bruit.  Cardiovascular:     Rate and Rhythm: Normal rate and regular rhythm.     Pulses: Normal pulses.     Heart sounds: Normal heart sounds. No murmur heard.    No friction rub. No gallop.  Pulmonary:     Effort: Pulmonary effort is normal. No respiratory distress.     Breath sounds: Normal breath sounds. No wheezing, rhonchi or rales.  Chest:     Chest wall: No tenderness.  Abdominal:     General: Bowel sounds are normal. There is no distension.     Palpations: Abdomen is soft. There is no mass.     Tenderness: There is no abdominal tenderness. There is no right CVA tenderness, left  CVA tenderness, guarding or rebound.  Musculoskeletal:        General: No swelling or tenderness. Normal range of motion.     Cervical back: Normal range of motion. No rigidity or tenderness.     Right lower leg: No edema.     Left lower leg: No edema.  Lymphadenopathy:     Cervical: No cervical adenopathy.  Skin:    General: Skin is warm and dry.     Coloration: Skin is not pale.     Findings: No bruising, erythema, lesion or rash.     Comments: Left dorsal foot previous wound site skin pink  in color and non-tender to touch.   Neurological:     Mental Status: He is alert and oriented to person, place, and time.     Cranial Nerves: No cranial nerve deficit.     Sensory: No sensory deficit.     Motor: No weakness.     Coordination: Coordination normal.     Gait: Gait abnormal.  Psychiatric:        Mood and Affect: Mood normal.        Speech: Speech normal.        Behavior: Behavior normal.        Thought Content: Thought content normal.        Judgment: Judgment normal.     Labs reviewed: Recent Labs    04/25/22 1419 05/01/22 2052 05/02/22 0947 12/01/22 1138  NA 140 143  --  142  K 3.6 2.8*  --  3.9  CL 116* 111  --  112*  CO2 19* 23  --  20  GLUCOSE 98 90  --  99  BUN 9 12  --  12  CREATININE 1.07 1.23  --  0.89  CALCIUM 9.4 9.4  --  9.9  MG  --   --  2.2  --    Recent Labs    05/01/22 2052 12/01/22 1138  AST 21 15  ALT 16 13  ALKPHOS 53  --   BILITOT 0.6 0.5  PROT 6.5 7.1  ALBUMIN 3.8  --    Recent Labs    04/25/22 1419 05/01/22 2258 12/01/22 1138  WBC 4.0 4.2 6.6  NEUTROABS  --   --  4,297  HGB 15.3 15.3 16.2  HCT 46.1 45.4 47.3  MCV 100.4* 97.0 98.1  PLT 219 219 259   Lab Results  Component Value Date   TSH 0.793 04/07/2021   No results found for: "HGBA1C" Lab Results  Component Value Date   CHOL 119 12/01/2022   HDL 38 (L) 12/01/2022   LDLCALC 58 12/01/2022   TRIG 146 12/01/2022   CHOLHDL 3.1 12/01/2022    Significant Diagnostic  Results in last 30 days:  No results found.  Assessment/Plan 1. Gastroesophageal reflux disease without esophagitis Previous H/H stable previous Epigastric pain has resolved after treatment of H.Pylori Symptoms controlled. H/H stable.No tarry or black stool  - advised to avoid eating meals late in the evening and to avoid aggravating foods and spices. - continue on Protonix  - COMPLETE METABOLIC PANEL WITH GFR - CBC with Differential/Platelet  2. Need for influenza vaccination Afebrile  Flut shot administered by CMA no acute reaction reported.  - Flu vaccine trivalent PF, 6mos and older(Flulaval,Afluria,Fluarix,Fluzone)  3. Encounter for completion of form with patient Placard form completed due to inability to ambulated or stand for prolong period.  Work letter also written to return to work as requested on 04/18/2023.left foot wound has healed.   Family/ staff Communication: Reviewed plan of care with patient verbalized understanding   Labs/tests ordered:  - COMPLETE METABOLIC PANEL WITH GFR - CBC with Differential/Platelet  Next Appointment : Return in about 6 months (around 10/11/2023) for medical mangement of chronic issues.[name], NP

## 2023-04-14 LAB — COMPLETE METABOLIC PANEL WITH GFR
AG Ratio: 1.6 (calc) (ref 1.0–2.5)
ALT: 15 U/L (ref 9–46)
AST: 16 U/L (ref 10–40)
Albumin: 4.1 g/dL (ref 3.6–5.1)
Alkaline phosphatase (APISO): 64 U/L (ref 36–130)
BUN: 14 mg/dL (ref 7–25)
CO2: 21 mmol/L (ref 20–32)
Calcium: 8.8 mg/dL (ref 8.6–10.3)
Chloride: 114 mmol/L — ABNORMAL HIGH (ref 98–110)
Creat: 1.18 mg/dL (ref 0.60–1.29)
Globulin: 2.6 g/dL (ref 1.9–3.7)
Glucose, Bld: 87 mg/dL (ref 65–99)
Potassium: 4 mmol/L (ref 3.5–5.3)
Sodium: 141 mmol/L (ref 135–146)
Total Bilirubin: 0.7 mg/dL (ref 0.2–1.2)
Total Protein: 6.7 g/dL (ref 6.1–8.1)
eGFR: 76 mL/min/{1.73_m2} (ref >=60)

## 2023-04-14 LAB — CBC WITH DIFFERENTIAL/PLATELET
Absolute Monocytes: 320 {cells}/uL (ref 200–950)
Basophils Absolute: 52 {cells}/uL (ref 0–200)
Basophils Relative: 1.1 %
Eosinophils Absolute: 160 {cells}/uL (ref 15–500)
Eosinophils Relative: 3.4 %
HCT: 46.1 % (ref 38.5–50.0)
Hemoglobin: 15.5 g/dL (ref 13.2–17.1)
Lymphs Abs: 1560 {cells}/uL (ref 850–3900)
MCH: 33.5 pg — ABNORMAL HIGH (ref 27.0–33.0)
MCHC: 33.6 g/dL (ref 32.0–36.0)
MCV: 99.6 fL (ref 80.0–100.0)
MPV: 9.7 fL (ref 7.5–12.5)
Monocytes Relative: 6.8 %
Neutro Abs: 2609 {cells}/uL (ref 1500–7800)
Neutrophils Relative %: 55.5 %
Platelets: 200 10*3/uL (ref 140–400)
RBC: 4.63 10*6/uL (ref 4.20–5.80)
RDW: 11.4 % (ref 11.0–15.0)
Total Lymphocyte: 33.2 %
WBC: 4.7 10*3/uL (ref 3.8–10.8)

## 2023-04-20 ENCOUNTER — Other Ambulatory Visit: Payer: Self-pay

## 2023-04-20 ENCOUNTER — Emergency Department (HOSPITAL_COMMUNITY): Payer: No Typology Code available for payment source

## 2023-04-20 ENCOUNTER — Emergency Department (HOSPITAL_COMMUNITY)
Admission: EM | Admit: 2023-04-20 | Discharge: 2023-04-21 | Disposition: A | Discharge status: Home / Self Care | Payer: No Typology Code available for payment source | Attending: Emergency Medicine | Admitting: Emergency Medicine

## 2023-04-20 DIAGNOSIS — S299XXA Unspecified injury of thorax, initial encounter: Secondary | ICD-10-CM | POA: Diagnosis present

## 2023-04-20 DIAGNOSIS — Y92410 Unspecified street and highway as the place of occurrence of the external cause: Secondary | ICD-10-CM | POA: Diagnosis not present

## 2023-04-20 DIAGNOSIS — R5381 Other malaise: Secondary | ICD-10-CM | POA: Diagnosis not present

## 2023-04-20 DIAGNOSIS — S3991XA Unspecified injury of abdomen, initial encounter: Secondary | ICD-10-CM | POA: Diagnosis not present

## 2023-04-20 DIAGNOSIS — J439 Emphysema, unspecified: Secondary | ICD-10-CM | POA: Diagnosis not present

## 2023-04-20 DIAGNOSIS — S2220XA Unspecified fracture of sternum, initial encounter for closed fracture: Secondary | ICD-10-CM | POA: Insufficient documentation

## 2023-04-20 DIAGNOSIS — R109 Unspecified abdominal pain: Secondary | ICD-10-CM | POA: Diagnosis not present

## 2023-04-20 DIAGNOSIS — R9389 Abnormal findings on diagnostic imaging of other specified body structures: Secondary | ICD-10-CM | POA: Diagnosis not present

## 2023-04-20 DIAGNOSIS — S0990XA Unspecified injury of head, initial encounter: Secondary | ICD-10-CM | POA: Insufficient documentation

## 2023-04-20 DIAGNOSIS — S199XXA Unspecified injury of neck, initial encounter: Secondary | ICD-10-CM | POA: Diagnosis not present

## 2023-04-20 DIAGNOSIS — J432 Centrilobular emphysema: Secondary | ICD-10-CM | POA: Diagnosis not present

## 2023-04-20 DIAGNOSIS — R0789 Other chest pain: Secondary | ICD-10-CM | POA: Diagnosis not present

## 2023-04-20 DIAGNOSIS — T1490XA Injury, unspecified, initial encounter: Secondary | ICD-10-CM | POA: Diagnosis not present

## 2023-04-20 DIAGNOSIS — S2222XA Fracture of body of sternum, initial encounter for closed fracture: Secondary | ICD-10-CM | POA: Diagnosis not present

## 2023-04-20 DIAGNOSIS — Z743 Need for continuous supervision: Secondary | ICD-10-CM | POA: Diagnosis not present

## 2023-04-20 NOTE — ED Triage Notes (Signed)
C collar placed in triage
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
## 2023-04-21 ENCOUNTER — Emergency Department (HOSPITAL_COMMUNITY): Payer: No Typology Code available for payment source

## 2023-04-21 ENCOUNTER — Encounter (HOSPITAL_COMMUNITY): Payer: Self-pay

## 2023-04-21 DIAGNOSIS — S0990XA Unspecified injury of head, initial encounter: Secondary | ICD-10-CM | POA: Diagnosis not present

## 2023-04-21 DIAGNOSIS — R109 Unspecified abdominal pain: Secondary | ICD-10-CM | POA: Diagnosis not present

## 2023-04-21 DIAGNOSIS — S2220XA Unspecified fracture of sternum, initial encounter for closed fracture: Secondary | ICD-10-CM | POA: Diagnosis not present

## 2023-04-21 DIAGNOSIS — J432 Centrilobular emphysema: Secondary | ICD-10-CM | POA: Diagnosis not present

## 2023-04-21 DIAGNOSIS — R0789 Other chest pain: Secondary | ICD-10-CM | POA: Diagnosis not present

## 2023-04-21 DIAGNOSIS — J439 Emphysema, unspecified: Secondary | ICD-10-CM | POA: Diagnosis not present

## 2023-04-21 DIAGNOSIS — S199XXA Unspecified injury of neck, initial encounter: Secondary | ICD-10-CM | POA: Diagnosis not present

## 2023-04-21 LAB — CBC
HCT: 48.2 % (ref 39.0–52.0)
Hemoglobin: 16.3 g/dL (ref 13.0–17.0)
MCH: 33.3 pg (ref 26.0–34.0)
MCHC: 33.8 g/dL (ref 30.0–36.0)
MCV: 98.6 fL (ref 80.0–100.0)
Platelets: 243 K/uL (ref 150–400)
RBC: 4.89 MIL/uL (ref 4.22–5.81)
RDW: 11.6 % (ref 11.5–15.5)
WBC: 8.1 K/uL (ref 4.0–10.5)
nRBC: 0 % (ref 0.0–0.2)

## 2023-04-21 LAB — BASIC METABOLIC PANEL WITH GFR
Anion gap: 8 (ref 5–15)
BUN: 9 mg/dL (ref 6–20)
CO2: 21 mmol/L — ABNORMAL LOW (ref 22–32)
Calcium: 8.9 mg/dL (ref 8.9–10.3)
Chloride: 109 mmol/L (ref 98–111)
Creatinine, Ser: 1.07 mg/dL (ref 0.61–1.24)
GFR, Estimated: >60 mL/min
Glucose, Bld: 100 mg/dL — ABNORMAL HIGH (ref 70–99)
Potassium: 3.7 mmol/L (ref 3.5–5.1)
Sodium: 138 mmol/L (ref 135–145)

## 2023-04-21 LAB — TROPONIN I (HIGH SENSITIVITY)
Troponin I (High Sensitivity): 3 ng/L (ref <18)
Troponin I (High Sensitivity): 3 ng/L (ref <18)

## 2023-04-21 MED ORDER — MORPHINE SULFATE (PF) 4 MG/ML IV SOLN
4.0000 mg | Freq: Once | INTRAVENOUS | Status: AC
  Administered 2023-04-21: 4 mg via INTRAVENOUS
  Filled 2023-04-21: qty 1

## 2023-04-21 MED ORDER — SODIUM CHLORIDE (PF) 0.9 % IJ SOLN
INTRAMUSCULAR | Status: AC
  Filled 2023-04-21: qty 50

## 2023-04-21 MED ORDER — IBUPROFEN 600 MG PO TABS: 600.0000 mg | ORAL_TABLET | Freq: Four times a day (QID) | ORAL | 0 refills | Status: DC | PRN

## 2023-04-21 MED ORDER — IOHEXOL 300 MG/ML  SOLN
75.0000 mL | Freq: Once | INTRAMUSCULAR | Status: AC | PRN
  Administered 2023-04-21: 75 mL via INTRAVENOUS

## 2023-04-21 MED ORDER — HYDROCODONE-ACETAMINOPHEN 5-325 MG PO TABS: 1.0000 | ORAL_TABLET | Freq: Four times a day (QID) | ORAL | 0 refills | Status: DC | PRN

## 2023-04-21 NOTE — ED Notes (Signed)
Pt provided urine sample
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
## 2023-05-02 ENCOUNTER — Encounter: Payer: Medicaid Other | Admitting: Family

## 2023-05-03 DIAGNOSIS — Z419 Encounter for procedure for purposes other than remedying health state, unspecified: Secondary | ICD-10-CM | POA: Diagnosis not present

## 2023-05-04 ENCOUNTER — Encounter: Payer: Medicaid Other | Admitting: Family

## 2023-05-05 NOTE — Progress Notes (Signed)
  This encounter was created in error - please disregard. No show 

## 2023-05-20 ENCOUNTER — Encounter: Payer: Self-pay | Admitting: Family

## 2023-05-20 ENCOUNTER — Ambulatory Visit (INDEPENDENT_AMBULATORY_CARE_PROVIDER_SITE_OTHER): Payer: Medicaid Other | Admitting: Family

## 2023-05-20 VITALS — BP 120/70 | HR 69 | Temp 98.0°F | Resp 14 | Ht 69.0 in | Wt 156.0 lb

## 2023-05-20 DIAGNOSIS — R0789 Other chest pain: Secondary | ICD-10-CM | POA: Diagnosis not present

## 2023-05-20 DIAGNOSIS — K219 Gastro-esophageal reflux disease without esophagitis: Secondary | ICD-10-CM | POA: Diagnosis not present

## 2023-05-20 LAB — CBC WITH DIFFERENTIAL/PLATELET
Absolute Lymphocytes: 1226 {cells}/uL (ref 850–3900)
Absolute Monocytes: 340 {cells}/uL (ref 200–950)
Basophils Absolute: 21 {cells}/uL (ref 0–200)
Basophils Relative: 0.5 %
Eosinophils Absolute: 98 {cells}/uL (ref 15–500)
Eosinophils Relative: 2.4 %
HCT: 44 % (ref 38.5–50.0)
Hemoglobin: 14.8 g/dL (ref 13.2–17.1)
MCH: 33 pg (ref 27.0–33.0)
MCHC: 33.6 g/dL (ref 32.0–36.0)
MCV: 98 fL (ref 80.0–100.0)
MPV: 10.1 fL (ref 7.5–12.5)
Monocytes Relative: 8.3 %
Neutro Abs: 2415 {cells}/uL (ref 1500–7800)
Neutrophils Relative %: 58.9 %
Platelets: 208 10*3/uL (ref 140–400)
RBC: 4.49 10*6/uL (ref 4.20–5.80)
RDW: 11.5 % (ref 11.0–15.0)
Total Lymphocyte: 29.9 %
WBC: 4.1 10*3/uL (ref 3.8–10.8)

## 2023-05-20 NOTE — Patient Instructions (Signed)
-   Please get chest X-ray at [facility] imaging at [facility] then will call you with results.  - please schedule appointment with a dentist to evaluate dental cavities  [facility] at [phone]  Address: [address] [place] [address]
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
## 2023-05-23 ENCOUNTER — Ambulatory Visit
Admission: RE | Admit: 2023-05-23 | Discharge: 2023-05-23 | Disposition: A | Discharge status: Home / Self Care | Payer: Medicaid Other | Source: Ambulatory Visit | Attending: Family | Admitting: Family

## 2023-05-23 DIAGNOSIS — R0789 Other chest pain: Secondary | ICD-10-CM | POA: Diagnosis not present

## 2023-05-30 ENCOUNTER — Encounter: Payer: Self-pay | Admitting: Family

## 2023-05-30 ENCOUNTER — Ambulatory Visit (INDEPENDENT_AMBULATORY_CARE_PROVIDER_SITE_OTHER): Payer: Medicaid Other | Admitting: Family

## 2023-05-30 DIAGNOSIS — Z720 Tobacco use: Secondary | ICD-10-CM | POA: Diagnosis not present

## 2023-05-30 DIAGNOSIS — K219 Gastro-esophageal reflux disease without esophagitis: Secondary | ICD-10-CM | POA: Diagnosis not present

## 2023-05-30 MED ORDER — PANTOPRAZOLE SODIUM 40 MG PO TBEC: 40.0000 mg | DELAYED_RELEASE_TABLET | Freq: Every day | ORAL | 3 refills | Status: DC

## 2023-05-30 MED ORDER — PANTOPRAZOLE SODIUM 40 MG PO TBEC
40.0000 mg | DELAYED_RELEASE_TABLET | Freq: Every day | ORAL | 3 refills | Status: DC
Start: 2023-05-30 — End: 2024-06-27

## 2023-05-30 MED ORDER — BUPROPION HCL ER (SR) 150 MG PO TB12: 150.0000 mg | ORAL_TABLET | Freq: Two times a day (BID) | ORAL | 1 refills | Status: DC

## 2023-05-30 MED ORDER — FAMOTIDINE 20 MG PO TABS
20.0000 mg | ORAL_TABLET | Freq: Two times a day (BID) | ORAL | 3 refills | Status: DC
Start: 2023-05-30 — End: 2024-06-27
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
## 2023-06-03 DIAGNOSIS — Z419 Encounter for procedure for purposes other than remedying health state, unspecified: Secondary | ICD-10-CM | POA: Diagnosis not present

## 2023-07-03 DIAGNOSIS — Z419 Encounter for procedure for purposes other than remedying health state, unspecified: Secondary | ICD-10-CM | POA: Diagnosis not present

## 2023-07-07 ENCOUNTER — Other Ambulatory Visit: Payer: Self-pay | Admitting: Family

## 2023-07-07 DIAGNOSIS — Z720 Tobacco use: Secondary | ICD-10-CM

## 2023-07-07 NOTE — Telephone Encounter (Signed)
Patient is requesting a refill of the following medications: Requested Prescriptions   Pending Prescriptions Disp Refills   buPROPion (WELLBUTRIN SR) 150 MG 12 hr tablet [Pharmacy Med Name: BUPROPION HCL SR 150 MG TABLET] 180 tablet 1    Sig: TAKE 1 TABLET BY MOUTH TWICE A DAY    Date of last refill:  Refill amount:   Treatment agreement date:

## 2023-07-08 NOTE — Telephone Encounter (Signed)
Patient is requesting a refill of the following medications: Requested Prescriptions   Pending Prescriptions Disp Refills   buPROPion (WELLBUTRIN SR) 150 MG 12 hr tablet [Pharmacy Med Name: BUPROPION HCL SR 150 MG TABLET] 180 tablet 1    Sig: TAKE 1 TABLET BY MOUTH TWICE A DAY    Date of last refill:05/30/2023  Refill amount: 90 tablets 1 refill

## 2023-08-03 DIAGNOSIS — Z419 Encounter for procedure for purposes other than remedying health state, unspecified: Secondary | ICD-10-CM | POA: Diagnosis not present

## 2023-09-03 DIAGNOSIS — Z419 Encounter for procedure for purposes other than remedying health state, unspecified: Secondary | ICD-10-CM | POA: Diagnosis not present

## 2023-10-01 DIAGNOSIS — Z419 Encounter for procedure for purposes other than remedying health state, unspecified: Secondary | ICD-10-CM | POA: Diagnosis not present

## 2023-10-17 ENCOUNTER — Encounter: Payer: Medicaid Other | Admitting: Family

## 2023-10-23 NOTE — Progress Notes (Signed)
   This encounter was created in error - please disregard. No show

## 2023-11-12 DIAGNOSIS — Z419 Encounter for procedure for purposes other than remedying health state, unspecified: Secondary | ICD-10-CM | POA: Diagnosis not present

## 2023-11-17 ENCOUNTER — Telehealth: Payer: Self-pay

## 2023-11-17 NOTE — Telephone Encounter (Signed)
 Patient walked into the office about 8-53minutes before closing requesting that we typed a letter for him to return work. Per Christean Courts, NP I typed the letter for patient and had her sign it. Patient was scheduled for an appointment. Message routed to PCP [name], Elijio Guadeloupe, NP for documentation.

## 2023-11-22 ENCOUNTER — Ambulatory Visit: Admitting: Family

## 2023-12-12 DIAGNOSIS — Z419 Encounter for procedure for purposes other than remedying health state, unspecified: Secondary | ICD-10-CM | POA: Diagnosis not present

## 2023-12-16 ENCOUNTER — Encounter: Payer: Self-pay | Admitting: Family

## 2023-12-16 ENCOUNTER — Ambulatory Visit: Admitting: Family

## 2023-12-16 VITALS — BP 134/80 | HR 78 | Temp 97.6°F | Resp 20 | Ht 69.0 in | Wt 176.9 lb

## 2023-12-16 DIAGNOSIS — N5089 Other specified disorders of the male genital organs: Secondary | ICD-10-CM

## 2023-12-16 DIAGNOSIS — K219 Gastro-esophageal reflux disease without esophagitis: Secondary | ICD-10-CM

## 2023-12-16 DIAGNOSIS — Z13220 Encounter for screening for lipoid disorders: Secondary | ICD-10-CM | POA: Diagnosis not present

## 2023-12-16 DIAGNOSIS — F17209 Nicotine dependence, unspecified, with unspecified nicotine-induced disorders: Secondary | ICD-10-CM

## 2023-12-16 DIAGNOSIS — Z8619 Personal history of other infectious and parasitic diseases: Secondary | ICD-10-CM

## 2023-12-16 MED ORDER — MUPIROCIN 2 % EX OINT: 1.0000 | TOPICAL_OINTMENT | Freq: Two times a day (BID) | CUTANEOUS | 1 refills | Status: AC

## 2023-12-16 NOTE — Progress Notes (Signed)
 Provider: Christean Courts FNP-C   Branston Halsted, Elijio Guadeloupe, NP  Patient Care Team: Daana Petrasek, Elijio Guadeloupe, NP as PCP - General (Family Medicine) Veronia Goon, DO (Family Medicine) Aura Leeds, RN as Case Manager  Extended Emergency Contact Information Primary Emergency Contact: nyiramutuzo,donatha Mobile Phone: [phone] Relation: Spouse Preferred language: [place]; [place] Interpreter needed? Yes Secondary Emergency Contact: Griffine,Laquisha Mobile Phone: [phone] Relation: Other  Code Status:  Full Code  Goals of care: Advanced Directive information    12/16/2023   12:47 PM  Advanced Directives  Does Patient Have a Medical Advance Directive? No  Would patient like information on creating a medical advance directive? No - Patient declined     Chief Complaint  Patient presents with   Medical Management of Chronic Issues    6 month follow up.   Discussed the use of AI scribe software for clinical note transcription with the patient, who gave verbal consent to proceed.  History of Present Illness   [name] is a 48 year old male who presents for a six-month follow-up appointment.  He requests a letter returned to work previous letter written one month ago but states son lost it and needs a letter to sent to HR. He brought in his managers name and telephone at [facility] requesting provider to speak to manager to let them know it's okay for him to return to work since left foot wound has healed. He tried calling but his manage did not answer.   He is fasting today for blood work and confirms that he ate last night. He reports that he is eating and sleeping well.  He mentions using a triple antibiotic and Vaseline for previous left foot wound, but he started and then stopped taking it.Aaron Aas He complains of swelling on right testicle would like to be examined. Unclear how long he has had the swelling. States no fever, chills, redness or tenderness.    No  pain or swelling in his leg.    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 daily x6 months with B6 25 mg p.o. daily.  Unsure if patient completed this course.   Past Surgical History:  Procedure Laterality Date   FOOT SURGERY      No Known Allergies  Allergies as of 12/16/2023   No Known Allergies      Medication List        Accurate as of Dec 16, 2023  5:21 PM. If you have any questions, ask your nurse or doctor.          buPROPion  150 MG 12 hr tablet Commonly known as: WELLBUTRIN  SR TAKE 1 TABLET BY MOUTH TWICE A DAY   famotidine  20 MG tablet Commonly known as: PEPCID  Take 1 tablet (20 mg total) by mouth 2 (two) times daily.   ibuprofen  600 MG tablet Commonly known as: ADVIL  Take 1 tablet (600 mg total) by mouth every 6 (six) hours as needed.   pantoprazole  40 MG tablet Commonly known as: PROTONIX  Take 1 tablet (40 mg total) by mouth daily.        Review of Systems  Constitutional:  Negative for appetite change, chills, fatigue, fever and unexpected weight change.  HENT:  Negative for congestion, ear discharge, ear pain, hearing loss, nosebleeds, postnasal drip, rhinorrhea, sinus pressure, sinus pain, sneezing, sore throat, tinnitus and  trouble swallowing.   Eyes:  Negative for pain, discharge, redness, itching and visual disturbance.  Respiratory:  Negative for cough, chest tightness, shortness of breath and wheezing.   Cardiovascular:  Negative for chest pain, palpitations and leg swelling.  Gastrointestinal:  Negative for abdominal distention, abdominal pain, blood in stool, constipation, diarrhea, nausea and vomiting.  Endocrine: Negative for cold intolerance, heat intolerance, polydipsia, polyphagia and polyuria.  Genitourinary:  Negative for difficulty urinating, dysuria, flank pain, frequency, testicular pain and  urgency.       Right testicle nodule   Musculoskeletal:  Negative for arthralgias, back pain, gait problem, joint swelling, myalgias, neck pain and neck stiffness.  Skin:  Negative for color change, pallor, rash and wound.       Previous wound area scab  Neurological:  Negative for dizziness, syncope, speech difficulty, weakness, light-headedness, numbness and headaches.  Hematological:  Does not bruise/bleed easily.  Psychiatric/Behavioral:  Negative for agitation, behavioral problems, confusion, hallucinations, self-injury, sleep disturbance and suicidal ideas. The patient is not nervous/anxious.     Immunization History  Administered Date(s) Administered   Influenza, Seasonal, Injecte, Preservative Fre 04/13/2023   Tdap 12/01/2022   Pertinent  Health Maintenance Due  Topic Date Due   Colonoscopy  Never done   INFLUENZA VACCINE  03/02/2024      05/01/2022    6:39 PM 05/02/2022    8:49 AM 04/13/2023    8:42 AM 05/20/2023   11:07 AM 12/16/2023   12:46 PM  Fall Risk  Falls in the past year?   0 0 0  Was there an injury with Fall?     0  Fall Risk Category Calculator     0  (RETIRED) Patient Fall Risk Level Low fall risk Low fall risk     Patient at Risk for Falls Due to     No Fall Risks  Fall risk Follow up     Falls evaluation completed   Functional Status Survey:    Vitals:   12/16/23 1248  BP: 134/80  Pulse: 78  Resp: 20  Temp: 97.6 F (36.4 C)  SpO2: 98%  Weight: 176 lb 14.4 oz (80.2 kg)  Height: 5\' 9"  (1.753 m)   Body mass index is 26.12 kg/m. Physical Exam  GENERAL: Alert, cooperative, well developed, no acute distress. HEENT: Normocephalic, normal oropharynx, moist mucous membranes. NECK: Neck examined, inferred normal. CHEST: Clear to auscultation bilaterally, no wheezes, rhonchi, or crackles. CARDIOVASCULAR: Normal heart rate and rhythm, S1 and S2 normal without murmurs. ABDOMEN: Soft, non-tender, non-distended, without organomegaly, normal bowel  sounds. GENITOURINARY: Nodule on right testicle, ultrasound referral made. EXTREMITIES: No cyanosis, edema, or swelling. NEUROLOGICAL: Cranial nerves grossly intact, moves all extremities without gross motor or sensory deficit.  SKIN: No rash,no lesion or erythema previous left foot  dorsal area wound site skin bright pink in color with small yellow -whitish scab non-tender to touch. No drainage.  PSYCHIATRY/BEHAVIORAL: Mood stable    Labs reviewed: Recent Labs    04/13/23 0934 04/21/23 0239  NA 141 138  K 4.0 3.7  CL 114* 109  CO2 21 21*  GLUCOSE 87 100*  BUN 14 9  CREATININE 1.18 1.07  CALCIUM 8.8 8.9   Recent Labs    04/13/23 0934  AST 16  ALT 15  BILITOT 0.7  PROT 6.7   Recent Labs    04/13/23 0934 04/21/23 0239 05/20/23 1208  WBC 4.7 8.1 4.1  NEUTROABS 2,609  --  2,415  HGB 15.5 16.3 14.8  HCT 46.1 48.2 44.0  MCV 99.6 98.6 98.0  PLT 200 243 208   Lab Results  Component Value Date   TSH 0.793 04/07/2021   No results found for: "HGBA1C" Lab Results  Component Value Date   CHOL 119 12/01/2022   HDL 38 (L) 12/01/2022   LDLCALC 58 12/01/2022   TRIG 146 12/01/2022   CHOLHDL 3.1 12/01/2022    Significant Diagnostic Results in last 30 days:  No results found.  Assessment/Plan Assessment and Plan    Testicular nodule Possible nodule in the right testicle; nature to be determined. Ultrasound ordered for further evaluation to assess the need for removal or additional intervention. - Order ultrasound of the right testicle. - Refer to a specialist for potential removal based on ultrasound findings.  GERD symptoms have improved   left foot pain  wound has healed but skin pink with small, scabbed area which he has been cleaning and request antibiotic ointment to apply. Bactroban ointment ordered   Tobacco use  Smoking cessation advised   Family/ staff Communication: Reviewed plan of care with patient verbalized understanding   Labs/tests ordered:   - CBC with Differential/Platelet - CMP with eGFR([facility]) - Lipid panel - Ultrasound soft tissue right testicle    Next Appointment : Return in about 6 months (around 06/17/2024) for medical mangement of chronic issues.Aaron Aas   Spent 30 minutes of Face to face and non-face to face with patient  >50% time spent counseling; reviewing medical record; tests; labs; documentation and developing future plan of care.   Estil Heman, NP

## 2023-12-17 LAB — CBC WITH DIFFERENTIAL/PLATELET
Absolute Lymphocytes: 1280 {cells}/uL (ref 850–3900)
Absolute Monocytes: 260 {cells}/uL (ref 200–950)
Basophils Absolute: 20 {cells}/uL (ref 0–200)
Basophils Relative: 0.5 %
Eosinophils Absolute: 72 {cells}/uL (ref 15–500)
Eosinophils Relative: 1.8 %
HCT: 47.2 % (ref 38.5–50.0)
Hemoglobin: 15.9 g/dL (ref 13.2–17.1)
MCH: 33.6 pg — ABNORMAL HIGH (ref 27.0–33.0)
MCHC: 33.7 g/dL (ref 32.0–36.0)
MCV: 99.8 fL (ref 80.0–100.0)
MPV: 10 fL (ref 7.5–12.5)
Monocytes Relative: 6.5 %
Neutro Abs: 2368 {cells}/uL (ref 1500–7800)
Neutrophils Relative %: 59.2 %
Platelets: 194 10*3/uL (ref 140–400)
RBC: 4.73 10*6/uL (ref 4.20–5.80)
RDW: 11.7 % (ref 11.0–15.0)
Total Lymphocyte: 32 %
WBC: 4 10*3/uL (ref 3.8–10.8)

## 2023-12-17 LAB — COMPLETE METABOLIC PANEL WITHOUT GFR
AG Ratio: 1.6 (calc) (ref 1.0–2.5)
ALT: 21 U/L (ref 9–46)
AST: 19 U/L (ref 10–35)
Albumin: 4.2 g/dL (ref 3.6–5.1)
Alkaline phosphatase (APISO): 77 U/L (ref 35–144)
BUN: 10 mg/dL (ref 7–25)
CO2: 21 mmol/L (ref 20–32)
Calcium: 9.5 mg/dL (ref 8.6–10.3)
Chloride: 113 mmol/L — ABNORMAL HIGH (ref 98–110)
Creat: 0.99 mg/dL (ref 0.70–1.30)
Globulin: 2.6 g/dL (ref 1.9–3.7)
Glucose, Bld: 99 mg/dL (ref 65–99)
Potassium: 3.9 mmol/L (ref 3.5–5.3)
Sodium: 140 mmol/L (ref 135–146)
Total Bilirubin: 0.6 mg/dL (ref 0.2–1.2)
Total Protein: 6.8 g/dL (ref 6.1–8.1)

## 2023-12-17 LAB — LIPID PANEL
Cholesterol: 128 mg/dL (ref <200)
HDL: 35 mg/dL — ABNORMAL LOW (ref >=40)
LDL Cholesterol (Calc): 69 mg/dL
Non-HDL Cholesterol (Calc): 93 mg/dL (ref <130)
Total CHOL/HDL Ratio: 3.7 (calc) (ref <5.0)
Triglycerides: 166 mg/dL — ABNORMAL HIGH (ref <150)

## 2023-12-19 ENCOUNTER — Ambulatory Visit: Payer: Self-pay | Admitting: Family

## 2023-12-28 ENCOUNTER — Ambulatory Visit
Admission: RE | Admit: 2023-12-28 | Discharge: 2023-12-28 | Discharge status: Home / Self Care | Source: Ambulatory Visit | Attending: Family | Admitting: Family

## 2023-12-28 DIAGNOSIS — N5089 Other specified disorders of the male genital organs: Secondary | ICD-10-CM

## 2024-01-12 DIAGNOSIS — Z419 Encounter for procedure for purposes other than remedying health state, unspecified: Secondary | ICD-10-CM | POA: Diagnosis not present

## 2024-02-11 DIAGNOSIS — Z419 Encounter for procedure for purposes other than remedying health state, unspecified: Secondary | ICD-10-CM | POA: Diagnosis not present

## 2024-03-13 DIAGNOSIS — Z419 Encounter for procedure for purposes other than remedying health state, unspecified: Secondary | ICD-10-CM | POA: Diagnosis not present

## 2024-03-16 ENCOUNTER — Encounter (HOSPITAL_BASED_OUTPATIENT_CLINIC_OR_DEPARTMENT_OTHER): Attending: Internal Medicine | Admitting: Internal Medicine

## 2024-03-16 DIAGNOSIS — L97522 Non-pressure chronic ulcer of other part of left foot with fat layer exposed: Secondary | ICD-10-CM | POA: Diagnosis not present

## 2024-03-16 DIAGNOSIS — I87312 Chronic venous hypertension (idiopathic) with ulcer of left lower extremity: Secondary | ICD-10-CM | POA: Insufficient documentation

## 2024-03-23 ENCOUNTER — Encounter (HOSPITAL_BASED_OUTPATIENT_CLINIC_OR_DEPARTMENT_OTHER): Admitting: Internal Medicine

## 2024-03-29 ENCOUNTER — Encounter (HOSPITAL_BASED_OUTPATIENT_CLINIC_OR_DEPARTMENT_OTHER): Admitting: Internal Medicine

## 2024-03-29 DIAGNOSIS — L97522 Non-pressure chronic ulcer of other part of left foot with fat layer exposed: Secondary | ICD-10-CM

## 2024-03-29 DIAGNOSIS — I87312 Chronic venous hypertension (idiopathic) with ulcer of left lower extremity: Secondary | ICD-10-CM

## 2024-04-06 ENCOUNTER — Encounter (HOSPITAL_BASED_OUTPATIENT_CLINIC_OR_DEPARTMENT_OTHER): Attending: Internal Medicine | Admitting: Internal Medicine

## 2024-04-06 DIAGNOSIS — L97522 Non-pressure chronic ulcer of other part of left foot with fat layer exposed: Secondary | ICD-10-CM | POA: Insufficient documentation

## 2024-04-06 DIAGNOSIS — I87312 Chronic venous hypertension (idiopathic) with ulcer of left lower extremity: Secondary | ICD-10-CM | POA: Insufficient documentation

## 2024-04-13 ENCOUNTER — Encounter (HOSPITAL_BASED_OUTPATIENT_CLINIC_OR_DEPARTMENT_OTHER): Admitting: Internal Medicine

## 2024-04-13 DIAGNOSIS — I87312 Chronic venous hypertension (idiopathic) with ulcer of left lower extremity: Secondary | ICD-10-CM | POA: Diagnosis not present

## 2024-04-13 DIAGNOSIS — L97522 Non-pressure chronic ulcer of other part of left foot with fat layer exposed: Secondary | ICD-10-CM

## 2024-04-13 DIAGNOSIS — Z419 Encounter for procedure for purposes other than remedying health state, unspecified: Secondary | ICD-10-CM | POA: Diagnosis not present

## 2024-04-23 ENCOUNTER — Encounter (HOSPITAL_BASED_OUTPATIENT_CLINIC_OR_DEPARTMENT_OTHER): Admitting: Internal Medicine

## 2024-04-23 DIAGNOSIS — I87312 Chronic venous hypertension (idiopathic) with ulcer of left lower extremity: Secondary | ICD-10-CM

## 2024-04-23 DIAGNOSIS — L97522 Non-pressure chronic ulcer of other part of left foot with fat layer exposed: Secondary | ICD-10-CM | POA: Diagnosis not present

## 2024-04-30 ENCOUNTER — Encounter (HOSPITAL_BASED_OUTPATIENT_CLINIC_OR_DEPARTMENT_OTHER): Admitting: Internal Medicine

## 2024-04-30 DIAGNOSIS — L97522 Non-pressure chronic ulcer of other part of left foot with fat layer exposed: Secondary | ICD-10-CM

## 2024-04-30 DIAGNOSIS — I87312 Chronic venous hypertension (idiopathic) with ulcer of left lower extremity: Secondary | ICD-10-CM

## 2024-05-07 ENCOUNTER — Encounter (HOSPITAL_BASED_OUTPATIENT_CLINIC_OR_DEPARTMENT_OTHER): Attending: Internal Medicine | Admitting: Internal Medicine

## 2024-05-07 DIAGNOSIS — L97522 Non-pressure chronic ulcer of other part of left foot with fat layer exposed: Secondary | ICD-10-CM | POA: Diagnosis not present

## 2024-05-07 DIAGNOSIS — I87312 Chronic venous hypertension (idiopathic) with ulcer of left lower extremity: Secondary | ICD-10-CM | POA: Insufficient documentation

## 2024-05-13 DIAGNOSIS — Z419 Encounter for procedure for purposes other than remedying health state, unspecified: Secondary | ICD-10-CM | POA: Diagnosis not present

## 2024-05-14 ENCOUNTER — Encounter (HOSPITAL_BASED_OUTPATIENT_CLINIC_OR_DEPARTMENT_OTHER): Admitting: Internal Medicine

## 2024-05-14 DIAGNOSIS — L97522 Non-pressure chronic ulcer of other part of left foot with fat layer exposed: Secondary | ICD-10-CM

## 2024-05-14 DIAGNOSIS — I87312 Chronic venous hypertension (idiopathic) with ulcer of left lower extremity: Secondary | ICD-10-CM

## 2024-05-21 ENCOUNTER — Encounter (HOSPITAL_BASED_OUTPATIENT_CLINIC_OR_DEPARTMENT_OTHER): Admitting: Internal Medicine

## 2024-05-21 DIAGNOSIS — L97522 Non-pressure chronic ulcer of other part of left foot with fat layer exposed: Secondary | ICD-10-CM | POA: Diagnosis not present

## 2024-05-21 DIAGNOSIS — I87312 Chronic venous hypertension (idiopathic) with ulcer of left lower extremity: Secondary | ICD-10-CM | POA: Diagnosis not present

## 2024-05-28 ENCOUNTER — Encounter (HOSPITAL_BASED_OUTPATIENT_CLINIC_OR_DEPARTMENT_OTHER): Admitting: Internal Medicine

## 2024-05-28 DIAGNOSIS — I87312 Chronic venous hypertension (idiopathic) with ulcer of left lower extremity: Secondary | ICD-10-CM | POA: Diagnosis not present

## 2024-05-28 DIAGNOSIS — L97522 Non-pressure chronic ulcer of other part of left foot with fat layer exposed: Secondary | ICD-10-CM

## 2024-06-05 ENCOUNTER — Encounter (HOSPITAL_BASED_OUTPATIENT_CLINIC_OR_DEPARTMENT_OTHER): Attending: Internal Medicine | Admitting: Internal Medicine

## 2024-06-05 DIAGNOSIS — L97522 Non-pressure chronic ulcer of other part of left foot with fat layer exposed: Secondary | ICD-10-CM | POA: Diagnosis not present

## 2024-06-05 DIAGNOSIS — I87312 Chronic venous hypertension (idiopathic) with ulcer of left lower extremity: Secondary | ICD-10-CM | POA: Insufficient documentation

## 2024-06-13 ENCOUNTER — Encounter (HOSPITAL_BASED_OUTPATIENT_CLINIC_OR_DEPARTMENT_OTHER): Admitting: Internal Medicine

## 2024-06-13 DIAGNOSIS — L97522 Non-pressure chronic ulcer of other part of left foot with fat layer exposed: Secondary | ICD-10-CM

## 2024-06-13 DIAGNOSIS — I87312 Chronic venous hypertension (idiopathic) with ulcer of left lower extremity: Secondary | ICD-10-CM

## 2024-06-18 ENCOUNTER — Ambulatory Visit: Payer: Self-pay | Admitting: Family

## 2024-06-21 ENCOUNTER — Encounter (HOSPITAL_BASED_OUTPATIENT_CLINIC_OR_DEPARTMENT_OTHER): Admitting: Internal Medicine

## 2024-06-21 DIAGNOSIS — L97522 Non-pressure chronic ulcer of other part of left foot with fat layer exposed: Secondary | ICD-10-CM | POA: Diagnosis not present

## 2024-06-21 DIAGNOSIS — I87312 Chronic venous hypertension (idiopathic) with ulcer of left lower extremity: Secondary | ICD-10-CM | POA: Diagnosis not present

## 2024-06-22 ENCOUNTER — Ambulatory Visit: Admitting: Family

## 2024-06-27 ENCOUNTER — Encounter: Payer: Self-pay | Admitting: Family

## 2024-06-27 ENCOUNTER — Encounter (HOSPITAL_BASED_OUTPATIENT_CLINIC_OR_DEPARTMENT_OTHER): Admitting: Internal Medicine

## 2024-06-27 ENCOUNTER — Ambulatory Visit: Admitting: Family

## 2024-06-27 VITALS — BP 136/82 | HR 68 | Temp 98.2°F | Resp 20 | Ht 69.0 in | Wt 166.8 lb

## 2024-06-27 DIAGNOSIS — K219 Gastro-esophageal reflux disease without esophagitis: Secondary | ICD-10-CM | POA: Diagnosis not present

## 2024-06-27 DIAGNOSIS — J069 Acute upper respiratory infection, unspecified: Secondary | ICD-10-CM

## 2024-06-27 DIAGNOSIS — E782 Mixed hyperlipidemia: Secondary | ICD-10-CM

## 2024-06-27 DIAGNOSIS — S81802D Unspecified open wound, left lower leg, subsequent encounter: Secondary | ICD-10-CM | POA: Diagnosis not present

## 2024-06-27 LAB — POCT INFLUENZA A/B
Influenza A, POC: NEGATIVE
Influenza B, POC: NEGATIVE

## 2024-06-27 MED ORDER — LORATADINE 10 MG PO TABS: 10.0000 mg | ORAL_TABLET | Freq: Every day | ORAL | 11 refills | Status: AC

## 2024-06-27 MED ORDER — VITAMIN C 1000 MG PO TABS: 1000.0000 mg | ORAL_TABLET | Freq: Every day | ORAL | 1 refills | Status: AC

## 2024-06-27 MED ORDER — VITAMIN D3 50 MCG (2000 UT) PO CAPS
2000.0000 [IU] | ORAL_CAPSULE | Freq: Every day | ORAL | 1 refills | Status: AC
Start: 2024-06-27 — End: ?

## 2024-06-27 MED ORDER — ZINC GLUCONATE 50 MG PO TABS: 50.0000 mg | ORAL_TABLET | Freq: Every day | ORAL | 0 refills | Status: AC

## 2024-06-27 NOTE — Progress Notes (Signed)
 Provider: Roxan Plough FNP-C   Cully Luckow, Roxan BROCKS, NP  Patient Care Team: Arihana Ambrocio, Roxan BROCKS, NP as PCP - General (Family Medicine) Orlando Pond, DO (Inactive) (Family Medicine) Lucky Andrea LABOR, RN as Case Manager  Extended Emergency Contact Information Primary Emergency Contact: nyiramutuzo,donatha Mobile Phone: [phone] Relation: Spouse Preferred language: Cheryl; [place] Interpreter needed? Yes Secondary Emergency Contact: Griffine,Laquisha Mobile Phone: [phone] Relation: Other  Code Status:  Full Code  Goals of care: Advanced Directive information    06/27/2024    2:40 PM  Advanced Directives  Does Patient Have a Medical Advance Directive? No  Would patient like information on creating a medical advance directive? No - Patient declined     Chief Complaint  Patient presents with   Medical Management of Chronic Issues    6 Month follow up.    Discussed the use of AI scribe software for clinical note transcription with the patient, who gave verbal consent to proceed.  History of Present Illness   Romey Stash is a 48 year old male who presents with fever, chills, and body aches for 2 days.  He has been experiencing fever, chills, and body aches for the past few days, accompanied by a runny nose and muscle pain in his legs. He has not taken any medication like Tylenol  to alleviate these symptoms.  He reports a lack of appetite and has not had a bowel movement for the past two days, which he attributes to not eating. No nausea, vomiting, or diarrhea.  His symptoms began after his wife fell ill. Despite these symptoms, he has continued to go to work.  He has a history of a left lower leg wound that has reopened, and he is scheduled to follow up with a wound care specialist later today.   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 daily x6 months with B6 25 mg p.o. daily.  Unsure if patient completed this course.   Past Surgical History:  Procedure Laterality Date   FOOT SURGERY      No Known Allergies  Allergies as of 06/27/2024   No Known Allergies      Medication List        Accurate as of June 27, 2024  4:03 PM. If you have any questions, ask your nurse or doctor.          STOP taking these medications    buPROPion  150 MG 12 hr tablet Commonly known as: WELLBUTRIN  SR Stopped by: Roxan BROCKS Parys Elenbaas   famotidine  20 MG tablet Commonly known as: PEPCID  Stopped by: Brytnee Bechler C Fara Worthy   ibuprofen  600 MG tablet Commonly known as: ADVIL  Stopped by: Antonyo Hinderer C Jaden Abreu   pantoprazole  40 MG tablet Commonly known as: PROTONIX  Stopped by: Malley Hauter C Marnette Perkins       TAKE these medications    loratadine  10 MG tablet Commonly known as: CLARITIN  Take 1 tablet (10 mg total) by mouth daily. Started by: Yee Joss C Shania Bjelland   mupirocin  ointment 2 % Commonly known as: BACTROBAN  Apply 1 Application topically 2 (two) times daily. Apply to affected area on left foot   vitamin C  1000 MG tablet Take 1 tablet (1,000 mg total) by mouth daily. Started by: Janace Decker C Nyzaiah Kai   Vitamin D3 50 MCG (2000 UT) capsule Take 1 capsule (2,000 Units total) by mouth daily.  Started by: Sharley Keeler C Jayveon Convey   zinc  gluconate 50 MG tablet Take 1 tablet (50 mg total) by mouth daily for 14 days. Started by: Roxan BROCKS Jaykwon Morones        Review of Systems  Constitutional:  Positive for appetite change, chills and fatigue. Negative for fever and unexpected weight change.       Generalized muscle aches  HENT:  Positive for rhinorrhea, sinus pain and sneezing. Negative for congestion, dental problem, ear discharge, ear pain, facial swelling, hearing loss, nosebleeds, postnasal drip, sinus pressure, sore throat, tinnitus and trouble swallowing.   Eyes:  Negative for pain, discharge, redness, itching and visual disturbance.   Respiratory:  Negative for cough, chest tightness, shortness of breath and wheezing.   Cardiovascular:  Negative for chest pain, palpitations and leg swelling.  Gastrointestinal:  Negative for abdominal distention, abdominal pain, blood in stool, diarrhea, nausea and vomiting.       No bowel movement for the past 2 days though has had poor oral intake   Endocrine: Negative for cold intolerance, heat intolerance, polydipsia, polyphagia and polyuria.  Genitourinary:  Negative for difficulty urinating, dysuria, flank pain, frequency and urgency.  Musculoskeletal:  Negative for arthralgias, back pain, gait problem, joint swelling, myalgias, neck pain and neck stiffness.  Skin:  Negative for color change, pallor, rash and wound.  Neurological:  Negative for dizziness, syncope, speech difficulty, weakness, light-headedness, numbness and headaches.  Hematological:  Does not bruise/bleed easily.  Psychiatric/Behavioral:  Negative for agitation, behavioral problems, confusion, hallucinations, self-injury, sleep disturbance and suicidal ideas. The patient is not nervous/anxious.     Immunization History  Administered Date(s) Administered   Influenza, Seasonal, Injecte, Preservative Fre 04/13/2023   Tdap 12/01/2022   Pertinent  Health Maintenance Due  Topic Date Due   Colonoscopy  Never done   Influenza Vaccine  07/17/2024 (Originally 03/02/2024)      05/02/2022    8:49 AM 04/13/2023    8:42 AM 05/20/2023   11:07 AM 12/16/2023   12:46 PM 06/27/2024    2:39 PM  Fall Risk  Falls in the past year?  0 0 0 0  Was there an injury with Fall?    0 0  Fall Risk Category Calculator    0 0  (RETIRED) Patient Fall Risk Level Low fall risk       Patient at Risk for Falls Due to    No Fall Risks No Fall Risks  Fall risk Follow up    Falls evaluation completed Falls evaluation completed     Data saved with a previous flowsheet row definition   Functional Status Survey:    Vitals:   06/27/24 1443  BP:  136/82  Pulse: 68  Resp: 20  Temp: 98.2 F (36.8 C)  SpO2: 99%  Weight: 166 lb 12.8 oz (75.7 kg)  Height: 5' 9 (1.753 m)   Body mass index is 24.63 kg/m. Physical Exam  VITALS: T- 98.2, P- 68, BP- 136/82, SaO2- 99% MEASUREMENTS: Weight- 166.8. GENERAL: Alert, cooperative, well developed, no acute distress HEENT: Normocephalic, normal oropharynx, moist mucous membranes, Rhinorrhea with bilateral sinus tenderness, eyes normal CHEST: Clear to auscultation bilaterally, no wheezes, rhonchi, or crackles CARDIOVASCULAR: Normal heart rate and rhythm, S1 and S2 normal without murmurs ABDOMEN: Soft, non-tender, non-distended, without organomegaly, normal bowel sounds EXTREMITIES: No cyanosis, edema, or swelling in the legs NEUROLOGICAL: Cranial nerves grossly intact, moves all extremities without gross motor or sensory deficit SKIN: Left lower leg wound not visualized has wrap from foot  to below the knee. Dressing dry and clean and intact. PYSCHIATRY: Mood stable   Labs reviewed: Recent Labs    12/16/23 1322  NA 140  K 3.9  CL 113*  CO2 21  GLUCOSE 99  BUN 10  CREATININE 0.99  CALCIUM 9.5   Recent Labs    12/16/23 1322  AST 19  ALT 21  BILITOT 0.6  PROT 6.8   Recent Labs    12/16/23 1322  WBC 4.0  NEUTROABS 2,368  HGB 15.9  HCT 47.2  MCV 99.8  PLT 194   Lab Results  Component Value Date   TSH 0.793 04/07/2021   No results found for: HGBA1C Lab Results  Component Value Date   CHOL 128 12/16/2023   HDL 35 (L) 12/16/2023   LDLCALC 69 12/16/2023   TRIG 166 (H) 12/16/2023   CHOLHDL 3.7 12/16/2023    Significant Diagnostic Results in last 30 days:  No results found.  Assessment/Plan  Acute upper respiratory infection Symptoms include fever, chills, body aches, runny nose, and loss of appetite for two days. No cough, nausea, vomiting, or diarrhea. Negative influenza test. Awaiting COVID test results. Possible viral etiology given symptoms and recent  exposure to wife who was sick. - Sent to [facility] on [address] for [employer].Copy of lab address provided and also written on AVS. - Prescribed loratadine  10 mg orally daily for two weeks for runny nose. - Prescribed vitamin D 2000 units orally daily. - Prescribed zinc  50 mg orally daily for two weeks. - Prescribed vitamin C  1000 mg daily for two weeks for supportive care. - Provided samples of Tylenol  extra strength 500 mg to take orally every eight hours as needed for muscle aches, body aches, and fever. - Encouraged increased fluid intake and reduction of juice consumption to decrease sugar intake. - If COVID test is positive, will prescribe Paxlovid.  Mixed hyperlipidemia Previous LDL at goal TRG elevated with low HDL level  - dietary modification advised  - Ordered lipid panel as part of lab work.   GERD - symptoms resolved.Previously treated for H.Pylori  - current off PPI   Left Lower leg wound  - Dressing intact and dry during visit wound not visualized.Has appointment with wound center later today.   Family/ staff Communication: Reviewed plan of care with patient verbalized understanding via Interpreter  Labs/tests ordered:  - CBC with Differential/Platelet - CMP with eGFR([facility]) - Lipid panel  Next Appointment : Return in about 6 months (around 12/25/2024) for medical mangement of chronic issues.SABRA   Spent 30 minutes of Face to face and non-face to face with patient  >50% time spent counseling; reviewing medical record; tests; labs; documentation and developing future plan of care.   Roxan JAYSON Plough, NP

## 2024-06-27 NOTE — Patient Instructions (Addendum)
 1.Report to local pharmacy to receive Covid,Shingles,Hepatitis B,& Pneumococcal Vaccines.  - Take provider Tylenol  500 mg tablet one by mouth every 8 hrs as needed for pain or muscle aches   - Please get COVID-19 testing at [address] [place] ,[place] [address]

## 2024-06-28 LAB — COMPLETE METABOLIC PANEL WITHOUT GFR
AG Ratio: 1.8 (calc) (ref 1.0–2.5)
ALT: 18 U/L (ref 9–46)
AST: 22 U/L (ref 10–35)
Albumin: 4.1 g/dL (ref 3.6–5.1)
Alkaline phosphatase (APISO): 76 U/L (ref 35–144)
BUN: 14 mg/dL (ref 7–25)
CO2: 21 mmol/L (ref 20–32)
Calcium: 9.2 mg/dL (ref 8.6–10.3)
Chloride: 114 mmol/L — ABNORMAL HIGH (ref 98–110)
Creat: 0.96 mg/dL (ref 0.70–1.30)
Globulin: 2.3 g/dL (ref 1.9–3.7)
Glucose, Bld: 86 mg/dL (ref 65–139)
Potassium: 4 mmol/L (ref 3.5–5.3)
Sodium: 142 mmol/L (ref 135–146)
Total Bilirubin: 0.4 mg/dL (ref 0.2–1.2)
Total Protein: 6.4 g/dL (ref 6.1–8.1)

## 2024-06-28 LAB — CBC WITH DIFFERENTIAL/PLATELET
Absolute Lymphocytes: 1620 {cells}/uL (ref 850–3900)
Absolute Monocytes: 398 {cells}/uL (ref 200–950)
Basophils Absolute: 21 {cells}/uL (ref 0–200)
Basophils Relative: 0.5 %
Eosinophils Absolute: 78 {cells}/uL (ref 15–500)
Eosinophils Relative: 1.9 %
HCT: 42.9 % (ref 39.4–51.1)
Hemoglobin: 14.2 g/dL (ref 13.2–17.1)
MCH: 32.1 pg (ref 27.0–33.0)
MCHC: 33.1 g/dL (ref 31.6–35.4)
MCV: 96.8 fL (ref 81.4–101.7)
MPV: 10.4 fL (ref 7.5–12.5)
Monocytes Relative: 9.7 %
Neutro Abs: 1984 {cells}/uL (ref 1500–7800)
Neutrophils Relative %: 48.4 %
Platelets: 223 Thousand/uL (ref 140–400)
RBC: 4.43 Million/uL (ref 4.20–5.80)
RDW: 11.3 % (ref 11.0–15.0)
Total Lymphocyte: 39.5 %
WBC: 4.1 Thousand/uL (ref 3.8–10.8)

## 2024-06-28 LAB — LIPID PANEL
Cholesterol: 99 mg/dL (ref <200)
HDL: 34 mg/dL — ABNORMAL LOW (ref >=40)
LDL Cholesterol (Calc): 47 mg/dL
Non-HDL Cholesterol (Calc): 65 mg/dL (ref <130)
Total CHOL/HDL Ratio: 2.9 (calc) (ref <5.0)
Triglycerides: 99 mg/dL (ref <150)

## 2024-07-02 ENCOUNTER — Encounter (HOSPITAL_BASED_OUTPATIENT_CLINIC_OR_DEPARTMENT_OTHER): Admitting: Internal Medicine

## 2024-07-02 DIAGNOSIS — I87312 Chronic venous hypertension (idiopathic) with ulcer of left lower extremity: Secondary | ICD-10-CM | POA: Diagnosis not present

## 2024-07-02 DIAGNOSIS — L97522 Non-pressure chronic ulcer of other part of left foot with fat layer exposed: Secondary | ICD-10-CM | POA: Diagnosis not present

## 2024-07-06 ENCOUNTER — Ambulatory Visit: Payer: Self-pay | Admitting: Family

## 2024-07-09 ENCOUNTER — Encounter (HOSPITAL_BASED_OUTPATIENT_CLINIC_OR_DEPARTMENT_OTHER): Admitting: Internal Medicine

## 2024-07-09 DIAGNOSIS — L97522 Non-pressure chronic ulcer of other part of left foot with fat layer exposed: Secondary | ICD-10-CM | POA: Diagnosis not present

## 2024-07-09 DIAGNOSIS — I87312 Chronic venous hypertension (idiopathic) with ulcer of left lower extremity: Secondary | ICD-10-CM | POA: Diagnosis not present

## 2024-07-16 ENCOUNTER — Encounter (HOSPITAL_BASED_OUTPATIENT_CLINIC_OR_DEPARTMENT_OTHER): Admitting: Internal Medicine

## 2024-07-16 DIAGNOSIS — L97522 Non-pressure chronic ulcer of other part of left foot with fat layer exposed: Secondary | ICD-10-CM | POA: Diagnosis not present

## 2024-07-16 DIAGNOSIS — I87312 Chronic venous hypertension (idiopathic) with ulcer of left lower extremity: Secondary | ICD-10-CM | POA: Diagnosis not present

## 2024-07-23 ENCOUNTER — Encounter (HOSPITAL_BASED_OUTPATIENT_CLINIC_OR_DEPARTMENT_OTHER): Admitting: General Surgery

## 2024-07-30 ENCOUNTER — Encounter (HOSPITAL_BASED_OUTPATIENT_CLINIC_OR_DEPARTMENT_OTHER): Admitting: Internal Medicine

## 2024-07-30 DIAGNOSIS — I87312 Chronic venous hypertension (idiopathic) with ulcer of left lower extremity: Secondary | ICD-10-CM | POA: Diagnosis not present

## 2024-07-30 DIAGNOSIS — L97522 Non-pressure chronic ulcer of other part of left foot with fat layer exposed: Secondary | ICD-10-CM | POA: Diagnosis not present

## 2024-08-07 ENCOUNTER — Encounter (HOSPITAL_BASED_OUTPATIENT_CLINIC_OR_DEPARTMENT_OTHER): Attending: Internal Medicine | Admitting: Internal Medicine

## 2024-08-07 DIAGNOSIS — L97522 Non-pressure chronic ulcer of other part of left foot with fat layer exposed: Secondary | ICD-10-CM | POA: Insufficient documentation

## 2024-08-07 DIAGNOSIS — I87312 Chronic venous hypertension (idiopathic) with ulcer of left lower extremity: Secondary | ICD-10-CM | POA: Insufficient documentation

## 2024-08-07 DIAGNOSIS — F17200 Nicotine dependence, unspecified, uncomplicated: Secondary | ICD-10-CM | POA: Insufficient documentation

## 2024-08-14 ENCOUNTER — Encounter (HOSPITAL_BASED_OUTPATIENT_CLINIC_OR_DEPARTMENT_OTHER): Admitting: Internal Medicine

## 2024-08-16 ENCOUNTER — Encounter (HOSPITAL_BASED_OUTPATIENT_CLINIC_OR_DEPARTMENT_OTHER): Admitting: Internal Medicine

## 2024-08-16 DIAGNOSIS — I87312 Chronic venous hypertension (idiopathic) with ulcer of left lower extremity: Secondary | ICD-10-CM | POA: Diagnosis not present

## 2024-08-16 DIAGNOSIS — L97522 Non-pressure chronic ulcer of other part of left foot with fat layer exposed: Secondary | ICD-10-CM

## 2024-08-16 DIAGNOSIS — F17200 Nicotine dependence, unspecified, uncomplicated: Secondary | ICD-10-CM | POA: Diagnosis not present

## 2024-08-23 ENCOUNTER — Encounter (HOSPITAL_BASED_OUTPATIENT_CLINIC_OR_DEPARTMENT_OTHER): Admitting: Internal Medicine

## 2024-08-23 DIAGNOSIS — L97522 Non-pressure chronic ulcer of other part of left foot with fat layer exposed: Secondary | ICD-10-CM | POA: Diagnosis not present

## 2024-08-23 DIAGNOSIS — I87312 Chronic venous hypertension (idiopathic) with ulcer of left lower extremity: Secondary | ICD-10-CM | POA: Diagnosis not present

## 2024-08-30 ENCOUNTER — Encounter (HOSPITAL_BASED_OUTPATIENT_CLINIC_OR_DEPARTMENT_OTHER): Admitting: Internal Medicine

## 2024-08-30 DIAGNOSIS — I87312 Chronic venous hypertension (idiopathic) with ulcer of left lower extremity: Secondary | ICD-10-CM

## 2024-08-30 DIAGNOSIS — L97522 Non-pressure chronic ulcer of other part of left foot with fat layer exposed: Secondary | ICD-10-CM | POA: Diagnosis not present

## 2024-09-07 ENCOUNTER — Encounter (HOSPITAL_BASED_OUTPATIENT_CLINIC_OR_DEPARTMENT_OTHER): Admitting: Internal Medicine

## 2024-09-13 ENCOUNTER — Encounter (HOSPITAL_BASED_OUTPATIENT_CLINIC_OR_DEPARTMENT_OTHER): Admitting: General Surgery

## 2024-12-25 ENCOUNTER — Ambulatory Visit: Admitting: Family
